# Patient Record
Sex: Female | Born: 1960 | Race: White | Hispanic: No | Smoking: Former smoker
Health system: Southern US, Community
[De-identification: ages and names within clinical notes are randomized; demographics above are authoritative.]

## PROBLEM LIST (undated history)

## (undated) DIAGNOSIS — E871 Hypo-osmolality and hyponatremia: Secondary | ICD-10-CM

## (undated) DIAGNOSIS — T8859XA Other complications of anesthesia, initial encounter: Secondary | ICD-10-CM

## (undated) DIAGNOSIS — M8580 Other specified disorders of bone density and structure, unspecified site: Secondary | ICD-10-CM

## (undated) DIAGNOSIS — K259 Gastric ulcer, unspecified as acute or chronic, without hemorrhage or perforation: Secondary | ICD-10-CM

## (undated) DIAGNOSIS — I8390 Asymptomatic varicose veins of unspecified lower extremity: Secondary | ICD-10-CM

## (undated) DIAGNOSIS — E559 Vitamin D deficiency, unspecified: Secondary | ICD-10-CM

## (undated) DIAGNOSIS — Z8589 Personal history of malignant neoplasm of other organs and systems: Secondary | ICD-10-CM

## (undated) DIAGNOSIS — F329 Major depressive disorder, single episode, unspecified: Secondary | ICD-10-CM

## (undated) DIAGNOSIS — Z8489 Family history of other specified conditions: Secondary | ICD-10-CM

## (undated) DIAGNOSIS — F32A Depression, unspecified: Secondary | ICD-10-CM

## (undated) DIAGNOSIS — E785 Hyperlipidemia, unspecified: Secondary | ICD-10-CM

## (undated) DIAGNOSIS — Z860100 Personal history of colon polyps, unspecified: Secondary | ICD-10-CM

## (undated) DIAGNOSIS — M797 Fibromyalgia: Secondary | ICD-10-CM

## (undated) DIAGNOSIS — N952 Postmenopausal atrophic vaginitis: Secondary | ICD-10-CM

## (undated) DIAGNOSIS — M722 Plantar fascial fibromatosis: Secondary | ICD-10-CM

## (undated) DIAGNOSIS — Z720 Tobacco use: Secondary | ICD-10-CM

## (undated) DIAGNOSIS — D126 Benign neoplasm of colon, unspecified: Secondary | ICD-10-CM

## (undated) DIAGNOSIS — I700 Atherosclerosis of aorta: Secondary | ICD-10-CM

## (undated) DIAGNOSIS — C8590 Non-Hodgkin lymphoma, unspecified, unspecified site: Secondary | ICD-10-CM

## (undated) DIAGNOSIS — J45909 Unspecified asthma, uncomplicated: Secondary | ICD-10-CM

## (undated) DIAGNOSIS — F419 Anxiety disorder, unspecified: Secondary | ICD-10-CM

## (undated) DIAGNOSIS — K6389 Other specified diseases of intestine: Secondary | ICD-10-CM

## (undated) DIAGNOSIS — M1990 Unspecified osteoarthritis, unspecified site: Secondary | ICD-10-CM

## (undated) DIAGNOSIS — K5790 Diverticulosis of intestine, part unspecified, without perforation or abscess without bleeding: Secondary | ICD-10-CM

## (undated) DIAGNOSIS — Z8719 Personal history of other diseases of the digestive system: Secondary | ICD-10-CM

## (undated) DIAGNOSIS — J449 Chronic obstructive pulmonary disease, unspecified: Secondary | ICD-10-CM

## (undated) DIAGNOSIS — I10 Essential (primary) hypertension: Secondary | ICD-10-CM

## (undated) DIAGNOSIS — J387 Other diseases of larynx: Secondary | ICD-10-CM

## (undated) DIAGNOSIS — G4700 Insomnia, unspecified: Secondary | ICD-10-CM

## (undated) DIAGNOSIS — U071 COVID-19: Secondary | ICD-10-CM

## (undated) DIAGNOSIS — Z87442 Personal history of urinary calculi: Secondary | ICD-10-CM

## (undated) DIAGNOSIS — J439 Emphysema, unspecified: Secondary | ICD-10-CM

## (undated) DIAGNOSIS — N182 Chronic kidney disease, stage 2 (mild): Secondary | ICD-10-CM

## (undated) DIAGNOSIS — M542 Cervicalgia: Secondary | ICD-10-CM

## (undated) DIAGNOSIS — K219 Gastro-esophageal reflux disease without esophagitis: Secondary | ICD-10-CM

## (undated) DIAGNOSIS — F909 Attention-deficit hyperactivity disorder, unspecified type: Secondary | ICD-10-CM

## (undated) DIAGNOSIS — T7840XA Allergy, unspecified, initial encounter: Secondary | ICD-10-CM

## (undated) DIAGNOSIS — R0600 Dyspnea, unspecified: Secondary | ICD-10-CM

## (undated) DIAGNOSIS — M810 Age-related osteoporosis without current pathological fracture: Secondary | ICD-10-CM

## (undated) HISTORY — DX: Diverticulosis of intestine, part unspecified, without perforation or abscess without bleeding: K57.90

## (undated) HISTORY — DX: Vitamin D deficiency, unspecified: E55.9

## (undated) HISTORY — PX: ABDOMINAL HYSTERECTOMY: SHX81

## (undated) HISTORY — DX: Personal history of colon polyps, unspecified: Z86.0100

## (undated) HISTORY — DX: Personal history of colonic polyps: Z86.010

## (undated) HISTORY — PX: TONSILLECTOMY: SHX5217

## (undated) HISTORY — DX: Plantar fascial fibromatosis: M72.2

## (undated) HISTORY — PX: CHOLECYSTECTOMY: SHX55

## (undated) HISTORY — DX: Other specified diseases of intestine: K63.89

## (undated) HISTORY — DX: COVID-19: U07.1

## (undated) HISTORY — DX: Other diseases of larynx: J38.7

## (undated) HISTORY — DX: Non-Hodgkin lymphoma, unspecified, unspecified site: C85.90

## (undated) HISTORY — DX: Unspecified osteoarthritis, unspecified site: M19.90

## (undated) HISTORY — DX: Essential (primary) hypertension: I10

## (undated) HISTORY — PX: APPENDECTOMY: SHX54

## (undated) HISTORY — DX: Anxiety disorder, unspecified: F41.9

## (undated) HISTORY — DX: Hyperlipidemia, unspecified: E78.5

## (undated) HISTORY — PX: KNEE ARTHROSCOPY: SHX127

## (undated) HISTORY — DX: Age-related osteoporosis without current pathological fracture: M81.0

## (undated) HISTORY — PX: LYMPH NODE BIOPSY: SHX201

## (undated) HISTORY — DX: Postmenopausal atrophic vaginitis: N95.2

## (undated) HISTORY — PX: UPPER GASTROINTESTINAL ENDOSCOPY: SHX188

## (undated) HISTORY — DX: Atherosclerosis of aorta: I70.0

## (undated) HISTORY — DX: Tobacco use: Z72.0

## (undated) HISTORY — DX: Chronic kidney disease, stage 2 (mild): N18.2

## (undated) HISTORY — DX: Cervicalgia: M54.2

## (undated) HISTORY — DX: Other specified disorders of bone density and structure, unspecified site: M85.80

## (undated) HISTORY — DX: Emphysema, unspecified: J43.9

## (undated) HISTORY — PX: WRIST SURGERY: SHX841

## (undated) HISTORY — DX: Personal history of malignant neoplasm of other organs and systems: Z85.89

## (undated) HISTORY — DX: Benign neoplasm of colon, unspecified: D12.6

## (undated) HISTORY — DX: Major depressive disorder, single episode, unspecified: F32.9

## (undated) HISTORY — PX: BILATERAL SALPINGOOPHORECTOMY: SHX1223

## (undated) HISTORY — DX: Asymptomatic varicose veins of unspecified lower extremity: I83.90

## (undated) HISTORY — DX: Hypo-osmolality and hyponatremia: E87.1

## (undated) HISTORY — DX: Insomnia, unspecified: G47.00

## (undated) HISTORY — DX: Chronic obstructive pulmonary disease, unspecified: J44.9

## (undated) HISTORY — DX: Gastric ulcer, unspecified as acute or chronic, without hemorrhage or perforation: K25.9

## (undated) HISTORY — DX: Personal history of other diseases of the digestive system: Z87.19

## (undated) HISTORY — DX: Depression, unspecified: F32.A

## (undated) HISTORY — DX: Gastro-esophageal reflux disease without esophagitis: K21.9

---

## 2005-02-16 ENCOUNTER — Emergency Department (HOSPITAL_COMMUNITY): Admission: EM | Admit: 2005-02-16 | Discharge: 2005-02-17 | Payer: Self-pay | Admitting: Emergency Medicine

## 2005-08-26 ENCOUNTER — Emergency Department (HOSPITAL_COMMUNITY): Admission: EM | Admit: 2005-08-26 | Discharge: 2005-08-26 | Payer: Self-pay | Admitting: Emergency Medicine

## 2006-04-10 ENCOUNTER — Emergency Department (HOSPITAL_COMMUNITY): Admission: EM | Admit: 2006-04-10 | Discharge: 2006-04-10 | Payer: Self-pay | Admitting: Emergency Medicine

## 2007-06-27 ENCOUNTER — Emergency Department (HOSPITAL_COMMUNITY): Admission: EM | Admit: 2007-06-27 | Discharge: 2007-06-27 | Payer: Self-pay | Admitting: Emergency Medicine

## 2007-10-25 ENCOUNTER — Emergency Department (HOSPITAL_COMMUNITY): Admission: EM | Admit: 2007-10-25 | Discharge: 2007-10-25 | Payer: Self-pay | Admitting: Emergency Medicine

## 2007-10-27 ENCOUNTER — Ambulatory Visit (HOSPITAL_COMMUNITY): Admission: RE | Admit: 2007-10-27 | Discharge: 2007-10-29 | Payer: Self-pay | Admitting: Orthopedic Surgery

## 2007-11-01 ENCOUNTER — Encounter: Admission: RE | Admit: 2007-11-01 | Discharge: 2008-01-01 | Payer: Self-pay | Admitting: Orthopedic Surgery

## 2009-01-29 ENCOUNTER — Emergency Department (HOSPITAL_COMMUNITY): Admission: EM | Admit: 2009-01-29 | Discharge: 2009-01-29 | Payer: Self-pay | Admitting: Emergency Medicine

## 2009-12-18 ENCOUNTER — Emergency Department (HOSPITAL_COMMUNITY): Admission: EM | Admit: 2009-12-18 | Discharge: 2009-12-18 | Payer: Self-pay | Admitting: Emergency Medicine

## 2010-01-06 ENCOUNTER — Encounter: Admission: RE | Admit: 2010-01-06 | Discharge: 2010-01-06 | Payer: Self-pay | Admitting: Family Medicine

## 2010-08-24 ENCOUNTER — Ambulatory Visit
Admission: RE | Admit: 2010-08-24 | Discharge: 2010-08-24 | Disposition: A | Discharge status: Home / Self Care | Payer: PRIVATE HEALTH INSURANCE | Source: Ambulatory Visit | Attending: Family Medicine | Admitting: Family Medicine

## 2010-08-24 ENCOUNTER — Other Ambulatory Visit: Payer: Self-pay | Admitting: Family Medicine

## 2010-08-24 DIAGNOSIS — R1031 Right lower quadrant pain: Secondary | ICD-10-CM

## 2010-08-24 MED ORDER — IOHEXOL 300 MG/ML  SOLN
100.0000 mL | Freq: Once | INTRAMUSCULAR | Status: AC | PRN
  Administered 2010-08-24: 100 mL via INTRAVENOUS

## 2010-09-19 LAB — COMPREHENSIVE METABOLIC PANEL
ALT: 11 U/L (ref 0–35)
AST: 18 U/L (ref 0–37)
Albumin: 3.9 g/dL (ref 3.5–5.2)
Alkaline Phosphatase: 79 U/L (ref 39–117)
BUN: 13 mg/dL (ref 6–23)
CO2: 23 mEq/L (ref 19–32)
Calcium: 9 mg/dL (ref 8.4–10.5)
Chloride: 105 mEq/L (ref 96–112)
Creatinine, Ser: 0.75 mg/dL (ref 0.4–1.2)
GFR calc Af Amer: >60 mL/min (ref >60)
GFR calc non Af Amer: >60 mL/min (ref >60)
Glucose, Bld: 93 mg/dL (ref 70–99)
Potassium: 3.9 mEq/L (ref 3.5–5.1)
Sodium: 134 mEq/L — ABNORMAL LOW (ref 135–145)
Total Bilirubin: 0.4 mg/dL (ref 0.3–1.2)
Total Protein: 6.9 g/dL (ref 6.0–8.3)

## 2010-09-19 LAB — DIFFERENTIAL
Basophils Absolute: 0 10*3/uL (ref 0.0–0.1)
Basophils Relative: 0 % (ref 0–1)
Eosinophils Absolute: 0.2 10*3/uL (ref 0.0–0.7)
Eosinophils Relative: 2 % (ref 0–5)
Lymphocytes Relative: 21 % (ref 12–46)
Lymphs Abs: 2.1 10*3/uL (ref 0.7–4.0)
Monocytes Absolute: 0.5 10*3/uL (ref 0.1–1.0)
Monocytes Relative: 5 % (ref 3–12)
Neutro Abs: 7.5 10*3/uL (ref 1.7–7.7)
Neutrophils Relative %: 73 % (ref 43–77)

## 2010-09-19 LAB — POCT CARDIAC MARKERS
CKMB, poc: <1 ng/mL — ABNORMAL LOW (ref 1.0–8.0)
Myoglobin, poc: 32.9 ng/mL (ref 12–200)
Troponin i, poc: <0.05 ng/mL (ref 0.00–0.09)

## 2010-09-19 LAB — URINALYSIS, ROUTINE W REFLEX MICROSCOPIC
Bilirubin Urine: NEGATIVE
Glucose, UA: NEGATIVE mg/dL
Ketones, ur: NEGATIVE mg/dL
Nitrite: NEGATIVE
Protein, ur: NEGATIVE mg/dL
Specific Gravity, Urine: 1.003 — ABNORMAL LOW (ref 1.005–1.030)
Urobilinogen, UA: 0.2 mg/dL (ref 0.0–1.0)
pH: 7 (ref 5.0–8.0)

## 2010-09-19 LAB — D-DIMER, QUANTITATIVE: D-Dimer, Quant: 0.41 ug/mL-FEU (ref 0.00–0.48)

## 2010-09-19 LAB — CBC
HCT: 42.2 % (ref 36.0–46.0)
Hemoglobin: 14.4 g/dL (ref 12.0–15.0)
MCHC: 34.2 g/dL (ref 30.0–36.0)
MCV: 94.4 fL (ref 78.0–100.0)
Platelets: 188 10*3/uL (ref 150–400)
RBC: 4.47 MIL/uL (ref 3.87–5.11)
RDW: 12.9 % (ref 11.5–15.5)
WBC: 10.4 10*3/uL (ref 4.0–10.5)

## 2010-09-19 LAB — URINE MICROSCOPIC-ADD ON

## 2010-09-19 LAB — URINE CULTURE: Colony Count: 50000

## 2010-10-27 NOTE — Op Note (Signed)
NAME:  Emily Santiago, Emily Santiago NO.:  [id number]   MEDICAL RECORD NO.:  [id number]          PATIENT TYPE:  OIB   LOCATION:  5028                         FACILITY:  [facility]   PHYSICIAN:  Artist Pais. Weingold, M.D.DATE OF BIRTH:  [birth year]   DATE OF PROCEDURE:  10/27/2007  DATE OF DISCHARGE:                               OPERATIVE REPORT   PREOPERATIVE DIAGNOSIS:  Displaced intra-articular distal radius  fracture of right wrist with carpal tunnel symptoms.   POSTOPERATIVE DIAGNOSIS:  Displaced intra-articular distal radius  fracture of right wrist with carpal tunnel symptoms.   PROCEDURE:  Open reduction and internal fixation of above using DVR  plate and screws and carpal tunnel release.   SURGEON:  Artist Pais. Mina Marble, M.D.   ASSISTANT:  None.   ANESTHESIA:  General.   TOURNIQUET TIME:  48 minutes.   COMPLICATIONS:  None.   DRAINS:  None.   OPERATIVE PROCEDURE:  The patient was taken to the operating suite.  After the induction of adequate general anesthesia, the right upper  extremity was prepped and draped in the usual sterile fashion.  An  Esmarch was used to exsanguinate the limb.  Tourniquet was inflated to  250 mmHg.  At this point in time, a longitudinal incision was made over  the palmar aspect of the distal forearm and wrist area.  Over the  palpable tendon of the flexor carpi radialis, the skin was incised.  The  sheath over the FCR was incised and retracted the midline of the radial  artery to the lateral side.  This interval was developed down to the  level of the pronator quadratus.  This was carefully subperiosteally  stripped off the distal radius, thus exposing the fracture site.  There  was an impacted articular fracture of the distal radius that was reduced  with a slight bit of longitudinal traction, flexion, and ulnar deviation  as well as placing the Freer elevator into the fracture site elevated,  depressed scaphoid facet fragment.  After  this was done, the DVR plate  was passed into the lower aspect of the distal radius and despite a hole  on the plate fixed with a cortical screw.  Intraoperative fluoroscopy  eventually revealed adequate reduction in both the AP lateral and  oblique view.  After this was done, the wound was irrigated.  The  remaining 3 cortical screws were placed in the 7 smooth pegs distally.  Intraoperative fluoroscopy then revealed near-anatomic reduction in both  the AP lateral and oblique views.  The wound was then thoroughly  irrigated.  The proximal aspect of the carpal tunnel was identified.  Dissection was carried out using a Therapist, nutritional, palmar and dorsal to  this ligament.  The Freer elevator was then left in the carpal canal,  and the transverse carpal ligament was divided from proximal to distal  using a curved blunt scissor.  The canal was inspected.  There was some  blood.  This was irrigated out.  The wound was then thoroughly  irrigated and loosely closed in layers of 3-0 undyed Vicryl to repair  the  pronator quadratus followed by 3-0 Prolene subcuticular stitch on  the skin.  Steri-Strips, 4x4s, fluffs, and a volar splint was applied.  The patient tolerated the procedures well and went to the recovery room  in stable fashion.      Artist Pais Mina Marble, M.D.  Electronically Signed     MAW/MEDQ  D:  10/27/2007  T:  10/28/2007  Job:  [id number]

## 2011-03-10 LAB — CBC
HCT: 42.7
Hemoglobin: 14.7
MCHC: 34.4
MCV: 93.4
Platelets: 183
RBC: 4.57
RDW: 12.9
WBC: 6.9

## 2011-03-10 LAB — BASIC METABOLIC PANEL
BUN: 10
CO2: 26
Calcium: 9.3
Chloride: 107
Creatinine, Ser: 0.63
GFR calc Af Amer: >60
GFR calc non Af Amer: >60
Glucose, Bld: 82
Potassium: 4.2
Sodium: 142

## 2012-06-28 ENCOUNTER — Other Ambulatory Visit: Payer: Self-pay | Admitting: Family Medicine

## 2012-06-28 DIAGNOSIS — R319 Hematuria, unspecified: Secondary | ICD-10-CM

## 2012-06-28 DIAGNOSIS — R109 Unspecified abdominal pain: Secondary | ICD-10-CM

## 2012-06-30 ENCOUNTER — Ambulatory Visit
Admission: RE | Admit: 2012-06-30 | Discharge: 2012-06-30 | Disposition: A | Discharge status: Home / Self Care | Payer: BC Managed Care – PPO | Source: Ambulatory Visit | Attending: Family Medicine | Admitting: Family Medicine

## 2012-06-30 DIAGNOSIS — R109 Unspecified abdominal pain: Secondary | ICD-10-CM

## 2012-06-30 DIAGNOSIS — R319 Hematuria, unspecified: Secondary | ICD-10-CM

## 2012-06-30 MED ORDER — IOHEXOL 300 MG/ML  SOLN
125.0000 mL | Freq: Once | INTRAMUSCULAR | Status: AC | PRN
  Administered 2012-06-30: 125 mL via INTRAVENOUS

## 2013-01-23 ENCOUNTER — Other Ambulatory Visit: Payer: Self-pay | Admitting: *Deleted

## 2013-01-23 DIAGNOSIS — I83893 Varicose veins of bilateral lower extremities with other complications: Secondary | ICD-10-CM

## 2013-01-25 ENCOUNTER — Encounter: Payer: Self-pay | Admitting: Vascular Surgery

## 2013-02-23 ENCOUNTER — Encounter: Payer: Self-pay | Admitting: Vascular Surgery

## 2013-02-26 ENCOUNTER — Ambulatory Visit (INDEPENDENT_AMBULATORY_CARE_PROVIDER_SITE_OTHER): Payer: BC Managed Care – PPO | Admitting: Vascular Surgery

## 2013-02-26 ENCOUNTER — Encounter: Payer: Self-pay | Admitting: Vascular Surgery

## 2013-02-26 ENCOUNTER — Encounter (INDEPENDENT_AMBULATORY_CARE_PROVIDER_SITE_OTHER): Payer: BC Managed Care – PPO

## 2013-02-26 VITALS — BP 145/93 | HR 76 | Resp 16 | Ht 62.0 in | Wt 143.0 lb

## 2013-02-26 DIAGNOSIS — I83893 Varicose veins of bilateral lower extremities with other complications: Secondary | ICD-10-CM

## 2013-02-26 DIAGNOSIS — M79609 Pain in unspecified limb: Secondary | ICD-10-CM

## 2013-02-26 NOTE — Progress Notes (Signed)
Subjective:     Patient ID: Emily Santiago, female   DOB: February 09, 1961, 52 y.o.   MRN: [id number]  HPI this 52 year old female returns with bilateral venous insufficiency and painful varicosities. She is noted to varicosities for the past several years but they have enlarge and become more painful causing aching throbbing and burning discomfort. She has no history of DVT or thrombophlebitis. She does not wear elastic compression stockings. She does get swelling in the ankles of both feet as the day progresses and the symptoms are similar in the right and left legs mostly in the thigh and calf areas. She's noticed some thickening of the skin in both ankles.  Past Medical History  Diagnosis Date  . Hypertension   . Varicose veins   . Anxiety     History  Substance Use Topics  . Smoking status: Current Every Day Smoker -- 0.50 packs/day    Types: Cigarettes  . Smokeless tobacco: Not on file  . Alcohol Use: No    No family history on file.  Allergies  Allergen Reactions  . Lisinopril Cough  . Penicillins     Stomach upset    Current outpatient prescriptions:estradiol (ESTRACE) 0.5 MG tablet, Take 0.5 mg by mouth daily., Disp: , Rfl: ;  hydrochlorothiazide (HYDRODIURIL) 12.5 MG tablet, Take 12.5 mg by mouth daily., Disp: , Rfl: ;  LOSARTAN POTASSIUM PO, Take 100 mg by mouth daily., Disp: , Rfl: ;  rOPINIRole (REQUIP) 1 MG tablet, Take 1 mg by mouth daily., Disp: , Rfl: ;  varenicline (CHANTIX) 0.5 MG tablet, Take 0.5 mg by mouth as directed., Disp: , Rfl:   BP 145/93  Pulse 76  Resp 16  Ht 5\' 2"  (1.575 m)  Wt 143 lb (64.864 kg)  BMI 26.15 kg/m2  Body mass index is 26.15 kg/(m^2).           Review of Systems denies chest pain, dyspnea on exertion, PND, orthopnea, hemoptysis, claudication. Does have some weakness in her arms and legs and occasional pain in her legs while lying flat. All other systems are negative and complete review of systems     Objective:   Physical Exam  BP 145/93  Pulse 76  Resp 16  Ht 5\' 2"  (1.575 m)  Wt 143 lb (64.864 kg)  BMI 26.15 kg/m2  Gen.-alert and oriented x3 in no apparent distress HEENT normal for age Lungs no rhonchi or wheezing Cardiovascular regular rhythm no murmurs carotid pulses 3+ palpable no bruits audible Abdomen soft nontender no palpable masses Musculoskeletal free of  major deformities Skin clear -no rashes Neurologic normal Lower extremities 3+ femoral and dorsalis pedis pulses palpable bilaterally with 1+ edema bilaterally Right leg with bulging varicosities in the medial calf and extending down into the right ankle area with significant hyperpigmentation and reticular veins in this area no active ulcers Left leg with bulging varicosities in the medial thigh and medial calf with no active ulcer in early hyperpigmentation.  Today I ordered bilateral venous duplex exam which I reviewed and interpreted. There is no DVT. Both great saphenous systems had severe reflux from the mid calf to the saphenofemoral junction. There is some mild reflux in the deep system but no reflux in the small saphenous veins on the right but there is reflux in left small saphenous vein       Assessment:     Severe bilateral venous insufficiency with gross reflux in the right great saphenous, left great saphenous, and left small saphenous veins with  pain which is affecting patient's daily living and ability to work    Plan:     #1 long-leg elastic compression stockings 20-30 mm gradient #2 elevate legs as much as possible during the day as much as work will allow #3 ibuprofen on a daily basis #4 return in 3 months. If no significant improvement patient needs #1 laser ablation right great saphenous vein with greater than 20 stab phlebectomy followed by #2 laser ablation left great saphenous vein and #3 laser ablation left small saphenous vein with greater than 20 stab phlebectomy with one of those 2 procedures

## 2013-03-16 ENCOUNTER — Encounter: Payer: BC Managed Care – PPO | Admitting: Vascular Surgery

## 2013-05-16 ENCOUNTER — Other Ambulatory Visit: Payer: Self-pay

## 2013-05-16 DIAGNOSIS — Z1231 Encounter for screening mammogram for malignant neoplasm of breast: Secondary | ICD-10-CM

## 2013-05-28 ENCOUNTER — Encounter: Payer: Self-pay | Admitting: Vascular Surgery

## 2013-05-29 ENCOUNTER — Ambulatory Visit (INDEPENDENT_AMBULATORY_CARE_PROVIDER_SITE_OTHER): Payer: BC Managed Care – PPO | Admitting: Vascular Surgery

## 2013-05-29 ENCOUNTER — Encounter: Payer: Self-pay | Admitting: Vascular Surgery

## 2013-05-29 VITALS — BP 138/81 | HR 68 | Resp 16 | Ht 61.0 in | Wt 142.0 lb

## 2013-05-29 DIAGNOSIS — I83893 Varicose veins of bilateral lower extremities with other complications: Secondary | ICD-10-CM

## 2013-05-29 NOTE — Progress Notes (Signed)
Subjective:     Patient ID: Emily Santiago, female   DOB: August 09, 1960, 52 y.o.   MRN: [id number]  HPI this 52 year old female returns for continued followup regarding her severe bilateral venous insufficiency with painful varicosities. She continues to have aching throbbing and burning discomfort in both legs right equal to left despite wearing long leg elastic compression stockings 20-30 mm gradient in trying ibuprofen and elevation. She works as a Engineer, civil (consulting) and  stands on her feet most of the day so is unable to elevate the legs. Symptoms can become worse as the day progresses.  Past Medical History  Diagnosis Date  . Hypertension   . Varicose veins   . Anxiety     History  Substance Use Topics  . Smoking status: Current Every Day Smoker -- 0.50 packs/day    Types: Cigarettes  . Smokeless tobacco: Not on file  . Alcohol Use: No    No family history on file.  Allergies  Allergen Reactions  . Lisinopril Cough  . Penicillins     Stomach upset    Current outpatient prescriptions:estradiol (ESTRACE) 0.5 MG tablet, Take 0.5 mg by mouth daily., Disp: , Rfl: ;  hydrochlorothiazide (HYDRODIURIL) 12.5 MG tablet, Take 12.5 mg by mouth daily., Disp: , Rfl: ;  LOSARTAN POTASSIUM PO, Take 100 mg by mouth daily., Disp: , Rfl: ;  rOPINIRole (REQUIP) 1 MG tablet, Take 1 mg by mouth daily., Disp: , Rfl: ;  varenicline (CHANTIX) 0.5 MG tablet, Take 0.5 mg by mouth as directed., Disp: , Rfl:   BP 138/81  Pulse 68  Resp 16  Ht 5\' 1"  (1.549 m)  Wt 142 lb (64.411 kg)  BMI 26.84 kg/m2  Body mass index is 26.84 kg/(m^2).         Review of Systems denies chest pain, dyspnea on exertion, PND, orthopnea, hemoptysis    Objective:   Physical Exam BP 138/81  Pulse 68  Resp 16  Ht 5\' 1"  (1.549 m)  Wt 142 lb (64.411 kg)  BMI 26.84 kg/m2  General well-developed well-nourished female in no apparent stress alert and oriented x3 Lungs no rhonchi or wheezing Both lower extremities have bulging  varicosities in the medial calf and distal thigh extending posteriorly worse on the left than the right. 1+ edema distally. No active ulceration noted. 3 posterior cells pedis pulse palpable.     Assessment:     Severe bilateral venous insufficiency with painful varicosities due to gross reflux bilateral great saphenous veins in left small saphenous vein. Symptoms are affecting patient's daily living and ability to work and resistant to conservative measures    Plan:     Patient needs #1 laser ablation right great saphenous vein with 10-20 stab phlebectomy followed by #2 laser ablation left great saphenous vein followed by #3 laser ablation left small saphenous vein with 10-20 stab phlebectomy Will proceed with precertification to perform this in the near future

## 2013-06-12 ENCOUNTER — Other Ambulatory Visit: Payer: Self-pay | Admitting: *Deleted

## 2013-06-12 DIAGNOSIS — I83893 Varicose veins of bilateral lower extremities with other complications: Secondary | ICD-10-CM

## 2013-06-15 ENCOUNTER — Ambulatory Visit
Admission: RE | Admit: 2013-06-15 | Discharge: 2013-06-15 | Disposition: A | Discharge status: Home / Self Care | Payer: BC Managed Care – PPO | Source: Ambulatory Visit

## 2013-06-15 ENCOUNTER — Other Ambulatory Visit: Payer: Self-pay | Admitting: Family Medicine

## 2013-06-15 DIAGNOSIS — Z1231 Encounter for screening mammogram for malignant neoplasm of breast: Secondary | ICD-10-CM

## 2013-06-15 DIAGNOSIS — N6320 Unspecified lump in the left breast, unspecified quadrant: Secondary | ICD-10-CM

## 2013-06-21 ENCOUNTER — Ambulatory Visit
Admission: RE | Admit: 2013-06-21 | Discharge: 2013-06-21 | Disposition: A | Discharge status: Home / Self Care | Payer: BC Managed Care – PPO | Source: Ambulatory Visit | Attending: Family Medicine | Admitting: Family Medicine

## 2013-06-21 DIAGNOSIS — N6320 Unspecified lump in the left breast, unspecified quadrant: Secondary | ICD-10-CM

## 2013-06-29 ENCOUNTER — Encounter: Payer: Self-pay | Admitting: Vascular Surgery

## 2013-07-02 ENCOUNTER — Encounter: Payer: Self-pay | Admitting: Vascular Surgery

## 2013-07-02 ENCOUNTER — Other Ambulatory Visit: Payer: BC Managed Care – PPO | Admitting: Vascular Surgery

## 2013-07-02 ENCOUNTER — Ambulatory Visit (INDEPENDENT_AMBULATORY_CARE_PROVIDER_SITE_OTHER): Payer: BC Managed Care – PPO | Admitting: Vascular Surgery

## 2013-07-02 ENCOUNTER — Encounter (INDEPENDENT_AMBULATORY_CARE_PROVIDER_SITE_OTHER): Payer: Self-pay

## 2013-07-02 VITALS — BP 169/92 | HR 87 | Resp 16 | Ht 61.0 in | Wt 144.0 lb

## 2013-07-02 DIAGNOSIS — I83893 Varicose veins of bilateral lower extremities with other complications: Secondary | ICD-10-CM

## 2013-07-02 NOTE — Progress Notes (Signed)
Subjective:     Patient ID: Emily Santiago, female   DOB: 1960-09-12, 53 y.o.   MRN: [id number]  HPI this 53 year old female had laser ablation right great saphenous vein and 10-20 stab phlebectomy of painful varicosities performed under local tumescent anesthesia. A total of 2600 J of energy was utilized. She tolerated the procedure well.   Review of Systems     Objective:   Physical Exam BP 169/92  Pulse 87  Resp 16  Ht 5\' 1"  (1.549 m)  Wt 144 lb (65.318 kg)  BMI 27.22 kg/m2        Assessment:     Well tolerated laser ablation right great saphenous vein with multiple stab phlebectomy for painful varicosities performed under local tumescent anesthesia    Plan:     Return in one week for venous duplex exam to confirm closure right great saphenous vein

## 2013-07-02 NOTE — Progress Notes (Signed)
   Laser Ablation Procedure      Date: 07/02/2013    Emily Santiago DOB:12-Apr-1961  Consent signed: Yes  Surgeon:J.D. Kellie Simmering  Procedure: Laser Ablation: right Greater Saphenous Vein  BP 169/92  Pulse 87  Resp 16  Ht 5\' 1"  (1.549 m)  Wt 144 lb (65.318 kg)  BMI 27.22 kg/m2  Start time: 2:55   End time: 3:50  Tumescent Anesthesia: 375 cc 0.9% NaCl with 50 cc Lidocaine HCL with 1% Epi and 15 cc 8.4% NaHCO3  Local Anesthesia: 6 cc Lidocaine HCL and NaHCO3 (ratio 2:1)  Pulsed mode: 15 watts, 500 ms delay, 1.0 duration Total energy: 2604, total pulses: 174, total time: 2:54     Stab Phlebectomy: 10-20 Sites: Calf  Patient tolerated procedure well: Yes  Notes:   Description of Procedure:  After marking the course of the saphenous vein and the secondary varicosities in the standing position, the patient was placed on the operating table in the supine position, and the right leg was prepped and draped in sterile fashion. Local anesthetic was administered, and under ultrasound guidance the saphenous vein was accessed with a micro needle and guide wire; then the micro puncture sheath was placed. A guide wire was inserted to the saphenofemoral junction, followed by a 5 french sheath.  The position of the sheath and then the laser fiber below the junction was confirmed using the ultrasound and visualization of the aiming beam.  Tumescent anesthesia was administered along the course of the saphenous vein using ultrasound guidance. Protective laser glasses were placed on the patient, and the laser was fired at 15 watt pulsed mode advancing 1-2 mm per sec.  For a total of 2604 joules.  A steri strip was applied to the puncture site.  The patient was then put into Trendelenburg position.  Local anesthetic was utilized overlying the marked varicosities.  Ten to 20 stab wounds were made using the tip of an 11 blade; and using the vein hook,  The phlebectomies were performed using a hemostat to  avulse these varicosities.  Adequate hemostasis was achieved, and steri strips were applied to the stab wound.      ABD pads and thigh high compression stockings were applied.  Ace wrap bandages were applied over the phlebectomy sites and at the top of the saphenofemoral junction.  Blood loss was less than 15 cc.  The patient ambulated out of the operating room having tolerated the procedure well.

## 2013-07-03 ENCOUNTER — Encounter: Payer: Self-pay | Admitting: Vascular Surgery

## 2013-07-06 ENCOUNTER — Encounter: Payer: Self-pay | Admitting: Vascular Surgery

## 2013-07-09 ENCOUNTER — Ambulatory Visit (INDEPENDENT_AMBULATORY_CARE_PROVIDER_SITE_OTHER): Payer: Self-pay | Admitting: Vascular Surgery

## 2013-07-09 ENCOUNTER — Ambulatory Visit (HOSPITAL_COMMUNITY)
Admission: RE | Admit: 2013-07-09 | Discharge: 2013-07-09 | Disposition: A | Discharge status: Home / Self Care | Payer: BC Managed Care – PPO | Source: Ambulatory Visit | Attending: Vascular Surgery | Admitting: Vascular Surgery

## 2013-07-09 ENCOUNTER — Encounter: Payer: Self-pay | Admitting: Vascular Surgery

## 2013-07-09 VITALS — BP 162/90 | HR 75 | Resp 16 | Ht 61.0 in | Wt 144.0 lb

## 2013-07-09 DIAGNOSIS — I83893 Varicose veins of bilateral lower extremities with other complications: Secondary | ICD-10-CM

## 2013-07-09 NOTE — Progress Notes (Signed)
Subjective:     Patient ID: Emily Santiago, female   DOB: Jun 05, 1961, 53 y.o.   MRN: [id number]  HPI this 53 year old female returns 1 week post laser ablation right great saphenous vein with multiple septal Bechterew painful varicosities. She has had a moderate amount of discomfort in the mid and proximal thigh over the great saphenous vein as one would expect. She's had no distal edema. Stab phlebectomy sites are not causing pain. She is wearing elastic compression stocking as instructed in taking ibuprofen.  Past Medical History  Diagnosis Date  . Hypertension   . Varicose veins   . Anxiety     History  Substance Use Topics  . Smoking status: Current Every Day Smoker -- 0.50 packs/day    Types: Cigarettes  . Smokeless tobacco: Not on file  . Alcohol Use: No    No family history on file.  Allergies  Allergen Reactions  . Lisinopril Cough  . Penicillins     Stomach upset    Current outpatient prescriptions:estradiol (ESTRACE) 0.5 MG tablet, Take 0.5 mg by mouth daily., Disp: , Rfl: ;  hydrochlorothiazide (HYDRODIURIL) 12.5 MG tablet, Take 12.5 mg by mouth daily., Disp: , Rfl: ;  LOSARTAN POTASSIUM PO, Take 100 mg by mouth daily., Disp: , Rfl: ;  rOPINIRole (REQUIP) 1 MG tablet, Take 1 mg by mouth daily., Disp: , Rfl: ;  varenicline (CHANTIX) 0.5 MG tablet, Take 0.5 mg by mouth as directed., Disp: , Rfl:   BP 162/90  Pulse 75  Resp 16  Ht 5\' 1"  (1.549 m)  Wt 144 lb (65.318 kg)  BMI 27.22 kg/m2  Body mass index is 27.22 kg/(m^2).           Review of Systems denies chest pain, dyspnea on exertion, PND, orthopnea, hemoptysis. Patient reports that she has quit smoking 3 weeks ago     Objective:   Physical Exam BP 162/90  Pulse 75  Resp 16  Ht 5\' 1"  (1.549 m)  Wt 144 lb (65.318 kg)  BMI 27.22 kg/m2  General well-developed well-nourished female no apparent stress alert and oriented x3 Right leg with moderate ecchymosis over great saphenous vein with mild to  moderate tenderness to palpation. Saphenectomy sites healing well. 3 posterior cells pedis pulse palpable right foot.  The ordered a venous duplex exam the right leg which are reviewed and interpreted. The great saphenous vein is totally closed from the proximal calf to near the saphenofemoral junction and there is no DVT     Assessment:     Successful laser ablation right great saphenous vein with multiple stab phlebectomy for painful symptomatic varicosities    Plan:     Return next week for laser ablation left great saphenous vein to be followed by laser ablation left small saphenous vein with 10-20 stab phlebectomy

## 2013-07-13 ENCOUNTER — Encounter: Payer: Self-pay | Admitting: Vascular Surgery

## 2013-07-16 ENCOUNTER — Other Ambulatory Visit: Payer: BC Managed Care – PPO | Admitting: Vascular Surgery

## 2013-07-16 ENCOUNTER — Ambulatory Visit (INDEPENDENT_AMBULATORY_CARE_PROVIDER_SITE_OTHER): Payer: BC Managed Care – PPO | Admitting: Vascular Surgery

## 2013-07-16 ENCOUNTER — Encounter: Payer: Self-pay | Admitting: Vascular Surgery

## 2013-07-16 VITALS — BP 160/84 | HR 75 | Resp 16 | Ht 61.0 in | Wt 141.0 lb

## 2013-07-16 DIAGNOSIS — I83893 Varicose veins of bilateral lower extremities with other complications: Secondary | ICD-10-CM

## 2013-07-16 NOTE — Progress Notes (Signed)
Subjective:     Patient ID: Emily Santiago, female   DOB: 1961/04/13, 53 y.o.   MRN: [id number]  HPI this 53 year old female had laser ablation right great saphenous vein from the midthigh to the saphenofemoral junction performed under local tumescent anesthesia +10-20 stab phlebectomy of secondary painful varicosities. Total of 863 J of energy was utilized. She tolerated the procedure well.   Review of Systems     Objective:   Physical Exam BP 160/84  Pulse 75  Resp 16  Ht 5\' 1"  (1.549 m)  Wt 141 lb (63.957 kg)  BMI 26.66 kg/m2       Assessment:     Well-tolerated laser ablation left great saphenous vein +1020 stab phlebectomy performed under local tumescent anesthesia    Plan:     Return 1 week for a venous duplex exam to confirm closure left great saphenous vein. Patient will then be scheduled for final procedure which will be laser ablation left small saphenous vein

## 2013-07-16 NOTE — Progress Notes (Signed)
   Laser Ablation Procedure      Date: 07/16/2013    Emily Santiago DOB:1960-11-09  Consent signed: Yes  Surgeon:J.D. Kellie Simmering  Procedure: Laser Ablation: left Greater Saphenous Vein  BP 160/84  Pulse 75  Resp 16  Ht 5\' 1"  (1.549 m)  Wt 141 lb (63.957 kg)  BMI 26.66 kg/m2  Start time: 12:50   End time: 1:10  Tumescent Anesthesia: 400 cc 0.9% NaCl with 50 cc Lidocaine HCL with 1% Epi and 15 cc 8.4% NaHCO3  Local Anesthesia: 11 cc Lidocaine HCL and NaHCO3 (ratio 2:1)  Pulsed mode: 15 watts, 568ms delay, 1.0 duration Total energy: 853.24, total pulses: 57, total time: :57     Stab Phlebectomy: 10-20 Sites: Thigh and Calf  Patient tolerated procedure well: Yes  Notes: Did complain of pain in spite of tumescent  Description of Procedure:  After marking the course of the saphenous vein and the secondary varicosities in the standing position, the patient was placed on the operating table in the supine position, and the left leg was prepped and draped in sterile fashion. Local anesthetic was administered, and under ultrasound guidance the saphenous vein was accessed with a micro needle and guide wire; then the micro puncture sheath was placed. A guide wire was inserted to the saphenofemoral junction, followed by a 5 french sheath.  The position of the sheath and then the laser fiber below the junction was confirmed using the ultrasound and visualization of the aiming beam.  Tumescent anesthesia was administered along the course of the saphenous vein using ultrasound guidance. Protective laser glasses were placed on the patient, and the laser was fired at 15 watts.  For a total of 853.24 joules.  A steri strip was applied to the puncture site.  The patient was then put into Trendelenburg position.  Local anesthetic was utilized overlying the marked varicosities.  Ten -20stab wounds were made using the tip of an 11 blade; and using the vein hook,  The phlebectomies were performed using a  hemostat to avulse these varicosities.  Adequate hemostasis was achieved, and steri strips were applied to the stab wound.      ABD pads and thigh high compression stockings were applied.  Ace wrap bandages were applied over the phlebectomy sites and at the top of the saphenofemoral junction.  Blood loss was less than 15 cc.  The patient ambulated out of the operating room having tolerated the procedure well.

## 2013-07-17 ENCOUNTER — Encounter: Payer: Self-pay | Admitting: Vascular Surgery

## 2013-07-18 ENCOUNTER — Telehealth: Payer: Self-pay | Admitting: *Deleted

## 2013-07-18 NOTE — Telephone Encounter (Signed)
Patient called saying she is nauseated and wondered if she could stop the Ibuprofen. I suggested she use Tylenol instead and to use ice tonight and heat starting tomorrow. We will see her for her fu appt Monday. Pt expressed understanding.

## 2013-07-20 ENCOUNTER — Encounter: Payer: Self-pay | Admitting: Vascular Surgery

## 2013-07-23 ENCOUNTER — Ambulatory Visit (HOSPITAL_COMMUNITY)
Admission: RE | Admit: 2013-07-23 | Discharge: 2013-07-23 | Disposition: A | Discharge status: Home / Self Care | Payer: BC Managed Care – PPO | Source: Ambulatory Visit | Attending: Vascular Surgery | Admitting: Vascular Surgery

## 2013-07-23 ENCOUNTER — Encounter: Payer: Self-pay | Admitting: Vascular Surgery

## 2013-07-23 ENCOUNTER — Encounter (INDEPENDENT_AMBULATORY_CARE_PROVIDER_SITE_OTHER): Payer: Self-pay

## 2013-07-23 ENCOUNTER — Ambulatory Visit (INDEPENDENT_AMBULATORY_CARE_PROVIDER_SITE_OTHER): Payer: BC Managed Care – PPO | Admitting: Vascular Surgery

## 2013-07-23 VITALS — BP 161/96 | HR 80 | Resp 16 | Ht 61.0 in | Wt 143.0 lb

## 2013-07-23 DIAGNOSIS — I83893 Varicose veins of bilateral lower extremities with other complications: Secondary | ICD-10-CM | POA: Insufficient documentation

## 2013-07-23 NOTE — Progress Notes (Signed)
Subjective:     Patient ID: Emily Santiago, female   DOB: 11-04-1960, 53 y.o.   MRN: [id number]  HPI this 53 year old female returns 1 week post laser ablation left great saphenous vein from midthigh up to the saphenofemoral junction plus multiple stab phlebectomy for painful varicosities. She has had some mild discomfort along the course of the great saphenous vein and some discomfort in the posterior 1 stab phlebectomy site but otherwise denies any significant discomfort. She has worn elastic compression stockings. She is unable to tolerate ibuprofen.   Review of Systems     Objective:   Physical Exam BP 161/96  Pulse 80  Resp 16  Ht 5\' 1"  (1.549 m)  Wt 143 lb (64.864 kg)  BMI 27.03 kg/m2  General well-developed well-nourished female no apparent stress were no oriented x3 Left leg with some mild ecchymosis in the mid proximal thigh. Step tubectomy sites healing nicely. No distal edema. 3 posterior cells pedis pulse palpable.  Today I ordered a venous duplex exam the left leg which are reviewed and interpreted. The left great saphenous vein is totally close from the mid thigh to near the saphenofemoral junction there is no DVT      Assessment:     Successful laser ablation left great saphenous vein with multiple step tubectomy painful varicosities    Plan:     Return soon for final procedure which will be laser ablation of left small saphenous vein

## 2013-07-27 ENCOUNTER — Encounter: Payer: Self-pay | Admitting: Vascular Surgery

## 2013-07-30 ENCOUNTER — Encounter: Payer: Self-pay | Admitting: Vascular Surgery

## 2013-07-30 ENCOUNTER — Ambulatory Visit (INDEPENDENT_AMBULATORY_CARE_PROVIDER_SITE_OTHER): Payer: BC Managed Care – PPO | Admitting: Vascular Surgery

## 2013-07-30 VITALS — BP 141/92 | HR 76 | Resp 16 | Ht 61.0 in | Wt 144.0 lb

## 2013-07-30 DIAGNOSIS — I83893 Varicose veins of bilateral lower extremities with other complications: Secondary | ICD-10-CM

## 2013-07-30 NOTE — Progress Notes (Deleted)
   Laser Ablation Procedure      Date: 07/30/2013    Emily Santiago DOB:10-Sep-1960  Consent signed: Yes  Surgeon:Lawson  Procedure: Laser Ablation: right Greater Saphenous Vein  There were no vitals taken for this visit.  Start time: 1pm   End time: 2pm  Tumescent Anesthesia: 400 cc 0.9% NaCl with 50 cc Lidocaine HCL with 1% Epi and 15 cc 8.4% NaHCO3  Local Anesthesia: 7 cc Lidocaine HCL and NaHCO3 (ratio 2:1)  Pulsed mode: 15 watts, 551ms delay 1.0 duration Total energy: 2320, total pulses: 155, total time: 2:35     Stab Phlebectomy: 10-20 Sites: Thigh and Calf  Patient tolerated procedure well: Yes  Notes:   Description of Procedure:  After marking the course of the saphenous vein and the secondary varicosities in the standing position, the patient was placed on the operating table in the supine position, and the right leg was prepped and draped in sterile fashion. Local anesthetic was administered, and under ultrasound guidance the saphenous vein was accessed with a micro needle and guide wire; then the micro puncture sheath was placed. A guide wire was inserted to the saphenofemoral junction, followed by a 5 french sheath.  The position of the sheath and then the laser fiber below the junction was confirmed using the ultrasound and visualization of the aiming beam.  Tumescent anesthesia was administered along the course of the saphenous vein using ultrasound guidance. Protective laser glasses were placed on the patient, and the laser was fired at 15 watt pulsed mode advancing 1-2 mm per sec.  For a total of 2320 joules.  A steri strip was applied to the puncture site.  The patient was then put into Trendelenburg position.  Local anesthetic was utilized overlying the marked varicosities.  Ten-20 stab wounds were made using the tip of an 11 blade; and using the vein hook,  The phlebectomies were performed using a hemostat to avulse these varicosities.  Adequate hemostasis was  achieved, and steri strips were applied to the stab wound.      ABD pads and thigh high compression stockings were applied.  Ace wrap bandages were applied over the phlebectomy sites and at the top of the saphenofemoral junction.  Blood loss was less than 15 cc.  The patient ambulated out of the operating room having tolerated the procedure well.

## 2013-07-30 NOTE — Progress Notes (Signed)
Subjective:     Patient ID: Emily Santiago, female   DOB: 1961/05/12, 53 y.o.   MRN: [id number]  HPI this 53 -year-old female had laser ablation of the left small saphenous vein performed under local tumescent anesthesia. She tolerated the procedure well. A total of 1200 J of energy was utilized   Review of Systems     Objective:   Physical Exam BP 141/92  Pulse 76  Resp 16  Ht 5\' 1"  (1.549 m)  Wt 144 lb (65.318 kg)  BMI 27.22 kg/m2        Assessment:     Well-tolerated laser ablation left small saphenous vein performed under local tumescent anesthesia    Plan:     Return in one week for venous duplex exam to confirm closure left small saphenous vein This will complete treatment regimen

## 2013-07-30 NOTE — Progress Notes (Signed)
   Laser Ablation Procedure      Date: 07/30/2013    Emily Santiago DOB:[birth year]  Consent signed: Yes  Surgeon:J.D. Kellie Simmering  Procedure: Laser Ablation: left Small Saphenous Vein  BP 141/92  Pulse 76  Resp 16  Ht 5\' 1"  (1.549 m)  Wt 144 lb (65.318 kg)  BMI 27.22 kg/m2  Start time: 3:00   End time: 3:40  Tumescent Anesthesia: 300 cc 0.9% NaCl with 50 cc Lidocaine HCL with 1% Epi and 15 cc 8.4% NaHCO3  Local Anesthesia: 5 cc Lidocaine HCL and NaHCO3 (ratio 2:1)  Pulsed mode: 15 watts, 520ms delay, 1.0 duration Total energy: 1200, total pulses: 81, total time: 1:20     Patient tolerated procedure well: Yes  Notes:   Description of Procedure:  After marking the course of the saphenous vein and the secondary varicosities in the standing position, the patient was placed on the operating table in the prone position, and the left leg was prepped and draped in sterile fashion. Local anesthetic was administered, and under ultrasound guidance the saphenous vein was accessed with a micro needle and guide wire; then the micro puncture sheath was placed. A guide wire was inserted to the saphenopopliteal junction, followed by a 5 french sheath.  The position of the sheath and then the laser fiber below the junction was confirmed using the ultrasound and visualization of the aiming beam.  Tumescent anesthesia was administered along the course of the saphenous vein using ultrasound guidance. Protective laser glasses were placed on the patient, and the laser was fired at 15 watt pulsed mode advancing 1-2 mm per sec.  For a total of 1200 joules.  A steri strip was applied to the puncture site.    ABD pads and thigh high compression stockings were applied.  Ace wrap bandages were applied over the phlebectomy sites and at the top of the saphenopopliteal junction.  Blood loss was less than 15 cc.  The patient ambulated out of the operating room having tolerated the procedure well.

## 2013-08-01 ENCOUNTER — Encounter: Payer: Self-pay | Admitting: Vascular Surgery

## 2013-08-03 ENCOUNTER — Encounter: Payer: Self-pay | Admitting: Vascular Surgery

## 2013-08-06 ENCOUNTER — Encounter (HOSPITAL_COMMUNITY): Payer: BC Managed Care – PPO

## 2013-08-06 ENCOUNTER — Ambulatory Visit (HOSPITAL_COMMUNITY): Payer: BC Managed Care – PPO

## 2013-08-06 ENCOUNTER — Ambulatory Visit (HOSPITAL_COMMUNITY)
Admission: RE | Admit: 2013-08-06 | Discharge: 2013-08-06 | Disposition: A | Discharge status: Home / Self Care | Payer: BC Managed Care – PPO | Source: Ambulatory Visit | Attending: Vascular Surgery | Admitting: Vascular Surgery

## 2013-08-06 ENCOUNTER — Ambulatory Visit: Payer: BC Managed Care – PPO | Admitting: Vascular Surgery

## 2013-08-06 ENCOUNTER — Ambulatory Visit (INDEPENDENT_AMBULATORY_CARE_PROVIDER_SITE_OTHER): Payer: Self-pay | Admitting: Vascular Surgery

## 2013-08-06 ENCOUNTER — Encounter: Payer: Self-pay | Admitting: Vascular Surgery

## 2013-08-06 VITALS — BP 155/91 | HR 69 | Resp 16 | Ht 61.0 in | Wt 144.0 lb

## 2013-08-06 DIAGNOSIS — Z9889 Other specified postprocedural states: Secondary | ICD-10-CM | POA: Insufficient documentation

## 2013-08-06 DIAGNOSIS — Z09 Encounter for follow-up examination after completed treatment for conditions other than malignant neoplasm: Secondary | ICD-10-CM | POA: Insufficient documentation

## 2013-08-06 DIAGNOSIS — I83893 Varicose veins of bilateral lower extremities with other complications: Secondary | ICD-10-CM

## 2013-08-06 NOTE — Progress Notes (Signed)
Subjective:     Patient ID: Emily Santiago, female   DOB: [birth year], 53 y.o.   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 She has had some mild/moderate discomfort in the left posterior calf but that is resolving in the left thigh discomfort from the previous ablation is also almost completely resolved. She's had no distal edema. Her leg feels much better than prior to her procedures.  Past Medical History  Diagnosis Date  . Hypertension   . Varicose veins   . Anxiety     History  Substance Use Topics  . Smoking status: Current Every Day Smoker -- 0.50 packs/day    Types: Cigarettes  . Smokeless tobacco: Not on file  . Alcohol Use: No    No family history on file.  Allergies  Allergen Reactions  . Lisinopril Cough  . Penicillins     Stomach upset    Current outpatient prescriptions:estradiol (ESTRACE) 0.5 MG tablet, Take 0.5 mg by mouth daily., Disp: , Rfl: ;  hydrochlorothiazide (HYDRODIURIL) 12.5 MG tablet, Take 12.5 mg by mouth daily., Disp: , Rfl: ;  LOSARTAN POTASSIUM PO, Take 100 mg by mouth daily., Disp: , Rfl: ;  rOPINIRole (REQUIP) 1 MG tablet, Take 1 mg by mouth daily., Disp: , Rfl: ;  varenicline (CHANTIX) 0.5 MG tablet, Take 0.5 mg by mouth as directed., Disp: , Rfl:   BP 155/91  Pulse 69  Resp 16  Ht 5\' 1"  (1.549 m)  Wt 144 lb (65.318 kg)  BMI 27.22 kg/m2  Body mass index is 27.22 kg/(m^2).          Review of Systems     Objective:   Physical Exam BP 155/91  Pulse 69  Resp 16  Ht 5\' 1"  (1.549 m)  Wt 144 lb (65.318 kg)  BMI 27.22 kg/m2  General well-developed well-nourished female no apparent stress alert and oriented x3 Left leg with well-healed stab phlebectomy sites in mild discomfort along course of small saphenous vein with no distal edema. 3+ dorsalis pedis pulse palpable.  I ordered a venous duplex exam the  left leg which are viewed and interpret. There is no DVT. The left small saphenous vein is totally closed from the ablation procedure     Assessment:     Successful laser ablation left small saphenous vein-doing well    Plan:      return to see [place] on a when necessary basis

## 2014-01-22 ENCOUNTER — Encounter (HOSPITAL_COMMUNITY): Payer: Self-pay | Admitting: Emergency Medicine

## 2014-01-22 ENCOUNTER — Emergency Department (HOSPITAL_COMMUNITY)
Admission: EM | Admit: 2014-01-22 | Discharge: 2014-01-22 | Disposition: A | Discharge status: Home / Self Care | Payer: BC Managed Care – PPO | Attending: Emergency Medicine | Admitting: Emergency Medicine

## 2014-01-22 DIAGNOSIS — Z880 Allergy status to penicillin: Secondary | ICD-10-CM | POA: Insufficient documentation

## 2014-01-22 DIAGNOSIS — I10 Essential (primary) hypertension: Secondary | ICD-10-CM | POA: Insufficient documentation

## 2014-01-22 DIAGNOSIS — Z79899 Other long term (current) drug therapy: Secondary | ICD-10-CM | POA: Diagnosis not present

## 2014-01-22 DIAGNOSIS — F411 Generalized anxiety disorder: Secondary | ICD-10-CM | POA: Insufficient documentation

## 2014-01-22 DIAGNOSIS — M5442 Lumbago with sciatica, left side: Secondary | ICD-10-CM

## 2014-01-22 DIAGNOSIS — R319 Hematuria, unspecified: Secondary | ICD-10-CM

## 2014-01-22 DIAGNOSIS — M5430 Sciatica, unspecified side: Secondary | ICD-10-CM | POA: Insufficient documentation

## 2014-01-22 DIAGNOSIS — F17200 Nicotine dependence, unspecified, uncomplicated: Secondary | ICD-10-CM | POA: Insufficient documentation

## 2014-01-22 LAB — CBC
HCT: 39.7 % (ref 36.0–46.0)
Hemoglobin: 13.2 g/dL (ref 12.0–15.0)
MCH: 31.1 pg (ref 26.0–34.0)
MCHC: 33.2 g/dL (ref 30.0–36.0)
MCV: 93.4 fL (ref 78.0–100.0)
Platelets: 207 10*3/uL (ref 150–400)
RBC: 4.25 MIL/uL (ref 3.87–5.11)
RDW: 13.3 % (ref 11.5–15.5)
WBC: 9.9 10*3/uL (ref 4.0–10.5)

## 2014-01-22 LAB — COMPREHENSIVE METABOLIC PANEL
ALT: 13 U/L (ref 0–35)
AST: 16 U/L (ref 0–37)
Albumin: 3.8 g/dL (ref 3.5–5.2)
Alkaline Phosphatase: 82 U/L (ref 39–117)
Anion gap: 13 (ref 5–15)
BUN: 18 mg/dL (ref 6–23)
CO2: 27 mEq/L (ref 19–32)
Calcium: 9.4 mg/dL (ref 8.4–10.5)
Chloride: 100 mEq/L (ref 96–112)
Creatinine, Ser: 0.61 mg/dL (ref 0.50–1.10)
GFR calc Af Amer: >90 mL/min (ref >90)
GFR calc non Af Amer: >90 mL/min (ref >90)
Glucose, Bld: 83 mg/dL (ref 70–99)
Potassium: 4 mEq/L (ref 3.7–5.3)
Sodium: 140 mEq/L (ref 137–147)
Total Bilirubin: 0.3 mg/dL (ref 0.3–1.2)
Total Protein: 6.8 g/dL (ref 6.0–8.3)

## 2014-01-22 LAB — URINALYSIS, ROUTINE W REFLEX MICROSCOPIC
Bilirubin Urine: NEGATIVE
Glucose, UA: NEGATIVE mg/dL
Ketones, ur: NEGATIVE mg/dL
Leukocytes, UA: NEGATIVE
Nitrite: NEGATIVE
Protein, ur: NEGATIVE mg/dL
Specific Gravity, Urine: 1.008 (ref 1.005–1.030)
Urobilinogen, UA: 0.2 mg/dL (ref 0.0–1.0)
pH: 7 (ref 5.0–8.0)

## 2014-01-22 LAB — URINE MICROSCOPIC-ADD ON

## 2014-01-22 MED ORDER — TRAMADOL HCL 50 MG PO TABS
50.0000 mg | ORAL_TABLET | Freq: Once | ORAL | Status: AC
  Administered 2014-01-22: 50 mg via ORAL
  Filled 2014-01-22: qty 1

## 2014-01-22 MED ORDER — TRAMADOL HCL 50 MG PO TABS: 50.0000 mg | ORAL_TABLET | Freq: Four times a day (QID) | ORAL | Status: DC | PRN

## 2014-01-22 NOTE — ED Provider Notes (Signed)
CSN: [id number]     Arrival date & time 01/22/14  1642 History   First MD Initiated Contact with Patient 01/22/14 2014     Chief Complaint  Patient presents with  . Flank Pain  . Hematuria     (Consider location/radiation/quality/duration/timing/severity/associated sxs/prior Treatment) HPI  Emily Santiago is a 53 y.o. female with past medical history of hypertension and anxiety complaining of hematuria and back pain. Patient states she has had intermittent hematuria for 5 years. She was evaluated by Dr. Bunnie Philips. several months ago and had full workup which was negative. Patient states she feels like she has a kidney infection. She denies fever, chills, nausea, vomiting, dysuria. She endorses urinary frequency. Pain is 6/10, radiates down the left leg. There are no exacerbating or alleviating symptoms. She has no pain medication prior to arrival. There are no bowel incontinence, no history of fever, cancer, night sweats, unintentional weight loss, easy bruising or bleeding.  Past Medical History  Diagnosis Date  . Hypertension   . Varicose veins   . Anxiety    Past Surgical History  Procedure Laterality Date  . Cholecystectomy    . Abdominal hysterectomy    . Wrist surgery Right    No family history on file. History  Substance Use Topics  . Smoking status: Current Some Day Smoker -- 0.50 packs/day    Types: Cigarettes  . Smokeless tobacco: Not on file  . Alcohol Use: No   OB History   Grav Para Term Preterm Abortions TAB SAB Ect Mult Living                 Review of Systems  10 systems reviewed and found to be negative, except as noted in the HPI.   Allergies  Lisinopril and Penicillins  Home Medications   Prior to Admission medications   Medication Sig Start Date End Date Taking? Authorizing Provider  estradiol (ESTRACE) 0.5 MG tablet Take 0.5 mg by mouth daily.    Historical Provider, MD  hydrochlorothiazide (HYDRODIURIL) 12.5 MG tablet Take 12.5 mg by mouth daily.     Historical Provider, MD  LOSARTAN POTASSIUM PO Take 100 mg by mouth daily.    Historical Provider, MD  rOPINIRole (REQUIP) 1 MG tablet Take 1 mg by mouth daily.    Historical Provider, MD  traMADol (ULTRAM) 50 MG tablet Take 1 tablet (50 mg total) by mouth every 6 (six) hours as needed. 01/22/14   Hudson Majkowski, PA-C  varenicline (CHANTIX) 0.5 MG tablet Take 0.5 mg by mouth as directed.    Historical Provider, MD   BP 167/85  Pulse 77  Temp(Src) 98.7 F (37.1 C) (Oral)  Resp 14  SpO2 98% Physical Exam  Nursing note and vitals reviewed. Constitutional: She is oriented to person, place, and time. She appears well-developed and well-nourished. No distress.  HENT:  Head: Normocephalic and atraumatic.  Mouth/Throat: Oropharynx is clear and moist.  Eyes: Conjunctivae and EOM are normal. Pupils are equal, round, and reactive to light.  Neck: Normal range of motion.  Cardiovascular: Normal rate, regular rhythm and intact distal pulses.   Pulmonary/Chest: Effort normal and breath sounds normal. No stridor. No respiratory distress. She has no wheezes. She has no rales. She exhibits no tenderness.  Abdominal: Soft. Bowel sounds are normal. She exhibits no distension and no mass. There is no tenderness. There is no rebound and no guarding.  Genitourinary:  No focal CVA tenderness to palpation bilaterally, there is diffuse tenderness to palpation along the  lower back especially on the left side. Straight leg raise is positive on the left side at 30. She is neurovascularly intact. Extensor hallux longus is 5 out of 5 bilaterally. Deep tendon reflexes are intact.   Musculoskeletal: Normal range of motion. She exhibits no edema and no tenderness.  Neurological: She is alert and oriented to person, place, and time.  Psychiatric: She has a normal mood and affect.    ED Course  Procedures (including critical care time) Labs Review Labs Reviewed  URINALYSIS, ROUTINE W REFLEX MICROSCOPIC -  Abnormal; Notable for the following:    Hgb urine dipstick SMALL (*)    All other components within normal limits  URINE MICROSCOPIC-ADD ON - Abnormal; Notable for the following:    Squamous Epithelial / LPF FEW (*)    All other components within normal limits  CBC  COMPREHENSIVE METABOLIC PANEL    Imaging Review No results found.   EKG Interpretation None      MDM   Final diagnoses:  Hematuria  Left-sided low back pain with left-sided sciatica    Filed Vitals:   01/22/14 1653 01/22/14 2017 01/22/14 2021 01/22/14 2030  BP: 187/95 176/95  167/85  Pulse: 83 88 81 77  Temp: 98.7 F (37.1 C)     TempSrc: Oral     Resp: 16 22 20 14   SpO2: 98% 98% 97% 98%    Medications  traMADol (ULTRAM) tablet 50 mg (50 mg Oral Given 01/22/14 2049)    Emily Santiago is a 53 y.o. female presenting with chronic hematuria and low back pain. Urinalysis shows no signs of infection. Low back pain is non-concerning for cauda equina, spinal epidural abscess. I've explained to the patient that I think that these 2 symptoms are not related. I have offered her CAT scan which in a shared decision-making capacity she has declined. I have explained to her that I do not think it will be high-yield considering the hematuria is chronic. Explained to her that she has had the appropriate workup in urology. I have reassured her and her mother. I have asked her to make an appointment with urology for further discussion and so she can understand that the hematuria is benign.   Evaluation does not show pathology that would require ongoing emergent intervention or inpatient treatment. Pt is hemodynamically stable and mentating appropriately. Discussed findings and plan with patient/guardian, who agrees with care plan. All questions answered. Return precautions discussed and outpatient follow up given.   New Prescriptions   TRAMADOL (ULTRAM) 50 MG TABLET    Take 1 tablet (50 mg total) by mouth every 6 (six) hours as  needed.         Monico Blitz, PA-C 01/25/14 1942

## 2014-01-22 NOTE — Discharge Instructions (Signed)
For pain control you may take:  800mg  of ibuprofen (that is usually 4 over the counter pills)  3 times a day (take with food) and acetaminophen 975mg  (this is 3 over the counter pills) four times a day. Do not drink alcohol or combine with other medications that have acetaminophen as an ingredient (Read the labels!).  For breakthrough pain you may take Tramadol. Do not drink alcohol drive or operate heavy machinery when taking Tramadol.  Please follow with your primary care doctor in the next 2 days for a check-up. They must obtain records for further management.   Do not hesitate to return to the Emergency Department for any new, worsening or concerning symptoms.

## 2014-01-22 NOTE — ED Notes (Signed)
Pt report hematuria for x4 days with lower back pain with radiation to hips. Reports urinary urgency and frequency. Was seen by PCP and told to come here for back xray and states did not have a uti.

## 2014-01-28 NOTE — ED Provider Notes (Signed)
Medical screening examination/treatment/procedure(s) were performed by non-physician practitioner and as supervising physician I was immediately available for consultation/collaboration.   EKG Interpretation None       Virgel Manifold, MD 01/28/14 1744

## 2014-02-04 ENCOUNTER — Encounter: Payer: Self-pay | Admitting: Family

## 2014-02-05 ENCOUNTER — Encounter: Payer: Self-pay | Admitting: Family

## 2014-02-05 ENCOUNTER — Ambulatory Visit (INDEPENDENT_AMBULATORY_CARE_PROVIDER_SITE_OTHER): Payer: BC Managed Care – PPO | Admitting: Family

## 2014-02-05 VITALS — BP 176/101 | HR 74 | Resp 16 | Ht 61.0 in | Wt 148.0 lb

## 2014-02-05 DIAGNOSIS — M79605 Pain in left leg: Secondary | ICD-10-CM | POA: Insufficient documentation

## 2014-02-05 DIAGNOSIS — M79609 Pain in unspecified limb: Secondary | ICD-10-CM

## 2014-02-05 DIAGNOSIS — M79604 Pain in right leg: Secondary | ICD-10-CM | POA: Insufficient documentation

## 2014-02-05 NOTE — Progress Notes (Addendum)
Established Venous Reflux  History of Present Illness  Emily Santiago is a 53 y.o. (06-14-1961) female patient of Dr. Kellie Simmering who is s/p laser ablation of left short saphenous vein in February, 2015. She returns today with c/o 2 months history of worsening left lateral thigh and calf pain, worse with lying down. Her back was hurting when she saw Dr. Alroy Dust recently, her PCP, was then seen in [facility] ED.   Past Medical History  Diagnosis Date  . Hypertension   . Varicose veins   . Anxiety     Social History History  Substance Use Topics  . Smoking status: Light Tobacco Smoker -- 0.50 packs/day    Types: Cigarettes  . Smokeless tobacco: Never Used  . Alcohol Use: No    Family History Family History  Problem Relation Age of Onset  . Heart disease Father   . Heart disease Maternal Grandmother     Surgical History Past Surgical History  Procedure Laterality Date  . Cholecystectomy    . Abdominal hysterectomy    . Wrist surgery Right     Allergies  Allergen Reactions  . Lisinopril Cough  . Penicillins Nausea Only    Stomach upset    Current Outpatient Prescriptions  Medication Sig Dispense Refill  . estradiol (ESTRACE) 0.5 MG tablet Take 0.5 mg by mouth daily.      Marland Kitchen gentamicin (GARAMYCIN) 0.3 % ophthalmic solution       . hydrochlorothiazide (HYDRODIURIL) 12.5 MG tablet Take 12.5 mg by mouth daily.      Marland Kitchen LOSARTAN POTASSIUM PO Take 100 mg by mouth daily.      . meloxicam (MOBIC) 15 MG tablet daily.      Marland Kitchen rOPINIRole (REQUIP) 1 MG tablet Take 1 mg by mouth daily.      . traMADol (ULTRAM) 50 MG tablet Take 1 tablet (50 mg total) by mouth every 6 (six) hours as needed.  15 tablet  0  . varenicline (CHANTIX) 0.5 MG tablet Take 0.5 mg by mouth as directed.       No current facility-administered medications for this visit.    On ROS today: See HPI for pertinent positives and negatives.  Physical Examination  Filed Vitals:   02/05/14 1623  BP: 176/101  Pulse:  74  Resp: 16  Height: 5\' 1"  (1.549 m)  Weight: 148 lb (67.132 kg)  SpO2: 98%   Body mass index is 27.98 kg/(m^2).  General: A&O x 3, WD female.  Pulmonary: Sym exp, good air movt, CTAB, no rales, rhonchi, & wheezing.  Cardiac: RRR, Nl S1, S2, no detected murmur.  Vascular: Vessel Right Left  Radial 2+Palpable 2+Palpable  Aorta Not palpable N/A  Popliteal Not palpable Not palpable  PT notPalpable notPalpable  DP 2+Palpable 2+Palpable   Gastrointestinal: soft, NTND, -G/R, - HSM, - masses, - CVAT B.  Musculoskeletal: M/S 5/5 throughout, Extremities without ischemic changes.  Neurologic: Pain and light touch intact in extremities, Motor exam as listed above.   Medical Decision Making  Emily Santiago is a 53 y.o. female who is s/p laser ablation of left short saphenous vein in February, 2015. She returns today with c/o 2 months history of worsening left lateral thigh and calf pain, worse with lying down. Her back was hurting when she saw Dr. Alroy Dust recently, her PCP, was then seen in [facility] ED.  Dr. Kellie Simmering used sonosite to investigate left leg veins. There is no evidence of venous reflux nor arterial insufficiency, palpable bilateral pedal  pulses. The left great and small saphenous veins appear to be successfully ablated.  Follow up with her PCP re left leg pain.  Based on the patient's vascular studies and examination, I have offered the patient: return as needed.   Clemon Chambers, RN, MSN, FNP-C [facility] Office: [phone]  Clinic MD: Kellie Simmering  02/05/2014, 4:44 PM

## 2014-09-12 ENCOUNTER — Ambulatory Visit: Payer: BC Managed Care – PPO | Admitting: Internal Medicine

## 2015-01-07 ENCOUNTER — Other Ambulatory Visit: Payer: Self-pay | Admitting: Internal Medicine

## 2015-01-07 ENCOUNTER — Other Ambulatory Visit: Payer: Self-pay

## 2015-01-07 DIAGNOSIS — Z1231 Encounter for screening mammogram for malignant neoplasm of breast: Secondary | ICD-10-CM

## 2015-01-15 ENCOUNTER — Ambulatory Visit
Admission: RE | Admit: 2015-01-15 | Discharge: 2015-01-15 | Disposition: A | Discharge status: Home / Self Care | Payer: BLUE CROSS/BLUE SHIELD | Source: Ambulatory Visit | Attending: Internal Medicine | Admitting: Internal Medicine

## 2015-01-15 DIAGNOSIS — Z1231 Encounter for screening mammogram for malignant neoplasm of breast: Secondary | ICD-10-CM

## 2015-09-11 ENCOUNTER — Other Ambulatory Visit: Payer: Self-pay | Admitting: Internal Medicine

## 2015-09-11 DIAGNOSIS — Z720 Tobacco use: Secondary | ICD-10-CM

## 2015-09-15 ENCOUNTER — Ambulatory Visit
Admission: RE | Admit: 2015-09-15 | Discharge: 2015-09-15 | Disposition: A | Discharge status: Home / Self Care | Payer: BLUE CROSS/BLUE SHIELD | Source: Ambulatory Visit | Attending: Internal Medicine | Admitting: Internal Medicine

## 2015-09-15 DIAGNOSIS — Z87891 Personal history of nicotine dependence: Secondary | ICD-10-CM | POA: Diagnosis not present

## 2015-09-15 DIAGNOSIS — Z720 Tobacco use: Secondary | ICD-10-CM

## 2015-10-22 DIAGNOSIS — H2513 Age-related nuclear cataract, bilateral: Secondary | ICD-10-CM | POA: Diagnosis not present

## 2016-02-25 DIAGNOSIS — F330 Major depressive disorder, recurrent, mild: Secondary | ICD-10-CM | POA: Diagnosis not present

## 2016-02-25 DIAGNOSIS — F17200 Nicotine dependence, unspecified, uncomplicated: Secondary | ICD-10-CM | POA: Diagnosis not present

## 2016-02-25 DIAGNOSIS — K1120 Sialoadenitis, unspecified: Secondary | ICD-10-CM | POA: Diagnosis not present

## 2016-02-25 DIAGNOSIS — I10 Essential (primary) hypertension: Secondary | ICD-10-CM | POA: Diagnosis not present

## 2016-03-01 DIAGNOSIS — R591 Generalized enlarged lymph nodes: Secondary | ICD-10-CM | POA: Diagnosis not present

## 2016-03-01 DIAGNOSIS — F17200 Nicotine dependence, unspecified, uncomplicated: Secondary | ICD-10-CM | POA: Diagnosis not present

## 2016-03-01 DIAGNOSIS — Z6826 Body mass index (BMI) 26.0-26.9, adult: Secondary | ICD-10-CM | POA: Diagnosis not present

## 2016-03-04 ENCOUNTER — Other Ambulatory Visit: Payer: Self-pay | Admitting: Internal Medicine

## 2016-03-04 DIAGNOSIS — R591 Generalized enlarged lymph nodes: Secondary | ICD-10-CM

## 2016-03-08 ENCOUNTER — Other Ambulatory Visit: Payer: Self-pay | Admitting: Internal Medicine

## 2016-03-08 ENCOUNTER — Ambulatory Visit
Admission: RE | Admit: 2016-03-08 | Discharge: 2016-03-08 | Disposition: A | Discharge status: Home / Self Care | Payer: BLUE CROSS/BLUE SHIELD | Source: Ambulatory Visit | Attending: Internal Medicine | Admitting: Internal Medicine

## 2016-03-08 DIAGNOSIS — R590 Localized enlarged lymph nodes: Secondary | ICD-10-CM | POA: Diagnosis not present

## 2016-03-08 DIAGNOSIS — Z1231 Encounter for screening mammogram for malignant neoplasm of breast: Secondary | ICD-10-CM

## 2016-03-08 DIAGNOSIS — R591 Generalized enlarged lymph nodes: Secondary | ICD-10-CM

## 2016-03-08 MED ORDER — IOPAMIDOL (ISOVUE-300) INJECTION 61%
75.0000 mL | Freq: Once | INTRAVENOUS | Status: AC | PRN
  Administered 2016-03-08: 75 mL via INTRAVENOUS

## 2016-03-10 DIAGNOSIS — R590 Localized enlarged lymph nodes: Secondary | ICD-10-CM | POA: Diagnosis not present

## 2016-03-10 DIAGNOSIS — R490 Dysphonia: Secondary | ICD-10-CM | POA: Diagnosis not present

## 2016-03-15 DIAGNOSIS — R220 Localized swelling, mass and lump, head: Secondary | ICD-10-CM | POA: Diagnosis not present

## 2016-03-15 DIAGNOSIS — H9201 Otalgia, right ear: Secondary | ICD-10-CM | POA: Diagnosis not present

## 2016-03-15 DIAGNOSIS — Z6826 Body mass index (BMI) 26.0-26.9, adult: Secondary | ICD-10-CM | POA: Diagnosis not present

## 2016-03-17 ENCOUNTER — Ambulatory Visit
Admission: RE | Admit: 2016-03-17 | Discharge: 2016-03-17 | Disposition: A | Discharge status: Home / Self Care | Payer: BLUE CROSS/BLUE SHIELD | Source: Ambulatory Visit | Attending: Internal Medicine | Admitting: Internal Medicine

## 2016-03-17 DIAGNOSIS — Z1231 Encounter for screening mammogram for malignant neoplasm of breast: Secondary | ICD-10-CM

## 2016-03-19 ENCOUNTER — Other Ambulatory Visit (HOSPITAL_COMMUNITY)
Admission: RE | Admit: 2016-03-19 | Discharge: 2016-03-19 | Disposition: A | Discharge status: Home / Self Care | Payer: BLUE CROSS/BLUE SHIELD | Source: Ambulatory Visit | Attending: Otolaryngology | Admitting: Otolaryngology

## 2016-03-19 ENCOUNTER — Other Ambulatory Visit (INDEPENDENT_AMBULATORY_CARE_PROVIDER_SITE_OTHER): Payer: Self-pay | Admitting: Otolaryngology

## 2016-03-19 DIAGNOSIS — C8231 Follicular lymphoma grade IIIa, lymph nodes of head, face, and neck: Secondary | ICD-10-CM | POA: Diagnosis not present

## 2016-03-19 DIAGNOSIS — D487 Neoplasm of uncertain behavior of other specified sites: Secondary | ICD-10-CM | POA: Diagnosis not present

## 2016-03-19 DIAGNOSIS — C8291 Follicular lymphoma, unspecified, lymph nodes of head, face, and neck: Secondary | ICD-10-CM | POA: Diagnosis not present

## 2016-03-22 DIAGNOSIS — Z6825 Body mass index (BMI) 25.0-25.9, adult: Secondary | ICD-10-CM | POA: Diagnosis not present

## 2016-03-22 DIAGNOSIS — C8231 Follicular lymphoma grade IIIa, lymph nodes of head, face, and neck: Secondary | ICD-10-CM | POA: Diagnosis not present

## 2016-03-22 DIAGNOSIS — J019 Acute sinusitis, unspecified: Secondary | ICD-10-CM | POA: Diagnosis not present

## 2016-03-22 DIAGNOSIS — J029 Acute pharyngitis, unspecified: Secondary | ICD-10-CM | POA: Diagnosis not present

## 2016-03-29 ENCOUNTER — Telehealth: Payer: Self-pay | Admitting: Hematology

## 2016-03-29 ENCOUNTER — Encounter: Payer: Self-pay | Admitting: Hematology

## 2016-03-29 NOTE — Telephone Encounter (Signed)
Faxed referring provider appt date/time

## 2016-04-02 ENCOUNTER — Ambulatory Visit (HOSPITAL_BASED_OUTPATIENT_CLINIC_OR_DEPARTMENT_OTHER): Payer: BLUE CROSS/BLUE SHIELD | Admitting: Hematology

## 2016-04-02 ENCOUNTER — Other Ambulatory Visit: Payer: Self-pay | Admitting: *Deleted

## 2016-04-02 ENCOUNTER — Encounter: Payer: Self-pay | Admitting: Hematology

## 2016-04-02 ENCOUNTER — Ambulatory Visit (HOSPITAL_BASED_OUTPATIENT_CLINIC_OR_DEPARTMENT_OTHER): Payer: BLUE CROSS/BLUE SHIELD

## 2016-04-02 VITALS — BP 188/86 | HR 71 | Temp 97.4°F | Resp 18 | Ht 61.0 in | Wt 138.8 lb

## 2016-04-02 DIAGNOSIS — K219 Gastro-esophageal reflux disease without esophagitis: Secondary | ICD-10-CM

## 2016-04-02 DIAGNOSIS — I10 Essential (primary) hypertension: Secondary | ICD-10-CM

## 2016-04-02 DIAGNOSIS — Z720 Tobacco use: Secondary | ICD-10-CM

## 2016-04-02 DIAGNOSIS — F418 Other specified anxiety disorders: Secondary | ICD-10-CM | POA: Diagnosis not present

## 2016-04-02 DIAGNOSIS — C8291 Follicular lymphoma, unspecified, lymph nodes of head, face, and neck: Secondary | ICD-10-CM

## 2016-04-02 DIAGNOSIS — C8280 Other types of follicular lymphoma, unspecified site: Secondary | ICD-10-CM

## 2016-04-02 DIAGNOSIS — G2581 Restless legs syndrome: Secondary | ICD-10-CM

## 2016-04-02 DIAGNOSIS — I8390 Asymptomatic varicose veins of unspecified lower extremity: Secondary | ICD-10-CM

## 2016-04-02 DIAGNOSIS — R1310 Dysphagia, unspecified: Secondary | ICD-10-CM

## 2016-04-02 DIAGNOSIS — K589 Irritable bowel syndrome without diarrhea: Secondary | ICD-10-CM | POA: Diagnosis not present

## 2016-04-02 DIAGNOSIS — N951 Menopausal and female climacteric states: Secondary | ICD-10-CM

## 2016-04-02 LAB — LACTATE DEHYDROGENASE: LDH: 193 U/L (ref 125–245)

## 2016-04-02 LAB — CBC & DIFF AND RETIC
BASO%: 0.1 % (ref 0.0–2.0)
Basophils Absolute: 0 10*3/uL (ref 0.0–0.1)
EOS%: 1.4 % (ref 0.0–7.0)
Eosinophils Absolute: 0.1 10*3/uL (ref 0.0–0.5)
HCT: 40.8 % (ref 34.8–46.6)
HGB: 14.1 g/dL (ref 11.6–15.9)
Immature Retic Fract: 0.8 % — ABNORMAL LOW (ref 1.60–10.00)
LYMPH%: 27.6 % (ref 14.0–49.7)
MCH: 31.5 pg (ref 25.1–34.0)
MCHC: 34.6 g/dL (ref 31.5–36.0)
MCV: 91.3 fL (ref 79.5–101.0)
MONO#: 0.5 10*3/uL (ref 0.1–0.9)
MONO%: 4.5 % (ref 0.0–14.0)
NEUT#: 6.7 10*3/uL — ABNORMAL HIGH (ref 1.5–6.5)
NEUT%: 66.4 % (ref 38.4–76.8)
Platelets: 213 10*3/uL (ref 145–400)
RBC: 4.47 10*6/uL (ref 3.70–5.45)
RDW: 12.8 % (ref 11.2–14.5)
Retic %: 0.97 % (ref 0.70–2.10)
Retic Ct Abs: 43.36 10*3/uL (ref 33.70–90.70)
WBC: 10.1 10*3/uL (ref 3.9–10.3)
lymph#: 2.8 10*3/uL (ref 0.9–3.3)

## 2016-04-02 LAB — COMPREHENSIVE METABOLIC PANEL
ALT: 34 U/L (ref 0–55)
AST: 32 U/L (ref 5–34)
Albumin: 4 g/dL (ref 3.5–5.0)
Alkaline Phosphatase: 99 U/L (ref 40–150)
Anion Gap: 10 mEq/L (ref 3–11)
BUN: 12.3 mg/dL (ref 7.0–26.0)
CO2: 25 mEq/L (ref 22–29)
Calcium: 9.4 mg/dL (ref 8.4–10.4)
Chloride: 103 mEq/L (ref 98–109)
Creatinine: 0.7 mg/dL (ref 0.6–1.1)
EGFR: >90 mL/min/{1.73_m2} (ref >90)
Glucose: 81 mg/dl (ref 70–140)
Potassium: 3.7 mEq/L (ref 3.5–5.1)
Sodium: 138 mEq/L (ref 136–145)
Total Bilirubin: 0.32 mg/dL (ref 0.20–1.20)
Total Protein: 7.3 g/dL (ref 6.4–8.3)

## 2016-04-02 NOTE — Progress Notes (Signed)
Marland Kitchen    HEMATOLOGY/ONCOLOGY CONSULTATION NOTE  Date of Service: 04/02/2016  Patient Care Team: L.Donnie Coffin, MD as PCP - General (Family Medicine)  CHIEF COMPLAINTS/PURPOSE OF CONSULTATION:  Newly diagnosed follicular large cell lymphoma  HISTORY OF PRESENTING ILLNESS:   Emily Santiago is a wonderful 55 y.o. female who has been referred to [place] by Dr .Emilio Math for evaluation and management of newly diagnosed follicular large cell lymphoma.  Patient has a history of multiple medical comorbidities including extensive smoking, COPD, depression, anxiety who presented to her primary care physician with a knot in the right upper neck that arose over a week. She was empirically treated with clindamycin for possible dental/periodontal infection. The knot continued to increase in size and therefore she was referred to Dr. Benjamine Mola (ENT) . Patient had a CT of her neck on 03/08/2016 which showed Right level IIa lymphadenopathy at site of palpable interest. Additionally, there are prominent right upper cervical and left supraclavicular lymph nodes.  She had excision of one of her right upper cervical lymph nodes on 03/19/2016 by Dr Benjamine Mola and pathology revealed a high-grade 3A follicular lymphoma, large cell type . Most lymphoid follicles averaging more than 15 large cells per high-power field with Ki-67 ranging from 10% to more than 62% in many follicles.  Patient clinically reports that the residual right upper neck lymph nodes are continuing to grow noticeably. She reports no overt weight loss. Has had hot flashes from her menopausal symptoms with intermittent chills. Does not report any other enlarged lymph nodes. No chest pain no overt new shortness of breath. No abdominal pain. No abdominal distention. Patient reports that she had a fatty tumor removed from her left axilla. She is accompanied to this visit by her husband and mother who help her fill-in the story.  She is very anxious  and the family had multiple different questions which were answered to the best of my ability.   MEDICAL HISTORY:  Past Medical History:  Diagnosis Date  . Anxiety   . Hypertension   . Varicose veins   Extensive history of smoking 1.5 packs per day for 35 years with COPD Irritable bowel syndrome constipation predominant on Amitiza Perimenopausal with hot flashes Depression/anxiety on Lexapro-noncompliant with regular use No known cardiac problems. Restless leg syndrome Varicose veins status post bilateral venous stripping Dysphagia status post esophageal dilatation GERD  SURGICAL HISTORY: Past Surgical History:  Procedure Laterality Date  . ABDOMINAL HYSTERECTOMY    . CHOLECYSTECTOMY    . WRIST SURGERY Right     SOCIAL HISTORY: Social History   Social History  . Marital status: Divorced    Spouse name: N/A  . Number of children: N/A  . Years of education: N/A   Occupational History  . Not on file.   Social History Main Topics  . Smoking status: Light Tobacco Smoker    Packs/day: 0.50    Types: Cigarettes  . Smokeless tobacco: Never Used  . Alcohol use No  . Drug use: Unknown  . Sexual activity: Not on file   Other Topics Concern  . Not on file   Social History Narrative  . No narrative on file    FAMILY HISTORY: Family History  Problem Relation Age of Onset  . Heart disease Father   . Heart disease Maternal Grandmother   Father lung cancer Paternal grandfather prostate cancer Brother lung cancer with metastases  ALLERGIES:  is allergic to lisinopril and penicillins.  MEDICATIONS:  Current Outpatient Prescriptions  Medication Sig  Dispense Refill  . estradiol (ESTRACE) 0.5 MG tablet Take 0.5 mg by mouth daily.    Marland Kitchen gentamicin (GARAMYCIN) 0.3 % ophthalmic solution     . hydrochlorothiazide (HYDRODIURIL) 12.5 MG tablet Take 12.5 mg by mouth daily.    Marland Kitchen LOSARTAN POTASSIUM PO Take 100 mg by mouth daily.    . meloxicam (MOBIC) 15 MG tablet daily.      Marland Kitchen rOPINIRole (REQUIP) 1 MG tablet Take 1 mg by mouth daily.    . traMADol (ULTRAM) 50 MG tablet Take 1 tablet (50 mg total) by mouth every 6 (six) hours as needed. 15 tablet 0  . varenicline (CHANTIX) 0.5 MG tablet Take 0.5 mg by mouth as directed.     No current facility-administered medications for this visit.     REVIEW OF SYSTEMS:    10 Point review of Systems was done is negative except as noted above.  PHYSICAL EXAMINATION: ECOG PERFORMANCE STATUS: 1 - Symptomatic but completely ambulatory  . Vitals:   04/02/16 1219  BP: (!) 188/86  Pulse: 71  Resp: 18  Temp: 97.4 F (36.3 C)   Filed Weights   04/02/16 1219  Weight: 138 lb 12.8 oz (63 kg)   .Body mass index is 26.23 kg/m.  GENERAL:alert, in no acute distress and comfortable SKIN: skin color, texture, turgor are normal, no rashes or significant lesions EYES: normal, conjunctiva are pink and non-injected, sclera clear OROPHARYNX:no exudate, no erythema and lips, buccal mucosa, and tongue normal  NECK: supple, no JVD, thyroid normal size, non-tender, without nodularity LYMPH:  Palpable right upper cervical and left supraclavicular lymphadenopathy no palpable axillary or inguinal lymphadenopathy noted. LUNGS: clear to auscultation with normal respiratory effort HEART: regular rate & rhythm,  no murmurs and no lower extremity edema ABDOMEN: abdomen soft, non-tender, normoactive bowel sounds , no palpable hepatosplenomegaly Musculoskeletal: no cyanosis of digits and no clubbing  PSYCH: alert & oriented x 3 with fluent speech NEURO: no focal motor/sensory deficits  LABORATORY DATA:  I have reviewed the data as listed  . CBC Latest Ref Rng & Units 01/22/2014 01/29/2009 10/27/2007  WBC 4.0 - 10.5 K/uL 9.9 10.4 6.9  Hemoglobin 12.0 - 15.0 g/dL 13.2 14.4 14.7  Hematocrit 36.0 - 46.0 % 39.7 42.2 42.7  Platelets 150 - 400 K/uL 207 188 183    . CMP Latest Ref Rng & Units 01/22/2014 01/29/2009 10/27/2007  Glucose 70 - 99  mg/dL 83 93 82  BUN 6 - 23 mg/dL _0 Creatinine 0.50 - 1.10 mg/dL 0.61 0.75 0.63  Sodium 137 - 147 mEq/L 140 134(L) 142  Potassium 3.7 - 5.3 mEq/L 4.0 3.9 4.2  Chloride 96 - 112 mEq/L 100 105 107  CO2 19 - 32 mEq/L _1 Calcium 8.4 - 10.5 mg/dL 9.4 9.0 9.3  Total Protein 6.0 - 8.3 g/dL 6.8 6.9 -  Total Bilirubin 0.3 - 1.2 mg/dL 0.3 0.4 -  Alkaline Phos 39 - 117 U/L 82 79 -  AST 0 - 37 U/L 16 18 -  ALT 0 - 35 U/L 13 11 -   Component     Latest Ref Rng & Units 04/02/2016  WBC     3.9 - 10.3 10e3/uL 10.1  NEUT#     1.5 - 6.5 10e3/uL 6.7 (H)  Hemoglobin     11.6 - 15.9 g/dL 14.1  HCT     34.8 - 46.6 % 40.8  Platelets     145 - 400 10e3/uL 213  MCV  79.5 - 101.0 fL 91.3  MCH     25.1 - 34.0 pg 31.5  MCHC     31.5 - 36.0 g/dL 34.6  RBC     3.70 - 5.45 10e6/uL 4.47  RDW     11.2 - 14.5 % 12.8  lymph#     0.9 - 3.3 10e3/uL 2.8  MONO#     0.1 - 0.9 10e3/uL 0.5  Eosinophils Absolute     0.0 - 0.5 10e3/uL 0.1  Basophils Absolute     0.0 - 0.1 10e3/uL 0.0  NEUT%     38.4 - 76.8 % 66.4  LYMPH%     14.0 - 49.7 % 27.6  MONO%     0.0 - 14.0 % 4.5  EOS%     0.0 - 7.0 % 1.4  BASO%     0.0 - 2.0 % 0.1  Retic %     0.70 - 2.10 % 0.97  Retic Ct Abs     33.70 - 90.70 10e3/uL 43.36  Immature Retic Fract     1.60 - 10.00 % 0.80 (L)  Sodium     136 - 145 mEq/L 138  Potassium     3.5 - 5.1 mEq/L 3.7  Chloride     98 - 109 mEq/L 103  CO2     22 - 29 mEq/L 25  Glucose     70 - 140 mg/dl 81  BUN     7.0 - 26.0 mg/dL 12.3  Creatinine     0.6 - 1.1 mg/dL 0.7  Total Bilirubin     0.20 - 1.20 mg/dL 0.32  Alkaline Phosphatase     40 - 150 U/L 99  AST     5 - 34 U/L 32  ALT     0 - 55 U/L 34  Total Protein     6.4 - 8.3 g/dL 7.3  Albumin     3.5 - 5.0 g/dL 4.0  Calcium     8.4 - 10.4 mg/dL 9.4  Anion gap     3 - 11 mEq/L 10  EGFR     >90 ml/min/1.73 m2 >90  LDH     125 - 245 U/L 193  Sed Rate     0 - 40 mm/hr 6  Hep C Virus Ab     0.0 - 0.9  s/co ratio <0.1  Hep B Core Ab, Tot     Negative Negative  Hepatitis B Surface Ag     Negative Negative       RADIOGRAPHIC STUDIES: I have personally reviewed the radiological images as listed and agreed with the findings in the report. Ct Soft Tissue Neck W Contrast  Result Date: 03/08/2016 CLINICAL DATA:  55 y/o F; right-sided lymphadenopathy marked with vitamin E capsule for 2 weeks. History of smoking. EXAM: CT NECK WITH CONTRAST TECHNIQUE: Multidetector CT imaging of the neck was performed using the standard protocol following the bolus administration of intravenous contrast. CONTRAST:  3m ISOVUE-300 IOPAMIDOL (ISOVUE-300) INJECTION 61% COMPARISON:  None. FINDINGS: Pharynx and larynx: There is asymmetric thickening of the right aryepiglottic fold and effacement of the right vallecula. No inflammatory changes. Salivary glands: No inflammation, mass, or stone. Thyroid: 6 mm right thyroid mid lobe nodule. Lymph nodes: Right level IIa lymphadenopathy within node measuring 18 x 14 mm and second node measuring 14 x 12 mm (series 8, image 48) subjacent to vitamin E capsule marking palpable interest. Enlarged right level 3 node measuring 9 x 8 mm,  series 8, image 57. Additionally, there are prominent left supraclavicular lymph nodes. Vascular: Negative. Limited intracranial: Negative. Visualized orbits: Negative. Mastoids and visualized paranasal sinuses: Clear. Skeleton: No acute or aggressive process. Cervical degenerative changes greatest from C4 through C6. Upper chest: Negative. Other: Choose IMPRESSION: 1. Right level IIa lymphadenopathy at site of palpable interest. Additionally, there are prominent right upper cervical and left supraclavicular lymph nodes. This may represent nodal metastasis or be related to a systemic inflammatory or lymphoproliferative process. No infectious/and process in the neck identified. 2. Effacement of the right vallecula an asymmetric thickening of the right  aryepiglottic fold, direct visualization is recommended to evaluate for mucosal lesion. These results will be called to the ordering clinician or representative by the Radiologist Assistant, and communication documented in the PACS or zVision Dashboard. Electronically Signed   By: Kristine Garbe M.D.   On: 03/08/2016 17:23   Mm Digital Screening Bilateral  Result Date: 03/18/2016 CLINICAL DATA:  Screening. EXAM: DIGITAL SCREENING BILATERAL MAMMOGRAM WITH CAD COMPARISON:  Previous exam(s). ACR Breast Density Category b: There are scattered areas of fibroglandular density. FINDINGS: There are no findings suspicious for malignancy. Images were processed with CAD. IMPRESSION: No mammographic evidence of malignancy. A result letter of this screening mammogram will be mailed directly to the patient. RECOMMENDATION: Screening mammogram in one year. (Code:SM-B-01Y) BI-RADS CATEGORY  1: Negative. Electronically Signed   By: Abelardo Diesel M.D.   On: 03/18/2016 17:10    ASSESSMENT & PLAN:   55 year old outpatient female with above-mentioned medical comorbidities with  #1 newly diagnosed high-grade follicular large cell lymphoma. Currently noted to have right cervical and left supraclavicular lymphadenopathy. Staging is incomplete at this time. Pathology suggests high-grade 3A. several areas with Ki-67 more than 50% with more than 15 large cells per high-power field in many areas This is anticipated to behave more like a large cell lymphoma than a follicular lymphoma based on pathology and clinical characteristics evident at this time. Patient reports noticeable growth in her cervical lymph nodes over a couple weeks. Plan -We'll get baseline labs today. -PET/CT scan to complete staging -ECHO for pre-chemotherapy baseline -based on further clarification of pathology findings will need to determine whether to treat this with Bendamustine/Rituxan like a less aggressive follicular lymphoma vs  considering R-CHOP for more aggressive Follicular Large cell lymphoma (likely the preferred approach) -if R-CHOP is decided might need port placement . -The diagnosis, workup, natural history and potential treatment options were discussed in details with the patient and her husband and mother and all their questions were answered in details.  #2 Depression/anxiety -Patient is supposed to be on Lexapro. She reports that she is taking it on and off. I counseled her against using it this way and recommended she maintain compliance with this.  #3 HTN Extensive history of smoking 1.5 packs per day for 35 years with COPD Irritable bowel syndrome constipation predominant on Amitiza Perimenopausal with hot flashes Depression/anxiety on Lexapro-noncompliant with regular use No known cardiac problems. Restless leg syndrome Varicose veins status post bilateral venous stripping Dysphagia status post esophageal dilatation GERD Plan -Continue follow-up with primary care physician -Patient notes chronic dysphagia symptoms would recommend a GI evaluation for EGD to rule out peptic stricture and consider need for esophageal dilatation.  #4 Cigarette smoking  -Counseled on absolute smoking cessation. -She has not tolerated Chantix in the past due to bad dreams and mood issues -Given information for [facility] quit line.   RTC in one week with Dr. Irene Limbo  with PET/CT and echo results to make final treatment recommendations.   All of the patients questions were answered with apparent satisfaction. The patient knows to call the clinic with any problems, questions or concerns.  I spent 55 minutes counseling the patient face to face. The total time spent in the appointment was 70 minutes and more than 50% was on counseling and direct patient cares.    Sullivan Lone MD [place] AAHIVMS [facility] Hematology/Oncology Physician [facility]  (Office):       [phone] (Work cell):   [phone] (Fax):           [phone]  04/02/2016 12:36 PM

## 2016-04-03 LAB — HEPATITIS B CORE ANTIBODY, TOTAL: Hep B Core Ab, Tot: NEGATIVE

## 2016-04-03 LAB — HEPATITIS B SURFACE ANTIGEN: HBsAg Screen: NEGATIVE

## 2016-04-03 LAB — SEDIMENTATION RATE: Sedimentation Rate-Westergren: 6 mm/hr (ref 0–40)

## 2016-04-03 LAB — HEPATITIS C ANTIBODY: Hep C Virus Ab: <0.1 s/co ratio (ref 0.0–0.9)

## 2016-04-06 ENCOUNTER — Telehealth: Payer: Self-pay | Admitting: *Deleted

## 2016-04-06 NOTE — Telephone Encounter (Signed)
Notified MD Irene Limbo 04/05/13. Notified New patient coordinator Aaron Edelman) about 2nd opinion. Emily Santiago states that she will handle the 2nd opinion information.

## 2016-04-06 NOTE — Telephone Encounter (Signed)
VM message received from pt requesting a call back regarding getting a 2nd opinion on her condition.  TCT patient. She states she would like a 2nd opinion from Dr. Alvy Bimler regarding her lymphoma.  She is schedule for cardiac echo and PET scan on 04/12/16 andan appt with Dr. Irene Limbo on 04/16/16.  She would like to see Dr. Alvy Bimler after 04/27/16 for the 2nd opinion.

## 2016-04-06 NOTE — Telephone Encounter (Signed)
Nikki, I would recommend seeing her soon after her appt with Dr. Irene Limbo due to urgency to start Rx soon. If patient wants to be seen after 11/14, I'm off to [facility] on 11/15 so the earliest would be 11/16 at 2 pm

## 2016-04-12 ENCOUNTER — Telehealth: Payer: Self-pay | Admitting: Hematology and Oncology

## 2016-04-12 ENCOUNTER — Ambulatory Visit (HOSPITAL_COMMUNITY)
Admission: RE | Admit: 2016-04-12 | Discharge: 2016-04-12 | Disposition: A | Discharge status: Home / Self Care | Payer: BLUE CROSS/BLUE SHIELD | Source: Ambulatory Visit | Attending: Hematology | Admitting: Hematology

## 2016-04-12 ENCOUNTER — Ambulatory Visit (HOSPITAL_BASED_OUTPATIENT_CLINIC_OR_DEPARTMENT_OTHER)
Admission: RE | Admit: 2016-04-12 | Discharge: 2016-04-12 | Disposition: A | Discharge status: Home / Self Care | Payer: BLUE CROSS/BLUE SHIELD | Source: Ambulatory Visit | Attending: Hematology | Admitting: Hematology

## 2016-04-12 DIAGNOSIS — C8280 Other types of follicular lymphoma, unspecified site: Secondary | ICD-10-CM

## 2016-04-12 DIAGNOSIS — C8330 Diffuse large B-cell lymphoma, unspecified site: Secondary | ICD-10-CM | POA: Diagnosis not present

## 2016-04-12 DIAGNOSIS — I10 Essential (primary) hypertension: Secondary | ICD-10-CM | POA: Diagnosis not present

## 2016-04-12 LAB — GLUCOSE, CAPILLARY: Glucose-Capillary: 93 mg/dL (ref 65–99)

## 2016-04-12 MED ORDER — FLUDEOXYGLUCOSE F - 18 (FDG) INJECTION
6.8700 | Freq: Once | INTRAVENOUS | Status: AC | PRN
  Administered 2016-04-12: 6.87 via INTRAVENOUS

## 2016-04-12 NOTE — Progress Notes (Signed)
  Echocardiogram 2D Echocardiogram has been performed.  Darlina Sicilian M 04/12/2016, 11:32 AM

## 2016-04-12 NOTE — Telephone Encounter (Signed)
Lt vm regarding scheduling 2nd opinion with Dr. Alvy Bimler.

## 2016-04-13 ENCOUNTER — Telehealth: Payer: Self-pay | Admitting: *Deleted

## 2016-04-13 NOTE — Telephone Encounter (Addendum)
"  i need to know if yesterday's scan results have been received.  The nurse told me to call today and she'd give me the results."  Call transferred to ext [phone].  Next scheduled F/u 04-16-2016.

## 2016-04-14 ENCOUNTER — Telehealth: Payer: Self-pay | Admitting: Hematology and Oncology

## 2016-04-14 ENCOUNTER — Telehealth: Payer: Self-pay | Admitting: *Deleted

## 2016-04-14 NOTE — Telephone Encounter (Signed)
Pt confirmed appt for 2nd opinion with Dr Alvy Bimler.

## 2016-04-14 NOTE — Telephone Encounter (Signed)
Patient called and left message that she wants to change her appt from this Friday to tomorrow so her mother can come and hear results.  Her mother is going out of town on Friday.  Will forward message to Dr. Erline Levine, however they are at [employer] today and therefore out of this office.   Called patient and let her know that Dr. Irene Limbo is working at another satellite today and it may be tomorrow before she hears back.  Her call back is [phone].

## 2016-04-16 ENCOUNTER — Encounter: Payer: Self-pay | Admitting: Hematology

## 2016-04-16 ENCOUNTER — Ambulatory Visit (HOSPITAL_BASED_OUTPATIENT_CLINIC_OR_DEPARTMENT_OTHER): Payer: BLUE CROSS/BLUE SHIELD | Admitting: Hematology

## 2016-04-16 VITALS — BP 168/83 | HR 77 | Temp 98.1°F | Resp 16

## 2016-04-16 DIAGNOSIS — C8231 Follicular lymphoma grade IIIa, lymph nodes of head, face, and neck: Secondary | ICD-10-CM

## 2016-04-16 DIAGNOSIS — C8280 Other types of follicular lymphoma, unspecified site: Secondary | ICD-10-CM

## 2016-04-16 DIAGNOSIS — N951 Menopausal and female climacteric states: Secondary | ICD-10-CM

## 2016-04-16 DIAGNOSIS — I10 Essential (primary) hypertension: Secondary | ICD-10-CM | POA: Diagnosis not present

## 2016-04-16 DIAGNOSIS — J449 Chronic obstructive pulmonary disease, unspecified: Secondary | ICD-10-CM

## 2016-04-16 DIAGNOSIS — K219 Gastro-esophageal reflux disease without esophagitis: Secondary | ICD-10-CM

## 2016-04-16 DIAGNOSIS — R1310 Dysphagia, unspecified: Secondary | ICD-10-CM

## 2016-04-16 DIAGNOSIS — K589 Irritable bowel syndrome without diarrhea: Secondary | ICD-10-CM

## 2016-04-16 DIAGNOSIS — Z720 Tobacco use: Secondary | ICD-10-CM

## 2016-04-16 DIAGNOSIS — F329 Major depressive disorder, single episode, unspecified: Secondary | ICD-10-CM

## 2016-04-16 DIAGNOSIS — R1319 Other dysphagia: Secondary | ICD-10-CM

## 2016-04-20 ENCOUNTER — Ambulatory Visit (HOSPITAL_BASED_OUTPATIENT_CLINIC_OR_DEPARTMENT_OTHER): Payer: BLUE CROSS/BLUE SHIELD | Admitting: Hematology and Oncology

## 2016-04-20 ENCOUNTER — Encounter: Payer: Self-pay | Admitting: Hematology and Oncology

## 2016-04-20 ENCOUNTER — Telehealth: Payer: Self-pay | Admitting: Hematology and Oncology

## 2016-04-20 DIAGNOSIS — Z720 Tobacco use: Secondary | ICD-10-CM | POA: Diagnosis not present

## 2016-04-20 DIAGNOSIS — I10 Essential (primary) hypertension: Secondary | ICD-10-CM

## 2016-04-20 DIAGNOSIS — C8231 Follicular lymphoma grade IIIa, lymph nodes of head, face, and neck: Secondary | ICD-10-CM | POA: Diagnosis not present

## 2016-04-20 DIAGNOSIS — R208 Other disturbances of skin sensation: Secondary | ICD-10-CM

## 2016-04-20 DIAGNOSIS — L7682 Other postprocedural complications of skin and subcutaneous tissue: Secondary | ICD-10-CM

## 2016-04-20 MED ORDER — NICOTINE 21 MG/24HR TD PT24: 21.0000 mg | MEDICATED_PATCH | Freq: Every day | TRANSDERMAL | 0 refills | Status: DC

## 2016-04-20 NOTE — Telephone Encounter (Signed)
Gave patient avs report and appointments for November.   Patient will be contacted by Velora Heckler and Radiation oncology re referrals entered by [place] 11/3. Patient now seeing NG. Per patient referrals are still needed.

## 2016-04-21 ENCOUNTER — Telehealth: Payer: Self-pay | Admitting: Hematology and Oncology

## 2016-04-21 ENCOUNTER — Encounter: Payer: Self-pay | Admitting: Hematology and Oncology

## 2016-04-21 DIAGNOSIS — L7682 Other postprocedural complications of skin and subcutaneous tissue: Secondary | ICD-10-CM | POA: Insufficient documentation

## 2016-04-21 DIAGNOSIS — I10 Essential (primary) hypertension: Secondary | ICD-10-CM | POA: Insufficient documentation

## 2016-04-21 DIAGNOSIS — C8231 Follicular lymphoma grade IIIa, lymph nodes of head, face, and neck: Secondary | ICD-10-CM | POA: Insufficient documentation

## 2016-04-21 NOTE — Assessment & Plan Note (Addendum)
I had a long discussion with the patient and her husband. I reviewed the pathology slides with the pathologist for over 20 minutes with extensive discussion. The patient has high-grade disease interspersed with some low-grade follicular lymphoma in the background. The description used in the pathology report was large cell follicular lymphoma, high-grade. In some areas, Ki 67 score was greater than 50%  We reviewed the current guidelines. Clinically, she appears only have stage I disease, localized in the right side of her neck I would recommend we proceed with standard treatment with R-CHOP treatment x 3 cycles followed by PET scan and consolidation treatment with radiation treatment I discussed the rationale for chemotherapy and acknowledge the differences in opinion from his previous oncologist I also offer her another opinion and elsewhere but the patient declined I will see her back in 2 weeks after chemotherapy education class with plan to start treatment after Thanksgiving to allow further healing on the biopsy site Even though clinically the incision wound has healed well but she is still experiencing significant amount of pain

## 2016-04-21 NOTE — Progress Notes (Signed)
START OFF PATHWAY REGIMEN - Lymphoma and CLL  Off Pathway: R-CHOP q21 days  [id number]:R-CHOP q21 days:   A cycle is every 21 days:     Rituximab (Rituxan(R)) 375 mg/m2 in _____ mL NS IV day 1 only. Dose Mod: None     Cyclophosphamide (Cytoxan(R)) 750 mg/m2 in 250 mL NS IV over 30 minutes day 1 only Dose Mod: None     Doxorubicin (Adriamycin(R)) 50 mg/m2 IV push day 1 only Dose Mod: None     Vincristine (Oncovin(R)) 1.4 mg/m2; MAX 2 mg in 50 mL normal saline IV over 20 minutes on day 1 only. (MAX DOSE 2 mg) Dose Mod: None     Prednisone 100 mg flat dose orally daily days 1,2,3,4,5. Dose Mod: None Additional Orders: Hepatitis B&C testing recommended prior to rituximab use on all patients. Final concentration of rituximab must be between 1 and 4 mg/mL.  **Always confirm dose/schedule in your pharmacy ordering system**    Patient Characteristics: Follicular, Grades 1, 2, and 3A, First Line, Stage I / II Disease Type: Follicular Disease Type: Not Applicable Grade: Grades 1, 2, and 3A [place] Stage: IA Line of therapy: First Line  Intent of Therapy: Curative Intent, Discussed with Patient

## 2016-04-21 NOTE — Progress Notes (Addendum)
[place] FOLLOW-UP progress notes  Patient Care Team: Velna Hatchet, MD as PCP - General (Internal Medicine) Leta Baptist, MD as Consulting Physician (Otolaryngology)  CHIEF COMPLAINTS/PURPOSE OF VISIT:  Second opinion for high-grade lymphoma  HISTORY OF PRESENTING ILLNESS:  Emily Santiago 55 y.o. female was transferred to my care per patient request, for second opinion.  I reviewed the patient's records extensive and collaborated the history with the patient. Summary of her history is as follows:   Follicular lymphoma grade iiia, lymph nodes of head, face, and neck ([place])   03/08/2016 Imaging    Ct neck showed right level IIa lymphadenopathy at site of palpable interest. Additionally, there are prominent right upper cervical and left supraclavicular lymph nodes. This may represent nodal metastasis or be related to a systemic inflammatory or lymphoproliferative process. No infectious/and process in the neck identified. Effacement of the right vallecula an asymmetric thickening of the right aryepiglottic fold, direct visualization is recommended to evaluate for mucosal lesion.      03/19/2016 Pathology Results    Accession: [id number] Histologic type: Follicular lymphoma, large cell type. Grade (if applicable): High grade, 3 A. Flow cytometry: B cell population with kappa light chain excess ([id number]) Immunohistochemical stains: CD20, CD3, CD10, BCL-2, BCL-6 and Ki-67 with appropriate controls. Touch preps/imprints: A mixture of small and large lymphoid cells. Comments: The sections show effacement of the lymph nodal architecture by numerous variably sized and ill defined atypical lymphoid follicles characterized by lack of polarity, attenuated or absent mantle zones and relatively homogeneous composition of small and large transformed and cleaved lymphoid cells. The number of large lymphoid cells varies in different follicles but generally average more than 15 cells / high power  field . This is associated with scattered mitosis. No diffuse component is identified. To further evaluate this process, flow cytometric analysis was performed and shows a B cell population displaying expression of pan B cell antigens including CD20 associated with CD10 and kappa light chain excess. Immunohistochemical stains were performed and show that the lymphoid follicles are positive for CD20, CD10, BCL-6 and BCL-2. Ki 67 shows variable increased expression ranging for 10% to >50% in many of the follicles. The atypical lymphoid follicles are surrounded by an abundant population of T cells as primarily seen with CD3. 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 (BNS:kh 03-23-16)      03/19/2016 Procedure    Direct laryngoscopy was negative for abnormalities. Biopsies are taken from right neck LN      04/12/2016 PET scan    PET scan showed abnormal right station 2A lymph node measuring 1.1 cm in length with maximum SUV of 6.1, Deauville scale 4. There is some symmetric and probably physiologic activity along the lingual tonsils. Spleen normal, and no other adenopathy identified      The patient acknowledged that she has been having palpable lymph nodes that was just noticeable recently Recently, she felt that the lymph node has gotten progressively larger. It was not improving on antibiotic therapy and she was subsequently referred to ENT for evaluation and biopsy. Since the surgery, she continues to have persistent tenderness in the incision area. The patient appears very nervous today, accompanied by her husband She denies anorexia, abnormal weight loss or other areas of abnormal lymphadenopathy.  MEDICAL HISTORY:  Past Medical History:  Diagnosis Date  . Anxiety   . Hypertension   . Varicose veins     SURGICAL HISTORY: Past Surgical History:  Procedure Laterality Date  .  ABDOMINAL HYSTERECTOMY    . CHOLECYSTECTOMY    . LYMPH NODE BIOPSY    . WRIST SURGERY  Right     SOCIAL HISTORY: Social History   Social History  . Marital status: Married    Spouse name: N/A  . Number of children: N/A  . Years of education: N/A   Occupational History  . Not on file.   Social History Main Topics  . Smoking status: Current Every Day Smoker    Packs/day: 0.50    Types: Cigarettes  . Smokeless tobacco: Never Used  . Alcohol use No  . Drug use: Unknown  . Sexual activity: Not on file   Other Topics Concern  . Not on file   Social History Narrative  . No narrative on file    FAMILY HISTORY: Family History  Problem Relation Age of Onset  . Heart disease Father   . Heart disease Maternal Grandmother     ALLERGIES:  is allergic to lisinopril and penicillins.  MEDICATIONS:  Current Outpatient Prescriptions  Medication Sig Dispense Refill  . escitalopram (LEXAPRO) 10 MG tablet   2  . hydrochlorothiazide (HYDRODIURIL) 12.5 MG tablet Take 12.5 mg by mouth daily.    Marland Kitchen omeprazole (PRILOSEC) 40 MG capsule   4  . valsartan-hydrochlorothiazide (DIOVAN-HCT) 160-25 MG tablet   4  . zolpidem (AMBIEN) 5 MG tablet   2  . nicotine (NICODERM CQ - DOSED IN MG/24 HOURS) 21 mg/24hr patch Place 1 patch (21 mg total) onto the skin daily. 28 patch 0   No current facility-administered medications for this visit.     REVIEW OF SYSTEMS:   Constitutional: Denies fevers, chills or abnormal night sweats Eyes: Denies blurriness of vision, double vision or watery eyes Ears, nose, mouth, throat, and face: Denies mucositis or sore throat Respiratory: Denies cough, dyspnea or wheezes Cardiovascular: Denies palpitation, chest discomfort or lower extremity swelling Gastrointestinal:  Denies nausea, heartburn or change in bowel habits Skin: Denies abnormal skin rashes Neurological:Denies numbness, tingling or new weaknesses Behavioral/Psych: Mood is stable, no new changes  All other systems were reviewed with the patient and are negative.  PHYSICAL  EXAMINATION: ECOG PERFORMANCE STATUS: 1 - Symptomatic but completely ambulatory  Vitals:   04/20/16 1404  BP: (!) 160/70  Pulse: 66  Resp: 20  Temp: 98 F (36.7 C)   Filed Weights   04/20/16 1404  Weight: 142 lb 3.2 oz (64.5 kg)    GENERAL:alert, no distress and comfortable SKIN: skin color, texture, turgor are normal, no rashes or significant lesions EYES: normal, conjunctiva are pink and non-injected, sclera clear OROPHARYNX:no exudate, normal lips, buccal mucosa, and tongue  NECK: supple, thyroid normal size, non-tender, without nodularity LYMPH:  Well-healed surgical scar on the right side of the neck, mild tenderness on palpation LUNGS: clear to auscultation and percussion with normal breathing effort HEART: regular rate & rhythm and no murmurs without lower extremity edema ABDOMEN:abdomen soft, non-tender and normal bowel sounds Musculoskeletal:no cyanosis of digits and no clubbing  PSYCH: alert & oriented x 3 with fluent speech NEURO: no focal motor/sensory deficits  LABORATORY DATA:  I have reviewed the data as listed Lab Results  Component Value Date   WBC 10.1 04/02/2016   HGB 14.1 04/02/2016   HCT 40.8 04/02/2016   MCV 91.3 04/02/2016   PLT 213 04/02/2016    Recent Labs  04/02/16 1411  NA 138  K 3.7  CO2 25  GLUCOSE 81  BUN 12.3  CREATININE 0.7  CALCIUM 9.4  PROT 7.3  ALBUMIN 4.0  AST 32  ALT 34  ALKPHOS 99  BILITOT 0.32    RADIOGRAPHIC STUDIES:I reviewed imaging studies with her and her husband I have personally reviewed the radiological images as listed and agreed with the findings in the report. Nm Pet Image Initial (pi) Skull Base To Thigh  Result Date: 04/12/2016 CLINICAL DATA:  Initial treatment strategy for follicular large-cell lymphoma. EXAM: NUCLEAR MEDICINE PET SKULL BASE TO THIGH TECHNIQUE: 6.9 mCi F-18 FDG was injected intravenously. Full-ring PET imaging was performed from the skull base to thigh after the radiotracer. CT data  was obtained and used for attenuation correction and anatomic localization. FASTING BLOOD GLUCOSE:  Value: 93 mg/dl COMPARISON:  CT scan from 03/08/2016 and 09/15/2015 FINDINGS: NECK Symmetric and likely physiologic activity along the lingual tonsils, maximum SUV 6.0 on the right and 6.3 on the left. A right station 2A lymph node measuring 1.1 cm in short axis has a maximum standard uptake value of 6.1. No significant abnormal glottic activity. CHEST No hypermetabolic mediastinal or hilar nodes. No suspicious pulmonary nodules on the CT data. Background mediastinal blood pool activity maximum SUV 3.3. ABDOMEN/PELVIS No abnormal hypermetabolic activity within the liver, pancreas, adrenal glands, or spleen. No hypermetabolic lymph nodes in the abdomen or pelvis. Background hepatic maximum SUV activity 5.4. Cholecystectomy. Aortoiliac atherosclerotic vascular disease. Faint activity in the stomach antrum is probably physiologic. Hysterectomy.  No adnexal abnormality observed. SKELETON No focal hypermetabolic activity to suggest skeletal metastasis. IMPRESSION: 1. Abnormal right station 2A lymph node measuring 1.1 cm in length with maximum SUV of 6.1, Deauville scale 4. There is some symmetric and probably physiologic activity along the lingual tonsils. Spleen normal, and no other adenopathy identified. 2.  Aortoiliac atherosclerotic vascular disease. Electronically Signed   By: Van Clines M.D.   On: 04/12/2016 14:44    ASSESSMENT & PLAN:  Follicular lymphoma grade iiia, lymph nodes of head, face, and neck ([place]) I had a long discussion with the patient and her husband. I reviewed the pathology slides with the pathologist for over 20 minutes with extensive discussion. The patient has high-grade disease interspersed with some low-grade follicular lymphoma in the background. The description used in the pathology report was large cell follicular lymphoma, high-grade. In some areas, Ki 67 score was greater  than 50%  We reviewed the current guidelines. Clinically, she appears only have stage I disease, localized in the right side of her neck I would recommend we proceed with standard treatment with R-CHOP treatment x 3 cycles followed by PET scan and consolidation treatment with radiation treatment I discussed the rationale for chemotherapy and acknowledge the differences in opinion from his previous oncologist I also offer her another opinion and elsewhere but the patient declined I will see her back in 2 weeks after chemotherapy education class with plan to start treatment after Thanksgiving to allow further healing on the biopsy site Even though clinically the incision wound has healed well but she is still experiencing significant amount of pain  Tobacco abuse I spent some time counseling the patient the importance of tobacco cessation. she is currently attempting to quit on her own  Incisional pain The wound appears to be healing well I recommend over-the-counter analgesics  Hypertension she will continue current medical management. I recommend close follow-up with primary care doctor for medication adjustment.    No orders of the defined types were placed in this encounter.   All questions were answered. The patient knows to call the  clinic with any problems, questions or concerns. I spent 40 minutes counseling the patient face to face. The total time spent in the appointment was 60 minutes and more than 50% was on counseling.     Heath Lark, MD 04/21/2016 9:52 AM

## 2016-04-21 NOTE — Telephone Encounter (Signed)
I had a long discussion with the patient's husband over the phone. I was not able to reach the patient today I discussed with him potential enrollment in clinical trial, with PREVENT study He has a lot of questions regarding the role of clinical trial and the plan for chemotherapy At the end of the day, we decided to give him some time to discuss this with his wife I will get the research coordinator to reach out to her at the end of the week I addressed all his questions and concerns

## 2016-04-21 NOTE — Assessment & Plan Note (Signed)
she will continue current medical management. I recommend close follow-up with primary care doctor for medication adjustment.  

## 2016-04-21 NOTE — Assessment & Plan Note (Signed)
The wound appears to be healing well I recommend over-the-counter analgesics

## 2016-04-21 NOTE — Assessment & Plan Note (Signed)
I spent some time counseling the patient the importance of tobacco cessation. she is currently attempting to quit on her own 

## 2016-04-22 ENCOUNTER — Ambulatory Visit (INDEPENDENT_AMBULATORY_CARE_PROVIDER_SITE_OTHER): Payer: BLUE CROSS/BLUE SHIELD | Admitting: Gastroenterology

## 2016-04-22 ENCOUNTER — Encounter: Payer: Self-pay | Admitting: Gastroenterology

## 2016-04-22 VITALS — BP 100/64 | HR 80 | Ht 61.0 in | Wt 141.6 lb

## 2016-04-22 DIAGNOSIS — K219 Gastro-esophageal reflux disease without esophagitis: Secondary | ICD-10-CM

## 2016-04-22 DIAGNOSIS — R1319 Other dysphagia: Secondary | ICD-10-CM

## 2016-04-22 DIAGNOSIS — R1310 Dysphagia, unspecified: Secondary | ICD-10-CM | POA: Insufficient documentation

## 2016-04-22 NOTE — Progress Notes (Signed)
Thank you for sending this case to me. I have reviewed the entire note, and the outlined plan seems appropriate.  

## 2016-04-22 NOTE — Progress Notes (Addendum)
04/22/2016 [name] [id number] Oct 16, 1960   HISTORY OF PRESENT ILLNESS:  This is a 55 year old female who is new to our practice and was referred here by her oncologist, Dr. Alvy Bimler, for evaluation of difficulty swallowing. The patient was recently diagnosed with follicular lymphoma of the head, face, and neck. Is undergoing chemotherapy. She is here today with complaints of difficulty swallowing solid food, reporting that it gets stuck under her sternum. No issue swallowing liquids, in fact, she has been drinking a lot of protein drinks/supplements. She says that this difficulty swallowing has been occurring over the past year but is worse recently. She admits to long-standing reflux issues as well. Had been taking omeprazole 40 mg daily, but recently switched to OTC Nexium. Still having some symptoms despite that medication.  Has history of colonoscopy with [facility] in 2013. She says that she believes the study was normal and repeat was recommended in 10 years.   Past Medical History:  Diagnosis Date  . Anxiety   . Hypertension   . Lymphoma ([place])   . Varicose veins    Past Surgical History:  Procedure Laterality Date  . ABDOMINAL HYSTERECTOMY    . CHOLECYSTECTOMY    . LYMPH NODE BIOPSY    . WRIST SURGERY Right     reports that she has been smoking Cigarettes.  She has been smoking about 0.50 packs per day. She has never used smokeless tobacco. She reports that she does not drink alcohol or use drugs. family history includes Brain cancer in her brother; Heart disease in her father and maternal grandmother; Liver cancer in her brother; Prostate cancer in her brother and maternal uncle. Allergies  Allergen Reactions  . Lisinopril Cough  . Penicillins Nausea Only    Stomach upset  . Prednisone     "makes me crazy"      Outpatient Encounter Prescriptions as of 04/22/2016  Medication Sig  . escitalopram (LEXAPRO) 10 MG tablet Take 10 mg by mouth daily.   .  hydrochlorothiazide (HYDRODIURIL) 12.5 MG tablet Take 12.5 mg by mouth daily.  . nicotine (NICODERM CQ - DOSED IN MG/24 HOURS) 21 mg/24hr patch Place 1 patch (21 mg total) onto the skin daily.  Marland Kitchen omeprazole (PRILOSEC) 40 MG capsule Take 40 mg by mouth daily.   . valsartan-hydrochlorothiazide (DIOVAN-HCT) 160-25 MG tablet Take 1 tablet by mouth daily.   Marland Kitchen zolpidem (AMBIEN) 5 MG tablet Take 5 mg by mouth at bedtime as needed.    No facility-administered encounter medications on file as of 04/22/2016.      REVIEW OF SYSTEMS  : All other systems reviewed and negative except where noted in the History of Present Illness.   PHYSICAL EXAM: BP 100/64   Pulse 80   Ht '5\' 1"'$  (1.549 m)   Wt 141 lb 9.6 oz (64.2 kg)   BMI 26.76 kg/m  General: Well developed white female in no acute distress Head: Normocephalic and atraumatic Eyes:  Sclerae anicteric, conjunctiva pink. Ears: Normal auditory acuity Lungs: Clear throughout to auscultation Heart: Regular rate and rhythm Abdomen: Soft, non-distended.  Normal bowel sounds.  Non-tender. Musculoskeletal: Symmetrical with no gross deformities  Skin: No lesions on visible extremities Extremities: No edema  Neurological: Alert oriented x 4, grossly non-focal Psychological:  Alert and cooperative. Normal mood and affect.  ASSESSMENT AND PLAN: -Dysphagia to solid foods and GERD:  We'll schedule for EGD with possible dilation to rule out stricture versus esophagitis, etc.  The risks, benefits, and alternatives  to EGD were discussed with the patient and she consents to proceed.  Continue OTC Nexium for now but may need change in medication.  *Will obtain previous colonoscopy records from [facility].  **Received records from [facility], Dr. Michail Sermon. Patient's colonoscopy in November 2013 showed several polyps that were removed, but the pathology shows that these were all hyperplastic polyps. She was also noted to have internal hemorrhoids.  [facility] records  indicate a family history of colon cancer in her father, but she did not indicate that to [place]. We will confirm with the patient.   CC:  Brunetta Genera, MD

## 2016-04-22 NOTE — Patient Instructions (Signed)

## 2016-04-23 ENCOUNTER — Telehealth: Payer: Self-pay | Admitting: Gastroenterology

## 2016-04-23 ENCOUNTER — Encounter: Payer: Self-pay | Admitting: Gastroenterology

## 2016-04-23 ENCOUNTER — Ambulatory Visit (AMBULATORY_SURGERY_CENTER): Payer: BLUE CROSS/BLUE SHIELD | Admitting: Gastroenterology

## 2016-04-23 ENCOUNTER — Other Ambulatory Visit: Payer: Self-pay

## 2016-04-23 VITALS — BP 124/82 | HR 81 | Temp 97.8°F | Resp 18 | Ht 61.0 in | Wt 141.0 lb

## 2016-04-23 DIAGNOSIS — K259 Gastric ulcer, unspecified as acute or chronic, without hemorrhage or perforation: Secondary | ICD-10-CM

## 2016-04-23 DIAGNOSIS — K2950 Unspecified chronic gastritis without bleeding: Secondary | ICD-10-CM | POA: Diagnosis not present

## 2016-04-23 DIAGNOSIS — R1310 Dysphagia, unspecified: Secondary | ICD-10-CM | POA: Diagnosis present

## 2016-04-23 DIAGNOSIS — R1319 Other dysphagia: Secondary | ICD-10-CM

## 2016-04-23 MED ORDER — SODIUM CHLORIDE 0.9 % IV SOLN
500.0000 mL | INTRAVENOUS | Status: DC
Start: 2016-04-23 — End: 2016-05-17

## 2016-04-23 NOTE — Progress Notes (Signed)
Called to room to assist during endoscopic procedure.  Patient ID and intended procedure confirmed with present staff. Received instructions for my participation in the procedure from the performing physician.  

## 2016-04-23 NOTE — Telephone Encounter (Signed)
Left a voice mail for pt to return call. Mailed instructions to pts home address on file.

## 2016-04-23 NOTE — Op Note (Signed)
[facility] Patient Name: Emily Santiago Procedure Date: 04/23/2016 9:28 AM MRN: [id number] Endoscopist: Mallie Mussel L. Loletha Carrow , MD Age: 55 Referring MD:  Date of Birth: 1960-08-29 Gender: Female Account #: [id number] Procedure:                Upper GI endoscopy Indications:              Dysphagia Medicines:                Monitored Anesthesia Care Procedure:                Pre-Anesthesia Assessment:                           - Prior to the procedure, a History and Physical                            was performed, and patient medications and                            allergies were reviewed. The patient's tolerance of                            previous anesthesia was also reviewed. The risks                            and benefits of the procedure and the sedation                            options and risks were discussed with the patient.                            All questions were answered, and informed consent                            was obtained. Prior Anticoagulants: The patient has                            taken no previous anticoagulant or antiplatelet                            agents. ASA Grade Assessment: II - A patient with                            mild systemic disease. After reviewing the risks                            and benefits, the patient was deemed in                            satisfactory condition to undergo the procedure.                           After obtaining informed consent, the endoscope was  passed under direct vision. Throughout the                            procedure, the patient's blood pressure, pulse, and                            oxygen saturations were monitored continuously. The                            Model GIF-[id number]) scope was introduced                            through the mouth, and advanced to the second part                            of duodenum. The upper GI endoscopy was                         accomplished without difficulty. The patient                            tolerated the procedure well. Scope In: Scope Out: Findings:                 The lumen of the upper third of the esophagus and                            middle third of the esophagus was mildly dilated.                            The distal esophagus was mildly tortuous. There was                            reduced motility. There was an irregular Z line and                            a tongue of salmon-colored mucosa less than 1cm in                            length.                           Eight non-bleeding superficial gastric ulcers with                            no stigmata of bleeding were found in the gastric                            antrum. The largest lesion was 5 mm in largest                            dimension. Biopsies were taken with a cold forceps  for histology.                           The cardia and gastric fundus were normal on                            retroflexion.                           Diffuse moderate inflammation characterized by                            adherent blood was found in the entire examined                            stomach.                           The examined duodenum was normal. Complications:            No immediate complications. Estimated Blood Loss:     Estimated blood loss: none. Impression:               - Dilation in the upper third of the esophagus and                            in the middle third of the esophagus.                           - Non-bleeding gastric ulcers with no stigmata of                            bleeding. Biopsied.                           - Gastritis.                           - Normal examined duodenum. Recommendation:           - Patient has a contact number available for                            emergencies. The signs and symptoms of potential                             delayed complications were discussed with the                            patient. Return to normal activities tomorrow.                            Written discharge instructions were provided to the                            patient.                           -  Resume previous diet.                           Smoking cessation                           - Continue present medications.                           - No aspirin, ibuprofen, naproxen, or other                            non-steroidal anti-inflammatory drugs.                           - Await pathology results.                           - Perform ambulatory esophageal manometry at the                            next available appointment. Henry L. Loletha Carrow, MD 04/23/2016 9:55:55 AM This report has been signed electronically. CC Letter to:             Heath Lark, MD

## 2016-04-23 NOTE — Patient Instructions (Signed)
YOU HAD AN ENDOSCOPIC PROCEDURE TODAY AT [facility]:   Refer to the procedure report that was given to you for any specific questions about what was found during the examination.  If the procedure report does not answer your questions, please call your gastroenterologist to clarify.  If you requested that your care partner not be given the details of your procedure findings, then the procedure report has been included in a sealed envelope for you to review at your convenience later.  YOU SHOULD EXPECT: Some feelings of bloating in the abdomen. Passage of more gas than usual.  Walking can help get rid of the air that was put into your GI tract during the procedure and reduce the bloating. If you had a lower endoscopy (such as a colonoscopy or flexible sigmoidoscopy) you may notice spotting of blood in your stool or on the toilet paper. If you underwent a bowel prep for your procedure, you may not have a normal bowel movement for a few days.  Please Note:  You might notice some irritation and congestion in your nose or some drainage.  This is from the oxygen used during your procedure.  There is no need for concern and it should clear up in a day or so.  SYMPTOMS TO REPORT IMMEDIATELY:   Following upper endoscopy (EGD)  Vomiting of blood or coffee ground material  New chest pain or pain under the shoulder blades  Painful or persistently difficult swallowing  New shortness of breath  Fever of 100F or higher  Black, tarry-looking stools  For urgent or emergent issues, a gastroenterologist can be reached at any hour by calling [phone].   DIET:  We do recommend a small meal at first, but then you may proceed to your regular diet.  Drink plenty of fluids but you should avoid alcoholic beverages for 24 hours.  ACTIVITY:  You should plan to take it easy for the rest of today and you should NOT DRIVE or use heavy machinery until tomorrow (because of the sedation medicines used  during the test).   No Ibuprofen, NSAIDS,naproxen .  FOLLOW UP: Our staff will call the number listed on your records the next business day following your procedure to check on you and address any questions or concerns that you may have regarding the information given to you following your procedure. If we do not reach you, we will leave a message.  However, if you are feeling well and you are not experiencing any problems, there is no need to return our call.  We will assume that you have returned to your regular daily activities without incident.  If any biopsies were taken you will be contacted by phone or by letter within the next 1-3 weeks.  Please call us at [phone] if you have not heard about the biopsies in 3 weeks.    SIGNATURES/CONFIDENTIALITY: You and/or your care partner have signed paperwork which will be entered into your electronic medical record.  These signatures attest to the fact that that the information above on your After Visit Summary has been reviewed and is understood.  Full responsibility of the confidentiality of this discharge information lies with you and/or your care-partner.  Thank you for letting us take care of your healthcare needs today.

## 2016-04-23 NOTE — Telephone Encounter (Signed)
Pt is scheduled for the manometry at [facility] on 04-28-2016 @ 1230.

## 2016-04-23 NOTE — Telephone Encounter (Signed)
She called with a sore throat after the EGD today.  No neck tenderness, no fevers or chills.  Eating ok.  I recommended gargle with salt water and tylenol tonight.

## 2016-04-23 NOTE — Telephone Encounter (Signed)
Please schedule this patient for an esophageal manometry test ASAP. - dysphagia

## 2016-04-23 NOTE — Progress Notes (Signed)
To recovery, report to Monday, RN, VSS 

## 2016-04-26 ENCOUNTER — Telehealth: Payer: Self-pay | Admitting: Gastroenterology

## 2016-04-26 ENCOUNTER — Telehealth: Payer: Self-pay | Admitting: *Deleted

## 2016-04-26 ENCOUNTER — Other Ambulatory Visit: Payer: Self-pay

## 2016-04-26 MED ORDER — ONDANSETRON HCL 4 MG PO TABS: 4.0000 mg | ORAL_TABLET | Freq: Four times a day (QID) | ORAL | 1 refills | Status: DC | PRN

## 2016-04-26 NOTE — Telephone Encounter (Signed)
Please send rx for ondansetron 4 mg , one tablet every 6 hours as needed for nausea.  Disp #20, RF 1

## 2016-04-26 NOTE — Telephone Encounter (Signed)
Medication sent to patient's pharmacy.

## 2016-04-26 NOTE — Telephone Encounter (Signed)
Spoke to patient, she is having increased nausea since 11/10, no other complaints. Taking her omeprazole 40 mg once daily. She has been advised she is schedule for her esophageal manometry on 11/15. She has not received written instruction yet in the mail, went over them verbally with her. Please advise.

## 2016-04-26 NOTE — Telephone Encounter (Signed)
  Follow up Call-  Call back number 04/23/2016  Post procedure Call Back phone  # [phone]  Permission to leave phone message Yes  Some recent data might be hidden     Patient questions:  Do you have a fever, pain , or abdominal swelling? No. Pain Score  0 *  Have you tolerated food without any problems? Yes.    Have you been able to return to your normal activities? Yes.    Do you have any questions about your discharge instructions: Diet   No. Medications  No. Follow up visit  Yes.    Do you have questions or concerns about your Care? No.  Actions: * If pain score is 4 or above: No action needed, pain <4. Pt did c/o nausea ,but said these symptoms were the reasons she had procedure . Encouraged and reminded pt to continue Nexium ,that she says she is on, daily 30 minutes before first meal of day until she hears otherwise after biopsy results back

## 2016-04-27 ENCOUNTER — Encounter: Payer: Self-pay | Admitting: *Deleted

## 2016-04-27 ENCOUNTER — Telehealth: Payer: Self-pay | Admitting: Gastroenterology

## 2016-04-27 DIAGNOSIS — C8231 Follicular lymphoma grade IIIa, lymph nodes of head, face, and neck: Secondary | ICD-10-CM

## 2016-04-27 NOTE — Telephone Encounter (Signed)
It is still most likely internal hemorrhoids, which cannot be seen or felt from the outside.  If it continues, use an OTC preparation H suppository (the kind without hydrocortisone) at bedtime for 3 days.  I do not believe she has had a colonoscopy.  However, if the bleeding does not recur, then I would like to wait until after her lymphoma treatment because of how that will affect her immune system.  Please have her see me or Janett Billow in 3-4 weeks so we can check up on the bleeding and review results of the esophageal manometry study after Dr Silverio Decamp has a chance to read it.

## 2016-04-27 NOTE — Telephone Encounter (Signed)
Patient did take the zofran last night, reports today that the nausea is much better. She now reports that she had one formed bm this morning, noticed that there was bright red blood in the stool. She denies hemorrhoids, is not constipated. She does report that she is dizzy if she bends over, no shortness of breath.

## 2016-04-27 NOTE — Telephone Encounter (Signed)
Patient advised to get OTC preparation H suppository, without hydrocortisone, use at bedtime for 3 nights. Appointment made for follow up on 12/4 with Jessica.

## 2016-04-27 NOTE — Progress Notes (Signed)
04/27/2016 1645 PREVENT study Spoke with the patient by phone.  She stated her husband had mentioned to her that Dr. Alvy Bimler called her about the study.  This RN explained the study over the phone.   Plans made to call patient back after 04/29/16.  The patient gave permission for a copy of the PREVENT consent form to be mailed to the patient's house.  This RN thanked the patient for her time. Marcellus Scott, RN, BSN, MHA, OCN

## 2016-04-28 ENCOUNTER — Encounter (HOSPITAL_COMMUNITY)
Admission: RE | Disposition: A | Discharge status: Home / Self Care | Payer: Self-pay | Source: Ambulatory Visit | Attending: Gastroenterology

## 2016-04-28 ENCOUNTER — Ambulatory Visit (HOSPITAL_COMMUNITY)
Admission: RE | Admit: 2016-04-28 | Discharge: 2016-04-28 | Disposition: A | Discharge status: Home / Self Care | Payer: BLUE CROSS/BLUE SHIELD | Source: Ambulatory Visit | Attending: Gastroenterology | Admitting: Gastroenterology

## 2016-04-28 DIAGNOSIS — Z79899 Other long term (current) drug therapy: Secondary | ICD-10-CM | POA: Diagnosis not present

## 2016-04-28 DIAGNOSIS — R1310 Dysphagia, unspecified: Secondary | ICD-10-CM

## 2016-04-28 DIAGNOSIS — Z8572 Personal history of non-Hodgkin lymphomas: Secondary | ICD-10-CM | POA: Insufficient documentation

## 2016-04-28 DIAGNOSIS — I10 Essential (primary) hypertension: Secondary | ICD-10-CM | POA: Diagnosis not present

## 2016-04-28 HISTORY — PX: ESOPHAGEAL MANOMETRY: SHX5429

## 2016-04-28 SURGERY — MANOMETRY, ESOPHAGUS

## 2016-04-28 MED ORDER — LIDOCAINE VISCOUS 2 % MT SOLN
OROMUCOSAL | Status: AC
  Filled 2016-04-28: qty 15

## 2016-04-28 SURGICAL SUPPLY — 2 items
FACESHIELD LNG OPTICON STERILE (SAFETY) IMPLANT
GLOVE BIO SURGEON STRL SZ8 (GLOVE) ×4 IMPLANT

## 2016-04-28 NOTE — H&P (Signed)
History:  This patient presents for endoscopic testing for dysphagia.  Emily Santiago Referring physician: Velna Hatchet, MD  Past Medical History: Past Medical History:  Diagnosis Date  . Anxiety   . Hypertension   . Lymphoma ([place])   . Varicose veins      Past Surgical History: Past Surgical History:  Procedure Laterality Date  . ABDOMINAL HYSTERECTOMY    . CHOLECYSTECTOMY    . LYMPH NODE BIOPSY    . WRIST SURGERY Right     Allergies: Allergies  Allergen Reactions  . Lisinopril Cough  . Penicillins Nausea Only    Stomach upset  . Prednisone     "makes me crazy"    Outpatient Meds: No current facility-administered medications for this encounter.       ___________________________________________________________________ Objective   Exam:  There were no vitals taken for this visit.   There is no physician contact with the patient for this procedure.  It is performed by the endoscopy nurse.     Assessment:  dysphagia  Plan:  Esophageal motility study   Emily Santiago

## 2016-04-29 ENCOUNTER — Encounter: Payer: Self-pay | Admitting: Gastroenterology

## 2016-04-29 ENCOUNTER — Telehealth: Payer: Self-pay | Admitting: Gastroenterology

## 2016-04-29 ENCOUNTER — Encounter (HOSPITAL_COMMUNITY): Payer: Self-pay | Admitting: Gastroenterology

## 2016-04-29 NOTE — Telephone Encounter (Signed)
The pt returned the call and was given the results, she will be expecting a letter and will call with any concerns or questions.

## 2016-04-29 NOTE — Telephone Encounter (Signed)
Left message on machine to call back letter being sent with EGD results.     Dear Ms. Ronnald Ramp,  I am writing to inform you that the biopsies taken during your recent upper endoscopy showed that you had no sign of infection in your stomach.  Please call us if you have persistent problems or have questions about your condition that have not been fully answered at this time.   Sincerely,  Doran Stabler, MD

## 2016-04-30 ENCOUNTER — Other Ambulatory Visit: Payer: Self-pay

## 2016-04-30 ENCOUNTER — Telehealth: Payer: Self-pay | Admitting: *Deleted

## 2016-04-30 DIAGNOSIS — R1310 Dysphagia, unspecified: Secondary | ICD-10-CM

## 2016-04-30 NOTE — Progress Notes (Signed)
Prep mailed to patient.

## 2016-04-30 NOTE — Progress Notes (Signed)
Amb. Ref.

## 2016-04-30 NOTE — Telephone Encounter (Signed)
Pt called stating the neck incisions opened up, and wanted to know what to do.   Spoke with pt and was informed that pt had surgery 2 months ago.  Has 2 incisions on the neck with slow healing.  Per pt, after hugging her mother, pt noticed the incisions split opened.  Denied drainage, denied bleeding, has mild pain.   Asked if pt had contacted her surgeon, pt responded NO.  Pt had just gotten off work.   Instructed pt to go to ER for further evaluation to prevent infection with open wounds.   Pt voiced understanding.

## 2016-05-04 ENCOUNTER — Other Ambulatory Visit: Payer: BLUE CROSS/BLUE SHIELD

## 2016-05-04 ENCOUNTER — Ambulatory Visit (HOSPITAL_BASED_OUTPATIENT_CLINIC_OR_DEPARTMENT_OTHER): Payer: BLUE CROSS/BLUE SHIELD | Admitting: Hematology and Oncology

## 2016-05-04 ENCOUNTER — Encounter: Payer: Self-pay | Admitting: Hematology and Oncology

## 2016-05-04 ENCOUNTER — Telehealth: Payer: Self-pay | Admitting: Hematology and Oncology

## 2016-05-04 ENCOUNTER — Other Ambulatory Visit (HOSPITAL_BASED_OUTPATIENT_CLINIC_OR_DEPARTMENT_OTHER): Payer: BLUE CROSS/BLUE SHIELD

## 2016-05-04 ENCOUNTER — Telehealth: Payer: Self-pay | Admitting: *Deleted

## 2016-05-04 ENCOUNTER — Encounter: Payer: Self-pay | Admitting: *Deleted

## 2016-05-04 VITALS — BP 146/78 | HR 69 | Temp 97.9°F | Resp 18 | Ht 61.0 in | Wt 142.9 lb

## 2016-05-04 DIAGNOSIS — J029 Acute pharyngitis, unspecified: Secondary | ICD-10-CM

## 2016-05-04 DIAGNOSIS — C8231 Follicular lymphoma grade IIIa, lymph nodes of head, face, and neck: Secondary | ICD-10-CM

## 2016-05-04 DIAGNOSIS — R070 Pain in throat: Secondary | ICD-10-CM | POA: Diagnosis not present

## 2016-05-04 DIAGNOSIS — K2100 Gastro-esophageal reflux disease with esophagitis, without bleeding: Secondary | ICD-10-CM

## 2016-05-04 DIAGNOSIS — Z720 Tobacco use: Secondary | ICD-10-CM | POA: Diagnosis not present

## 2016-05-04 LAB — CBC WITH DIFFERENTIAL/PLATELET
BASO%: 0.1 % (ref 0.0–2.0)
Basophils Absolute: 0 10*3/uL (ref 0.0–0.1)
EOS%: 2.3 % (ref 0.0–7.0)
Eosinophils Absolute: 0.2 10*3/uL (ref 0.0–0.5)
HCT: 41.4 % (ref 34.8–46.6)
HGB: 13.9 g/dL (ref 11.6–15.9)
LYMPH%: 27.9 % (ref 14.0–49.7)
MCH: 31 pg (ref 25.1–34.0)
MCHC: 33.6 g/dL (ref 31.5–36.0)
MCV: 92.2 fL (ref 79.5–101.0)
MONO#: 0.5 10*3/uL (ref 0.1–0.9)
MONO%: 6.7 % (ref 0.0–14.0)
NEUT#: 4.7 10*3/uL (ref 1.5–6.5)
NEUT%: 63 % (ref 38.4–76.8)
Platelets: 176 10*3/uL (ref 145–400)
RBC: 4.49 10*6/uL (ref 3.70–5.45)
RDW: 12.9 % (ref 11.2–14.5)
WBC: 7.5 10*3/uL (ref 3.9–10.3)
lymph#: 2.1 10*3/uL (ref 0.9–3.3)

## 2016-05-04 LAB — COMPREHENSIVE METABOLIC PANEL
ALT: 17 U/L (ref 0–55)
AST: 17 U/L (ref 5–34)
Albumin: 3.8 g/dL (ref 3.5–5.0)
Alkaline Phosphatase: 90 U/L (ref 40–150)
Anion Gap: 8 mEq/L (ref 3–11)
BUN: 14.7 mg/dL (ref 7.0–26.0)
CO2: 28 mEq/L (ref 22–29)
Calcium: 9.4 mg/dL (ref 8.4–10.4)
Chloride: 102 mEq/L (ref 98–109)
Creatinine: 0.7 mg/dL (ref 0.6–1.1)
EGFR: >90 mL/min/{1.73_m2} (ref >90)
Glucose: 92 mg/dl (ref 70–140)
Potassium: 4.1 mEq/L (ref 3.5–5.1)
Sodium: 138 mEq/L (ref 136–145)
Total Bilirubin: 0.47 mg/dL (ref 0.20–1.20)
Total Protein: 6.9 g/dL (ref 6.4–8.3)

## 2016-05-04 MED ORDER — ALLOPURINOL 300 MG PO TABS: 300.0000 mg | ORAL_TABLET | Freq: Every day | ORAL | 3 refills | Status: DC

## 2016-05-04 MED ORDER — ONDANSETRON HCL 8 MG PO TABS: 8.0000 mg | ORAL_TABLET | Freq: Three times a day (TID) | ORAL | 1 refills | Status: DC | PRN

## 2016-05-04 MED ORDER — PROCHLORPERAZINE MALEATE 10 MG PO TABS: 10.0000 mg | ORAL_TABLET | Freq: Four times a day (QID) | ORAL | 6 refills | Status: DC | PRN

## 2016-05-04 NOTE — Progress Notes (Signed)
Met with patient and spouse to introduce myself as her Estate manager/land agent. Asked if they have met their ded/OOP for insurance. They have met the deductible but unsure about the OOP. Advised them they can contact their insurance company to find out where they stand and if they are covered at 100% as of now or may be responsible for co-insurance. Gave them information on LLS and how to apply as well as what is needed. Enrolled patient in [place] for [place]. Patient approved. Advised them if they do not need this assistance for the remainder of this year, it will help in 2018 when insurance renews. Patient's spouse verbalized understanding. Discussed [place] and what is needed to apply. Patient has my card for any additional financial questions or concerns.

## 2016-05-04 NOTE — Assessment & Plan Note (Signed)
I congratulated her effort of smoking cessation. She will continue to use nicotine patch as prescribed

## 2016-05-04 NOTE — Telephone Encounter (Signed)
Per LOS I have scheduled appts and notified the scheduler 

## 2016-05-04 NOTE — Telephone Encounter (Signed)
Appointments scheduled per 11/21 LOS. Patient given AVS report and calendars with future scheduled appointments.

## 2016-05-04 NOTE — Progress Notes (Signed)
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 Additionally, there are prominent right upper cervical and left supraclavicular lymph nodes. This may represent nodal metastasis or be related to a systemic inflammatory or lymphoproliferative process. No infectious/and process in the neck identified. Effacement of the right vallecula an asymmetric thickening of the right aryepiglottic fold, direct visualization is recommended to evaluate for mucosal lesion.      03/19/2016 Pathology Results    Accession: [id number] Histologic type: Follicular lymphoma, large cell type. Grade (if applicable): High grade, 3 A. Flow cytometry: B cell population with kappa light chain excess ([id number]) Immunohistochemical stains: CD20, CD3, CD10, BCL-2, BCL-6 and Ki-67 with appropriate controls. Touch preps/imprints: A mixture of small and large lymphoid cells. Comments: The sections show effacement of the lymph nodal architecture by numerous variably sized and ill defined atypical lymphoid follicles characterized by lack of polarity, attenuated or absent mantle zones and relatively homogeneous composition of small and large transformed and cleaved lymphoid cells. The number of large lymphoid cells varies in different follicles but generally average more than 15 cells / high power field . This is associated with scattered mitosis. No diffuse component is identified. To further evaluate this process, flow cytometric analysis was performed and shows a B cell population displaying expression of pan B cell antigens including CD20 associated with CD10 and kappa light chain excess.  Immunohistochemical stains were performed and show that the lymphoid follicles are positive for CD20, CD10, BCL-6 and BCL-2. Ki 67 shows variable increased expression ranging for 10% to >50% in many of the follicles. The atypical lymphoid follicles are surrounded by an abundant population of T cells as primarily seen with CD3. 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 (BNS:kh 03-23-16)      03/19/2016 Procedure    Direct laryngoscopy was negative for abnormalities. Biopsies are taken from right neck LN      04/12/2016 PET scan    PET scan showed abnormal right station 2A lymph node measuring 1.1 cm in length with maximum SUV of 6.1, Deauville scale 4. There is some symmetric and probably physiologic activity along the lingual tonsils. Spleen normal, and no other adenopathy identified       INTERVAL HISTORY: Please see below for problem oriented charting. She returns today for chemotherapy consent. She continues to have tenderness on the right side of the neck and mild sore throat sensation. She is prescribed antibiotic. She is proud of herself for able to reduce the amount of cigarettes to 3 per day, down from 2 packs of cigarette per day. She felt that the nicotine patch is helpful  REVIEW OF SYSTEMS:   Constitutional: Denies fevers, chills or abnormal weight loss Eyes: Denies blurriness of vision Ears, nose, mouth, throat, and face: Denies mucositis or sore throat Respiratory: Denies cough, dyspnea or wheezes Cardiovascular: Denies palpitation, chest discomfort or lower extremity swelling Gastrointestinal:  Denies nausea, heartburn or change in bowel habits Skin: Denies abnormal skin rashes Lymphatics: Denies new lymphadenopathy or easy bruising Neurological:Denies numbness, tingling or new weaknesses Behavioral/Psych: Mood is stable, no new changes  All other systems were reviewed with the patient and are negative.  I have reviewed  the past medical history,  past surgical history, social history and family history with the patient and they are unchanged from previous note.  ALLERGIES:  is allergic to lisinopril; penicillins; and prednisone.  MEDICATIONS:  Current Outpatient Prescriptions  Medication Sig Dispense Refill  . allopurinol (ZYLOPRIM) 300 MG tablet Take 1 tablet (300 mg total) by mouth daily. 30 tablet 3  . escitalopram (LEXAPRO) 10 MG tablet Take 10 mg by mouth daily.   2  . Esomeprazole Magnesium (NEXIUM PO) Take by mouth.    . hydrochlorothiazide (HYDRODIURIL) 12.5 MG tablet Take 12.5 mg by mouth daily.    . nicotine (NICODERM CQ - DOSED IN MG/24 HOURS) 21 mg/24hr patch Place 1 patch (21 mg total) onto the skin daily. (Patient not taking: Reported on 04/23/2016) 28 patch 0  . omeprazole (PRILOSEC) 40 MG capsule Take 40 mg by mouth daily.   4  . ondansetron (ZOFRAN) 4 MG tablet Take 1 tablet (4 mg total) by mouth every 6 (six) hours as needed for nausea or vomiting. 20 tablet 1  . ondansetron (ZOFRAN) 8 MG tablet Take 1 tablet (8 mg total) by mouth every 8 (eight) hours as needed for refractory nausea / vomiting. 30 tablet 1  . prochlorperazine (COMPAZINE) 10 MG tablet Take 1 tablet (10 mg total) by mouth every 6 (six) hours as needed (Nausea or vomiting). 30 tablet 6  . valsartan-hydrochlorothiazide (DIOVAN-HCT) 160-25 MG tablet Take 1 tablet by mouth daily.   4  . zolpidem (AMBIEN) 5 MG tablet Take 5 mg by mouth at bedtime as needed.   2   Current Facility-Administered Medications  Medication Dose Route Frequency Provider Last Rate Last Dose  . 0.9 %  sodium chloride infusion  500 mL Intravenous Continuous [name], MD        PHYSICAL EXAMINATION: ECOG PERFORMANCE STATUS: 1 - Symptomatic but completely ambulatory  Vitals:   05/04/16 1358  BP: (!) 146/78  Pulse: 69  Resp: 18  Temp: 97.9 F (36.6 C)   Filed Weights   05/04/16 1358  Weight: 142 lb 14.4 oz (64.8 kg)    GENERAL:alert, no distress and  comfortable SKIN: skin color, texture, turgor are normal, no rashes or significant lesions EYES: normal, Conjunctiva are pink and non-injected, sclera clear OROPHARYNX:no exudate, no erythema and lips, buccal mucosa, and tongue normal  NECK: She has tenderness to palpation at the incision site  LYMPH:  no palpable lymphadenopathy in the cervical, axillary or inguinal LUNGS: clear to auscultation and percussion with normal breathing effort HEART: regular rate & rhythm and no murmurs and no lower extremity edema ABDOMEN:abdomen soft, non-tender and normal bowel sounds Musculoskeletal:no cyanosis of digits and no clubbing  NEURO: alert & oriented x 3 with fluent speech, no focal motor/sensory deficits  LABORATORY DATA:  I have reviewed the data as listed    Component Value Date/Time   NA 138 05/04/2016 1404   K 4.1 05/04/2016 1404   CL 100 01/22/2014 1655   CO2 28 05/04/2016 1404   GLUCOSE 92 05/04/2016 1404   BUN 14.7 05/04/2016 1404   CREATININE 0.7 05/04/2016 1404   CALCIUM 9.4 05/04/2016 1404   PROT 6.9 05/04/2016 1404   ALBUMIN 3.8 05/04/2016 1404   AST 17 05/04/2016 1404   ALT 17 05/04/2016 1404   ALKPHOS 90 05/04/2016 1404   BILITOT 0.47 05/04/2016 1404   GFRNONAA >90 01/22/2014 1655   GFRAA >90 01/22/2014 1655    No results found for: SPEP, UPEP  Lab  Results  Component Value Date   WBC 7.5 05/04/2016   NEUTROABS 4.7 05/04/2016   HGB 13.9 05/04/2016   HCT 41.4 05/04/2016   MCV 92.2 05/04/2016   PLT 176 05/04/2016      Chemistry      Component Value Date/Time   NA 138 05/04/2016 1404   K 4.1 05/04/2016 1404   CL 100 01/22/2014 1655   CO2 28 05/04/2016 1404   BUN 14.7 05/04/2016 1404   CREATININE 0.7 05/04/2016 1404      Component Value Date/Time   CALCIUM 9.4 05/04/2016 1404   ALKPHOS 90 05/04/2016 1404   AST 17 05/04/2016 1404   ALT 17 05/04/2016 1404   BILITOT 0.47 05/04/2016 1404       ASSESSMENT & PLAN:  Follicular lymphoma grade iiia, lymph  nodes of head, face, and neck ([place]) I have discussed the case at the hematology tumor board. Overall consensus is to proceed with 3  cycles of chemotherapy followed by repeat imaging study and possibly involved field radiation therapy in the future. I explained to the patient and her husband extensively the rationale for pursuing chemotherapy, given the high-grade disease. Given localized disease, I do not feel strongly she needs bone marrow biopsy. Since she will be only receiving 3 rounds of chemotherapy, I would not recommend placement of port. We discussed clinical and the patient is currently not interested  We discussed the role of chemotherapy. The intent is for cure. The decision was made based on publication at the [facility]. It is a category 1 recommendation from NCCN.  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 Alison Stalling J Med 2002; 346:235-242January 24, 2002DOI: 10.1056/[id number]  The rate of complete response was significantly higher in the group that received CHOP plus rituximab than in the group that received CHOP alone (76 percent vs. 63 percent, P=0.005). With a median follow-up of two years, event-free and overall survival times were significantly higher in the CHOP-plus-rituximab group (P<0.001 and P=0.007, respectively). The addition of rituximab to standard CHOP chemotherapy significantly reduced the risk of treatment failure and death (risk ratios, 0.58 [95 percent confidence interval, 0.44 to 0.77] and 0.64 [0.45 to 0.89], respectively). Clinically relevant toxicity was not significantly  greater with CHOP plus rituximab.  We discussed some of the risks, benefits and side-effects of Rituximab,Cytoxan, Adriamycin,Vincristine and Solumedrol/Prednisone.   Some of the short term side-effects included, though not limited to, risk of fatigue, weight loss, tumor lysis syndrome, risk of allergic reactions, pancytopenia, life-threatening infections, need for transfusions of blood products, nausea, vomiting, change in bowel habits, hair loss, risk of congestive heart failure, admission to hospital for various reasons, and risks of death.   Long term side-effects are also discussed including permanent damage to nerve function, chronic fatigue, and rare secondary malignancy including bone marrow disorders.   The patient is aware that the response rates discussed earlier is not guaranteed.    After a long discussion, patient made an informed decision to proceed with the prescribed plan of care and went ahead to sign the consent form today.   Patient education material was dispensed I will draw blood work today in preparation for chemotherapy next week. 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 She will continue to use nicotine patch as prescribed  Gastroesophageal reflux disease The patient was found to have reflux disease and ulcers. She will continue proton pump inhibitor as prescribed  Sore throat She has sensation of sore throat and was prescribed  antibiotic therapy by her primary care doctor. There is no contraindication for her to proceed with treatment as scheduled next week. Currently, she is afebrile with no signs of leukocytosis   No orders of the defined types were placed in this encounter.  All questions were answered. The patient knows to call the clinic with any problems, questions or concerns. No barriers to learning was detected. I spent 25 minutes counseling the patient face to face. The total time spent in the appointment was 55 minutes and more than 50% was on counseling and review of test results     Heath Lark, MD 05/04/2016 5:11 PM

## 2016-05-04 NOTE — Assessment & Plan Note (Signed)
The patient was found to have reflux disease and ulcers. She will continue proton pump inhibitor as prescribed

## 2016-05-04 NOTE — Assessment & Plan Note (Addendum)
I have discussed the case at the hematology tumor board. Overall consensus is to proceed with 3  cycles of chemotherapy followed by repeat imaging study and possibly involved field radiation therapy in the future. I explained to the patient and her husband extensively the rationale for pursuing chemotherapy, given the high-grade disease. Given localized disease, I do not feel strongly she needs bone marrow biopsy. Since she will be only receiving 3 rounds of chemotherapy, I would not recommend placement of port. We discussed clinical and the patient is currently not interested  We discussed the role of chemotherapy. The intent is for cure. The decision was made based on publication at the [facility]. It is a category 1 recommendation from [employer]CCN.  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 Alison Stalling J Med 2002; 346:235-242January 24, 2002DOI: 10.1056/[id number]  The rate of complete response was significantly higher in the group that received CHOP plus rituximab than in the group that received CHOP alone (76 percent vs. 63 percent, P=0.005). With a median follow-up of two years, event-free and overall survival times were significantly higher in the CHOP-plus-rituximab group (P<0.001 and P=0.007, respectively). The addition of rituximab to standard CHOP chemotherapy significantly reduced the risk of treatment failure and death (risk ratios, 0.58 [95 percent confidence interval, 0.44 to 0.77] and 0.64 [0.45 to 0.89], respectively). Clinically relevant toxicity was not significantly greater with CHOP plus rituximab.  We  discussed some of the risks, benefits and side-effects of Rituximab,Cytoxan, Adriamycin,Vincristine and Solumedrol/Prednisone.   Some of the short term side-effects included, though not limited to, risk of fatigue, weight loss, tumor lysis syndrome, risk of allergic reactions, pancytopenia, life-threatening infections, need for transfusions of blood products, nausea, vomiting, change in bowel habits, hair loss, risk of congestive heart failure, admission to hospital for various reasons, and risks of death.   Long term side-effects are also discussed including permanent damage to nerve function, chronic fatigue, and rare secondary malignancy including bone marrow disorders.   The patient is aware that the response rates discussed earlier is not guaranteed.    After a long discussion, patient made an informed decision to proceed with the prescribed plan of care and went ahead to sign the consent form today.   Patient education material was dispensed I will draw blood work today in preparation for chemotherapy next week. 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

## 2016-05-04 NOTE — Assessment & Plan Note (Signed)
She has sensation of sore throat and was prescribed antibiotic therapy by her primary care doctor. There is no contraindication for her to proceed with treatment as scheduled next week. Currently, she is afebrile with no signs of leukocytosis

## 2016-05-05 ENCOUNTER — Telehealth: Payer: Self-pay | Admitting: *Deleted

## 2016-05-05 NOTE — Telephone Encounter (Signed)
Pt left VM states cold symptoms worse with congestion and aching "all over."  She asks if this can be side effect from Allopurinol since she just started that 2 days ago? Called pt back and informed her symptoms are not from Allopurinol.  It sounds like she has a Cold or even the Flu and needs to call her PCP right away or go to Urgent Care clinic.   Pt sounded congested, coughing and sniffling on the phone. She reports chills and body aches.  She has not checked her temperature.  She understands to call her PCP now or go to Urgent Care if needed.

## 2016-05-10 ENCOUNTER — Telehealth: Payer: Self-pay

## 2016-05-10 NOTE — Telephone Encounter (Signed)
Left message on machine to call back  

## 2016-05-10 NOTE — Telephone Encounter (Signed)
-----   Message from Loralie Champagne, PA-C sent at 05/05/2016  2:59 PM EST ----- I saw this patient on November 9. We obtained records from [facility]. Their records show that she has a family history of colon cancer in her father, but she did not indicate that to [place]. Can you please call the patient and confirm whether or not she has a family history of colon cancer because that will affect her colonoscopy recall date.  Thank you,  Jess

## 2016-05-11 ENCOUNTER — Other Ambulatory Visit: Payer: Self-pay | Admitting: Hematology and Oncology

## 2016-05-11 ENCOUNTER — Ambulatory Visit (HOSPITAL_BASED_OUTPATIENT_CLINIC_OR_DEPARTMENT_OTHER): Payer: BLUE CROSS/BLUE SHIELD

## 2016-05-11 ENCOUNTER — Encounter: Payer: Self-pay | Admitting: *Deleted

## 2016-05-11 VITALS — BP 146/81 | HR 84 | Temp 98.4°F | Resp 18

## 2016-05-11 DIAGNOSIS — Z5111 Encounter for antineoplastic chemotherapy: Secondary | ICD-10-CM | POA: Diagnosis not present

## 2016-05-11 DIAGNOSIS — C8231 Follicular lymphoma grade IIIa, lymph nodes of head, face, and neck: Secondary | ICD-10-CM

## 2016-05-11 DIAGNOSIS — Z5112 Encounter for antineoplastic immunotherapy: Secondary | ICD-10-CM

## 2016-05-11 MED ORDER — SODIUM CHLORIDE 0.9 % IV SOLN: Freq: Once | INTRAVENOUS | Status: DC

## 2016-05-11 MED ORDER — VINCRISTINE SULFATE CHEMO INJECTION 1 MG/ML
2.0000 mg | Freq: Once | INTRAVENOUS | Status: AC
  Administered 2016-05-11: 2 mg via INTRAVENOUS
  Filled 2016-05-11: qty 2

## 2016-05-11 MED ORDER — DIPHENHYDRAMINE HCL 25 MG PO CAPS
50.0000 mg | ORAL_CAPSULE | Freq: Once | ORAL | Status: AC
  Administered 2016-05-11: 50 mg via ORAL

## 2016-05-11 MED ORDER — SODIUM CHLORIDE 0.9 % IV SOLN
Freq: Once | INTRAVENOUS | Status: AC
  Administered 2016-05-11: 09:00:00 via INTRAVENOUS

## 2016-05-11 MED ORDER — DIPHENHYDRAMINE HCL 25 MG PO CAPS
ORAL_CAPSULE | ORAL | Status: AC
  Filled 2016-05-11: qty 2

## 2016-05-11 MED ORDER — PALONOSETRON HCL INJECTION 0.25 MG/5ML
0.2500 mg | Freq: Once | INTRAVENOUS | Status: AC
  Administered 2016-05-11: 0.25 mg via INTRAVENOUS

## 2016-05-11 MED ORDER — DOXORUBICIN HCL CHEMO IV INJECTION 2 MG/ML
50.0000 mg/m2 | Freq: Once | INTRAVENOUS | Status: AC
  Administered 2016-05-11: 84 mg via INTRAVENOUS
  Filled 2016-05-11: qty 42

## 2016-05-11 MED ORDER — PALONOSETRON HCL INJECTION 0.25 MG/5ML
INTRAVENOUS | Status: AC
  Filled 2016-05-11: qty 5

## 2016-05-11 MED ORDER — DEXAMETHASONE SODIUM PHOSPHATE 10 MG/ML IJ SOLN
INTRAMUSCULAR | Status: AC
  Filled 2016-05-11: qty 1

## 2016-05-11 MED ORDER — SODIUM CHLORIDE 0.9 % IV SOLN
375.0000 mg/m2 | Freq: Once | INTRAVENOUS | Status: AC
  Administered 2016-05-11: 600 mg via INTRAVENOUS
  Filled 2016-05-11: qty 50

## 2016-05-11 MED ORDER — SODIUM CHLORIDE 0.9 % IV SOLN
750.0000 mg/m2 | Freq: Once | INTRAVENOUS | Status: AC
  Administered 2016-05-11: 1260 mg via INTRAVENOUS
  Filled 2016-05-11: qty 63

## 2016-05-11 MED ORDER — DEXAMETHASONE SODIUM PHOSPHATE 10 MG/ML IJ SOLN
10.0000 mg | Freq: Once | INTRAMUSCULAR | Status: AC
  Administered 2016-05-11: 10 mg via INTRAVENOUS

## 2016-05-11 MED ORDER — PEGFILGRASTIM 6 MG/0.6ML ~~LOC~~ PSKT
6.0000 mg | PREFILLED_SYRINGE | Freq: Once | SUBCUTANEOUS | Status: AC
  Administered 2016-05-11: 6 mg via SUBCUTANEOUS
  Filled 2016-05-11: qty 0.6

## 2016-05-11 MED ORDER — ACETAMINOPHEN 325 MG PO TABS
650.0000 mg | ORAL_TABLET | Freq: Once | ORAL | Status: AC
  Administered 2016-05-11: 650 mg via ORAL

## 2016-05-11 MED ORDER — ACETAMINOPHEN 325 MG PO TABS
ORAL_TABLET | ORAL | Status: AC
  Filled 2016-05-11: qty 2

## 2016-05-11 NOTE — Patient Instructions (Signed)
[facility] Discharge Instructions for Patients Receiving Chemotherapy  Today you received the following chemotherapy agents: Rituximab, Cyclophosphamide, Doxorubicin, and Vincristine  To help prevent nausea and vomiting after your treatment, we encourage you to take your nausea medication as directed.    If you develop nausea and vomiting that is not controlled by your nausea medication, call the clinic.   BELOW ARE SYMPTOMS THAT SHOULD BE REPORTED IMMEDIATELY:  *FEVER GREATER THAN 100.5 F  *CHILLS WITH OR WITHOUT FEVER  NAUSEA AND VOMITING THAT IS NOT CONTROLLED WITH YOUR NAUSEA MEDICATION  *UNUSUAL SHORTNESS OF BREATH  *UNUSUAL BRUISING OR BLEEDING  TENDERNESS IN MOUTH AND THROAT WITH OR WITHOUT PRESENCE OF ULCERS  *URINARY PROBLEMS  *BOWEL PROBLEMS  UNUSUAL RASH Items with * indicate a potential emergency and should be followed up as soon as possible.  Feel free to call the clinic you have any questions or concerns. The clinic phone number is [phone].  Please show the [facility] at check-in to the Emergency Department and triage nurse.  Rituximab injection What is this medicine? RITUXIMAB (ri TUX i mab) is a monoclonal antibody. It is used to treat non-Hodgkin lymphoma and chronic lymphocytic leukemia. It is also used to treat rheumatoid arthritis (RA). In RA, this medicine slows the inflammatory process and help reduce joint pain and swelling. This medicine is often used with other cancer or arthritis medications. This medicine may be used for other purposes; ask your health care provider or pharmacist if you have questions. COMMON BRAND NAME(S): Rituxan What should I tell my health care provider before I take this medicine? They need to know if you have any of these conditions: -heart disease -infection (especially a virus infection such as hepatitis B, chickenpox, cold sores, or herpes) -immune system problems -irregular heartbeat -kidney  disease -lung or breathing disease, like asthma -recently received or scheduled to receive a vaccine -an unusual or allergic reaction to rituximab, mouse proteins, other medicines, foods, dyes, or preservatives -pregnant or trying to get pregnant -breast-feeding How should I use this medicine? This medicine is for infusion into a vein. It is administered in a hospital or clinic by a specially trained health care professional. A special MedGuide will be given to you by the pharmacist with each prescription and refill. Be sure to read this information carefully each time. Talk to your pediatrician regarding the use of this medicine in children. This medicine is not approved for use in children. Overdosage: If you think you have taken too much of this medicine contact a poison control center or emergency room at once. NOTE: This medicine is only for you. Do not share this medicine with others. What if I miss a dose? It is important not to miss a dose. Call your doctor or health care professional if you are unable to keep an appointment. What may interact with this medicine? -cisplatin -medicines for blood pressure -some other medicines for arthritis -vaccines This list may not describe all possible interactions. Give your health care provider a list of all the medicines, herbs, non-prescription drugs, or dietary supplements you use. Also tell them if you smoke, drink alcohol, or use illegal drugs. Some items may interact with your medicine. What should I watch for while using this medicine? Report any side effects that you notice during your treatment right away, such as changes in your breathing, fever, chills, dizziness or lightheadedness. These effects are more common with the first dose. Visit your prescriber or health care professional  for checks on your progress. You will need to have regular blood work. Report any other side effects. The side effects of this medicine can continue after you  finish your treatment. Continue your course of treatment even though you feel ill unless your doctor tells you to stop. Call your doctor or health care professional for advice if you get a fever, chills or sore throat, or other symptoms of a cold or flu. Do not treat yourself. This drug decreases your body's ability to fight infections. Try to avoid being around people who are sick. This medicine may increase your risk to bruise or bleed. Call your doctor or health care professional if you notice any unusual bleeding. Be careful brushing and flossing your teeth or using a toothpick because you may get an infection or bleed more easily. If you have any dental work done, tell your dentist you are receiving this medicine. Avoid taking products that contain aspirin, acetaminophen, ibuprofen, naproxen, or ketoprofen unless instructed by your doctor. These medicines may hide a fever. Do not become pregnant while taking this medicine. Women should inform their doctor if they wish to become pregnant or think they might be pregnant. There is a potential for serious side effects to an unborn child. Talk to your health care professional or pharmacist for more information. Do not breast-feed an infant while taking this medicine. What side effects may I notice from receiving this medicine? Side effects that you should report to your doctor or health care professional as soon as possible: -allergic reactions like skin rash, itching or hives, swelling of the face, lips, or tongue -low blood counts - this medicine may decrease the number of white blood cells, red blood cells and platelets. You may be at increased risk for infections and bleeding. -signs of infection - fever or chills, cough, sore throat, pain or difficulty passing urine -signs of decreased platelets or bleeding - bruising, pinpoint red spots on the skin, black, tarry stools, blood in the urine -signs of decreased red blood cells - unusually weak or  tired, fainting spells, lightheadedness -breathing problems -confused, not responsive -chest pain -fast, irregular heartbeat -feeling faint or lightheaded, falls -mouth sores -redness, blistering, peeling or loosening of the skin, including inside the mouth -stomach pain -swelling of the ankles, feet, or hands -trouble passing urine or change in the amount of urine Side effects that usually do not require medical attention (report to your doctor or health care professional if they continue or are bothersome): -anxiety -headache -loss of appetite -muscle aches -nausea -night sweats This list may not describe all possible side effects. Call your doctor for medical advice about side effects. You may report side effects to FDA at 1-800-FDA-1088. Where should I keep my medicine? This drug is given in a hospital or clinic and will not be stored at home. NOTE: This sheet is a summary. It may not cover all possible information. If you have questions about this medicine, talk to your doctor, pharmacist, or health care provider.  2017 Elsevier/Gold Standard (2015-12-11 17:23:26)  Cyclophosphamide injection What is this medicine? CYCLOPHOSPHAMIDE (sye kloe FOSS fa mide) is a chemotherapy drug. It slows the growth of cancer cells. This medicine is used to treat many types of cancer like lymphoma, myeloma, leukemia, breast cancer, and ovarian cancer, to name a few. This medicine may be used for other purposes; ask your health care provider or pharmacist if you have questions. COMMON BRAND NAME(S): Cytoxan, Neosar What should I tell my health care  provider before I take this medicine? They need to know if you have any of these conditions: -blood disorders -history of other chemotherapy -infection -kidney disease -liver disease -recent or ongoing radiation therapy -tumors in the bone marrow -an unusual or allergic reaction to cyclophosphamide, other chemotherapy, other medicines, foods, dyes,  or preservatives -pregnant or trying to get pregnant -breast-feeding How should I use this medicine? This drug is usually given as an injection into a vein or muscle or by infusion into a vein. It is administered in a hospital or clinic by a specially trained health care professional. Talk to your pediatrician regarding the use of this medicine in children. Special care may be needed. Overdosage: If you think you have taken too much of this medicine contact a poison control center or emergency room at once. NOTE: This medicine is only for you. Do not share this medicine with others. What if I miss a dose? It is important not to miss your dose. Call your doctor or health care professional if you are unable to keep an appointment. What may interact with this medicine? This medicine may interact with the following medications: -amiodarone -amphotericin B -azathioprine -certain antiviral medicines for HIV or AIDS such as protease inhibitors (e.g., indinavir, ritonavir) and zidovudine -certain blood pressure medications such as benazepril, captopril, enalapril, fosinopril, lisinopril, moexipril, monopril, perindopril, quinapril, ramipril, trandolapril -certain cancer medications such as anthracyclines (e.g., daunorubicin, doxorubicin), busulfan, cytarabine, paclitaxel, pentostatin, tamoxifen, trastuzumab -certain diuretics such as chlorothiazide, chlorthalidone, hydrochlorothiazide, indapamide, metolazone -certain medicines that treat or prevent blood clots like warfarin -certain muscle relaxants such as succinylcholine -cyclosporine -etanercept -indomethacin -medicines to increase blood counts like filgrastim, pegfilgrastim, sargramostim -medicines used as general anesthesia -metronidazole -natalizumab This list may not describe all possible interactions. Give your health care provider a list of all the medicines, herbs, non-prescription drugs, or dietary supplements you use. Also tell them  if you smoke, drink alcohol, or use illegal drugs. Some items may interact with your medicine. What should I watch for while using this medicine? Visit your doctor for checks on your progress. This drug may make you feel generally unwell. This is not uncommon, as chemotherapy can affect healthy cells as well as cancer cells. Report any side effects. Continue your course of treatment even though you feel ill unless your doctor tells you to stop. Drink water or other fluids as directed. Urinate often, even at night. In some cases, you may be given additional medicines to help with side effects. Follow all directions for their use. Call your doctor or health care professional for advice if you get a fever, chills or sore throat, or other symptoms of a cold or flu. Do not treat yourself. This drug decreases your body's ability to fight infections. Try to avoid being around people who are sick. This medicine may increase your risk to bruise or bleed. Call your doctor or health care professional if you notice any unusual bleeding. Be careful brushing and flossing your teeth or using a toothpick because you may get an infection or bleed more easily. If you have any dental work done, tell your dentist you are receiving this medicine. You may get drowsy or dizzy. Do not drive, use machinery, or do anything that needs mental alertness until you know how this medicine affects you. Do not become pregnant while taking this medicine or for 1 year after stopping it. Women should inform their doctor if they wish to become pregnant or think they might be pregnant. Men should  not father a child while taking this medicine and for 4 months after stopping it. There is a potential for serious side effects to an unborn child. Talk to your health care professional or pharmacist for more information. Do not breast-feed an infant while taking this medicine. This medicine may interfere with the ability to have a child. This medicine  has caused ovarian failure in some women. This medicine has caused reduced sperm counts in some men. You should talk with your doctor or health care professional if you are concerned about your fertility. If you are going to have surgery, tell your doctor or health care professional that you have taken this medicine. What side effects may I notice from receiving this medicine? Side effects that you should report to your doctor or health care professional as soon as possible: -allergic reactions like skin rash, itching or hives, swelling of the face, lips, or tongue -low blood counts - this medicine may decrease the number of white blood cells, red blood cells and platelets. You may be at increased risk for infections and bleeding. -signs of infection - fever or chills, cough, sore throat, pain or difficulty passing urine -signs of decreased platelets or bleeding - bruising, pinpoint red spots on the skin, black, tarry stools, blood in the urine -signs of decreased red blood cells - unusually weak or tired, fainting spells, lightheadedness -breathing problems -dark urine -dizziness -palpitations -swelling of the ankles, feet, hands -trouble passing urine or change in the amount of urine -weight gain -yellowing of the eyes or skin Side effects that usually do not require medical attention (report to your doctor or health care professional if they continue or are bothersome): -changes in nail or skin color -hair loss -missed menstrual periods -mouth sores -nausea, vomiting This list may not describe all possible side effects. Call your doctor for medical advice about side effects. You may report side effects to FDA at 1-800-FDA-1088. Where should I keep my medicine? This drug is given in a hospital or clinic and will not be stored at home. NOTE: This sheet is a summary. It may not cover all possible information. If you have questions about this medicine, talk to your doctor, pharmacist, or  health care provider.  2017 Elsevier/Gold Standard (2012-04-14 16:22:58)  Doxorubicin injection What is this medicine? DOXORUBICIN (dox oh ROO bi sin) is a chemotherapy drug. It is used to treat many kinds of cancer like leukemia, lymphoma, neuroblastoma, sarcoma, and Wilms' tumor. It is also used to treat bladder cancer, breast cancer, lung cancer, ovarian cancer, stomach cancer, and thyroid cancer. This medicine may be used for other purposes; ask your health care provider or pharmacist if you have questions. COMMON BRAND NAME(S): Adriamycin, Adriamycin PFS, Adriamycin RDF, Rubex What should I tell my health care provider before I take this medicine? They need to know if you have any of these conditions: -heart disease -history of low blood counts caused by a medicine -liver disease -recent or ongoing radiation therapy -an unusual or allergic reaction to doxorubicin, other chemotherapy agents, other medicines, foods, dyes, or preservatives -pregnant or trying to get pregnant -breast-feeding How should I use this medicine? This drug is given as an infusion into a vein. It is administered in a hospital or clinic by a specially trained health care professional. If you have pain, swelling, burning or any unusual feeling around the site of your injection, tell your health care professional right away. Talk to your pediatrician regarding the use of this medicine in  children. Special care may be needed. Overdosage: If you think you have taken too much of this medicine contact a poison control center or emergency room at once. NOTE: This medicine is only for you. Do not share this medicine with others. What if I miss a dose? It is important not to miss your dose. Call your doctor or health care professional if you are unable to keep an appointment. What may interact with this medicine? This medicine may interact with the following medications: -6-mercaptopurine -paclitaxel -phenytoin -St.  John's Wort -trastuzumab -verapamil This list may not describe all possible interactions. Give your health care provider a list of all the medicines, herbs, non-prescription drugs, or dietary supplements you use. Also tell them if you smoke, drink alcohol, or use illegal drugs. Some items may interact with your medicine. What should I watch for while using this medicine? This drug may make you feel generally unwell. This is not uncommon, as chemotherapy can affect healthy cells as well as cancer cells. Report any side effects. Continue your course of treatment even though you feel ill unless your doctor tells you to stop. There is a maximum amount of this medicine you should receive throughout your life. The amount depends on the medical condition being treated and your overall health. Your doctor will watch how much of this medicine you receive in your lifetime. Tell your doctor if you have taken this medicine before. You may need blood work done while you are taking this medicine. Your urine may turn red for a few days after your dose. This is not blood. If your urine is dark or brown, call your doctor. In some cases, you may be given additional medicines to help with side effects. Follow all directions for their use. Call your doctor or health care professional for advice if you get a fever, chills or sore throat, or other symptoms of a cold or flu. Do not treat yourself. This drug decreases your body's ability to fight infections. Try to avoid being around people who are sick. This medicine may increase your risk to bruise or bleed. Call your doctor or health care professional if you notice any unusual bleeding. Talk to your doctor about your risk of cancer. You may be more at risk for certain types of cancers if you take this medicine. Do not become pregnant while taking this medicine or for 6 months after stopping it. Women should inform their doctor if they wish to become pregnant or think they  might be pregnant. Men should not father a child while taking this medicine and for 6 months after stopping it. There is a potential for serious side effects to an unborn child. Talk to your health care professional or pharmacist for more information. Do not breast-feed an infant while taking this medicine. This medicine has caused ovarian failure in some women and reduced sperm counts in some men This medicine may interfere with the ability to have a child. Talk with your doctor or health care professional if you are concerned about your fertility. What side effects may I notice from receiving this medicine? Side effects that you should report to your doctor or health care professional as soon as possible: -allergic reactions like skin rash, itching or hives, swelling of the face, lips, or tongue -breathing problems -chest pain -fast or irregular heartbeat -low blood counts - this medicine may decrease the number of white blood cells, red blood cells and platelets. You may be at increased risk for infections and  bleeding. -pain, redness, or irritation at site where injected -signs of infection - fever or chills, cough, sore throat, pain or difficulty passing urine -signs of decreased platelets or bleeding - bruising, pinpoint red spots on the skin, black, tarry stools, blood in the urine -swelling of the ankles, feet, hands -tiredness -weakness Side effects that usually do not require medical attention (report to your doctor or health care professional if they continue or are bothersome): -diarrhea -hair loss -mouth sores -nail discoloration or damage -nausea -red colored urine -vomiting This list may not describe all possible side effects. Call your doctor for medical advice about side effects. You may report side effects to FDA at 1-800-FDA-1088. Where should I keep my medicine? This drug is given in a hospital or clinic and will not be stored at home. NOTE: This sheet is a summary. It  may not cover all possible information. If you have questions about this medicine, talk to your doctor, pharmacist, or health care provider.  2017 Elsevier/Gold Standard (2015-07-28 11:28:51)  Vincristine injection What is this medicine? VINCRISTINE (vin KRIS teen) is a chemotherapy drug. It slows the growth of cancer cells. This medicine is used to treat many types of cancer like Hodgkin's disease, leukemia, non-Hodgkin's lymphoma, neuroblastoma (brain cancer), rhabdomyosarcoma, and Wilms' tumor. This medicine may be used for other purposes; ask your health care provider or pharmacist if you have questions. COMMON BRAND NAME(S): Oncovin, Vincasar PFS What should I tell my health care provider before I take this medicine? They need to know if you have any of these conditions: -blood disorders -gout -infection (especially chickenpox, cold sores, or herpes) -kidney disease -liver disease -lung disease -nervous system disease like Charcot-Marie-Tooth (CMT) -recent or ongoing radiation therapy -an unusual or allergic reaction to vincristine, other chemotherapy agents, other medicines, foods, dyes, or preservatives -pregnant or trying to get pregnant -breast-feeding How should I use this medicine? This drug is given as an infusion into a vein. It is administered in a hospital or clinic by a specially trained health care professional. If you have pain, swelling, burning, or any unusual feeling around the site of your injection, tell your health care professional right away. Talk to your pediatrician regarding the use of this medicine in children. While this drug may be prescribed for selected conditions, precautions do apply. Overdosage: If you think you have taken too much of this medicine contact a poison control center or emergency room at once. NOTE: This medicine is only for you. Do not share this medicine with others. What if I miss a dose? It is important not to miss your dose. Call your  doctor or health care professional if you are unable to keep an appointment. What may interact with this medicine? Do not take this medicine with any of the following medications: -itraconazole -mibefradil -voriconazole This medicine may also interact with the following medications: -cyclosporine -erythromycin -fluconazole -ketoconazole -medicines for HIV like delavirdine, efavirenz, nevirapine -medicines for seizures like ethotoin, fosphenotoin, phenytoin -medicines to increase blood counts like filgrastim, pegfilgrastim, sargramostim -other chemotherapy drugs like cisplatin, L-asparaginase, methotrexate, mitomycin, paclitaxel -pegaspargase -vaccines -zalcitabine, ddC Talk to your doctor or health care professional before taking any of these medicines: -acetaminophen -aspirin -ibuprofen -ketoprofen -naproxen This list may not describe all possible interactions. Give your health care provider a list of all the medicines, herbs, non-prescription drugs, or dietary supplements you use. Also tell them if you smoke, drink alcohol, or use illegal drugs. Some items may interact with your medicine. What should I  watch for while using this medicine? Your condition will be monitored carefully while you are receiving this medicine. You will need important blood work done while you are taking this medicine. This drug may make you feel generally unwell. This is not uncommon, as chemotherapy can affect healthy cells as well as cancer cells. Report any side effects. Continue your course of treatment even though you feel ill unless your doctor tells you to stop. In some cases, you may be given additional medicines to help with side effects. Follow all directions for their use. Call your doctor or health care professional for advice if you get a fever, chills or sore throat, or other symptoms of a cold or flu. Do not treat yourself. Avoid taking products that contain aspirin, acetaminophen, ibuprofen,  naproxen, or ketoprofen unless instructed by your doctor. These medicines may hide a fever. Do not become pregnant while taking this medicine. Women should inform their doctor if they wish to become pregnant or think they might be pregnant. There is a potential for serious side effects to an unborn child. Talk to your health care professional or pharmacist for more information. Do not breast-feed an infant while taking this medicine. Men may have a lower sperm count while taking this medicine. Talk to your doctor if you plan to father a child. What side effects may I notice from receiving this medicine? Side effects that you should report to your doctor or health care professional as soon as possible: -allergic reactions like skin rash, itching or hives, swelling of the face, lips, or tongue -breathing problems -confusion or changes in emotions or moods -constipation -cough -mouth sores -muscle weakness -nausea and vomiting -pain, swelling, redness or irritation at the injection site -pain, tingling, numbness in the hands or feet -problems with balance, talking, walking -seizures -stomach pain -trouble passing urine or change in the amount of urine Side effects that usually do not require medical attention (report to your doctor or health care professional if they continue or are bothersome): -diarrhea -hair loss -jaw pain -loss of appetite This list may not describe all possible side effects. Call your doctor for medical advice about side effects. You may report side effects to FDA at 1-800-FDA-1088. Where should I keep my medicine? This drug is given in a hospital or clinic and will not be stored at home. NOTE: This sheet is a summary. It may not cover all possible information. If you have questions about this medicine, talk to your doctor, pharmacist, or health care provider.  2017 Elsevier/Gold Standard (2008-02-26 17:17:13)

## 2016-05-12 NOTE — Telephone Encounter (Signed)
Pt does have a family history of colon cancer in her father.  Diagnosed at age 56. She states he had cancer "all over his body"

## 2016-05-12 NOTE — Progress Notes (Signed)
Late entry. 05/11/16 1030: Blood return noted before, every 3 cc during and after Adriamycin push and before and after Vincristine push.

## 2016-05-12 NOTE — Telephone Encounter (Signed)
Left message on machine and at the work number to return call

## 2016-05-12 NOTE — Telephone Encounter (Signed)
Pt aware and recall in EPIC.  Colon cancer history added to fathers history

## 2016-05-12 NOTE — Telephone Encounter (Signed)
Please enter her for colonoscopy recall with Dr. Loletha Carrow 04/2017.  Also, can you please add the family history of colon cancer in her father to her "Family History"?   Thank you,  Jess

## 2016-05-13 ENCOUNTER — Ambulatory Visit: Payer: BLUE CROSS/BLUE SHIELD

## 2016-05-14 ENCOUNTER — Telehealth: Payer: Self-pay | Admitting: *Deleted

## 2016-05-14 ENCOUNTER — Other Ambulatory Visit: Payer: Self-pay | Admitting: Hematology and Oncology

## 2016-05-14 ENCOUNTER — Encounter: Payer: Self-pay | Admitting: Hematology and Oncology

## 2016-05-14 MED ORDER — OXYCODONE HCL 5 MG PO TABS: 5.0000 mg | ORAL_TABLET | ORAL | 0 refills | Status: DC | PRN

## 2016-05-14 NOTE — Telephone Encounter (Signed)
Spoke with pt and informed her that script for Oxycodone is ready for pt to pick up today.  Informed pt to monitor for constipation while taking pain meds.  Reinforced need for increased po fluids  as tolerated.  Pt voiced understanding.

## 2016-05-14 NOTE — Progress Notes (Signed)
Received signed physician form for [employer] from RN, which she had faxed. Placed copy in my co-pay assistance book.

## 2016-05-14 NOTE — Telephone Encounter (Signed)
Pt called to report pain in lower back since receiving chemo on 11/28.   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  Stated she has been taking Claritin as instructed by Dr. Alvy Bimler;  Pt also takes Tylenol for pain - mostly lower back - without relief.  Pt wanted to know what Dr. Alvy Bimler would suggest. Pt's    Phone     [phone].

## 2016-05-14 NOTE — Telephone Encounter (Signed)
I recommend oxycodone for pain the next few days She needs to stop by and pick up the prescription

## 2016-05-17 ENCOUNTER — Other Ambulatory Visit: Payer: Self-pay | Admitting: *Deleted

## 2016-05-17 ENCOUNTER — Telehealth: Payer: Self-pay | Admitting: *Deleted

## 2016-05-17 ENCOUNTER — Encounter: Payer: Self-pay | Admitting: Gastroenterology

## 2016-05-17 ENCOUNTER — Ambulatory Visit (INDEPENDENT_AMBULATORY_CARE_PROVIDER_SITE_OTHER): Payer: BLUE CROSS/BLUE SHIELD | Admitting: Gastroenterology

## 2016-05-17 VITALS — BP 120/60 | HR 80 | Ht 61.0 in | Wt 143.0 lb

## 2016-05-17 DIAGNOSIS — R1310 Dysphagia, unspecified: Secondary | ICD-10-CM | POA: Diagnosis not present

## 2016-05-17 DIAGNOSIS — K219 Gastro-esophageal reflux disease without esophagitis: Secondary | ICD-10-CM | POA: Diagnosis not present

## 2016-05-17 MED ORDER — ESOMEPRAZOLE MAGNESIUM 40 MG PO CPDR: 40.0000 mg | DELAYED_RELEASE_CAPSULE | Freq: Every day | ORAL | 3 refills | Status: DC

## 2016-05-17 MED ORDER — BUTALBITAL-APAP-CAFFEINE 50-325-40 MG PO TABS: 1.0000 | ORAL_TABLET | Freq: Four times a day (QID) | ORAL | 1 refills | Status: DC | PRN

## 2016-05-17 MED ORDER — MAGIC MOUTHWASH W/LIDOCAINE: 5.0000 mL | Freq: Four times a day (QID) | ORAL | 0 refills | Status: DC | PRN

## 2016-05-17 NOTE — Telephone Encounter (Signed)
Patient called requesting medication to help with mouth sores. Patient can be reached at [phone]

## 2016-05-17 NOTE — Telephone Encounter (Signed)
Pt states "my dizziness is worse, head pounding, and back hurts constantly". Tried to take oxycodone for pain, but it made her throw up-"was sick as a dog".  Requesting something else for pain and wants to know what Dr Alvy Bimler thinks could be causing the dizziness.

## 2016-05-17 NOTE — Progress Notes (Addendum)
     05/17/2016 Emily Santiago [id number] [birth year]   History of Present Illness:  Patient is here for follow-up of dysphagia.  Had EGD on 11/10 by Dr. Loletha Carrow.  Findings:    - The lumen of the upper third of the esophagus and middle third of the esophagus was mildly dilated. The distal esophagus was mildly tortuous. There was reduced motility. There was an irregular Z line and a tongue of salmon-colored mucosa less than 1cm in length. - Eight non-bleeding superficial gastric ulcers with no stigmata of bleeding were found in the gastric antrum. The largest lesion was 5 mm in largest dimension. Biopsies were taken with a cold forceps for histology. - The cardia and gastric fundus were normal on retroflexion. - Diffuse moderate inflammation characterized by adherent blood was found in the entire examined stomach. - The examined duodenum was normal.  Gastric biopsies showed mild chronic gastritis without Hpylori.    She had esophageal manometry which was essentially normal.  She reports that her swallowing seems to be better, but now her mouth hurts to chew so she is using magic mouthwash from oncology.  Has been drinking a lot of protein shakes and stuff.  Overall her back hurts and she just aches, which she contributes to the chemo.     Current Medications, Allergies, Past Medical History, Past Surgical History, Family History and Social History were reviewed in [employer] record.   Physical Exam: BP 120/60   Pulse 80   Ht '5\' 1"'$  (1.549 m)   Wt 143 lb (64.9 kg)   BMI 27.02 kg/m  General: Well developed white female in no acute distress Head: Normocephalic and atraumatic Eyes:  Sclerae anicteric, conjunctiva pink  Ears: Normal auditory acuity Mouth:  No thrush visible today. Lungs: Clear throughout to auscultation Heart: Regular rate and rhythm Abdomen: Soft, non-distended.  Normal bowel sounds.  Non-tender. Musculoskeletal: Symmetrical with no gross  deformities  Extremities: No edema  Neurological: Alert oriented x 4, grossly non-focal Psychological:  Alert and cooperative. Normal mood and affect  Assessment and Recommendations: -Dysphagia/GERD:  Improved.  Remain on Nexium 40 mg daily; will refill.  Avoid NSAID's.  Esophageal manometry unremarkable.  Follow-up prn. -Family history of colon cancer in father:  Last colonoscopy 04/2012.  Due 04/2017.  Recall entered.  ___________________________________________________________  Thank you for sending this case to me. I have reviewed the entire note, and the outlined plan seems appropriate.

## 2016-05-17 NOTE — Telephone Encounter (Signed)
Notified of Dr Alvy Bimler message below. Verbalized understanding

## 2016-05-17 NOTE — Patient Instructions (Signed)
We have sent the following medications to your pharmacy for you to pick up at your convenience:  Nexium  

## 2016-05-17 NOTE — Telephone Encounter (Signed)
Dizziness is prob from dehydration in this case We can call in fioricet for headache or she can try caffeine with Tylenol

## 2016-05-17 NOTE — Telephone Encounter (Signed)
Pls call in [employer], [place]

## 2016-05-21 ENCOUNTER — Encounter (HOSPITAL_COMMUNITY): Payer: Self-pay

## 2016-05-21 ENCOUNTER — Telehealth: Payer: Self-pay | Admitting: *Deleted

## 2016-05-21 ENCOUNTER — Other Ambulatory Visit: Payer: Self-pay | Admitting: *Deleted

## 2016-05-21 ENCOUNTER — Encounter: Payer: Self-pay | Admitting: *Deleted

## 2016-05-21 ENCOUNTER — Ambulatory Visit (HOSPITAL_COMMUNITY): Admit: 2016-05-21 | Payer: BLUE CROSS/BLUE SHIELD | Admitting: Gastroenterology

## 2016-05-21 ENCOUNTER — Encounter: Payer: Self-pay | Admitting: Hematology and Oncology

## 2016-05-21 SURGERY — MANOMETRY, ESOPHAGUS

## 2016-05-21 MED ORDER — MIRTAZAPINE 15 MG PO TABS: 15.0000 mg | ORAL_TABLET | Freq: Every day | ORAL | 0 refills | Status: DC

## 2016-05-21 NOTE — Progress Notes (Signed)
Patient and spouse came in to bring forms from [employer] to be faxed. Had patient sign her portion. Physician had previously signed his form and RN had faxed back. Faxed patient portion to [employer]. Fax received ok per confirmation sheet. Advised patient's spouse, [facility] will notify them of the decision via mail and will contact them if anything else is needed. Patient also had concerns about not sleeping and not able to attend the gym. Contacted Tammi RN whom came and assisted the patient with these concerns. Patient has my card for any additional financial questions or concerns.

## 2016-05-21 NOTE — Telephone Encounter (Signed)
Pt states she needs something for sleep, is currently taking 2 ambien at night without any effect.  States she is depressed ( taking lexapro) I asked her if she had ever taken anything for depression and she said "no, I've been depressed ever since my treatment"  Does not feel she is getting relief from pain medicine, but is only using "1/2 tablet occasionally"- discussed taking on regular schedule to see if it helps- can take full tablet.  Is also requesting a letter to [employer] stating she is undergoing treatment at the [facility] and is not able to attend [employer] at this time. (RN will do letter)

## 2016-05-21 NOTE — Telephone Encounter (Signed)
Remeron 15 sent to [place].  Pt has lexapro refills available at [facility].

## 2016-05-24 NOTE — Progress Notes (Signed)
Marland Kitchen    HEMATOLOGY/ONCOLOGY CLINIC NOTE  Date of Service: .04/16/2016  Patient Care Team: Velna Hatchet, MD as PCP - General (Internal Medicine) Leta Baptist, MD as Consulting Physician (Otolaryngology)  CHIEF COMPLAINTS/PURPOSE OF CONSULTATION:  Newly diagnosed follicular large cell lymphoma  HISTORY OF PRESENTING ILLNESS:   Emily Santiago is a wonderful 55 y.o. female who has been referred to [place] by Dr .Emilio Math for evaluation and management of newly diagnosed follicular large cell lymphoma.  Patient has a history of multiple medical comorbidities including extensive smoking, COPD, depression, anxiety who presented to her primary care physician with a knot in the right upper neck that arose over a week. She was empirically treated with clindamycin for possible dental/periodontal infection. The knot continued to increase in size and therefore she was referred to Dr. Benjamine Mola (ENT) . Patient had a CT of her neck on 03/08/2016 which showed Right level IIa lymphadenopathy at site of palpable interest. Additionally, there are prominent right upper cervical and left supraclavicular lymph nodes.  She had excision of one of her right upper cervical lymph nodes on 03/19/2016 by Dr Benjamine Mola and pathology revealed a high-grade 3A follicular lymphoma, large cell type . Most lymphoid follicles averaging more than 15 large cells per high-power field with Ki-67 ranging from 10% to more than 44% in many follicles.  Patient clinically reports that the residual right upper neck lymph nodes are continuing to grow noticeably. She reports no overt weight loss. Has had hot flashes from her menopausal symptoms with intermittent chills. Does not report any other enlarged lymph nodes. No chest pain no overt new shortness of breath. No abdominal pain. No abdominal distention. Patient reports that she had a fatty tumor removed from her left axilla. She is accompanied to this visit by her husband and mother who  help her fill-in the story.  She is very anxious and the family had multiple different questions which were answered to the best of my ability.  INTERVAL HISTORY  Ms Ballow is here for followup of her PET/CT results and to discuss treatment options. She has multiple family members with her including her friends husband mother and other family members. Her PET/CT scan shows stage I disease. We discussed that her pathology showed follicular lymphoma grade 3A and that it was a borderline decision regarding treating this with ISRT alone like a Stage I (Grade 1-2 FL) vs doing R-CHOP chemotherapy x 3 cycles and ISRT (and treating it like a DLBCL) . She is scheduled for second opinion with Dr. Alvy Bimler.   MEDICAL HISTORY:  Past Medical History:  Diagnosis Date  . Anxiety   . Hypertension   . Lymphoma ([place])   . Varicose veins   Extensive history of smoking 1.5 packs per day for 35 years with COPD Irritable bowel syndrome constipation predominant on Amitiza Perimenopausal with hot flashes Depression/anxiety on Lexapro-noncompliant with regular use No known cardiac problems. Restless leg syndrome Varicose veins status post bilateral venous stripping Dysphagia status post esophageal dilatation GERD  SURGICAL HISTORY: Past Surgical History:  Procedure Laterality Date  . ABDOMINAL HYSTERECTOMY    . CHOLECYSTECTOMY    . ESOPHAGEAL MANOMETRY N/A 04/28/2016   Procedure: ESOPHAGEAL MANOMETRY (EM);  Surgeon: Mauri Pole, MD;  Location: [facility];  Service: Endoscopy;  Laterality: N/A;  dr. Loletha Carrow  . LYMPH NODE BIOPSY    . WRIST SURGERY Right     SOCIAL HISTORY: Social History   Social History  . Marital status: Married    Spouse  name: N/A  . Number of children: 0  . Years of education: N/A   Occupational History  . [employer]    Social History Main Topics  . Smoking status: Current Every Day Smoker    Packs/day: 0.50    Years: 35.00    Types: Cigarettes  . Smokeless  tobacco: Never Used     Comment: form given 04-22-16  . Alcohol use No  . Drug use: No  . Sexual activity: Not on file   Other Topics Concern  . Not on file   Social History Narrative  . No narrative on file    FAMILY HISTORY: Family History  Problem Relation Age of Onset  . Heart disease Father   . Colon cancer Father     dx in his 40's  . Heart disease Maternal Grandmother   . Prostate cancer Maternal Uncle   . Brain cancer Brother   . Liver cancer Brother   . Bone cancer Brother   . Prostate cancer Brother   Father lung cancer Paternal grandfather prostate cancer Brother lung cancer with metastases  ALLERGIES:  is allergic to lisinopril; penicillins; oxycodone; and prednisone.  MEDICATIONS:  Current Outpatient Prescriptions  Medication Sig Dispense Refill  . escitalopram (LEXAPRO) 10 MG tablet Take 10 mg by mouth daily.   2  . hydrochlorothiazide (HYDRODIURIL) 12.5 MG tablet Take 12.5 mg by mouth daily.    Marland Kitchen omeprazole (PRILOSEC) 40 MG capsule Take 40 mg by mouth daily.   4  . valsartan-hydrochlorothiazide (DIOVAN-HCT) 160-25 MG tablet Take 1 tablet by mouth daily.   4  . zolpidem (AMBIEN) 5 MG tablet Take 5 mg by mouth at bedtime as needed.   2  . allopurinol (ZYLOPRIM) 300 MG tablet Take 1 tablet (300 mg total) by mouth daily. 30 tablet 3  . butalbital-acetaminophen-caffeine (FIORICET, ESGIC) 50-325-40 MG tablet Take 1-2 tablets by mouth every 6 (six) hours as needed for headache. 30 tablet 1  . esomeprazole (NEXIUM) 40 MG capsule Take 1 capsule (40 mg total) by mouth daily. 90 capsule 3  . magic mouthwash SOLN Take 5 mLs by mouth.    . magic mouthwash w/lidocaine SOLN Take 5 mLs by mouth 4 (four) times daily.    . mirtazapine (REMERON) 15 MG tablet Take 1 tablet (15 mg total) by mouth at bedtime. Stop taking Ambien 30 tablet 0  . nicotine (NICODERM CQ - DOSED IN MG/24 HOURS) 21 mg/24hr patch Place 1 patch (21 mg total) onto the skin daily. 28 patch 0  . ondansetron  (ZOFRAN) 4 MG tablet Take 1 tablet (4 mg total) by mouth every 6 (six) hours as needed for nausea or vomiting. 20 tablet 1  . ondansetron (ZOFRAN) 8 MG tablet Take 1 tablet (8 mg total) by mouth every 8 (eight) hours as needed for refractory nausea / vomiting. 30 tablet 1  . prochlorperazine (COMPAZINE) 10 MG tablet Take 1 tablet (10 mg total) by mouth every 6 (six) hours as needed (Nausea or vomiting). 30 tablet 6   No current facility-administered medications for this visit.     REVIEW OF SYSTEMS:    10 Point review of Systems was done is negative except as noted above.  PHYSICAL EXAMINATION: ECOG PERFORMANCE STATUS: 1 - Symptomatic but completely ambulatory  . Vitals:   04/16/16 1231  BP: (!) 168/83  Pulse: 77  Resp: 16  Temp: 98.1 F (36.7 C)   There were no vitals filed for this visit. .There is no height or weight  on file to calculate BMI.  GENERAL:alert, in no acute distress and comfortable SKIN: skin color, texture, turgor are normal, no rashes or significant lesions EYES: normal, conjunctiva are pink and non-injected, sclera clear OROPHARYNX:no exudate, no erythema and lips, buccal mucosa, and tongue normal  NECK: supple, no JVD, thyroid normal size, non-tender, without nodularity LYMPH:  Palpable right upper cervical and left supraclavicular lymphadenopathy no palpable axillary or inguinal lymphadenopathy noted. LUNGS: clear to auscultation with normal respiratory effort HEART: regular rate & rhythm,  no murmurs and no lower extremity edema ABDOMEN: abdomen soft, non-tender, normoactive bowel sounds , no palpable hepatosplenomegaly Musculoskeletal: no cyanosis of digits and no clubbing  PSYCH: alert & oriented x 3 with fluent speech NEURO: no focal motor/sensory deficits  LABORATORY DATA:  I have reviewed the data as listed  . CBC Latest Ref Rng & Units 05/04/2016 04/02/2016 01/22/2014  WBC 3.9 - 10.3 10e3/uL 7.5 10.1 9.9  Hemoglobin 11.6 - 15.9 g/dL 13.9 14.1  13.2  Hematocrit 34.8 - 46.6 % 41.4 40.8 39.7  Platelets 145 - 400 10e3/uL 176 213 207    . CMP Latest Ref Rng & Units 05/04/2016 04/02/2016 01/22/2014  Glucose 70 - 140 mg/dl 92 81 83  BUN 7.0 - 26.0 mg/dL 14.7 12.3 18  Creatinine 0.6 - 1.1 mg/dL 0.7 0.7 0.61  Sodium 136 - 145 mEq/L 138 138 140  Potassium 3.5 - 5.1 mEq/L 4.1 3.7 4.0  Chloride 96 - 112 mEq/L - - 100  CO2 22 - 29 mEq/L '28 25 27  ' Calcium 8.4 - 10.4 mg/dL 9.4 9.4 9.4  Total Protein 6.4 - 8.3 g/dL 6.9 7.3 6.8  Total Bilirubin 0.20 - 1.20 mg/dL 0.47 0.32 0.3  Alkaline Phos 40 - 150 U/L 90 99 82  AST 5 - 34 U/L 17 32 16  ALT 0 - 55 U/L 17 34 13   Component     Latest Ref Rng & Units 04/02/2016  WBC     3.9 - 10.3 10e3/uL 10.1  NEUT#     1.5 - 6.5 10e3/uL 6.7 (H)  Hemoglobin     11.6 - 15.9 g/dL 14.1  HCT     34.8 - 46.6 % 40.8  Platelets     145 - 400 10e3/uL 213  MCV     79.5 - 101.0 fL 91.3  MCH     25.1 - 34.0 pg 31.5  MCHC     31.5 - 36.0 g/dL 34.6  RBC     3.70 - 5.45 10e6/uL 4.47  RDW     11.2 - 14.5 % 12.8  lymph#     0.9 - 3.3 10e3/uL 2.8  MONO#     0.1 - 0.9 10e3/uL 0.5  Eosinophils Absolute     0.0 - 0.5 10e3/uL 0.1  Basophils Absolute     0.0 - 0.1 10e3/uL 0.0  NEUT%     38.4 - 76.8 % 66.4  LYMPH%     14.0 - 49.7 % 27.6  MONO%     0.0 - 14.0 % 4.5  EOS%     0.0 - 7.0 % 1.4  BASO%     0.0 - 2.0 % 0.1  Retic %     0.70 - 2.10 % 0.97  Retic Ct Abs     33.70 - 90.70 10e3/uL 43.36  Immature Retic Fract     1.60 - 10.00 % 0.80 (L)  Sodium     136 - 145 mEq/L 138  Potassium  3.5 - 5.1 mEq/L 3.7  Chloride     98 - 109 mEq/L 103  CO2     22 - 29 mEq/L 25  Glucose     70 - 140 mg/dl 81  BUN     7.0 - 26.0 mg/dL 12.3  Creatinine     0.6 - 1.1 mg/dL 0.7  Total Bilirubin     0.20 - 1.20 mg/dL 0.32  Alkaline Phosphatase     40 - 150 U/L 99  AST     5 - 34 U/L 32  ALT     0 - 55 U/L 34  Total Protein     6.4 - 8.3 g/dL 7.3  Albumin     3.5 - 5.0 g/dL 4.0  Calcium      8.4 - 10.4 mg/dL 9.4  Anion gap     3 - 11 mEq/L 10  EGFR     >90 ml/min/1.73 m2 >90  LDH     125 - 245 U/L 193  Sed Rate     0 - 40 mm/hr 6  Hep C Virus Ab     0.0 - 0.9 s/co ratio <0.1  Hep B Core Ab, Tot     Negative Negative  Hepatitis B Surface Ag     Negative Negative       RADIOGRAPHIC STUDIES: I have personally reviewed the radiological images as listed and agreed with the findings in the report. NM PET Image Initial (PI) Skull Base To Thigh (Accession [id number]) (Order [id number])  Imaging  Date: 04/12/2016 Department: [facility] Released By: Park Pope Priddy Authorizing: Brunetta Genera, MD  Exam Information   Status Exam Begun  Exam Ended   Final [99] 04/12/2016 12:15 PM 04/12/2016 1:06 PM  PACS Images   Show images for NM PET Image Initial (PI) Skull Base To Thigh  Study Result   CLINICAL DATA:  Initial treatment strategy for follicular large-cell lymphoma.  EXAM: NUCLEAR MEDICINE PET SKULL BASE TO THIGH  TECHNIQUE: 6.9 mCi F-18 FDG was injected intravenously. Full-ring PET imaging was performed from the skull base to thigh after the radiotracer. CT data was obtained and used for attenuation correction and anatomic localization.  FASTING BLOOD GLUCOSE:  Value: 93 mg/dl  COMPARISON:  CT scan from 03/08/2016 and 09/15/2015  FINDINGS: NECK  Symmetric and likely physiologic activity along the lingual tonsils, maximum SUV 6.0 on the right and 6.3 on the left.  A right station 2A lymph node measuring 1.1 cm in short axis has a maximum standard uptake value of 6.1.  No significant abnormal glottic activity.  CHEST  No hypermetabolic mediastinal or hilar nodes. No suspicious pulmonary nodules on the CT data. Background mediastinal blood pool activity maximum SUV 3.3.  ABDOMEN/PELVIS  No abnormal hypermetabolic activity within the liver, pancreas, adrenal glands, or spleen. No  hypermetabolic lymph nodes in the abdomen or pelvis. Background hepatic maximum SUV activity 5.4.  Cholecystectomy. Aortoiliac atherosclerotic vascular disease. Faint activity in the stomach antrum is probably physiologic.  Hysterectomy.  No adnexal abnormality observed.  SKELETON  No focal hypermetabolic activity to suggest skeletal metastasis.  IMPRESSION: 1. Abnormal right station 2A lymph node measuring 1.1 cm in length with maximum SUV of 6.1, Deauville scale 4. There is some symmetric and probably physiologic activity along the lingual tonsils. Spleen normal, and no other adenopathy identified. 2.  Aortoiliac atherosclerotic vascular disease.   Electronically Signed   By: Van Clines M.D.   On: 04/12/2016 14:44    *  [facility] [employer].                        [place], [place] [address]                            [phone]  ------------------------------------------------------------------- Transthoracic Echocardiography  Patient:    Tylisha, Danis MR #:       [id number] Study Date: 04/12/2016 Gender:     F Age:        78 Height:     154.9 cm Weight:     63 kg BSA:        1.66 m^2 Pt. Status: Room:   PERFORMING   [facility], Outpatient  SONOGRAPHER  Darlina Sicilian, RDCS  ATTENDING    Sullivan Lone Kishore  ORDERING     [place], [place] Kishore  REFERRING    [place], [place] Kishore  cc:  ------------------------------------------------------------------- LV EF: 55% -   60%  ------------------------------------------------------------------- Indications:      V58.11 Chemotherapy Evaluation.  ------------------------------------------------------------------- History:   PMH:  Follicular Large Cell Lymphoma.  Risk factors: Hypertension.  ------------------------------------------------------------------- Study Conclusions  - Left ventricle: The cavity size was  normal. There was mild   concentric hypertrophy. Systolic function was normal. The   estimated ejection fraction was in the range of 55% to 60%. Wall   motion was normal; there were no regional wall motion   abnormalities. Left ventricular diastolic function parameters   were normal. - Aortic valve: Trileaflet; normal thickness leaflets. There was no   regurgitation. - Aortic root: The aortic root was normal in size. - Mitral valve: There was trivial regurgitation. - Left atrium: The atrium was normal in size. - Right ventricle: Systolic function was normal. - Right atrium: The atrium was normal in size. - Tricuspid valve: There was no regurgitation. - Pulmonic valve: There was no regurgitation. - Pulmonary arteries: Systolic pressure was within the normal   range. - Inferior vena cava: The vessel was normal in size. - Pericardium, extracardiac: There was no pericardial effusion.  Impressions:  - Normal systolic and diatolic LV function. Strain not performed.  ASSESSMENT & PLAN:   55 year old outpatient female with above-mentioned medical comorbidities with  #1 Newly diagnosed Stage I high-grade Grade 3A follicular large cell lymphoma. Currently noted to have right cervical and left supraclavicular lymphadenopathy. Staging is incomplete at this time. Pathology suggests high-grade 3A. several areas with Ki-67 more than 50% with more than 15 large cells per high-power field in many areas This is anticipated to behave more like a large cell lymphoma than a follicular lymphoma based on pathology and clinical characteristics evident at this time. Patient reports noticeable growth in her cervical lymph nodes over a couple weeks. PET/CT shows Stage I disease ECHO - NL EF Plan -pathology results were discussed with Dr Monica Martinez in details. He was certain that this was FL grade 3A and not Grade 3B. -Most grade 3A follicular lymphomas often function like Grade 1-2 FL and most Grade 3B often  function as large cell lymphomas. -We discussed that she has significant high-risk features with her grade 3A follicular lymphoma and there is no definitive single  treatment option though the choice would be between R-CHOP x 3 cycles followed by ISRT (likely large cell lymphoma) vs ISRT alone (like with FL) -patient and family would like an additional opinion which is quite reasonable. She will see Dr Alvy Bimler in our practice. #2 Depression/anxiety -Patient is supposed to be on Lexapro. She reports that she is taking it on and off. I counseled her against using it this way and recommended she maintain compliance with this.  #3 HTN Extensive history of smoking 1.5 packs per day for 35 years with COPD Irritable bowel syndrome constipation predominant on Amitiza Perimenopausal with hot flashes Depression/anxiety on Lexapro-noncompliant with regular use No known cardiac problems. Restless leg syndrome Varicose veins status post bilateral venous stripping Dysphagia status post esophageal dilatation GERD Plan -Continue follow-up with primary care physician -given referral to GI for EGD and further management.  #4 Cigarette smoking  -Counseled on absolute smoking cessation. -She has not tolerated Chantix in the past due to bad dreams and mood issues -Given information for [facility] quit line.  Patient will continue to f/u with Dr Alvy Bimler.  All of the patients questions were answered with apparent satisfaction. The patient knows to call the clinic with any problems, questions or concerns.  I spent 30 minutes counseling the patient face to face. The total time spent in the appointment was 40 minutes and more than 50% was on counseling and direct patient cares.    Sullivan Lone MD [place] AAHIVMS [facility] Hematology/Oncology Physician [facility]  (Office):       [phone] (Work cell):  [phone] (Fax):           [phone]

## 2016-06-01 ENCOUNTER — Ambulatory Visit (HOSPITAL_BASED_OUTPATIENT_CLINIC_OR_DEPARTMENT_OTHER): Payer: BLUE CROSS/BLUE SHIELD | Admitting: Nurse Practitioner

## 2016-06-01 ENCOUNTER — Encounter: Payer: Self-pay | Admitting: Hematology and Oncology

## 2016-06-01 ENCOUNTER — Ambulatory Visit: Payer: BLUE CROSS/BLUE SHIELD

## 2016-06-01 ENCOUNTER — Telehealth: Payer: Self-pay | Admitting: Hematology and Oncology

## 2016-06-01 ENCOUNTER — Ambulatory Visit (HOSPITAL_BASED_OUTPATIENT_CLINIC_OR_DEPARTMENT_OTHER): Payer: BLUE CROSS/BLUE SHIELD

## 2016-06-01 ENCOUNTER — Ambulatory Visit (HOSPITAL_BASED_OUTPATIENT_CLINIC_OR_DEPARTMENT_OTHER): Payer: BLUE CROSS/BLUE SHIELD | Admitting: Hematology and Oncology

## 2016-06-01 ENCOUNTER — Other Ambulatory Visit (HOSPITAL_BASED_OUTPATIENT_CLINIC_OR_DEPARTMENT_OTHER): Payer: BLUE CROSS/BLUE SHIELD

## 2016-06-01 VITALS — BP 103/61 | HR 75 | Temp 98.2°F | Resp 16

## 2016-06-01 VITALS — BP 123/52 | HR 91 | Temp 98.1°F | Resp 18 | Wt 145.6 lb

## 2016-06-01 DIAGNOSIS — C8231 Follicular lymphoma grade IIIa, lymph nodes of head, face, and neck: Secondary | ICD-10-CM

## 2016-06-01 DIAGNOSIS — Z23 Encounter for immunization: Secondary | ICD-10-CM

## 2016-06-01 DIAGNOSIS — Z720 Tobacco use: Secondary | ICD-10-CM

## 2016-06-01 DIAGNOSIS — I959 Hypotension, unspecified: Secondary | ICD-10-CM

## 2016-06-01 DIAGNOSIS — Z5112 Encounter for antineoplastic immunotherapy: Secondary | ICD-10-CM | POA: Diagnosis not present

## 2016-06-01 DIAGNOSIS — Z5111 Encounter for antineoplastic chemotherapy: Secondary | ICD-10-CM

## 2016-06-01 DIAGNOSIS — M898X9 Other specified disorders of bone, unspecified site: Secondary | ICD-10-CM | POA: Diagnosis not present

## 2016-06-01 DIAGNOSIS — K219 Gastro-esophageal reflux disease without esophagitis: Secondary | ICD-10-CM | POA: Diagnosis not present

## 2016-06-01 LAB — COMPREHENSIVE METABOLIC PANEL
ALT: 10 U/L (ref 0–55)
AST: 13 U/L (ref 5–34)
Albumin: 3.4 g/dL — ABNORMAL LOW (ref 3.5–5.0)
Alkaline Phosphatase: 100 U/L (ref 40–150)
Anion Gap: 9 mEq/L (ref 3–11)
BUN: 11.5 mg/dL (ref 7.0–26.0)
CO2: 26 mEq/L (ref 22–29)
Calcium: 9.4 mg/dL (ref 8.4–10.4)
Chloride: 103 mEq/L (ref 98–109)
Creatinine: 0.7 mg/dL (ref 0.6–1.1)
EGFR: >90 mL/min/{1.73_m2} (ref >90)
Glucose: 68 mg/dl — ABNORMAL LOW (ref 70–140)
Potassium: 4 mEq/L (ref 3.5–5.1)
Sodium: 138 mEq/L (ref 136–145)
Total Bilirubin: 0.34 mg/dL (ref 0.20–1.20)
Total Protein: 6.9 g/dL (ref 6.4–8.3)

## 2016-06-01 LAB — CBC WITH DIFFERENTIAL/PLATELET
BASO%: 0.8 % (ref 0.0–2.0)
Basophils Absolute: 0.1 10*3/uL (ref 0.0–0.1)
EOS%: 0.3 % (ref 0.0–7.0)
Eosinophils Absolute: 0 10*3/uL (ref 0.0–0.5)
HCT: 36.7 % (ref 34.8–46.6)
HGB: 12.4 g/dL (ref 11.6–15.9)
LYMPH%: 12.9 % — ABNORMAL LOW (ref 14.0–49.7)
MCH: 31.1 pg (ref 25.1–34.0)
MCHC: 33.9 g/dL (ref 31.5–36.0)
MCV: 91.7 fL (ref 79.5–101.0)
MONO#: 0.8 10*3/uL (ref 0.1–0.9)
MONO%: 9.6 % (ref 0.0–14.0)
NEUT#: 6.6 10*3/uL — ABNORMAL HIGH (ref 1.5–6.5)
NEUT%: 76.4 % (ref 38.4–76.8)
Platelets: 331 10*3/uL (ref 145–400)
RBC: 4.01 10*6/uL (ref 3.70–5.45)
RDW: 13.3 % (ref 11.2–14.5)
WBC: 8.6 10*3/uL (ref 3.9–10.3)
lymph#: 1.1 10*3/uL (ref 0.9–3.3)

## 2016-06-01 MED ORDER — SODIUM CHLORIDE 0.9 % IV SOLN
Freq: Once | INTRAVENOUS | Status: AC
  Administered 2016-06-01: 10:00:00 via INTRAVENOUS

## 2016-06-01 MED ORDER — INFLUENZA VAC SPLIT QUAD 0.5 ML IM SUSY
0.5000 mL | PREFILLED_SYRINGE | Freq: Once | INTRAMUSCULAR | Status: DC
  Filled 2016-06-01: qty 0.5

## 2016-06-01 MED ORDER — SODIUM CHLORIDE 0.9 % IV SOLN
750.0000 mg/m2 | Freq: Once | INTRAVENOUS | Status: AC
  Administered 2016-06-01: 1260 mg via INTRAVENOUS
  Filled 2016-06-01: qty 63

## 2016-06-01 MED ORDER — OXYCODONE HCL 5 MG PO TABS: 5.0000 mg | ORAL_TABLET | ORAL | 0 refills | Status: DC | PRN

## 2016-06-01 MED ORDER — ACETAMINOPHEN 325 MG PO TABS
ORAL_TABLET | ORAL | Status: AC
Start: 2016-06-01 — End: 2016-06-01
  Filled 2016-06-01: qty 2

## 2016-06-01 MED ORDER — PALONOSETRON HCL INJECTION 0.25 MG/5ML
0.2500 mg | Freq: Once | INTRAVENOUS | Status: AC
  Administered 2016-06-01: 0.25 mg via INTRAVENOUS

## 2016-06-01 MED ORDER — PEGFILGRASTIM 6 MG/0.6ML ~~LOC~~ PSKT
6.0000 mg | PREFILLED_SYRINGE | Freq: Once | SUBCUTANEOUS | Status: AC
  Administered 2016-06-01: 6 mg via SUBCUTANEOUS
  Filled 2016-06-01: qty 0.6

## 2016-06-01 MED ORDER — ACETAMINOPHEN 325 MG PO TABS
650.0000 mg | ORAL_TABLET | Freq: Once | ORAL | Status: AC
  Administered 2016-06-01: 650 mg via ORAL

## 2016-06-01 MED ORDER — SODIUM CHLORIDE 0.9 % IV SOLN
2.0000 mg | Freq: Once | INTRAVENOUS | Status: AC
  Administered 2016-06-01: 2 mg via INTRAVENOUS
  Filled 2016-06-01: qty 2

## 2016-06-01 MED ORDER — DIPHENHYDRAMINE HCL 25 MG PO CAPS
ORAL_CAPSULE | ORAL | Status: AC
  Filled 2016-06-01: qty 2

## 2016-06-01 MED ORDER — DOXORUBICIN HCL CHEMO IV INJECTION 2 MG/ML
50.0000 mg/m2 | Freq: Once | INTRAVENOUS | Status: AC
  Administered 2016-06-01: 84 mg via INTRAVENOUS
  Filled 2016-06-01: qty 42

## 2016-06-01 MED ORDER — INFLUENZA VAC SPLIT QUAD 0.5 ML IM SUSY
0.5000 mL | PREFILLED_SYRINGE | Freq: Once | INTRAMUSCULAR | Status: AC
  Administered 2016-06-01: 0.5 mL via INTRAMUSCULAR
  Filled 2016-06-01: qty 0.5

## 2016-06-01 MED ORDER — DIPHENHYDRAMINE HCL 25 MG PO CAPS
50.0000 mg | ORAL_CAPSULE | Freq: Once | ORAL | Status: AC
  Administered 2016-06-01: 50 mg via ORAL

## 2016-06-01 MED ORDER — PALONOSETRON HCL INJECTION 0.25 MG/5ML
INTRAVENOUS | Status: AC
  Filled 2016-06-01: qty 5

## 2016-06-01 MED ORDER — SODIUM CHLORIDE 0.9 % IV SOLN
375.0000 mg/m2 | Freq: Once | INTRAVENOUS | Status: AC
  Administered 2016-06-01: 600 mg via INTRAVENOUS
  Filled 2016-06-01: qty 50

## 2016-06-01 MED ORDER — SODIUM CHLORIDE 0.9 % IV SOLN
Freq: Once | INTRAVENOUS | Status: AC
  Administered 2016-06-01: 11:00:00 via INTRAVENOUS
  Filled 2016-06-01: qty 5

## 2016-06-01 MED ORDER — NICOTINE 14 MG/24HR TD PT24: 14.0000 mg | MEDICATED_PATCH | Freq: Every day | TRANSDERMAL | 0 refills | Status: DC

## 2016-06-01 NOTE — Progress Notes (Signed)
[place], NP notified. BP 90/56. Rituxan paused.  [place] Selena Lesser, NP at chairside. Consulted Dr. Alvy Bimler regarding pt hypotension. Okay to continue rituxan infusion. Pt and husband in agreement.   [place], NP notified that pt finished with Rituxan. Pt feeling better and VS as follows: BP 103/61, HR 75, SpO2 95%. Berniece Salines okay to allow pt to be discharged and encouraged to push fluids and take BP this evening and in the AM.

## 2016-06-01 NOTE — Patient Instructions (Signed)
[facility] Discharge Instructions for Patients Receiving Chemotherapy  Today you received the following chemotherapy agents: Rituximab, Cyclophosphamide, Doxorubicin, and Vincristine  To help prevent nausea and vomiting after your treatment, we encourage you to take your nausea medication as directed.    If you develop nausea and vomiting that is not controlled by your nausea medication, call the clinic.   BELOW ARE SYMPTOMS THAT SHOULD BE REPORTED IMMEDIATELY:  *FEVER GREATER THAN 100.5 F  *CHILLS WITH OR WITHOUT FEVER  NAUSEA AND VOMITING THAT IS NOT CONTROLLED WITH YOUR NAUSEA MEDICATION  *UNUSUAL SHORTNESS OF BREATH  *UNUSUAL BRUISING OR BLEEDING  TENDERNESS IN MOUTH AND THROAT WITH OR WITHOUT PRESENCE OF ULCERS  *URINARY PROBLEMS  *BOWEL PROBLEMS  UNUSUAL RASH Items with * indicate a potential emergency and should be followed up as soon as possible.  Feel free to call the clinic you have any questions or concerns. The clinic phone number is [phone].  Please show the [facility] at check-in to the Emergency Department and triage nurse.  Rituximab injection What is this medicine? RITUXIMAB (ri TUX i mab) is a monoclonal antibody. It is used to treat non-Hodgkin lymphoma and chronic lymphocytic leukemia. It is also used to treat rheumatoid arthritis (RA). In RA, this medicine slows the inflammatory process and help reduce joint pain and swelling. This medicine is often used with other cancer or arthritis medications. This medicine may be used for other purposes; ask your health care provider or pharmacist if you have questions. COMMON BRAND NAME(S): Rituxan What should I tell my health care provider before I take this medicine? They need to know if you have any of these conditions: -heart disease -infection (especially a virus infection such as hepatitis B, chickenpox, cold sores, or herpes) -immune system problems -irregular heartbeat -kidney  disease -lung or breathing disease, like asthma -recently received or scheduled to receive a vaccine -an unusual or allergic reaction to rituximab, mouse proteins, other medicines, foods, dyes, or preservatives -pregnant or trying to get pregnant -breast-feeding How should I use this medicine? This medicine is for infusion into a vein. It is administered in a hospital or clinic by a specially trained health care professional. A special MedGuide will be given to you by the pharmacist with each prescription and refill. Be sure to read this information carefully each time. Talk to your pediatrician regarding the use of this medicine in children. This medicine is not approved for use in children. Overdosage: If you think you have taken too much of this medicine contact a poison control center or emergency room at once. NOTE: This medicine is only for you. Do not share this medicine with others. What if I miss a dose? It is important not to miss a dose. Call your doctor or health care professional if you are unable to keep an appointment. What may interact with this medicine? -cisplatin -medicines for blood pressure -some other medicines for arthritis -vaccines This list may not describe all possible interactions. Give your health care provider a list of all the medicines, herbs, non-prescription drugs, or dietary supplements you use. Also tell them if you smoke, drink alcohol, or use illegal drugs. Some items may interact with your medicine. What should I watch for while using this medicine? Report any side effects that you notice during your treatment right away, such as changes in your breathing, fever, chills, dizziness or lightheadedness. These effects are more common with the first dose. Visit your prescriber or health care professional  for checks on your progress. You will need to have regular blood work. Report any other side effects. The side effects of this medicine can continue after you  finish your treatment. Continue your course of treatment even though you feel ill unless your doctor tells you to stop. Call your doctor or health care professional for advice if you get a fever, chills or sore throat, or other symptoms of a cold or flu. Do not treat yourself. This drug decreases your body's ability to fight infections. Try to avoid being around people who are sick. This medicine may increase your risk to bruise or bleed. Call your doctor or health care professional if you notice any unusual bleeding. Be careful brushing and flossing your teeth or using a toothpick because you may get an infection or bleed more easily. If you have any dental work done, tell your dentist you are receiving this medicine. Avoid taking products that contain aspirin, acetaminophen, ibuprofen, naproxen, or ketoprofen unless instructed by your doctor. These medicines may hide a fever. Do not become pregnant while taking this medicine. Women should inform their doctor if they wish to become pregnant or think they might be pregnant. There is a potential for serious side effects to an unborn child. Talk to your health care professional or pharmacist for more information. Do not breast-feed an infant while taking this medicine. What side effects may I notice from receiving this medicine? Side effects that you should report to your doctor or health care professional as soon as possible: -allergic reactions like skin rash, itching or hives, swelling of the face, lips, or tongue -low blood counts - this medicine may decrease the number of white blood cells, red blood cells and platelets. You may be at increased risk for infections and bleeding. -signs of infection - fever or chills, cough, sore throat, pain or difficulty passing urine -signs of decreased platelets or bleeding - bruising, pinpoint red spots on the skin, black, tarry stools, blood in the urine -signs of decreased red blood cells - unusually weak or  tired, fainting spells, lightheadedness -breathing problems -confused, not responsive -chest pain -fast, irregular heartbeat -feeling faint or lightheaded, falls -mouth sores -redness, blistering, peeling or loosening of the skin, including inside the mouth -stomach pain -swelling of the ankles, feet, or hands -trouble passing urine or change in the amount of urine Side effects that usually do not require medical attention (report to your doctor or health care professional if they continue or are bothersome): -anxiety -headache -loss of appetite -muscle aches -nausea -night sweats This list may not describe all possible side effects. Call your doctor for medical advice about side effects. You may report side effects to FDA at 1-800-FDA-1088. Where should I keep my medicine? This drug is given in a hospital or clinic and will not be stored at home. NOTE: This sheet is a summary. It may not cover all possible information. If you have questions about this medicine, talk to your doctor, pharmacist, or health care provider.  2017 Elsevier/Gold Standard (2015-12-11 17:23:26)  Cyclophosphamide injection What is this medicine? CYCLOPHOSPHAMIDE (sye kloe FOSS fa mide) is a chemotherapy drug. It slows the growth of cancer cells. This medicine is used to treat many types of cancer like lymphoma, myeloma, leukemia, breast cancer, and ovarian cancer, to name a few. This medicine may be used for other purposes; ask your health care provider or pharmacist if you have questions. COMMON BRAND NAME(S): Cytoxan, Neosar What should I tell my health care  provider before I take this medicine? They need to know if you have any of these conditions: -blood disorders -history of other chemotherapy -infection -kidney disease -liver disease -recent or ongoing radiation therapy -tumors in the bone marrow -an unusual or allergic reaction to cyclophosphamide, other chemotherapy, other medicines, foods, dyes,  or preservatives -pregnant or trying to get pregnant -breast-feeding How should I use this medicine? This drug is usually given as an injection into a vein or muscle or by infusion into a vein. It is administered in a hospital or clinic by a specially trained health care professional. Talk to your pediatrician regarding the use of this medicine in children. Special care may be needed. Overdosage: If you think you have taken too much of this medicine contact a poison control center or emergency room at once. NOTE: This medicine is only for you. Do not share this medicine with others. What if I miss a dose? It is important not to miss your dose. Call your doctor or health care professional if you are unable to keep an appointment. What may interact with this medicine? This medicine may interact with the following medications: -amiodarone -amphotericin B -azathioprine -certain antiviral medicines for HIV or AIDS such as protease inhibitors (e.g., indinavir, ritonavir) and zidovudine -certain blood pressure medications such as benazepril, captopril, enalapril, fosinopril, lisinopril, moexipril, monopril, perindopril, quinapril, ramipril, trandolapril -certain cancer medications such as anthracyclines (e.g., daunorubicin, doxorubicin), busulfan, cytarabine, paclitaxel, pentostatin, tamoxifen, trastuzumab -certain diuretics such as chlorothiazide, chlorthalidone, hydrochlorothiazide, indapamide, metolazone -certain medicines that treat or prevent blood clots like warfarin -certain muscle relaxants such as succinylcholine -cyclosporine -etanercept -indomethacin -medicines to increase blood counts like filgrastim, pegfilgrastim, sargramostim -medicines used as general anesthesia -metronidazole -natalizumab This list may not describe all possible interactions. Give your health care provider a list of all the medicines, herbs, non-prescription drugs, or dietary supplements you use. Also tell them  if you smoke, drink alcohol, or use illegal drugs. Some items may interact with your medicine. What should I watch for while using this medicine? Visit your doctor for checks on your progress. This drug may make you feel generally unwell. This is not uncommon, as chemotherapy can affect healthy cells as well as cancer cells. Report any side effects. Continue your course of treatment even though you feel ill unless your doctor tells you to stop. Drink water or other fluids as directed. Urinate often, even at night. In some cases, you may be given additional medicines to help with side effects. Follow all directions for their use. Call your doctor or health care professional for advice if you get a fever, chills or sore throat, or other symptoms of a cold or flu. Do not treat yourself. This drug decreases your body's ability to fight infections. Try to avoid being around people who are sick. This medicine may increase your risk to bruise or bleed. Call your doctor or health care professional if you notice any unusual bleeding. Be careful brushing and flossing your teeth or using a toothpick because you may get an infection or bleed more easily. If you have any dental work done, tell your dentist you are receiving this medicine. You may get drowsy or dizzy. Do not drive, use machinery, or do anything that needs mental alertness until you know how this medicine affects you. Do not become pregnant while taking this medicine or for 1 year after stopping it. Women should inform their doctor if they wish to become pregnant or think they might be pregnant. Men should  not father a child while taking this medicine and for 4 months after stopping it. There is a potential for serious side effects to an unborn child. Talk to your health care professional or pharmacist for more information. Do not breast-feed an infant while taking this medicine. This medicine may interfere with the ability to have a child. This medicine  has caused ovarian failure in some women. This medicine has caused reduced sperm counts in some men. You should talk with your doctor or health care professional if you are concerned about your fertility. If you are going to have surgery, tell your doctor or health care professional that you have taken this medicine. What side effects may I notice from receiving this medicine? Side effects that you should report to your doctor or health care professional as soon as possible: -allergic reactions like skin rash, itching or hives, swelling of the face, lips, or tongue -low blood counts - this medicine may decrease the number of white blood cells, red blood cells and platelets. You may be at increased risk for infections and bleeding. -signs of infection - fever or chills, cough, sore throat, pain or difficulty passing urine -signs of decreased platelets or bleeding - bruising, pinpoint red spots on the skin, black, tarry stools, blood in the urine -signs of decreased red blood cells - unusually weak or tired, fainting spells, lightheadedness -breathing problems -dark urine -dizziness -palpitations -swelling of the ankles, feet, hands -trouble passing urine or change in the amount of urine -weight gain -yellowing of the eyes or skin Side effects that usually do not require medical attention (report to your doctor or health care professional if they continue or are bothersome): -changes in nail or skin color -hair loss -missed menstrual periods -mouth sores -nausea, vomiting This list may not describe all possible side effects. Call your doctor for medical advice about side effects. You may report side effects to FDA at 1-800-FDA-1088. Where should I keep my medicine? This drug is given in a hospital or clinic and will not be stored at home. NOTE: This sheet is a summary. It may not cover all possible information. If you have questions about this medicine, talk to your doctor, pharmacist, or  health care provider.  2017 Elsevier/Gold Standard (2012-04-14 16:22:58)  Doxorubicin injection What is this medicine? DOXORUBICIN (dox oh ROO bi sin) is a chemotherapy drug. It is used to treat many kinds of cancer like leukemia, lymphoma, neuroblastoma, sarcoma, and Wilms' tumor. It is also used to treat bladder cancer, breast cancer, lung cancer, ovarian cancer, stomach cancer, and thyroid cancer. This medicine may be used for other purposes; ask your health care provider or pharmacist if you have questions. COMMON BRAND NAME(S): Adriamycin, Adriamycin PFS, Adriamycin RDF, Rubex What should I tell my health care provider before I take this medicine? They need to know if you have any of these conditions: -heart disease -history of low blood counts caused by a medicine -liver disease -recent or ongoing radiation therapy -an unusual or allergic reaction to doxorubicin, other chemotherapy agents, other medicines, foods, dyes, or preservatives -pregnant or trying to get pregnant -breast-feeding How should I use this medicine? This drug is given as an infusion into a vein. It is administered in a hospital or clinic by a specially trained health care professional. If you have pain, swelling, burning or any unusual feeling around the site of your injection, tell your health care professional right away. Talk to your pediatrician regarding the use of this medicine in  children. Special care may be needed. Overdosage: If you think you have taken too much of this medicine contact a poison control center or emergency room at once. NOTE: This medicine is only for you. Do not share this medicine with others. What if I miss a dose? It is important not to miss your dose. Call your doctor or health care professional if you are unable to keep an appointment. What may interact with this medicine? This medicine may interact with the following medications: -6-mercaptopurine -paclitaxel -phenytoin -St.  John's Wort -trastuzumab -verapamil This list may not describe all possible interactions. Give your health care provider a list of all the medicines, herbs, non-prescription drugs, or dietary supplements you use. Also tell them if you smoke, drink alcohol, or use illegal drugs. Some items may interact with your medicine. What should I watch for while using this medicine? This drug may make you feel generally unwell. This is not uncommon, as chemotherapy can affect healthy cells as well as cancer cells. Report any side effects. Continue your course of treatment even though you feel ill unless your doctor tells you to stop. There is a maximum amount of this medicine you should receive throughout your life. The amount depends on the medical condition being treated and your overall health. Your doctor will watch how much of this medicine you receive in your lifetime. Tell your doctor if you have taken this medicine before. You may need blood work done while you are taking this medicine. Your urine may turn red for a few days after your dose. This is not blood. If your urine is dark or brown, call your doctor. In some cases, you may be given additional medicines to help with side effects. Follow all directions for their use. Call your doctor or health care professional for advice if you get a fever, chills or sore throat, or other symptoms of a cold or flu. Do not treat yourself. This drug decreases your body's ability to fight infections. Try to avoid being around people who are sick. This medicine may increase your risk to bruise or bleed. Call your doctor or health care professional if you notice any unusual bleeding. Talk to your doctor about your risk of cancer. You may be more at risk for certain types of cancers if you take this medicine. Do not become pregnant while taking this medicine or for 6 months after stopping it. Women should inform their doctor if they wish to become pregnant or think they  might be pregnant. Men should not father a child while taking this medicine and for 6 months after stopping it. There is a potential for serious side effects to an unborn child. Talk to your health care professional or pharmacist for more information. Do not breast-feed an infant while taking this medicine. This medicine has caused ovarian failure in some women and reduced sperm counts in some men This medicine may interfere with the ability to have a child. Talk with your doctor or health care professional if you are concerned about your fertility. What side effects may I notice from receiving this medicine? Side effects that you should report to your doctor or health care professional as soon as possible: -allergic reactions like skin rash, itching or hives, swelling of the face, lips, or tongue -breathing problems -chest pain -fast or irregular heartbeat -low blood counts - this medicine may decrease the number of white blood cells, red blood cells and platelets. You may be at increased risk for infections and  bleeding. -pain, redness, or irritation at site where injected -signs of infection - fever or chills, cough, sore throat, pain or difficulty passing urine -signs of decreased platelets or bleeding - bruising, pinpoint red spots on the skin, black, tarry stools, blood in the urine -swelling of the ankles, feet, hands -tiredness -weakness Side effects that usually do not require medical attention (report to your doctor or health care professional if they continue or are bothersome): -diarrhea -hair loss -mouth sores -nail discoloration or damage -nausea -red colored urine -vomiting This list may not describe all possible side effects. Call your doctor for medical advice about side effects. You may report side effects to FDA at 1-800-FDA-1088. Where should I keep my medicine? This drug is given in a hospital or clinic and will not be stored at home. NOTE: This sheet is a summary. It  may not cover all possible information. If you have questions about this medicine, talk to your doctor, pharmacist, or health care provider.  2017 Elsevier/Gold Standard (2015-07-28 11:28:51)  Vincristine injection What is this medicine? VINCRISTINE (vin KRIS teen) is a chemotherapy drug. It slows the growth of cancer cells. This medicine is used to treat many types of cancer like Hodgkin's disease, leukemia, non-Hodgkin's lymphoma, neuroblastoma (brain cancer), rhabdomyosarcoma, and Wilms' tumor. This medicine may be used for other purposes; ask your health care provider or pharmacist if you have questions. COMMON BRAND NAME(S): Oncovin, Vincasar PFS What should I tell my health care provider before I take this medicine? They need to know if you have any of these conditions: -blood disorders -gout -infection (especially chickenpox, cold sores, or herpes) -kidney disease -liver disease -lung disease -nervous system disease like Charcot-Marie-Tooth (CMT) -recent or ongoing radiation therapy -an unusual or allergic reaction to vincristine, other chemotherapy agents, other medicines, foods, dyes, or preservatives -pregnant or trying to get pregnant -breast-feeding How should I use this medicine? This drug is given as an infusion into a vein. It is administered in a hospital or clinic by a specially trained health care professional. If you have pain, swelling, burning, or any unusual feeling around the site of your injection, tell your health care professional right away. Talk to your pediatrician regarding the use of this medicine in children. While this drug may be prescribed for selected conditions, precautions do apply. Overdosage: If you think you have taken too much of this medicine contact a poison control center or emergency room at once. NOTE: This medicine is only for you. Do not share this medicine with others. What if I miss a dose? It is important not to miss your dose. Call your  doctor or health care professional if you are unable to keep an appointment. What may interact with this medicine? Do not take this medicine with any of the following medications: -itraconazole -mibefradil -voriconazole This medicine may also interact with the following medications: -cyclosporine -erythromycin -fluconazole -ketoconazole -medicines for HIV like delavirdine, efavirenz, nevirapine -medicines for seizures like ethotoin, fosphenotoin, phenytoin -medicines to increase blood counts like filgrastim, pegfilgrastim, sargramostim -other chemotherapy drugs like cisplatin, L-asparaginase, methotrexate, mitomycin, paclitaxel -pegaspargase -vaccines -zalcitabine, ddC Talk to your doctor or health care professional before taking any of these medicines: -acetaminophen -aspirin -ibuprofen -ketoprofen -naproxen This list may not describe all possible interactions. Give your health care provider a list of all the medicines, herbs, non-prescription drugs, or dietary supplements you use. Also tell them if you smoke, drink alcohol, or use illegal drugs. Some items may interact with your medicine. What should I  watch for while using this medicine? Your condition will be monitored carefully while you are receiving this medicine. You will need important blood work done while you are taking this medicine. This drug may make you feel generally unwell. This is not uncommon, as chemotherapy can affect healthy cells as well as cancer cells. Report any side effects. Continue your course of treatment even though you feel ill unless your doctor tells you to stop. In some cases, you may be given additional medicines to help with side effects. Follow all directions for their use. Call your doctor or health care professional for advice if you get a fever, chills or sore throat, or other symptoms of a cold or flu. Do not treat yourself. Avoid taking products that contain aspirin, acetaminophen, ibuprofen,  naproxen, or ketoprofen unless instructed by your doctor. These medicines may hide a fever. Do not become pregnant while taking this medicine. Women should inform their doctor if they wish to become pregnant or think they might be pregnant. There is a potential for serious side effects to an unborn child. Talk to your health care professional or pharmacist for more information. Do not breast-feed an infant while taking this medicine. Men may have a lower sperm count while taking this medicine. Talk to your doctor if you plan to father a child. What side effects may I notice from receiving this medicine? Side effects that you should report to your doctor or health care professional as soon as possible: -allergic reactions like skin rash, itching or hives, swelling of the face, lips, or tongue -breathing problems -confusion or changes in emotions or moods -constipation -cough -mouth sores -muscle weakness -nausea and vomiting -pain, swelling, redness or irritation at the injection site -pain, tingling, numbness in the hands or feet -problems with balance, talking, walking -seizures -stomach pain -trouble passing urine or change in the amount of urine Side effects that usually do not require medical attention (report to your doctor or health care professional if they continue or are bothersome): -diarrhea -hair loss -jaw pain -loss of appetite This list may not describe all possible side effects. Call your doctor for medical advice about side effects. You may report side effects to FDA at 1-800-FDA-1088. Where should I keep my medicine? This drug is given in a hospital or clinic and will not be stored at home. NOTE: This sheet is a summary. It may not cover all possible information. If you have questions about this medicine, talk to your doctor, pharmacist, or health care provider.  2017 Elsevier/Gold Standard (2008-02-26 17:17:13)

## 2016-06-01 NOTE — Progress Notes (Signed)
Met with patient and spouse to go over financial assistance. Patient approved for one-time [facility] $400 grant. Patient has a copy of approval as well as expense sheet along with the Outpatient pharmacy information. Patient submitted a bill to be paid from grant. Patient also signed application for [facility] for possible co-pay assistance with Rituxan. Faxed to [place] along with physician form. Fax received ok per confirmation sheet. Advised patient they will notify myself, her or both once decision has been made. Patient has my card for any additional financial questions or concerns.

## 2016-06-01 NOTE — Progress Notes (Signed)
During Rituxan infusion, patient began c/o dizziness and "feeling hot."  Patient hypotensive. Cyndee Bacon notified, Rituxan paused, and NS bolus initiated. Patient states she took antihypertensive after arriving to clinic today. Cyndee Berniece Salines advised patient to hold antihypertensive on treatment days until after treatment. Patient verbalized understanding.

## 2016-06-01 NOTE — Telephone Encounter (Signed)
Gave dtr avs report and appointments for January. Due to staff meeting date/time requested for 1/9 lab/fu per 12/19 los not available.

## 2016-06-01 NOTE — Progress Notes (Signed)
Nutrition Assessment   Reason for Assessment:   Patient identified on Malnutrition Screening Tool  ASSESSMENT:  55 year old female with lymphoma of head, face and neck. Past medical history of HTN, dysphagia, GERD  Patient reports appetite typically down for about 3 days following chemotherapy secondary to nausea then it gets back to normal.  No report of dysphagia.  Patient reports some problems with constipation.     Medications: ambien, lexpro, nexium, remeron, magic mouthwash, zofran, compazine  Labs: reviewed  Anthropometrics:   Height: 61 inches Weight: 143 lb  BMI: 27  Reports weight has been stable.   NUTRITION DIAGNOSIS: Inadequate oral intake related to cancer related treatments as evidenced by decreased intake for 3 days post chemo treatment    INTERVENTION:   Discussed nutrition strategies to help with nausea.  Handout provided.   Also discussed nutrition related strategies to relieve constipation.  Handout provided and patient verbalized understanding.      MONITORING, EVALUATION, GOAL: Patient will continue to consume adequate calories and protein to maintain weight during treatment   NEXT VISIT: as needed  Veto Macqueen B. Zenia Resides, [place], [place] (pager)

## 2016-06-01 NOTE — Assessment & Plan Note (Signed)
She tolerated treatment well apart from expected side effects such as nausea, constipation and bone pain from G-CSF I will proceed with cycle 2 without dose adjustment. She will complete her course of allopurinol. Due to allergic reaction to steroids, she does not get additional prednisone.

## 2016-06-02 DIAGNOSIS — M898X9 Other specified disorders of bone, unspecified site: Secondary | ICD-10-CM | POA: Insufficient documentation

## 2016-06-02 NOTE — Progress Notes (Signed)
[facility] OFFICE PROGRESS NOTE  Patient Care Team: Velna Hatchet, MD as PCP - General (Internal Medicine) Leta Baptist, MD as Consulting Physician (Otolaryngology)  SUMMARY OF ONCOLOGIC HISTORY: Oncology History   FLIPI score 0 (low risk)     Follicular lymphoma grade iiia, lymph nodes of head, face, and neck ([place])   03/08/2016 Imaging    Ct neck showed right level IIa lymphadenopathy at site of palpable interest. Additionally, there are prominent right upper cervical and left supraclavicular lymph nodes. This may represent nodal metastasis or be related to a systemic inflammatory or lymphoproliferative process. No infectious/and process in the neck identified. Effacement of the right vallecula an asymmetric thickening of the right aryepiglottic fold, direct visualization is recommended to evaluate for mucosal lesion.      03/19/2016 Pathology Results    Accession: [id number] Histologic type: Follicular lymphoma, large cell type. Grade (if applicable): High grade, 3 A. Flow cytometry: B cell population with kappa light chain excess ([id number]) Immunohistochemical stains: CD20, CD3, CD10, BCL-2, BCL-6 and Ki-67 with appropriate controls. Touch preps/imprints: A mixture of small and large lymphoid cells. Comments: The sections show effacement of the lymph nodal architecture by numerous variably sized and ill defined atypical lymphoid follicles characterized by lack of polarity, attenuated or absent mantle zones and relatively homogeneous composition of small and large transformed and cleaved lymphoid cells. The number of large lymphoid cells varies in different follicles but generally average more than 15 cells / high power field . This is associated with scattered mitosis. No diffuse component is identified. To further evaluate this process, flow cytometric analysis was performed and shows a B cell population displaying expression of pan B cell antigens including CD20 associated  with CD10 and kappa light chain excess. Immunohistochemical stains were performed and show that the lymphoid follicles are positive for CD20, CD10, BCL-6 and BCL-2. Ki 67 shows variable increased expression ranging for 10% to >50% in many of the follicles. The atypical lymphoid follicles are surrounded by an abundant population of T cells as primarily seen with CD3. 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 (BNS:kh 03-23-16)      03/19/2016 Procedure    Direct laryngoscopy was negative for abnormalities. Biopsies are taken from right neck LN      04/12/2016 PET scan    PET scan showed abnormal right station 2A lymph node measuring 1.1 cm in length with maximum SUV of 6.1, Deauville scale 4. There is some symmetric and probably physiologic activity along the lingual tonsils. Spleen normal, and no other adenopathy identified      05/11/2016 -  Chemotherapy    She received R-CHOP chemo       INTERVAL HISTORY: Please see below for problem oriented charting. She returns with her husband for cycle 2 of treatment. With cycle 1, she had various side effects including bone pain, some mild nausea and fatigue. She had changes of bowel habits, but stable at present time. No new lymphadenopathy. Denies recent infection. She is attempting to quit smoking.   REVIEW OF SYSTEMS:   Constitutional: Denies fevers, chills or abnormal weight loss Eyes: Denies blurriness of vision Ears, nose, mouth, throat, and face: Denies mucositis or sore throat Respiratory: Denies cough, dyspnea or wheezes Cardiovascular: Denies palpitation, chest discomfort or lower extremity swelling Gastrointestinal:  Denies nausea, heartburn or change in bowel habits Skin: Denies abnormal skin rashes Lymphatics: Denies new lymphadenopathy or easy bruising Neurological:Denies numbness, tingling or new weaknesses Behavioral/Psych: Mood is stable, no new  changes  All other systems were reviewed with the  patient and are negative.  I have reviewed the past medical history, past surgical history, social history and family history with the patient and they are unchanged from previous note.  ALLERGIES:  is allergic to lisinopril; penicillins; and prednisone.  MEDICATIONS:  Current Outpatient Prescriptions  Medication Sig Dispense Refill  . allopurinol (ZYLOPRIM) 300 MG tablet Take 1 tablet (300 mg total) by mouth daily. 30 tablet 3  . escitalopram (LEXAPRO) 10 MG tablet Take 10 mg by mouth daily.   2  . esomeprazole (NEXIUM) 40 MG capsule Take 1 capsule (40 mg total) by mouth daily. 90 capsule 3  . magic mouthwash w/lidocaine SOLN Take 5 mLs by mouth 4 (four) times daily.    . mirtazapine (REMERON) 15 MG tablet Take 1 tablet (15 mg total) by mouth at bedtime. Stop taking Ambien 30 tablet 0  . nicotine (NICODERM CQ - DOSED IN MG/24 HOURS) 14 mg/24hr patch Place 1 patch (14 mg total) onto the skin daily. 21 patch 0  . ondansetron (ZOFRAN) 8 MG tablet Take 1 tablet (8 mg total) by mouth every 8 (eight) hours as needed for refractory nausea / vomiting. 30 tablet 1  . prochlorperazine (COMPAZINE) 10 MG tablet Take 1 tablet (10 mg total) by mouth every 6 (six) hours as needed (Nausea or vomiting). 30 tablet 6  . valsartan-hydrochlorothiazide (DIOVAN-HCT) 160-25 MG tablet Take 1 tablet by mouth daily.   4  . butalbital-acetaminophen-caffeine (FIORICET, ESGIC) 50-325-40 MG tablet Take 1-2 tablets by mouth every 6 (six) hours as needed for headache. (Patient not taking: Reported on 06/01/2016) 30 tablet 1  . hydrochlorothiazide (HYDRODIURIL) 12.5 MG tablet Take 12.5 mg by mouth daily.    . ondansetron (ZOFRAN) 4 MG tablet Take 1 tablet (4 mg total) by mouth every 6 (six) hours as needed for nausea or vomiting. (Patient not taking: Reported on 06/01/2016) 20 tablet 1  . oxyCODONE (OXY IR/ROXICODONE) 5 MG immediate release tablet Take 1 tablet (5 mg total) by mouth every 4 (four) hours as needed for severe  pain. 30 tablet 0  . zolpidem (AMBIEN) 5 MG tablet Take 5 mg by mouth at bedtime as needed.   2   No current facility-administered medications for this visit.     PHYSICAL EXAMINATION: ECOG PERFORMANCE STATUS: 1 - Symptomatic but completely ambulatory  Vitals:   06/01/16 0954  BP: (!) 123/52  Pulse: 91  Resp: 18  Temp: 98.1 F (36.7 C)   Filed Weights   06/01/16 0954  Weight: 145 lb 9.6 oz (66 kg)    GENERAL:alert, no distress and comfortable SKIN: skin color, texture, turgor are normal, no rashes or significant lesions EYES: normal, Conjunctiva are pink and non-injected, sclera clear OROPHARYNX:no exudate, no erythema and lips, buccal mucosa, and tongue normal  NECK: supple, thyroid normal size, non-tender, without nodularity LYMPH:  no palpable lymphadenopathy in the cervical, axillary or inguinal LUNGS: clear to auscultation and percussion with normal breathing effort HEART: regular rate & rhythm and no murmurs and no lower extremity edema ABDOMEN:abdomen soft, non-tender and normal bowel sounds Musculoskeletal:no cyanosis of digits and no clubbing  NEURO: alert & oriented x 3 with fluent speech, no focal motor/sensory deficits  LABORATORY DATA:  I have reviewed the data as listed    Component Value Date/Time   NA 138 06/01/2016 0936   K 4.0 06/01/2016 0936   CL 100 01/22/2014 1655   CO2 26 06/01/2016 0936   GLUCOSE 68 (  L) 06/01/2016 0936   BUN 11.5 06/01/2016 0936   CREATININE 0.7 06/01/2016 0936   CALCIUM 9.4 06/01/2016 0936   PROT 6.9 06/01/2016 0936   ALBUMIN 3.4 (L) 06/01/2016 0936   AST 13 06/01/2016 0936   ALT 10 06/01/2016 0936   ALKPHOS 100 06/01/2016 0936   BILITOT 0.34 06/01/2016 0936   GFRNONAA >90 01/22/2014 1655   GFRAA >90 01/22/2014 1655    No results found for: SPEP, UPEP  Lab Results  Component Value Date   WBC 8.6 06/01/2016   NEUTROABS 6.6 (H) 06/01/2016   HGB 12.4 06/01/2016   HCT 36.7 06/01/2016   MCV 91.7 06/01/2016   PLT 331  06/01/2016      Chemistry      Component Value Date/Time   NA 138 06/01/2016 0936   K 4.0 06/01/2016 0936   CL 100 01/22/2014 1655   CO2 26 06/01/2016 0936   BUN 11.5 06/01/2016 0936   CREATININE 0.7 06/01/2016 0936      Component Value Date/Time   CALCIUM 9.4 06/01/2016 0936   ALKPHOS 100 06/01/2016 0936   AST 13 06/01/2016 0936   ALT 10 06/01/2016 0936   BILITOT 0.34 06/01/2016 0936       ASSESSMENT & PLAN:  Follicular lymphoma grade iiia, lymph nodes of head, face, and neck ([facility]) She tolerated treatment well apart from expected side effects such as nausea, constipation and bone pain from G-CSF I will proceed with cycle 2 without dose adjustment. She will complete her course of allopurinol. Due to allergic reaction to steroids, she does not get additional prednisone.  Gastroesophageal reflux disease The patient was found to have reflux disease and ulcers. She will continue proton pump inhibitor as prescribed  Tobacco abuse The patient managed to cut down on cigarette smoking to 3 cigarettes per day. I congratulated her effort. I recommend a stepdown on her prescription nicotine patch and sent a prescription to her pharmacy  Bone pain due to G-CSF She had profound bone pain from G-CSF injection. Oxycodone appears to relieve her pain. I refill her prescription for oxycodone and discussed potential side effects with narcotic use such as nausea and constipation.   No orders of the defined types were placed in this encounter.  All questions were answered. The patient knows to call the clinic with any problems, questions or concerns. No barriers to learning was detected. I spent 25 minutes counseling the patient face to face. The total time spent in the appointment was 30 minutes and more than 50% was on counseling and review of test results     Heath Lark, MD 06/02/2016 8:43 AM

## 2016-06-02 NOTE — Assessment & Plan Note (Signed)
She had profound bone pain from G-CSF injection. Oxycodone appears to relieve her pain. I refill her prescription for oxycodone and discussed potential side effects with narcotic use such as nausea and constipation.

## 2016-06-02 NOTE — Assessment & Plan Note (Signed)
The patient was found to have reflux disease and ulcers. She will continue proton pump inhibitor as prescribed

## 2016-06-02 NOTE — Assessment & Plan Note (Signed)
The patient managed to cut down on cigarette smoking to 3 cigarettes per day. I congratulated her effort. I recommend a stepdown on her prescription nicotine patch and sent a prescription to her pharmacy

## 2016-06-03 ENCOUNTER — Encounter: Payer: Self-pay | Admitting: Pharmacist

## 2016-06-03 ENCOUNTER — Encounter: Payer: Self-pay | Admitting: Nurse Practitioner

## 2016-06-03 DIAGNOSIS — I959 Hypotension, unspecified: Secondary | ICD-10-CM | POA: Insufficient documentation

## 2016-06-03 NOTE — Assessment & Plan Note (Signed)
Patient states that she has a history of hypertension; and takes both Diovan/HCT and regular.  HCTZ tablets for treatment of her blood pressure.  She states that she took her blood pressure medication when she arrived at the [facility] today.  Patient was in the midst of receiving her Rituxan infusion today; it was noted that her blood pressure had decreased down to the lowest of 90/56.  Patient was asymptomatic with this low blood pressure.  Rituxan was held for a short amount of time; and patient was given a 250 mL mL normal saline IV bolus.  Reviewed all with Dr. Alvy Bimler; and she recommended that patient continue with her Rituxan infusion as planned.  Patient was advised to hold her blood pressure medications on the days of her treatment session fusion in the future.  Blood pressure on discharge was 103/61.  Patient was advised to monitor blood pressure at home; and to call/return or go directly to the emergency department for any worsening symptoms whatsoever.

## 2016-06-03 NOTE — Assessment & Plan Note (Signed)
Patient was in the midst of her Rituxan infusion today; when she experienced an episode of hypotension.  See further notes for details.  She is scheduled to return on 06/22/2016 for labs, visit, and her next cycle of Rituxan.

## 2016-06-03 NOTE — Progress Notes (Signed)
SYMPTOM MANAGEMENT CLINIC    Chief Complaint: Hypotension  HPI:  Emily Santiago 55 y.o. female diagnosed with lymphoma.  Currently undergoing Rituxan infusions.   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 Additionally, there are prominent right upper cervical and left supraclavicular lymph nodes. This may represent nodal metastasis or be related to a systemic inflammatory or lymphoproliferative process. No infectious/and process in the neck identified. Effacement of the right vallecula an asymmetric thickening of the right aryepiglottic fold, direct visualization is recommended to evaluate for mucosal lesion.      03/19/2016 Pathology Results    Accession: [id number] Histologic type: Follicular lymphoma, large cell type. Grade (if applicable): High grade, 3 A. Flow cytometry: B cell population with kappa light chain excess ([id number]) Immunohistochemical stains: CD20, CD3, CD10, BCL-2, BCL-6 and Ki-67 with appropriate controls. Touch preps/imprints: A mixture of small and large lymphoid cells. Comments: The sections show effacement of the lymph nodal architecture by numerous variably sized and ill defined atypical lymphoid follicles characterized by lack of polarity, attenuated or absent mantle zones and relatively homogeneous composition of small and large transformed and cleaved lymphoid cells. The number of large lymphoid cells varies in different follicles but generally average more than 15 cells / high power field . This is associated with scattered mitosis. No diffuse component is identified. To further evaluate this process, flow cytometric analysis was performed and shows a B cell population displaying expression of pan B cell antigens including CD20 associated with CD10 and kappa light chain excess.  Immunohistochemical stains were performed and show that the lymphoid follicles are positive for CD20, CD10, BCL-6 and BCL-2. Ki 67 shows variable increased expression ranging for 10% to >50% in many of the follicles. The atypical lymphoid follicles are surrounded by an abundant population of T cells as primarily seen with CD3. 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 (BNS:kh 03-23-16)      03/19/2016 Procedure    Direct laryngoscopy was negative for abnormalities. Biopsies are taken from right neck LN      04/12/2016 PET scan    PET scan showed abnormal right station 2A lymph node measuring 1.1 cm in length with maximum SUV of 6.1, Deauville scale 4. There is some symmetric and probably physiologic activity along the lingual tonsils. Spleen normal, and no other adenopathy identified      05/11/2016 -  Chemotherapy    She received R-CHOP chemo       Review of Systems  All other systems reviewed and are negative.   Past Medical History:  Diagnosis Date  . Anxiety   . Hypertension   . Lymphoma ([place])   . Varicose veins     Past Surgical History:  Procedure Laterality Date  . ABDOMINAL HYSTERECTOMY    . CHOLECYSTECTOMY    . ESOPHAGEAL MANOMETRY N/A 04/28/2016   Procedure: ESOPHAGEAL MANOMETRY (EM);  Surgeon: Mauri Pole, MD;  Location: [facility];  Service: Endoscopy;  Laterality: N/A;  dr. Loletha Carrow  . LYMPH NODE BIOPSY    . 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    is allergic to lisinopril; penicillins; and prednisone.  Allergies as of 06/01/2016      Reactions   Lisinopril Cough   Penicillins Nausea Only   Stomach  upset   Prednisone    "makes me crazy"      Medication List       Accurate as of 06/01/16 11:59 PM. Always use your most recent med list.          allopurinol 300 MG tablet Commonly known as:  ZYLOPRIM Take 1 tablet (300 mg total) by mouth daily.   butalbital-acetaminophen-caffeine 50-325-40 MG tablet Commonly known as:  FIORICET, ESGIC Take 1-2 tablets by mouth every 6 (six) hours as needed for headache.   escitalopram 10 MG tablet Commonly known as:  LEXAPRO Take 10 mg by mouth daily.   esomeprazole 40 MG capsule Commonly known as:  NEXIUM Take 1 capsule (40 mg total) by mouth daily.   hydrochlorothiazide 12.5 MG tablet Commonly known as:  HYDRODIURIL Take 12.5 mg by mouth daily.   magic mouthwash w/lidocaine Soln Take 5 mLs by mouth 4 (four) times daily.   mirtazapine 15 MG tablet Commonly known as:  REMERON Take 1 tablet (15 mg total) by mouth at bedtime. Stop taking Ambien   nicotine 14 mg/24hr patch Commonly known as:  NICODERM CQ - dosed in mg/24 hours Place 1 patch (14 mg total) onto the skin daily.   ondansetron 4 MG tablet Commonly known as:  ZOFRAN Take 1 tablet (4 mg total) by mouth every 6 (six) hours as needed for nausea or vomiting.   ondansetron 8 MG tablet Commonly known as:  ZOFRAN Take 1 tablet (8 mg total) by mouth every 8 (eight) hours as needed for refractory nausea / vomiting.   oxyCODONE 5 MG immediate release tablet Commonly known as:  Oxy IR/ROXICODONE Take 1 tablet (5 mg total) by mouth every 4 (four) hours as needed for severe pain.   prochlorperazine 10 MG tablet Commonly known as:  COMPAZINE Take 1 tablet (10 mg total) by mouth every 6 (six) hours as needed (Nausea or vomiting).   valsartan-hydrochlorothiazide 160-25 MG tablet Commonly known as:  DIOVAN-HCT Take 1 tablet by mouth daily.   zolpidem 5 MG tablet Commonly known as:  AMBIEN Take 5 mg by mouth at bedtime as needed.        PHYSICAL EXAMINATION  Oncology  Vitals 06/01/2016 06/01/2016  Height - -  Weight - -  Weight (lbs) - -  BMI (kg/m2) - -  Temp - -  Pulse 75 79  Resp - -  SpO2 95 93  BSA (m2) - -   BP Readings from Last 2 Encounters:  06/01/16 103/61  06/01/16 (!) 123/52    Physical Exam  Constitutional: She is oriented to person, place, and time and well-developed, well-nourished, and in no distress.  HENT:  Head: Normocephalic and atraumatic.  Eyes: Conjunctivae and EOM are normal. Pupils are equal, round, and reactive to light.  Neck: Normal range of motion.  Pulmonary/Chest: Effort normal. No stridor. No respiratory distress.  Musculoskeletal: Normal range of motion.  Neurological: She is alert and oriented to person, place, and time.  Skin: Skin is warm and dry.  Psychiatric: Affect normal.  Nursing note and vitals reviewed.   LABORATORY DATA:. Appointment on 06/01/2016  Component Date Value Ref Range Status  . WBC 06/01/2016 8.6  3.9 - 10.3 10e3/uL Final  .  NEUT# 06/01/2016 6.6* 1.5 - 6.5 10e3/uL Final  . HGB 06/01/2016 12.4  11.6 - 15.9 g/dL Final  . HCT 06/01/2016 36.7  34.8 - 46.6 % Final  . Platelets 06/01/2016 331  145 - 400 10e3/uL Final  . MCV 06/01/2016 91.7  79.5 - 101.0 fL Final  . MCH 06/01/2016 31.1  25.1 - 34.0 pg Final  . MCHC 06/01/2016 33.9  31.5 - 36.0 g/dL Final  . RBC 06/01/2016 4.01  3.70 - 5.45 10e6/uL Final  . RDW 06/01/2016 13.3  11.2 - 14.5 % Final  . lymph# 06/01/2016 1.1  0.9 - 3.3 10e3/uL Final  . MONO# 06/01/2016 0.8  0.1 - 0.9 10e3/uL Final  . Eosinophils Absolute 06/01/2016 0.0  0.0 - 0.5 10e3/uL Final  . Basophils Absolute 06/01/2016 0.1  0.0 - 0.1 10e3/uL Final  . NEUT% 06/01/2016 76.4  38.4 - 76.8 % Final  . LYMPH% 06/01/2016 12.9* 14.0 - 49.7 % Final  . MONO% 06/01/2016 9.6  0.0 - 14.0 % Final  . EOS% 06/01/2016 0.3  0.0 - 7.0 % Final  . BASO% 06/01/2016 0.8  0.0 - 2.0 % Final  . Sodium 06/01/2016 138  136 - 145 mEq/L Final  . Potassium 06/01/2016 4.0  3.5 - 5.1 mEq/L  Final  . Chloride 06/01/2016 103  98 - 109 mEq/L Final  . CO2 06/01/2016 26  22 - 29 mEq/L Final  . Glucose 06/01/2016 68* 70 - 140 mg/dl Final  . BUN 06/01/2016 11.5  7.0 - 26.0 mg/dL Final  . Creatinine 06/01/2016 0.7  0.6 - 1.1 mg/dL Final  . Total Bilirubin 06/01/2016 0.34  0.20 - 1.20 mg/dL Final  . Alkaline Phosphatase 06/01/2016 100  40 - 150 U/L Final  . AST 06/01/2016 13  5 - 34 U/L Final  . ALT 06/01/2016 10  0 - 55 U/L Final  . Total Protein 06/01/2016 6.9  6.4 - 8.3 g/dL Final  . Albumin 06/01/2016 3.4* 3.5 - 5.0 g/dL Final  . Calcium 06/01/2016 9.4  8.4 - 10.4 mg/dL Final  . Anion Gap 06/01/2016 9  3 - 11 mEq/L Final  . EGFR 06/01/2016 >90  >90 ml/min/1.73 m2 Final    RADIOGRAPHIC STUDIES: No results found.  ASSESSMENT/PLAN:    Hypotension Patient states that she has a history of hypertension; and takes both Diovan/HCT and regular.  HCTZ tablets for treatment of her blood pressure.  She states that she took her blood pressure medication when she arrived at the [facility] today.  Patient was in the midst of receiving her Rituxan infusion today; it was noted that her blood pressure had decreased down to the lowest of 90/56.  Patient was asymptomatic with this low blood pressure.  Rituxan was held for a short amount of time; and patient was given a 250 mL mL normal saline IV bolus.  Reviewed all with Dr. Alvy Bimler; and she recommended that patient continue with her Rituxan infusion as planned.  Patient was advised to hold her blood pressure medications on the days of her treatment session fusion in the future.  Blood pressure on discharge was 103/61.  Patient was advised to monitor blood pressure at home; and to call/return or go directly to the emergency department for any worsening symptoms whatsoever.  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  See further notes for details.  She is scheduled  to return on 06/22/2016 for labs, visit, and her next cycle of Rituxan.   Patient stated understanding of all instructions; and was in agreement with this plan of care. The patient knows to call the clinic with any problems, questions or concerns.   Total time spent with patient was 25 minutes;  with greater than 75 percent of that time spent in face to face counseling regarding patient's symptoms,  and coordination of care and follow up.  Disclaimer:This dictation was prepared with Dragon/digital dictation along with [employer]. Any transcriptional errors that result from this process are unintentional.  Drue Second, NP 06/03/2016

## 2016-06-04 ENCOUNTER — Other Ambulatory Visit: Payer: Self-pay | Admitting: *Deleted

## 2016-06-04 ENCOUNTER — Ambulatory Visit (HOSPITAL_BASED_OUTPATIENT_CLINIC_OR_DEPARTMENT_OTHER): Payer: BLUE CROSS/BLUE SHIELD

## 2016-06-04 ENCOUNTER — Ambulatory Visit (HOSPITAL_BASED_OUTPATIENT_CLINIC_OR_DEPARTMENT_OTHER): Payer: BLUE CROSS/BLUE SHIELD | Admitting: Nurse Practitioner

## 2016-06-04 ENCOUNTER — Telehealth: Payer: Self-pay | Admitting: *Deleted

## 2016-06-04 VITALS — BP 102/51 | HR 77 | Temp 99.0°F | Resp 18 | Ht 61.0 in | Wt 145.0 lb

## 2016-06-04 DIAGNOSIS — K1231 Oral mucositis (ulcerative) due to antineoplastic therapy: Secondary | ICD-10-CM

## 2016-06-04 DIAGNOSIS — I959 Hypotension, unspecified: Secondary | ICD-10-CM

## 2016-06-04 DIAGNOSIS — C8231 Follicular lymphoma grade IIIa, lymph nodes of head, face, and neck: Secondary | ICD-10-CM

## 2016-06-04 DIAGNOSIS — E860 Dehydration: Secondary | ICD-10-CM | POA: Diagnosis not present

## 2016-06-04 LAB — COMPREHENSIVE METABOLIC PANEL
ALT: 10 U/L (ref 0–55)
AST: 13 U/L (ref 5–34)
Albumin: 3.5 g/dL (ref 3.5–5.0)
Alkaline Phosphatase: 122 U/L (ref 40–150)
Anion Gap: 8 mEq/L (ref 3–11)
BUN: 12.4 mg/dL (ref 7.0–26.0)
CO2: 28 mEq/L (ref 22–29)
Calcium: 9.1 mg/dL (ref 8.4–10.4)
Chloride: 99 mEq/L (ref 98–109)
Creatinine: 0.7 mg/dL (ref 0.6–1.1)
EGFR: >90 mL/min/{1.73_m2} (ref >90)
Glucose: 72 mg/dl (ref 70–140)
Potassium: 3.9 mEq/L (ref 3.5–5.1)
Sodium: 136 mEq/L (ref 136–145)
Total Bilirubin: 0.39 mg/dL (ref 0.20–1.20)
Total Protein: 6.6 g/dL (ref 6.4–8.3)

## 2016-06-04 LAB — CBC WITH DIFFERENTIAL/PLATELET
BASO%: 0.2 % (ref 0.0–2.0)
Basophils Absolute: 0.1 10*3/uL (ref 0.0–0.1)
EOS%: 0 % (ref 0.0–7.0)
Eosinophils Absolute: 0 10*3/uL (ref 0.0–0.5)
HCT: 34.8 % (ref 34.8–46.6)
HGB: 12 g/dL (ref 11.6–15.9)
LYMPH%: 3.1 % — ABNORMAL LOW (ref 14.0–49.7)
MCH: 31.7 pg (ref 25.1–34.0)
MCHC: 34.5 g/dL (ref 31.5–36.0)
MCV: 91.8 fL (ref 79.5–101.0)
MONO#: 0.4 10*3/uL (ref 0.1–0.9)
MONO%: 0.9 % (ref 0.0–14.0)
NEUT#: 42.9 10*3/uL — ABNORMAL HIGH (ref 1.5–6.5)
NEUT%: 95.8 % — ABNORMAL HIGH (ref 38.4–76.8)
Platelets: 273 10*3/uL (ref 145–400)
RBC: 3.79 10*6/uL (ref 3.70–5.45)
RDW: 14.3 % (ref 11.2–14.5)
WBC: 44.8 10*3/uL — ABNORMAL HIGH (ref 3.9–10.3)
lymph#: 1.4 10*3/uL (ref 0.9–3.3)

## 2016-06-04 MED ORDER — MAGIC MOUTHWASH W/LIDOCAINE: 5.0000 mL | Freq: Four times a day (QID) | ORAL | 2 refills | Status: DC

## 2016-06-04 MED ORDER — MAGIC MOUTHWASH W/LIDOCAINE: 5.0000 mL | Freq: Four times a day (QID) | ORAL | 1 refills | Status: DC

## 2016-06-04 MED ORDER — SODIUM CHLORIDE 0.9 % IV SOLN
Freq: Once | INTRAVENOUS | Status: AC
  Administered 2016-06-04: 16:00:00 via INTRAVENOUS

## 2016-06-04 NOTE — Patient Instructions (Signed)
Dehydration, Adult Dehydration is a condition in which there is not enough fluid or water in the body. This happens when you lose more fluids than you take in. Important organs, such as the kidneys, brain, and heart, cannot function without a proper amount of fluids. Any loss of fluids from the body can lead to dehydration. Dehydration can range from mild to severe. This condition should be treated right away to prevent it from becoming severe. What are the causes? This condition may be caused by:  Vomiting.  Diarrhea.  Excessive sweating, such as from heat exposure or exercise.  Not drinking enough fluid, especially:  When ill.  While doing activity that requires a lot of energy.  Excessive urination.  Fever.  Infection.  Certain medicines, such as medicines that cause the body to lose excess fluid (diuretics).  Inability to access safe drinking water.  Reduced physical ability to get adequate water and food. What increases the risk? This condition is more likely to develop in people:  Who have a poorly controlled long-term (chronic) illness, such as diabetes, heart disease, or kidney disease.  Who are age 65 or older.  Who are disabled.  Who live in a place with high altitude.  Who play endurance sports. What are the signs or symptoms? Symptoms of mild dehydration may include:   Thirst.  Dry lips.  Slightly dry mouth.  Dry, warm skin.  Dizziness. Symptoms of moderate dehydration may include:   Very dry mouth.  Muscle cramps.  Dark urine. Urine may be the color of tea.  Decreased urine production.  Decreased tear production.  Heartbeat that is irregular or faster than normal (palpitations).  Headache.  Light-headedness, especially when you stand up from a sitting position.  Fainting (syncope). Symptoms of severe dehydration may include:   Changes in skin, such as:  Cold and clammy skin.  Blotchy (mottled) or pale skin.  Skin that does  not quickly return to normal after being lightly pinched and released (poor skin turgor).  Changes in body fluids, such as:  Extreme thirst.  No tear production.  Inability to sweat when body temperature is high, such as in hot weather.  Very little urine production.  Changes in vital signs, such as:  Weak pulse.  Pulse that is more than 100 beats a minute when sitting still.  Rapid breathing.  Low blood pressure.  Other changes, such as:  Sunken eyes.  Cold hands and feet.  Confusion.  Lack of energy (lethargy).  Difficulty waking up from sleep.  Short-term weight loss.  Unconsciousness. How is this diagnosed? This condition is diagnosed based on your symptoms and a physical exam. Blood and urine tests may be done to help confirm the diagnosis. How is this treated? Treatment for this condition depends on the severity. Mild or moderate dehydration can often be treated at home. Treatment should be started right away. Do not wait until dehydration becomes severe. Severe dehydration is an emergency and it needs to be treated in a hospital. Treatment for mild dehydration may include:   Drinking more fluids.  Replacing salts and minerals in your blood (electrolytes) that you may have lost. Treatment for moderate dehydration may include:   Drinking an oral rehydration solution (ORS). This is a drink that helps you replace fluids and electrolytes (rehydrate). It can be found at pharmacies and retail stores. Treatment for severe dehydration may include:   Receiving fluids through an IV tube.  Receiving an electrolyte solution through a feeding tube that is   passed through your nose and into your stomach (nasogastric tube, or NG tube).  Correcting any abnormalities in electrolytes.  Treating the underlying cause of dehydration. Follow these instructions at home:  If directed by your health care provider, drink an ORS:  Make an ORS by following instructions on the  package.  Start by drinking small amounts, about  cup (120 mL) every 5-10 minutes.  Slowly increase how much you drink until you have taken the amount recommended by your health care provider.  Drink enough clear fluid to keep your urine clear or pale yellow. If you were told to drink an ORS, finish the ORS first, then start slowly drinking other clear fluids. Drink fluids such as:  Water. Do not drink only water. Doing that can lead to having too little salt (sodium) in the body (hyponatremia).  Ice chips.  Fruit juice that you have added water to (diluted fruit juice).  Low-calorie sports drinks.  Avoid:  Alcohol.  Drinks that contain a lot of sugar. These include high-calorie sports drinks, fruit juice that is not diluted, and soda.  Caffeine.  Foods that are greasy or contain a lot of fat or sugar.  Take over-the-counter and prescription medicines only as told by your health care provider.  Do not take sodium tablets. This can lead to having too much sodium in the body (hypernatremia).  Eat foods that contain a healthy balance of electrolytes, such as bananas, oranges, potatoes, tomatoes, and spinach.  Keep all follow-up visits as told by your health care provider. This is important. Contact a health care provider if:  You have abdominal pain that:  Gets worse.  Stays in one area (localizes).  You have a rash.  You have a stiff neck.  You are more irritable than usual.  You are sleepier or more difficult to wake up than usual.  You feel weak or dizzy.  You feel very thirsty.  You have urinated only a small amount of very dark urine over 6-8 hours. Get help right away if:  You have symptoms of severe dehydration.  You cannot drink fluids without vomiting.  Your symptoms get worse with treatment.  You have a fever.  You have a severe headache.  You have vomiting or diarrhea that:  Gets worse.  Does not go away.  You have blood or green matter  (bile) in your vomit.  You have blood in your stool. This may cause stool to look black and tarry.  You have not urinated in 6-8 hours.  You faint.  Your heart rate while sitting still is over 100 beats a minute.  You have trouble breathing. This information is not intended to replace advice given to you by your health care provider. Make sure you discuss any questions you have with your health care provider. Document Released: 05/31/2005 Document Revised: 12/26/2015 Document Reviewed: 07/25/2015 Elsevier Interactive Patient Education  2017 [employer]  

## 2016-06-04 NOTE — Telephone Encounter (Signed)
Received call from pt stating feeling weak, and wanted to talk to nurse.  Spoke with pt and was informed that since last chemo 06/01/16.  Stated she is feeling weak, no appetite, has nausea - took antiemetics with relief, eating jello and drinking Ensure.  Reinforced that pt needed to increase water intake as tolerated.  Stated she also was feeling drained, has dizzy spell since coming home post chemo.  Pt also complained of white patches on tongue and all over mouth.  Eating was slightly difficult due to mouth sores.  Instructed pt to come in [facility] for [place] visit with lab.  Pt voiced understanding. Pt's   Phone     [phone].

## 2016-06-04 NOTE — Progress Notes (Signed)
Patient brought from [facility] to infusion room via wheelchair, per Beth RN okay to increase IVFs to 750 mL/hr

## 2016-06-06 ENCOUNTER — Other Ambulatory Visit: Payer: Self-pay

## 2016-06-06 ENCOUNTER — Emergency Department (HOSPITAL_COMMUNITY): Payer: BLUE CROSS/BLUE SHIELD

## 2016-06-06 ENCOUNTER — Encounter (HOSPITAL_COMMUNITY): Payer: Self-pay

## 2016-06-06 ENCOUNTER — Emergency Department (HOSPITAL_COMMUNITY)
Admission: EM | Admit: 2016-06-06 | Discharge: 2016-06-07 | Disposition: A | Discharge status: Home / Self Care | Payer: BLUE CROSS/BLUE SHIELD | Attending: Emergency Medicine | Admitting: Emergency Medicine

## 2016-06-06 DIAGNOSIS — I10 Essential (primary) hypertension: Secondary | ICD-10-CM | POA: Insufficient documentation

## 2016-06-06 DIAGNOSIS — C8231 Follicular lymphoma grade IIIa, lymph nodes of head, face, and neck: Secondary | ICD-10-CM

## 2016-06-06 DIAGNOSIS — F17210 Nicotine dependence, cigarettes, uncomplicated: Secondary | ICD-10-CM | POA: Diagnosis not present

## 2016-06-06 DIAGNOSIS — R0602 Shortness of breath: Secondary | ICD-10-CM | POA: Diagnosis not present

## 2016-06-06 DIAGNOSIS — R0789 Other chest pain: Secondary | ICD-10-CM | POA: Diagnosis not present

## 2016-06-06 DIAGNOSIS — R079 Chest pain, unspecified: Secondary | ICD-10-CM | POA: Diagnosis not present

## 2016-06-06 MED ORDER — ASPIRIN 81 MG PO CHEW
324.0000 mg | CHEWABLE_TABLET | Freq: Once | ORAL | Status: AC
  Administered 2016-06-06: 324 mg via ORAL
  Filled 2016-06-06: qty 4

## 2016-06-06 MED ORDER — KETOROLAC TROMETHAMINE 30 MG/ML IJ SOLN
15.0000 mg | Freq: Once | INTRAMUSCULAR | Status: AC
  Administered 2016-06-07: 15 mg via INTRAVENOUS
  Filled 2016-06-06: qty 1

## 2016-06-06 NOTE — ED Triage Notes (Signed)
Pt being actively treated for follicular lymphoma. Last got chemo 12/19. Pt arrives c/o L sided chest pain that radiates in to her L arm and back. Endorses SOB with the sharp pains. Also endorsing lower back pain and general malaise since last chemo treatment. A&Ox4. Ambulatory.

## 2016-06-06 NOTE — ED Provider Notes (Signed)
[facility] DEPT Provider Note   CSN: [id number] Arrival date & time: 06/06/16  2313   By signing my name below, I, Emily Santiago, attest that this documentation has been prepared under the direction and in the presence of [name], MD. Electronically signed, Emily Santiago, ED Scribe. 06/06/16. 11:35 PM.   History   Chief Complaint Chief Complaint  Patient presents with  . Chest Pain  . Back Pain   The history is provided by the patient, medical records and the spouse. No language interpreter was used.    HPI Comments: Emily Santiago is a 55 y.o. female with Hx of follicular lymphoma who presents to the Emergency Department complaining of persistent, moderate to severe left sided 9/10 chest pain. She states that her pain is sharp, radiates into her left arm and back, and the pain is worsened with movement laying down and deep breathing.  She states she took blood pressure medications a few days ago, and in the time since she noted severely low blood pressure and fatigue, so she has stopped taking her blood pressure medications. Pt notes she had chemotherapy 06/01/2016, and she notes low blood pressure and gradual onset of current symptoms since her last chemotherapy treatment. Pt reports associated hand swelling, low back pain,  appetite change, fatigue, SOB, chills, diaphoresis and cough. Spouse notes she has taken prescribed 2.5 mg of hydrocodone hourly at home for pain without relief. Spouse reports she is out of xilopram.   Past Medical History:  Diagnosis Date  . Anxiety   . Hypertension   . Lymphoma ([place])   . Varicose veins     Patient Active Problem List   Diagnosis Date Noted  . Hypotension 06/03/2016  . Bone pain due to G-CSF 06/02/2016  . Sore throat 05/04/2016  . Dysphagia 04/22/2016  . Gastroesophageal reflux disease 04/22/2016  . 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 Incisional pain 04/21/2016  .  Hypertension 04/21/2016  . Tobacco abuse 04/20/2016  . Pain of left lower extremity-Thigh / Leg 02/05/2014  . Varicose veins of lower extremities with other complications 54/65/0354    Past Surgical History:  Procedure Laterality Date  . ABDOMINAL HYSTERECTOMY    . CHOLECYSTECTOMY    . ESOPHAGEAL MANOMETRY N/A 04/28/2016   Procedure: ESOPHAGEAL MANOMETRY (EM);  Surgeon: Mauri Pole, MD;  Location: [facility] ENDOSCOPY;  Service: Endoscopy;  Laterality: N/A;  dr. Loletha Carrow  . LYMPH NODE BIOPSY    . WRIST SURGERY Right     OB History    No data available       Home Medications    Prior to Admission medications   Medication Sig Start Date End Date Taking? Authorizing Provider  allopurinol (ZYLOPRIM) 300 MG tablet Take 1 tablet (300 mg total) by mouth daily. 05/04/16   Heath Lark, MD  butalbital-acetaminophen-caffeine (FIORICET, ESGIC) 50-325-40 MG tablet Take 1-2 tablets by mouth every 6 (six) hours as needed for headache. Patient not taking: Reported on 06/04/2016 05/17/16 05/17/17  Heath Lark, MD  escitalopram (LEXAPRO) 10 MG tablet Take 10 mg by mouth daily.  02/25/16   Historical Provider, MD  esomeprazole (NEXIUM) 40 MG capsule Take 1 capsule (40 mg total) by mouth daily. 05/17/16   Jessica D Zehr, PA-C  hydrochlorothiazide (HYDRODIURIL) 12.5 MG tablet Take 12.5 mg by mouth daily.    Historical Provider, MD  magic mouthwash w/lidocaine SOLN Take 5 mLs by mouth 4 (four) times daily. 06/04/16   Derek Jack  Berniece Salines, NP  mirtazapine (REMERON) 15 MG tablet Take 1 tablet (15 mg total) by mouth at bedtime. Stop taking Ambien 05/21/16   Heath Lark, MD  nicotine (NICODERM CQ - DOSED IN MG/24 HOURS) 14 mg/24hr patch Place 1 patch (14 mg total) onto the skin daily. 06/01/16   Heath Lark, MD  ondansetron (ZOFRAN) 4 MG tablet Take 1 tablet (4 mg total) by mouth every 6 (six) hours as needed for nausea or vomiting. Patient not taking: Reported on 06/04/2016 04/26/16   [name], MD  ondansetron  (ZOFRAN) 8 MG tablet Take 1 tablet (8 mg total) by mouth every 8 (eight) hours as needed for refractory nausea / vomiting. 05/04/16   Heath Lark, MD  oxyCODONE (OXY IR/ROXICODONE) 5 MG immediate release tablet Take 1 tablet (5 mg total) by mouth every 4 (four) hours as needed for severe pain. 06/01/16   Heath Lark, MD  prochlorperazine (COMPAZINE) 10 MG tablet Take 1 tablet (10 mg total) by mouth every 6 (six) hours as needed (Nausea or vomiting). 05/04/16   Heath Lark, MD  valsartan-hydrochlorothiazide (DIOVAN-HCT) 160-25 MG tablet Take 1 tablet by mouth daily.  02/25/16   Historical Provider, MD  zolpidem (AMBIEN) 5 MG tablet Take 5 mg by mouth at bedtime as needed.  03/02/16   Historical Provider, MD    Family History Family History  Problem Relation Age of Onset  . Heart disease Father   . Colon cancer Father     dx in his 70's  . Heart disease Maternal Grandmother   . Prostate cancer Maternal Uncle   . Brain cancer Brother   . Liver cancer Brother   . Bone cancer Brother   . Prostate cancer Brother     Social History Social History  Substance Use Topics  . Smoking status: Current Every Day Smoker    Packs/day: 0.50    Years: 35.00    Types: Cigarettes  . Smokeless tobacco: Never Used     Comment: form given 04-22-16  . Alcohol use No     Allergies   Lisinopril; Penicillins; and Prednisone   Review of Systems Review of Systems  Constitutional: Positive for appetite change, chills, diaphoresis and fatigue.  Respiratory: Positive for cough and shortness of breath.   Cardiovascular: Positive for chest pain.  Musculoskeletal: Positive for back pain.  All other systems reviewed and are negative.    Physical Exam Updated Vital Signs BP 124/73 (BP Location: Right Arm)   Pulse 80   Temp 98.4 F (36.9 C) (Oral)   Resp 18   Ht '5\' 1"'$  (1.549 m)   Wt 145 lb (65.8 kg)   SpO2 94%   BMI 27.40 kg/m   Physical Exam  Constitutional: She is oriented to person, place, and  time. She appears well-developed and well-nourished.  HENT:  Head: Normocephalic and atraumatic.  Eyes: EOM are normal. Pupils are equal, round, and reactive to light.  Neck: Normal range of motion. Neck supple. No JVD present.  Cardiovascular: Normal rate, regular rhythm and normal heart sounds.   No murmur heard. Pulmonary/Chest: Breath sounds normal. She has no wheezes. She has no rales. She exhibits tenderness.  Tenderness across anterior chest wall.  Abdominal: Soft. Bowel sounds are normal. She exhibits no distension and no mass. There is no tenderness.  Musculoskeletal: Normal range of motion. She exhibits no edema.  Lymphadenopathy:    She has no cervical adenopathy.  Neurological: She is alert and oriented to person, place, and time. No  cranial nerve deficit. She exhibits normal muscle tone. Coordination normal.  Skin: Skin is warm and dry. No rash noted.  Psychiatric: She has a normal mood and affect. Her behavior is normal. Judgment and thought content normal.  Nursing note and vitals reviewed.    ED Treatments / Results  DIAGNOSTIC STUDIES: Oxygen Saturation is 94% on RA, adequate by my interpretation.    COORDINATION OF CARE: 11:46 PM Discussed treatment plan with pt at bedside and pt agreed to plan.  Labs (all labs ordered are listed, but only abnormal results are displayed) Labs Reviewed  CBC WITH DIFFERENTIAL/PLATELET - Abnormal; Notable for the following:       Result Value   WBC 3.6 (*)    RBC 3.56 (*)    Hemoglobin 11.3 (*)    HCT 32.5 (*)    Monocytes Absolute 0.0 (*)    All other components within normal limits  BASIC METABOLIC PANEL  TROPONIN I  D-DIMER, QUANTITATIVE (NOT AT [facility])    EKG  EKG Interpretation  Date/Time:  Sunday June 06 2016 23:17:29 EST Ventricular Rate:  82 PR Interval:    QRS Duration: 86 QT Interval:  459 QTC Calculation: 537 R Axis:   75 Text Interpretation:  Sinus rhythm Left ventricular hypertrophy Prolonged QT  interval Nonspecific ST and T wave abnormality When compared with ECG of 12/18/2009, QT has lengthened Nonspecific T wave abnormality is now Present Confirmed by Roxanne Mins  MD, Raekwon Winkowski ([id number]) on 06/06/2016 11:22:51 PM       Radiology Dg Chest 2 View  Result Date: 06/06/2016 CLINICAL DATA:  All over chest pain. Patient is being treated for follicular lymphoma with chemotherapy. Shortness of breath. EXAM: CHEST  2 VIEW COMPARISON:  None. FINDINGS: The heart size and mediastinal contours are within normal limits. Both lungs are clear. Chronic anterior compression deformity of T12 vertebral body. IMPRESSION: No active cardiopulmonary disease. Electronically Signed   By: Fidela Salisbury M.D.   On: 06/06/2016 23:57    Procedures Procedures (including critical care time)  Medications Ordered in ED Medications  ketorolac (TORADOL) 30 MG/ML injection 15 mg (15 mg Intravenous Given 06/07/16 0007)  aspirin chewable tablet 324 mg (324 mg Oral Given 06/06/16 2356)     Initial Impression / Assessment and Plan / ED Course  I have reviewed the triage vital signs and the nursing notes.  Pertinent labs & imaging results that were available during my care of the patient were reviewed by me and considered in my medical decision making (see chart for details).  Clinical Course    Chest pain with marked tenderness of the chest wall consistent with costochondritis. However, patient is being treated for lymphoma and is at risk for pulmonary embolism. Chest x-rays obtained and shows no evidence of pneumonia. ECG is unremarkable and troponin is normal. D-dimer is normal..She is given a dose of ketorolac with excellent relief of pain. Unfortunately, she cannot take NSAIDs because of a history of ulcers. Will try her on celecoxib. Refill is given for her prescription for allopurinol. Old records are reviewed confirming she is currently getting chemotherapy for lymphoma.  Final Clinical Impressions(s) / ED Diagnoses     Final diagnoses:  Chest wall pain    New Prescriptions New Prescriptions   CELECOXIB (CELEBREX) 100 MG CAPSULE    Take 1 capsule (100 mg total) by mouth 2 (two) times daily.   I personally performed the services described in this documentation, which was scribed in my presence. The recorded information has been  reviewed and is accurate.       [name], MD [id number]

## 2016-06-07 LAB — TROPONIN I: Troponin I: <0.03 ng/mL (ref <0.03)

## 2016-06-07 LAB — CBC WITH DIFFERENTIAL/PLATELET
Basophils Absolute: 0 10*3/uL (ref 0.0–0.1)
Basophils Relative: 0 %
Eosinophils Absolute: 0 10*3/uL (ref 0.0–0.7)
Eosinophils Relative: 1 %
HCT: 32.5 % — ABNORMAL LOW (ref 36.0–46.0)
Hemoglobin: 11.3 g/dL — ABNORMAL LOW (ref 12.0–15.0)
Lymphocytes Relative: 24 %
Lymphs Abs: 0.9 10*3/uL (ref 0.7–4.0)
MCH: 31.7 pg (ref 26.0–34.0)
MCHC: 34.8 g/dL (ref 30.0–36.0)
MCV: 91.3 fL (ref 78.0–100.0)
Monocytes Absolute: 0 10*3/uL — ABNORMAL LOW (ref 0.1–1.0)
Monocytes Relative: 1 %
Neutro Abs: 2.7 10*3/uL (ref 1.7–7.7)
Neutrophils Relative %: 74 %
Platelets: 199 10*3/uL (ref 150–400)
RBC: 3.56 MIL/uL — ABNORMAL LOW (ref 3.87–5.11)
RDW: 14.2 % (ref 11.5–15.5)
WBC: 3.6 10*3/uL — ABNORMAL LOW (ref 4.0–10.5)

## 2016-06-07 LAB — BASIC METABOLIC PANEL
Anion gap: 7 (ref 5–15)
BUN: 13 mg/dL (ref 6–20)
CO2: 27 mmol/L (ref 22–32)
Calcium: 8.9 mg/dL (ref 8.9–10.3)
Chloride: 102 mmol/L (ref 101–111)
Creatinine, Ser: 0.47 mg/dL (ref 0.44–1.00)
GFR calc Af Amer: >60 mL/min (ref >60)
GFR calc non Af Amer: >60 mL/min (ref >60)
Glucose, Bld: 90 mg/dL (ref 65–99)
Potassium: 3.9 mmol/L (ref 3.5–5.1)
Sodium: 136 mmol/L (ref 135–145)

## 2016-06-07 LAB — D-DIMER, QUANTITATIVE: D-Dimer, Quant: 0.38 ug/mL-FEU (ref 0.00–0.50)

## 2016-06-07 MED ORDER — ALLOPURINOL 300 MG PO TABS: 300.0000 mg | ORAL_TABLET | Freq: Every day | ORAL | 0 refills | Status: DC

## 2016-06-07 MED ORDER — CELECOXIB 100 MG PO CAPS: 100.0000 mg | ORAL_CAPSULE | Freq: Two times a day (BID) | ORAL | 0 refills | Status: DC

## 2016-06-08 ENCOUNTER — Encounter: Payer: Self-pay | Admitting: Nurse Practitioner

## 2016-06-08 DIAGNOSIS — K1231 Oral mucositis (ulcerative) due to antineoplastic therapy: Secondary | ICD-10-CM | POA: Insufficient documentation

## 2016-06-08 DIAGNOSIS — E860 Dehydration: Secondary | ICD-10-CM | POA: Insufficient documentation

## 2016-06-08 MED ORDER — VALSARTAN-HYDROCHLOROTHIAZIDE 160-25 MG PO TABS: ORAL_TABLET | ORAL | 4 refills | Status: DC

## 2016-06-08 NOTE — Progress Notes (Signed)
SYMPTOM MANAGEMENT CLINIC    Chief Complaint: Mucositis, dehydration  HPI:  Emily Santiago 55 y.o. female diagnosed with lymphoma.  Currently undergoing R-CHOP chemotherapy.   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 Additionally, there are prominent right upper cervical and left supraclavicular lymph nodes. This may represent nodal metastasis or be related to a systemic inflammatory or lymphoproliferative process. No infectious/and process in the neck identified. Effacement of the right vallecula an asymmetric thickening of the right aryepiglottic fold, direct visualization is recommended to evaluate for mucosal lesion.      03/19/2016 Pathology Results    Accession: [id number] Histologic type: Follicular lymphoma, large cell type. Grade (if applicable): High grade, 3 A. Flow cytometry: B cell population with kappa light chain excess ([id number]) Immunohistochemical stains: CD20, CD3, CD10, BCL-2, BCL-6 and Ki-67 with appropriate controls. Touch preps/imprints: A mixture of small and large lymphoid cells. Comments: The sections show effacement of the lymph nodal architecture by numerous variably sized and ill defined atypical lymphoid follicles characterized by lack of polarity, attenuated or absent mantle zones and relatively homogeneous composition of small and large transformed and cleaved lymphoid cells. The number of large lymphoid cells varies in different follicles but generally average more than 15 cells / high power field . This is associated with scattered mitosis. No diffuse component is identified. To further evaluate this process, flow cytometric analysis was performed and shows a B cell population displaying expression of pan B cell antigens including CD20 associated with CD10 and kappa light  chain excess. Immunohistochemical stains were performed and show that the lymphoid follicles are positive for CD20, CD10, BCL-6 and BCL-2. Ki 67 shows variable increased expression ranging for 10% to >50% in many of the follicles. The atypical lymphoid follicles are surrounded by an abundant population of T cells as primarily seen with CD3. 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 (BNS:kh 03-23-16)      03/19/2016 Procedure    Direct laryngoscopy was negative for abnormalities. Biopsies are taken from right neck LN      04/12/2016 PET scan    PET scan showed abnormal right station 2A lymph node measuring 1.1 cm in length with maximum SUV of 6.1, Deauville scale 4. There is some symmetric and probably physiologic activity along the lingual tonsils. Spleen normal, and no other adenopathy identified      05/11/2016 -  Chemotherapy    She received R-CHOP chemo       Review of Systems  Constitutional: Positive for malaise/fatigue and weight loss.  HENT: Positive for sore throat.   All other systems reviewed and are negative.   Past Medical History:  Diagnosis Date  . Anxiety   . Hypertension   . Lymphoma ([place])   . Varicose veins     Past Surgical History:  Procedure Laterality Date  . ABDOMINAL HYSTERECTOMY    . CHOLECYSTECTOMY    . ESOPHAGEAL MANOMETRY N/A 04/28/2016   Procedure: ESOPHAGEAL MANOMETRY (EM);  Surgeon: Mauri Pole, MD;  Location: [facility];  Service: Endoscopy;  Laterality: N/A;  dr. Loletha Carrow  . LYMPH NODE BIOPSY    . WRIST SURGERY Right     has Varicose veins of lower extremities with other complications; Pain of left lower extremity-Thigh / Leg; Tobacco abuse; Follicular lymphoma  grade iiia, lymph nodes of head, face, and neck ([place]); Incisional pain; Hypertension; Dysphagia; Gastroesophageal reflux disease; Sore throat; Bone pain due to G-CSF; Hypotension; Dehydration; and Mucositis due to antineoplastic therapy on her problem list.      is allergic to lisinopril; penicillins; and prednisone.  Allergies as of 06/04/2016      Reactions   Lisinopril Cough   Penicillins Nausea Only   Stomach upset   Prednisone    "makes me crazy"      Medication List       Accurate as of 06/04/16 11:59 PM. Always use your most recent med list.          allopurinol 300 MG tablet Commonly known as:  ZYLOPRIM Take 1 tablet (300 mg total) by mouth daily.   butalbital-acetaminophen-caffeine 50-325-40 MG tablet Commonly known as:  FIORICET, ESGIC Take 1-2 tablets by mouth every 6 (six) hours as needed for headache.   escitalopram 10 MG tablet Commonly known as:  LEXAPRO Take 10 mg by mouth daily.   esomeprazole 40 MG capsule Commonly known as:  NEXIUM Take 1 capsule (40 mg total) by mouth daily.   hydrochlorothiazide 12.5 MG tablet Commonly known as:  HYDRODIURIL Take 12.5 mg by mouth daily.   magic mouthwash w/lidocaine Soln Take 5 mLs by mouth 4 (four) times daily.   mirtazapine 15 MG tablet Commonly known as:  REMERON Take 1 tablet (15 mg total) by mouth at bedtime. Stop taking Ambien   nicotine 14 mg/24hr patch Commonly known as:  NICODERM CQ - dosed in mg/24 hours Place 1 patch (14 mg total) onto the skin daily.   ondansetron 4 MG tablet Commonly known as:  ZOFRAN Take 1 tablet (4 mg total) by mouth every 6 (six) hours as needed for nausea or vomiting.   ondansetron 8 MG tablet Commonly known as:  ZOFRAN Take 1 tablet (8 mg total) by mouth every 8 (eight) hours as needed for refractory nausea / vomiting.   oxyCODONE 5 MG immediate release tablet Commonly known as:  Oxy IR/ROXICODONE Take 1 tablet (5 mg total) by mouth every 4 (four) hours as needed for severe pain.   prochlorperazine 10 MG tablet Commonly known as:  COMPAZINE Take 1 tablet (10 mg total) by mouth every 6 (six) hours as needed (Nausea or vomiting).   valsartan-hydrochlorothiazide 160-25 MG tablet Commonly known as:  DIOVAN-HCT Take  1/2 tab PO Q AM.   zolpidem 5 MG tablet Commonly known as:  AMBIEN Take 5 mg by mouth at bedtime as needed.        PHYSICAL EXAMINATION  Oncology Vitals 06/07/2016 06/07/2016  Height - -  Weight - -  Weight (lbs) - -  BMI (kg/m2) - -  Temp - -  Pulse 66 68  Resp 18 15  SpO2 99 97  BSA (m2) - -   BP Readings from Last 2 Encounters:  06/07/16 116/68  06/04/16 (!) 109/59    Physical Exam  Constitutional: She is oriented to person, place, and time. She appears malnourished and dehydrated. She appears unhealthy. She appears cachectic.  HENT:  Head: Normocephalic and atraumatic.  Trace thrush to back of tongue.  Eyes: Conjunctivae and EOM are normal. Pupils are equal, round, and reactive to light. Right eye exhibits no discharge. Left eye exhibits no discharge. No scleral icterus.  Neck: Normal range of motion. Neck supple. No JVD present. No tracheal deviation present. No thyromegaly present.  Cardiovascular: Normal rate, regular rhythm, normal heart sounds and intact  distal pulses.   Pulmonary/Chest: Effort normal and breath sounds normal. No respiratory distress. She has no wheezes. She has no rales. She exhibits no tenderness.  Abdominal: Soft. Bowel sounds are normal. She exhibits no distension and no mass. There is no tenderness. There is no rebound and no guarding.  Musculoskeletal: Normal range of motion. She exhibits no edema or tenderness.  Lymphadenopathy:    She has no cervical adenopathy.  Neurological: She is alert and oriented to person, place, and time. Gait normal.  Skin: Skin is warm and dry. No rash noted. No erythema. There is pallor.  Psychiatric: Affect normal.  Nursing note and vitals reviewed.   LABORATORY DATA:. Appointment on 06/04/2016  Component Date Value Ref Range Status  . WBC 06/04/2016 44.8* 3.9 - 10.3 10e3/uL Final  . NEUT# 06/04/2016 42.9* 1.5 - 6.5 10e3/uL Final  . HGB 06/04/2016 12.0  11.6 - 15.9 g/dL Final  . HCT 06/04/2016 34.8   34.8 - 46.6 % Final  . Platelets 06/04/2016 273  145 - 400 10e3/uL Final  . MCV 06/04/2016 91.8  79.5 - 101.0 fL Final  . MCH 06/04/2016 31.7  25.1 - 34.0 pg Final  . MCHC 06/04/2016 34.5  31.5 - 36.0 g/dL Final  . RBC 06/04/2016 3.79  3.70 - 5.45 10e6/uL Final  . RDW 06/04/2016 14.3  11.2 - 14.5 % Final  . lymph# 06/04/2016 1.4  0.9 - 3.3 10e3/uL Final  . MONO# 06/04/2016 0.4  0.1 - 0.9 10e3/uL Final  . Eosinophils Absolute 06/04/2016 0.0  0.0 - 0.5 10e3/uL Final  . Basophils Absolute 06/04/2016 0.1  0.0 - 0.1 10e3/uL Final  . NEUT% 06/04/2016 95.8* 38.4 - 76.8 % Final  . LYMPH% 06/04/2016 3.1* 14.0 - 49.7 % Final  . MONO% 06/04/2016 0.9  0.0 - 14.0 % Final  . EOS% 06/04/2016 0.0  0.0 - 7.0 % Final  . BASO% 06/04/2016 0.2  0.0 - 2.0 % Final  . Sodium 06/04/2016 136  136 - 145 mEq/L Final  . Potassium 06/04/2016 3.9  3.5 - 5.1 mEq/L Final  . Chloride 06/04/2016 99  98 - 109 mEq/L Final  . CO2 06/04/2016 28  22 - 29 mEq/L Final  . Glucose 06/04/2016 72  70 - 140 mg/dl Final  . BUN 06/04/2016 12.4  7.0 - 26.0 mg/dL Final  . Creatinine 06/04/2016 0.7  0.6 - 1.1 mg/dL Final  . Total Bilirubin 06/04/2016 0.39  0.20 - 1.20 mg/dL Final  . Alkaline Phosphatase 06/04/2016 122  40 - 150 U/L Final  . AST 06/04/2016 13  5 - 34 U/L Final  . ALT 06/04/2016 10  0 - 55 U/L Final  . Total Protein 06/04/2016 6.6  6.4 - 8.3 g/dL Final  . Albumin 06/04/2016 3.5  3.5 - 5.0 g/dL Final  . Calcium 06/04/2016 9.1  8.4 - 10.4 mg/dL Final  . Anion Gap 06/04/2016 8  3 - 11 mEq/L Final  . EGFR 06/04/2016 >90  >90 ml/min/1.73 m2 Final    RADIOGRAPHIC STUDIES: Dg Chest 2 View  Result Date: 06/06/2016 CLINICAL DATA:  All over chest pain. Patient is being treated for follicular lymphoma with chemotherapy. Shortness of breath. EXAM: CHEST  2 VIEW COMPARISON:  None. FINDINGS: The heart size and mediastinal contours are within normal limits. Both lungs are clear. Chronic anterior compression deformity of T12  vertebral body. IMPRESSION: No active cardiopulmonary disease. Electronically Signed   By: Fidela Salisbury M.D.   On: 06/06/2016 23:57    ASSESSMENT/PLAN:  Mucositis due to antineoplastic therapy Patient is complaining of mucositis secondary to her chemotherapy.  She states she already has Magic mouthwash at home.  Exam today reveals some trace thrush to the back of patient's tongue and some oral sensitivity in general.  She has no obvious oral lesions.  Patient appears mildly dehydrated today and will receive IV fluid rehydration.  Confirmed the patient does have symmetric mouthwash refills as well.  She was encouraged to try baking soda and salt swish and spit as well.  Hypotension Patient has history of hypertension.  She has been suffering with some mucositis; and is subsequently dehydrated.  Her blood pressure on initial check today was 102/51.  She states she has not taken the HCTZ any longer; but still takes the Diovan/HCT.  Patient was given IV fluid rehydration today.  Also, patient was instructed to take only one half of a tablet of the Diovan/HCT daily in the future.  She was advised to check her blood pressure at home and keep a blood pressure log as well.  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  She is scheduled to return on 06/22/2016 for labs, visit, and her next cycle of chemotherapy.  Dehydration Patient has had some chemotherapy-induced mucositis and dehydration.  She received IV fluid rehydration while at the cancer Center today.   Patient stated understanding of all instructions; and was in agreement with this plan of care. The patient knows to call the clinic with any problems, questions or concerns.   Total time spent with patient was 25 minutes;  with greater than 75 percent of that time spent in face to face counseling regarding patient's symptoms,  and  coordination of care and follow up.  Disclaimer:This dictation was prepared with Dragon/digital dictation along with [employer]. Any transcriptional errors that result from this process are unintentional.  Drue Second, NP 06/08/2016

## 2016-06-08 NOTE — Assessment & Plan Note (Signed)
Patient received cycle 2 of her R CHOP chemotherapy on 06/01/2016.  She is scheduled to return on 06/22/2016 for labs, visit, and her next cycle of chemotherapy.

## 2016-06-08 NOTE — Assessment & Plan Note (Signed)
Patient has had some chemotherapy-induced mucositis and dehydration.  She received IV fluid rehydration while at the cancer Center today.

## 2016-06-08 NOTE — Assessment & Plan Note (Signed)
Patient has history of hypertension.  She has been suffering with some mucositis; and is subsequently dehydrated.  Her blood pressure on initial check today was 102/51.  She states she has not taken the HCTZ any longer; but still takes the Diovan/HCT.  Patient was given IV fluid rehydration today.  Also, patient was instructed to take only one half of a tablet of the Diovan/HCT daily in the future.  She was advised to check her blood pressure at home and keep a blood pressure log as well.

## 2016-06-08 NOTE — Assessment & Plan Note (Signed)
Patient is complaining of mucositis secondary to her chemotherapy.  She states she already has Magic mouthwash at home.  Exam today reveals some trace thrush to the back of patient's tongue and some oral sensitivity in general.  She has no obvious oral lesions.  Patient appears mildly dehydrated today and will receive IV fluid rehydration.  Confirmed the patient does have symmetric mouthwash refills as well.  She was encouraged to try baking soda and salt swish and spit as well.

## 2016-06-09 ENCOUNTER — Telehealth: Payer: Self-pay | Admitting: *Deleted

## 2016-06-09 NOTE — Telephone Encounter (Signed)
Notes received from [facility] [place] pt went to ED over the holiday weekend for c/o chest pains.  I called pt to check on her condition today.  Pt states chest pains are better.  She is taking Celebrex twice a day as prescribed by ED MD and she thinks this is helping.  She continues to have "all over" body and bone aches for which she is taking Oxycodone occasionally.  Pt states she is drinking plenty of fluids but she is holding her BP meds since her BP was low last week.  She denies any needs or concerns at this time.  Instructed pt to call our office for any worsening or unrelieved symptoms or concerns.  Confirmed her next appt on 1/9.  Pt verbalized understanding.

## 2016-06-12 ENCOUNTER — Other Ambulatory Visit: Payer: Self-pay | Admitting: Hematology and Oncology

## 2016-06-15 ENCOUNTER — Other Ambulatory Visit: Payer: Self-pay | Admitting: *Deleted

## 2016-06-15 ENCOUNTER — Telehealth: Payer: Self-pay | Admitting: *Deleted

## 2016-06-15 MED ORDER — OXYCODONE HCL 5 MG PO TABS: 5.0000 mg | ORAL_TABLET | ORAL | 0 refills | Status: DC | PRN

## 2016-06-15 MED ORDER — ESOMEPRAZOLE MAGNESIUM 40 MG PO CPDR: 40.0000 mg | DELAYED_RELEASE_CAPSULE | Freq: Every day | ORAL | 3 refills | Status: DC

## 2016-06-15 NOTE — Telephone Encounter (Signed)
Pt requests refill on Nexium sent to [facility].  She also needs refill on Oxycodone 5 mg.  She is taking 1/2 tablet 3 times during the day and then 1 whole tablet at bedtime for generalized aches and pains.  She also taking Celebrex twice daily for chest pains.  Informed pt nurse will call her back when Rx for Oxycodone ready to pick up.

## 2016-06-15 NOTE — Telephone Encounter (Signed)
Please refill Appreciate if you can help me print the scripts Oxycodone for 90 tabs Nexium electronic I do not recommend celebrex while on oxycodone

## 2016-06-17 ENCOUNTER — Telehealth: Payer: Self-pay

## 2016-06-17 ENCOUNTER — Telehealth: Payer: Self-pay | Admitting: *Deleted

## 2016-06-17 MED ORDER — MIRTAZAPINE 15 MG PO TABS: 15.0000 mg | ORAL_TABLET | Freq: Every day | ORAL | 1 refills | Status: DC

## 2016-06-17 NOTE — Telephone Encounter (Signed)
Called pt to clarify Ambien dose.  Asked pt to read her bottle and she read "Mirtazapine 15 mg."  Informed pt this is not Ambien but Dr. Alvy Bimler will refill her Mirtazapine to take at [facility].   Instructed pt to not take any Lorrin Mais if she finds she has some at home.  Only take Mirtazapine as prescribed and new Rx sent to [facility].  She verbalized understanding.

## 2016-06-17 NOTE — Telephone Encounter (Signed)
Pt is asking if Dr Alvy Bimler would refill her Lorrin Mais. Ambien is not currently on her MAR. She states her bottle is 15 mg. Historical med list has an ambien 5 mg. Please send RX to [facility].

## 2016-06-17 NOTE — Telephone Encounter (Signed)
I do not recommend dose greater than 10 mg I can refill 10 mg but to my knowledge it cannot be e-scribed

## 2016-06-22 ENCOUNTER — Telehealth: Payer: Self-pay | Admitting: Hematology and Oncology

## 2016-06-22 ENCOUNTER — Ambulatory Visit (HOSPITAL_BASED_OUTPATIENT_CLINIC_OR_DEPARTMENT_OTHER): Payer: BLUE CROSS/BLUE SHIELD

## 2016-06-22 ENCOUNTER — Ambulatory Visit (HOSPITAL_BASED_OUTPATIENT_CLINIC_OR_DEPARTMENT_OTHER): Payer: BLUE CROSS/BLUE SHIELD | Admitting: Hematology and Oncology

## 2016-06-22 ENCOUNTER — Other Ambulatory Visit (HOSPITAL_BASED_OUTPATIENT_CLINIC_OR_DEPARTMENT_OTHER): Payer: BLUE CROSS/BLUE SHIELD

## 2016-06-22 ENCOUNTER — Encounter: Payer: Self-pay | Admitting: Hematology and Oncology

## 2016-06-22 VITALS — BP 145/79 | HR 77 | Temp 98.3°F | Resp 18 | Ht 61.0 in | Wt 150.5 lb

## 2016-06-22 VITALS — BP 146/79 | HR 76 | Temp 97.9°F | Resp 18

## 2016-06-22 DIAGNOSIS — K219 Gastro-esophageal reflux disease without esophagitis: Secondary | ICD-10-CM

## 2016-06-22 DIAGNOSIS — C8231 Follicular lymphoma grade IIIa, lymph nodes of head, face, and neck: Secondary | ICD-10-CM | POA: Diagnosis not present

## 2016-06-22 DIAGNOSIS — M898X9 Other specified disorders of bone, unspecified site: Secondary | ICD-10-CM | POA: Diagnosis not present

## 2016-06-22 DIAGNOSIS — K5909 Other constipation: Secondary | ICD-10-CM | POA: Diagnosis not present

## 2016-06-22 DIAGNOSIS — Z720 Tobacco use: Secondary | ICD-10-CM

## 2016-06-22 DIAGNOSIS — Z5112 Encounter for antineoplastic immunotherapy: Secondary | ICD-10-CM | POA: Diagnosis not present

## 2016-06-22 DIAGNOSIS — Z5111 Encounter for antineoplastic chemotherapy: Secondary | ICD-10-CM | POA: Diagnosis not present

## 2016-06-22 LAB — COMPREHENSIVE METABOLIC PANEL
ALT: 15 U/L (ref 0–55)
AST: 16 U/L (ref 5–34)
Albumin: 3.5 g/dL (ref 3.5–5.0)
Alkaline Phosphatase: 92 U/L (ref 40–150)
Anion Gap: 10 mEq/L (ref 3–11)
BUN: 15 mg/dL (ref 7.0–26.0)
CO2: 24 mEq/L (ref 22–29)
Calcium: 9.3 mg/dL (ref 8.4–10.4)
Chloride: 107 mEq/L (ref 98–109)
Creatinine: 0.7 mg/dL (ref 0.6–1.1)
EGFR: >90 mL/min/{1.73_m2} (ref >90)
Glucose: 92 mg/dl (ref 70–140)
Potassium: 3.8 mEq/L (ref 3.5–5.1)
Sodium: 141 mEq/L (ref 136–145)
Total Bilirubin: 0.32 mg/dL (ref 0.20–1.20)
Total Protein: 6.4 g/dL (ref 6.4–8.3)

## 2016-06-22 LAB — CBC WITH DIFFERENTIAL/PLATELET
BASO%: 0.2 % (ref 0.0–2.0)
Basophils Absolute: 0 10*3/uL (ref 0.0–0.1)
EOS%: 1.2 % (ref 0.0–7.0)
Eosinophils Absolute: 0.1 10*3/uL (ref 0.0–0.5)
HCT: 35 % (ref 34.8–46.6)
HGB: 11.9 g/dL (ref 11.6–15.9)
LYMPH%: 18.6 % (ref 14.0–49.7)
MCH: 31.8 pg (ref 25.1–34.0)
MCHC: 34 g/dL (ref 31.5–36.0)
MCV: 93.6 fL (ref 79.5–101.0)
MONO#: 0.8 10*3/uL (ref 0.1–0.9)
MONO%: 14.2 % — ABNORMAL HIGH (ref 0.0–14.0)
NEUT#: 3.7 10*3/uL (ref 1.5–6.5)
NEUT%: 65.8 % (ref 38.4–76.8)
Platelets: 235 10*3/uL (ref 145–400)
RBC: 3.74 10*6/uL (ref 3.70–5.45)
RDW: 16.6 % — ABNORMAL HIGH (ref 11.2–14.5)
WBC: 5.7 10*3/uL (ref 3.9–10.3)
lymph#: 1.1 10*3/uL (ref 0.9–3.3)

## 2016-06-22 MED ORDER — ACETAMINOPHEN 325 MG PO TABS
ORAL_TABLET | ORAL | Status: AC
  Filled 2016-06-22: qty 1

## 2016-06-22 MED ORDER — SODIUM CHLORIDE 0.9 % IV SOLN
750.0000 mg/m2 | Freq: Once | INTRAVENOUS | Status: AC
  Administered 2016-06-22: 1260 mg via INTRAVENOUS
  Filled 2016-06-22: qty 63

## 2016-06-22 MED ORDER — DOXORUBICIN HCL CHEMO IV INJECTION 2 MG/ML
50.0000 mg/m2 | Freq: Once | INTRAVENOUS | Status: AC
  Administered 2016-06-22: 84 mg via INTRAVENOUS
  Filled 2016-06-22: qty 42

## 2016-06-22 MED ORDER — ACETAMINOPHEN 325 MG PO TABS
650.0000 mg | ORAL_TABLET | Freq: Once | ORAL | Status: AC
  Administered 2016-06-22: 650 mg via ORAL

## 2016-06-22 MED ORDER — DIPHENHYDRAMINE HCL 25 MG PO CAPS
50.0000 mg | ORAL_CAPSULE | Freq: Once | ORAL | Status: AC
  Administered 2016-06-22: 50 mg via ORAL

## 2016-06-22 MED ORDER — ACETAMINOPHEN 325 MG PO TABS
ORAL_TABLET | ORAL | Status: AC
  Filled 2016-06-22: qty 2

## 2016-06-22 MED ORDER — ALUM & MAG HYDROXIDE-SIMETH 200-200-20 MG/5ML PO SUSP
30.0000 mL | Freq: Once | ORAL | Status: AC
  Administered 2016-06-22: 30 mL via ORAL
  Filled 2016-06-22: qty 30

## 2016-06-22 MED ORDER — SODIUM CHLORIDE 0.9 % IV SOLN
Freq: Once | INTRAVENOUS | Status: AC
  Administered 2016-06-22: 12:00:00 via INTRAVENOUS
  Filled 2016-06-22: qty 5

## 2016-06-22 MED ORDER — DIPHENHYDRAMINE HCL 25 MG PO CAPS
ORAL_CAPSULE | ORAL | Status: AC
  Filled 2016-06-22: qty 2

## 2016-06-22 MED ORDER — RITUXIMAB CHEMO INJECTION 500 MG/50ML
375.0000 mg/m2 | Freq: Once | INTRAVENOUS | Status: AC
  Administered 2016-06-22: 600 mg via INTRAVENOUS
  Filled 2016-06-22: qty 50

## 2016-06-22 MED ORDER — PALONOSETRON HCL INJECTION 0.25 MG/5ML
INTRAVENOUS | Status: AC
  Filled 2016-06-22: qty 5

## 2016-06-22 MED ORDER — SODIUM CHLORIDE 0.9 % IV SOLN
Freq: Once | INTRAVENOUS | Status: AC
  Administered 2016-06-22: 12:00:00 via INTRAVENOUS

## 2016-06-22 MED ORDER — VINCRISTINE SULFATE CHEMO INJECTION 1 MG/ML
2.0000 mg | Freq: Once | INTRAVENOUS | Status: AC
  Administered 2016-06-22: 2 mg via INTRAVENOUS
  Filled 2016-06-22: qty 2

## 2016-06-22 MED ORDER — PALONOSETRON HCL INJECTION 0.25 MG/5ML
0.2500 mg | Freq: Once | INTRAVENOUS | Status: AC
  Administered 2016-06-22: 0.25 mg via INTRAVENOUS

## 2016-06-22 NOTE — Assessment & Plan Note (Signed)
She has significant reflux disease. I recommend Mylanta

## 2016-06-22 NOTE — Assessment & Plan Note (Signed)
She tolerated treatment very poorly with multiple side effects. With Neulasta injection, she had significant pain and nausea to the point she has to be placed on narcotic prescription. The patient wants to proceed with cycle 3. She will proceed with treatment without dose adjustment and I will omit Neulasta We discussed neutropenic precaution. I recommend seeing her back next week for supportive care and she agreed. I plan to repeat imaging study next month to assess response to treatment

## 2016-06-22 NOTE — Patient Instructions (Signed)
[facility] Discharge Instructions for Patients Receiving Chemotherapy  Today you received the following chemotherapy agents: Adriamycin, Vincristine, Cytoxan and Rituxan   To help prevent nausea and vomiting after your treatment, we encourage you to take your nausea medication as directed.    If you develop nausea and vomiting that is not controlled by your nausea medication, call the clinic.   BELOW ARE SYMPTOMS THAT SHOULD BE REPORTED IMMEDIATELY:  *FEVER GREATER THAN 100.5 F  *CHILLS WITH OR WITHOUT FEVER  NAUSEA AND VOMITING THAT IS NOT CONTROLLED WITH YOUR NAUSEA MEDICATION  *UNUSUAL SHORTNESS OF BREATH  *UNUSUAL BRUISING OR BLEEDING  TENDERNESS IN MOUTH AND THROAT WITH OR WITHOUT PRESENCE OF ULCERS  *URINARY PROBLEMS  *BOWEL PROBLEMS  UNUSUAL RASH Items with * indicate a potential emergency and should be followed up as soon as possible.  Feel free to call the clinic you have any questions or concerns. The clinic phone number is [phone].  Please show the CHEMO ALERT CARD at check-in to the Emergency Department and triage nurse.   

## 2016-06-22 NOTE — Progress Notes (Signed)
Pt tolerating Rituxan infusion this time. Denies any complaints or symptoms. Pt vss remains wnl.

## 2016-06-22 NOTE — Progress Notes (Signed)
[facility] OFF[facility] PROGRESS NOTE  Patient Care Team: Velna Hatchet, MD as PCP - General (Internal Medicine) Leta Baptist, MD as Consulting Physician (Otolaryngology)  SUMMARY OF ONCOLOGIC HISTORY: Oncology History   FLIPI score 0 (low risk)     Follicular lymphoma grade iiia, lymph nodes of head, face, and neck ([place])   03/08/2016 Imaging    Ct neck showed right level IIa lymphadenopathy at site of palpable interest. Additionally, there are prominent right upper cervical and left supraclavicular lymph nodes. This may represent nodal metastasis or be related to a systemic inflammatory or lymphoproliferative process. No infectious/and process in the neck identified. Effacement of the right vallecula an asymmetric thickening of the right aryepiglottic fold, direct visualization is recommended to evaluate for mucosal lesion.      03/19/2016 Pathology Results    Accession: [id number] Histologic type: Follicular lymphoma, large cell type. Grade (if applicable): High grade, 3 A. Flow cytometry: B cell population with kappa light chain excess ([id number]) Immunohistochemical stains: CD20, CD3, CD10, BCL-2, BCL-6 and Ki-67 with appropriate controls. Touch preps/imprints: A mixture of small and large lymphoid cells. Comments: The sections show effacement of the lymph nodal architecture by numerous variably sized and ill defined atypical lymphoid follicles characterized by lack of polarity, attenuated or absent mantle zones and relatively homogeneous composition of small and large transformed and cleaved lymphoid cells. The number of large lymphoid cells varies in different follicles but generally average more than 15 cells / high power field . This is associated with scattered mitosis. No diffuse component is identified. To further evaluate this process, flow cytometric analysis was performed and shows a B cell population displaying expression of pan B cell antigens including CD20 associated  with CD10 and kappa light chain excess. Immunohistochemical stains were performed and show that the lymphoid follicles are positive for CD20, CD10, BCL-6 and BCL-2. Ki 67 shows variable increased expression ranging for 10% to >50% in many of the follicles. The atypical lymphoid follicles are surrounded by an abundant population of T cells as primarily seen with CD3. 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 (BNS:kh 03-23-16)      03/19/2016 Procedure    Direct laryngoscopy was negative for abnormalities. Biopsies are taken from right neck LN      04/12/2016 PET scan    PET scan showed abnormal right station 2A lymph node measuring 1.1 cm in length with maximum SUV of 6.1, Deauville scale 4. There is some symmetric and probably physiologic activity along the lingual tonsils. Spleen normal, and no other adenopathy identified      05/11/2016 -  Chemotherapy    She received R-CHOP chemo       INTERVAL HISTORY: Please see below for problem oriented charting. She is seen prior to cycle 3 of treatment. She tolerated each cycle poorly. She has significant symptoms of heartburn and reflux. She is taking proton pump inhibitor. She has significant bone pain but ran out of prescription. She was instructed to come here for medication refill but did not do so. She sleep poorly at night. She had some problem with constipation but is not taking laxatives correctly. She denies peripheral neuropathy. Denies recent new lymphadenopathy She continues to smoke a few cigarettes per day  REVIEW OF SYSTEMS:   Constitutional: Denies fevers, chills or abnormal weight loss Eyes: Denies blurriness of vision Ears, nose, mouth, throat, and face: Denies mucositis or sore throat Respiratory: Denies cough, dyspnea or wheezes Cardiovascular: Denies palpitation, chest discomfort or lower  extremity swelling Skin: Denies abnormal skin rashes Lymphatics: Denies new lymphadenopathy or easy  bruising Neurological:Denies numbness, tingling or new weaknesses Behavioral/Psych: Mood is stable, no new changes  All other systems were reviewed with the patient and are negative.  I have reviewed the past medical history, past surgical history, social history and family history with the patient and they are unchanged from previous note.  ALLERGIES:  is allergic to lisinopril; penicillins; and prednisone.  MEDICATIONS:  Current Outpatient Prescriptions  Medication Sig Dispense Refill  . allopurinol (ZYLOPRIM) 300 MG tablet Take 1 tablet (300 mg total) by mouth daily. 30 tablet 0  . escitalopram (LEXAPRO) 10 MG tablet Take 10 mg by mouth daily.   2  . esomeprazole (NEXIUM) 40 MG capsule Take 1 capsule (40 mg total) by mouth daily. 90 capsule 3  . loratadine-pseudoephedrine (CLARITIN-D 24-HOUR) 10-240 MG 24 hr tablet Take 1 tablet by mouth daily.    . magic mouthwash w/lidocaine SOLN Take 5 mLs by mouth 4 (four) times daily. 240 mL 1  . mirtazapine (REMERON) 15 MG tablet Take 1 tablet (15 mg total) by mouth at bedtime. Stop taking Ambien 30 tablet 1  . nicotine (NICODERM CQ - DOSED IN MG/24 HOURS) 14 mg/24hr patch Place 1 patch (14 mg total) onto the skin daily. 21 patch 0  . ondansetron (ZOFRAN) 8 MG tablet Take 1 tablet (8 mg total) by mouth every 8 (eight) hours as needed for refractory nausea / vomiting. 30 tablet 1  . oxyCODONE (OXY IR/ROXICODONE) 5 MG immediate release tablet Take 1 tablet (5 mg total) by mouth every 4 (four) hours as needed for severe pain. 90 tablet 0  . polyethylene glycol (MIRALAX / GLYCOLAX) packet Take 17 g by mouth daily.    . prochlorperazine (COMPAZINE) 10 MG tablet Take 1 tablet (10 mg total) by mouth every 6 (six) hours as needed (Nausea or vomiting). 30 tablet 6  . senna (SENOKOT) 8.6 MG TABS tablet Take 1 tablet by mouth daily.    . valsartan-hydrochlorothiazide (DIOVAN-HCT) 160-25 MG tablet Take 1/2 tab PO Q AM. 15 tablet 4   No current  facility-administered medications for this visit.    Facility-Administered Medications Ordered in Other Visits  Medication Dose Route Frequency Provider Last Rate Last Dose  . cyclophosphamide (CYTOXAN) 1,260 mg in sodium chloride 0.9 % 250 mL chemo infusion  750 mg/m2 (Treatment Plan Recorded) Intravenous Once Heath Lark, MD 626 mL/hr at 06/22/16 1316 1,260 mg at 06/22/16 1316  . riTUXimab (RITUXAN) 600 mg in sodium chloride 0.9 % 250 mL (1.9355 mg/mL) chemo infusion  375 mg/m2 (Treatment Plan Recorded) Intravenous Once Heath Lark, MD        PHYSICAL EXAMINATION: ECOG PERFORMANCE STATUS: 1 - Symptomatic but completely ambulatory  Vitals:   06/22/16 1002  BP: (!) 145/79  Pulse: 77  Resp: 18  Temp: 98.3 F (36.8 C)   Filed Weights   06/22/16 1002  Weight: 150 lb 8 oz (68.3 kg)    GENERAL:alert, no distress and comfortable SKIN: skin color, texture, turgor are normal, no rashes or significant lesions EYES: normal, Conjunctiva are pink and non-injected, sclera clear OROPHARYNX:no exudate, no erythema and lips, buccal mucosa, and tongue normal  NECK: supple, thyroid normal size, non-tender, without nodularity LYMPH:  no palpable lymphadenopathy in the cervical, axillary or inguinal LUNGS: clear to auscultation and percussion with normal breathing effort HEART: regular rate & rhythm and no murmurs and no lower extremity edema ABDOMEN:abdomen soft, non-tender and normal bowel sounds Musculoskeletal:no  cyanosis of digits and no clubbing  NEURO: alert & oriented x 3 with fluent speech, no focal motor/sensory deficits  LABORATORY DATA:  I have reviewed the data as listed    Component Value Date/Time   NA 141 06/22/2016 0950   K 3.8 06/22/2016 0950   CL 102 06/07/2016 0009   CO2 24 06/22/2016 0950   GLUCOSE 92 06/22/2016 0950   BUN 15.0 06/22/2016 0950   CREATININE 0.7 06/22/2016 0950   CALCIUM 9.3 06/22/2016 0950   PROT 6.4 06/22/2016 0950   ALBUMIN 3.5 06/22/2016 0950   AST  16 06/22/2016 0950   ALT 15 06/22/2016 0950   ALKPHOS 92 06/22/2016 0950   BILITOT 0.32 06/22/2016 0950   GFRNONAA >60 06/07/2016 0009   GFRAA >60 06/07/2016 0009    No results found for: SPEP, UPEP  Lab Results  Component Value Date   WBC 5.7 06/22/2016   NEUTROABS 3.7 06/22/2016   HGB 11.9 06/22/2016   HCT 35.0 06/22/2016   MCV 93.6 06/22/2016   PLT 235 06/22/2016      Chemistry      Component Value Date/Time   NA 141 06/22/2016 0950   K 3.8 06/22/2016 0950   CL 102 06/07/2016 0009   CO2 24 06/22/2016 0950   BUN 15.0 06/22/2016 0950   CREATININE 0.7 06/22/2016 0950      Component Value Date/Time   CALCIUM 9.3 06/22/2016 0950   ALKPHOS 92 06/22/2016 0950   AST 16 06/22/2016 0950   ALT 15 06/22/2016 0950   BILITOT 0.32 06/22/2016 0950       RADIOGRAPHIC STUDIES: I have personally reviewed the radiological images as listed and agreed with the findings in the report. Dg Chest 2 View  Result Date: 06/06/2016 CLINICAL DATA:  All over chest pain. Patient is being treated for follicular lymphoma with chemotherapy. Shortness of breath. EXAM: CHEST  2 VIEW COMPARISON:  None. FINDINGS: The heart size and mediastinal contours are within normal limits. Both lungs are clear. Chronic anterior compression deformity of T12 vertebral body. IMPRESSION: No active cardiopulmonary disease. Electronically Signed   By: Fidela Salisbury M.D.   On: 06/06/2016 23:57    ASSESSMENT & PLAN:  Follicular lymphoma grade iiia, lymph nodes of head, face, and neck ([facility]) She tolerated treatment very poorly with multiple side effects. With Neulasta injection, she had significant pain and nausea to the point she has to be placed on narcotic prescription. The patient wants to proceed with cycle 3. She will proceed with treatment without dose adjustment and I will omit Neulasta We discussed neutropenic precaution. I recommend seeing her back next week for supportive care and she agreed. I plan to  repeat imaging study next month to assess response to treatment  Bone pain due to G-CSF She had profound bone pain from G-CSF injection. Oxycodone appears to relieve her pain. I refill her prescription for oxycodone and discussed potential side effects with narcotic use such as nausea and constipation. I recommend omitting G-CSF injection with the cycle of treatment and she agreed  Tobacco abuse The patient managed to cut down on cigarette smoking to 3 cigarettes per day. I congratulated her effort. I recommend a stepdown on her prescription nicotine patch   Gastroesophageal reflux disease She has significant reflux disease. I recommend Mylanta  Other constipation She has significant problem with constipation. I recommend laxative therapy   Orders Placed This Encounter  Procedures  . NM PET Image Restag (PS) Skull Base To Thigh  Standing Status:   Future    Standing Expiration Date:   07/27/2017    Order Specific Question:   Reason for exam:    Answer:   staging lymphoma, assess response to Rx    Order Specific Question:   Preferred imaging location?    Answer:   [facility]   All questions were answered. The patient knows to call the clinic with any problems, questions or concerns. No barriers to learning was detected. I spent 25 minutes counseling the patient face to face. The total time spent in the appointment was 40 minutes and more than 50% was on counseling and review of test results     Heath Lark, MD 06/22/2016 1:41 PM

## 2016-06-22 NOTE — Telephone Encounter (Signed)
Appointments scheduled per 1/9 LOS. Patient given AVS report and calendars with future scheduled appointments. °

## 2016-06-22 NOTE — Assessment & Plan Note (Signed)
The patient managed to cut down on cigarette smoking to 3 cigarettes per day. I congratulated her effort. I recommend a stepdown on her prescription nicotine patch

## 2016-06-22 NOTE — Assessment & Plan Note (Signed)
She had profound bone pain from G-CSF injection. Oxycodone appears to relieve her pain. I refill her prescription for oxycodone and discussed potential side effects with narcotic use such as nausea and constipation. I recommend omitting G-CSF injection with the cycle of treatment and she agreed

## 2016-06-22 NOTE — Assessment & Plan Note (Signed)
She has significant problem with constipation. I recommend laxative therapy

## 2016-06-29 ENCOUNTER — Encounter: Payer: Self-pay | Admitting: Hematology and Oncology

## 2016-06-29 ENCOUNTER — Ambulatory Visit (HOSPITAL_BASED_OUTPATIENT_CLINIC_OR_DEPARTMENT_OTHER): Payer: BLUE CROSS/BLUE SHIELD | Admitting: Hematology and Oncology

## 2016-06-29 ENCOUNTER — Encounter: Payer: Self-pay | Admitting: Nurse Practitioner

## 2016-06-29 ENCOUNTER — Ambulatory Visit (HOSPITAL_BASED_OUTPATIENT_CLINIC_OR_DEPARTMENT_OTHER): Payer: BLUE CROSS/BLUE SHIELD | Admitting: Nurse Practitioner

## 2016-06-29 VITALS — BP 150/77 | HR 68 | Temp 98.3°F | Resp 18

## 2016-06-29 DIAGNOSIS — E860 Dehydration: Secondary | ICD-10-CM

## 2016-06-29 DIAGNOSIS — R110 Nausea: Secondary | ICD-10-CM

## 2016-06-29 DIAGNOSIS — C8231 Follicular lymphoma grade IIIa, lymph nodes of head, face, and neck: Secondary | ICD-10-CM

## 2016-06-29 DIAGNOSIS — I959 Hypotension, unspecified: Secondary | ICD-10-CM

## 2016-06-29 MED ORDER — ONDANSETRON HCL 4 MG/2ML IJ SOLN
INTRAMUSCULAR | Status: AC
  Filled 2016-06-29: qty 4

## 2016-06-29 MED ORDER — SODIUM CHLORIDE 0.9 % IV SOLN: Freq: Once | INTRAVENOUS | Status: DC

## 2016-06-29 MED ORDER — SODIUM CHLORIDE 0.9 % IV SOLN
Freq: Once | INTRAVENOUS | Status: AC
  Administered 2016-06-29: 14:00:00 via INTRAVENOUS

## 2016-06-29 MED ORDER — ONDANSETRON HCL 4 MG/2ML IJ SOLN
8.0000 mg | Freq: Once | INTRAMUSCULAR | Status: AC
  Administered 2016-06-29: 8 mg via INTRAVENOUS

## 2016-06-29 NOTE — Assessment & Plan Note (Signed)
She has recent nausea after treatment. She is not able to tolerate oral anti-emetics I recommend IV fluid support along with IV antiemetics daily through the rest of the week

## 2016-06-29 NOTE — Assessment & Plan Note (Signed)
She complained of significant signs and symptoms of dehydration with dizziness, nausea with poor oral intake I recommend IV fluid support daily

## 2016-06-29 NOTE — Progress Notes (Signed)
Pt in [facility] for IV fluids. Tolerating well. Also eating 6" subway sandwich and a large cup of sweet tea as well as ginger ale. Resting quietly after consumed lunch.

## 2016-06-29 NOTE — Progress Notes (Signed)
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 Additionally, there are prominent right upper cervical and left supraclavicular lymph nodes. This may represent nodal metastasis or be related to a systemic inflammatory or lymphoproliferative process. No infectious/and process in the neck identified. Effacement of the right vallecula an asymmetric thickening of the right aryepiglottic fold, direct visualization is recommended to evaluate for mucosal lesion.      03/19/2016 Pathology Results    Accession: [id number] Histologic type: Follicular lymphoma, large cell type. Grade (if applicable): High grade, 3 A. Flow cytometry: B cell population with kappa light chain excess ([id number]) Immunohistochemical stains: CD20, CD3, CD10, BCL-2, BCL-6 and Ki-67 with appropriate controls. Touch preps/imprints: A mixture of small and large lymphoid cells. Comments: The sections show effacement of the lymph nodal architecture by numerous variably sized and ill defined atypical lymphoid follicles characterized by lack of polarity, attenuated or absent mantle zones and relatively homogeneous composition of small and large transformed and cleaved lymphoid cells. The number of large lymphoid cells varies in different follicles but generally average more than 15 cells / high power field . This is associated with scattered mitosis. No diffuse component is identified. To further evaluate this process, flow cytometric analysis was performed and shows a B cell population displaying expression of pan B cell antigens including CD20 associated  with CD10 and kappa light chain excess. Immunohistochemical stains were performed and show that the lymphoid follicles are positive for CD20, CD10, BCL-6 and BCL-2. Ki 67 shows variable increased expression ranging for 10% to >50% in many of the follicles. The atypical lymphoid follicles are surrounded by an abundant population of T cells as primarily seen with CD3. 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 (BNS:kh 03-23-16)      03/19/2016 Procedure    Direct laryngoscopy was negative for abnormalities. Biopsies are taken from right neck LN      04/12/2016 PET scan    PET scan showed abnormal right station 2A lymph node measuring 1.1 cm in length with maximum SUV of 6.1, Deauville scale 4. There is some symmetric and probably physiologic activity along the lingual tonsils. Spleen normal, and no other adenopathy identified      05/11/2016 - 06/22/2016 Chemotherapy    She received R-CHOP chemo x 3       INTERVAL HISTORY: Please see below for problem oriented charting. She is in today for supportive care She complained of dizziness, nausea but no vomiting She tried to take anti-emetics but had persistent nausea She complained of fatigue, dizziness and weakness after treatment Denies recent fever, chills or cough. She denies constipation.  REVIEW OF SYSTEMS:   Constitutional: Denies fevers, chills or abnormal weight loss Eyes: Denies blurriness of vision Ears, nose, mouth, throat, and face: Denies mucositis or sore throat Respiratory: Denies cough, dyspnea or wheezes Cardiovascular: Denies palpitation, chest discomfort or lower extremity swelling Skin: Denies abnormal skin rashes Lymphatics: Denies new lymphadenopathy or easy bruising Neurological:Denies numbness, tingling or new weaknesses Behavioral/Psych: Mood is stable, no new changes  All other systems were reviewed with the patient and are negative.  I have  reviewed the past medical history, past surgical  history, social history and family history with the patient and they are unchanged from previous note.  ALLERGIES:  is allergic to lisinopril; penicillins; and prednisone.  MEDICATIONS:  Current Outpatient Prescriptions  Medication Sig Dispense Refill  . escitalopram (LEXAPRO) 10 MG tablet Take 10 mg by mouth daily.   2  . esomeprazole (NEXIUM) 40 MG capsule Take 1 capsule (40 mg total) by mouth daily. 90 capsule 3  . loratadine-pseudoephedrine (CLARITIN-D 24-HOUR) 10-240 MG 24 hr tablet Take 1 tablet by mouth daily.    . magic mouthwash w/lidocaine SOLN Take 5 mLs by mouth 4 (four) times daily. 240 mL 1  . mirtazapine (REMERON) 15 MG tablet Take 1 tablet (15 mg total) by mouth at bedtime. Stop taking Ambien 30 tablet 1  . nicotine (NICODERM CQ - DOSED IN MG/24 HOURS) 14 mg/24hr patch Place 1 patch (14 mg total) onto the skin daily. 21 patch 0  . ondansetron (ZOFRAN) 8 MG tablet Take 1 tablet (8 mg total) by mouth every 8 (eight) hours as needed for refractory nausea / vomiting. 30 tablet 1  . oxyCODONE (OXY IR/ROXICODONE) 5 MG immediate release tablet Take 1 tablet (5 mg total) by mouth every 4 (four) hours as needed for severe pain. 90 tablet 0  . polyethylene glycol (MIRALAX / GLYCOLAX) packet Take 17 g by mouth daily.    . prochlorperazine (COMPAZINE) 10 MG tablet Take 1 tablet (10 mg total) by mouth every 6 (six) hours as needed (Nausea or vomiting). 30 tablet 6  . senna (SENOKOT) 8.6 MG TABS tablet Take 1 tablet by mouth daily.    . valsartan-hydrochlorothiazide (DIOVAN-HCT) 160-25 MG tablet Take 1/2 tab PO Q AM. 15 tablet 4   No current facility-administered medications for this visit.     PHYSICAL EXAMINATION: ECOG PERFORMANCE STATUS: 1 - Symptomatic but completely ambulatory  Vitals:   06/29/16 1231  BP: 134/70  Pulse: 77  Resp: 18  Temp: 98.1 F (36.7 C)   Filed Weights   06/29/16 1231  Weight: 150 lb 8 oz (68.3 kg)    GENERAL:alert, no distress and comfortable SKIN:  skin color, texture, turgor are normal, no rashes or significant lesions EYES: normal, Conjunctiva are pink and non-injected, sclera clear OROPHARYNX:no exudate, no erythema and lips, buccal mucosa, and tongue normal  NECK: supple, thyroid normal size, non-tender, without nodularity LYMPH:  no palpable lymphadenopathy in the cervical, axillary or inguinal LUNGS: clear to auscultation and percussion with normal breathing effort HEART: regular rate & rhythm and no murmurs and no lower extremity edema ABDOMEN:abdomen soft, non-tender and normal bowel sounds Musculoskeletal:no cyanosis of digits and no clubbing  NEURO: alert & oriented x 3 with fluent speech, no focal motor/sensory deficits  LABORATORY DATA:  I have reviewed the data as listed    Component Value Date/Time   NA 141 06/22/2016 0950   K 3.8 06/22/2016 0950   CL 102 06/07/2016 0009   CO2 24 06/22/2016 0950   GLUCOSE 92 06/22/2016 0950   BUN 15.0 06/22/2016 0950   CREATININE 0.7 06/22/2016 0950   CALCIUM 9.3 06/22/2016 0950   PROT 6.4 06/22/2016 0950   ALBUMIN 3.5 06/22/2016 0950   AST 16 06/22/2016 0950   ALT 15 06/22/2016 0950   ALKPHOS 92 06/22/2016 0950   BILITOT 0.32 06/22/2016 0950   GFRNONAA >60 06/07/2016 0009   GFRAA >60 06/07/2016 0009    No results found for: SPEP, UPEP  Lab Results  Component Value  Date   WBC 5.7 06/22/2016   NEUTROABS 3.7 06/22/2016   HGB 11.9 06/22/2016   HCT 35.0 06/22/2016   MCV 93.6 06/22/2016   PLT 235 06/22/2016      Chemistry      Component Value Date/Time   NA 141 06/22/2016 0950   K 3.8 06/22/2016 0950   CL 102 06/07/2016 0009   CO2 24 06/22/2016 0950   BUN 15.0 06/22/2016 0950   CREATININE 0.7 06/22/2016 0950      Component Value Date/Time   CALCIUM 9.3 06/22/2016 0950   ALKPHOS 92 06/22/2016 0950   AST 16 06/22/2016 0950   ALT 15 06/22/2016 0950   BILITOT 0.32 06/22/2016 0950       RADIOGRAPHIC STUDIES: I have personally reviewed the radiological images  as listed and agreed with the findings in the report. Dg Chest 2 View  Result Date: 06/06/2016 CLINICAL DATA:  All over chest pain. Patient is being treated for follicular lymphoma with chemotherapy. Shortness of breath. EXAM: CHEST  2 VIEW COMPARISON:  None. FINDINGS: The heart size and mediastinal contours are within normal limits. Both lungs are clear. Chronic anterior compression deformity of T12 vertebral body. IMPRESSION: No active cardiopulmonary disease. Electronically Signed   By: Fidela Salisbury M.D.   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 She is not able to tolerate oral anti-emetics I recommend IV fluid support along with IV antiemetics daily through the rest of the week   No orders of the defined types were placed in this encounter.  All questions were answered. The patient knows to call the clinic with any problems, questions or concerns. No barriers to learning was detected. I spent 15 minutes counseling the patient face to face. The total time spent in the appointment was 20 minutes and more than 50% was on counseling and review of test results     Heath Lark, MD 06/29/2016 4:27 PM

## 2016-06-29 NOTE — Progress Notes (Signed)
RN visit only. 

## 2016-06-29 NOTE — Patient Instructions (Signed)
Dehydration, Adult Dehydration is a condition in which there is not enough fluid or water in the body. This happens when you lose more fluids than you take in. Important organs, such as the kidneys, brain, and heart, cannot function without a proper amount of fluids. Any loss of fluids from the body can lead to dehydration. Dehydration can range from mild to severe. This condition should be treated right away to prevent it from becoming severe. What are the causes? This condition may be caused by:  Vomiting.  Diarrhea.  Excessive sweating, such as from heat exposure or exercise.  Not drinking enough fluid, especially:  When ill.  While doing activity that requires a lot of energy.  Excessive urination.  Fever.  Infection.  Certain medicines, such as medicines that cause the body to lose excess fluid (diuretics).  Inability to access safe drinking water.  Reduced physical ability to get adequate water and food. What increases the risk? This condition is more likely to develop in people:  Who have a poorly controlled long-term (chronic) illness, such as diabetes, heart disease, or kidney disease.  Who are age 65 or older.  Who are disabled.  Who live in a place with high altitude.  Who play endurance sports. What are the signs or symptoms? Symptoms of mild dehydration may include:   Thirst.  Dry lips.  Slightly dry mouth.  Dry, warm skin.  Dizziness. Symptoms of moderate dehydration may include:   Very dry mouth.  Muscle cramps.  Dark urine. Urine may be the color of tea.  Decreased urine production.  Decreased tear production.  Heartbeat that is irregular or faster than normal (palpitations).  Headache.  Light-headedness, especially when you stand up from a sitting position.  Fainting (syncope). Symptoms of severe dehydration may include:   Changes in skin, such as:  Cold and clammy skin.  Blotchy (mottled) or pale skin.  Skin that does  not quickly return to normal after being lightly pinched and released (poor skin turgor).  Changes in body fluids, such as:  Extreme thirst.  No tear production.  Inability to sweat when body temperature is high, such as in hot weather.  Very little urine production.  Changes in vital signs, such as:  Weak pulse.  Pulse that is more than 100 beats a minute when sitting still.  Rapid breathing.  Low blood pressure.  Other changes, such as:  Sunken eyes.  Cold hands and feet.  Confusion.  Lack of energy (lethargy).  Difficulty waking up from sleep.  Short-term weight loss.  Unconsciousness. How is this diagnosed? This condition is diagnosed based on your symptoms and a physical exam. Blood and urine tests may be done to help confirm the diagnosis. How is this treated? Treatment for this condition depends on the severity. Mild or moderate dehydration can often be treated at home. Treatment should be started right away. Do not wait until dehydration becomes severe. Severe dehydration is an emergency and it needs to be treated in a hospital. Treatment for mild dehydration may include:   Drinking more fluids.  Replacing salts and minerals in your blood (electrolytes) that you may have lost. Treatment for moderate dehydration may include:   Drinking an oral rehydration solution (ORS). This is a drink that helps you replace fluids and electrolytes (rehydrate). It can be found at pharmacies and retail stores. Treatment for severe dehydration may include:   Receiving fluids through an IV tube.  Receiving an electrolyte solution through a feeding tube that is   passed through your nose and into your stomach (nasogastric tube, or NG tube).  Correcting any abnormalities in electrolytes.  Treating the underlying cause of dehydration. Follow these instructions at home:  If directed by your health care provider, drink an ORS:  Make an ORS by following instructions on the  package.  Start by drinking small amounts, about  cup (120 mL) every 5-10 minutes.  Slowly increase how much you drink until you have taken the amount recommended by your health care provider.  Drink enough clear fluid to keep your urine clear or pale yellow. If you were told to drink an ORS, finish the ORS first, then start slowly drinking other clear fluids. Drink fluids such as:  Water. Do not drink only water. Doing that can lead to having too little salt (sodium) in the body (hyponatremia).  Ice chips.  Fruit juice that you have added water to (diluted fruit juice).  Low-calorie sports drinks.  Avoid:  Alcohol.  Drinks that contain a lot of sugar. These include high-calorie sports drinks, fruit juice that is not diluted, and soda.  Caffeine.  Foods that are greasy or contain a lot of fat or sugar.  Take over-the-counter and prescription medicines only as told by your health care provider.  Do not take sodium tablets. This can lead to having too much sodium in the body (hypernatremia).  Eat foods that contain a healthy balance of electrolytes, such as bananas, oranges, potatoes, tomatoes, and spinach.  Keep all follow-up visits as told by your health care provider. This is important. Contact a health care provider if:  You have abdominal pain that:  Gets worse.  Stays in one area (localizes).  You have a rash.  You have a stiff neck.  You are more irritable than usual.  You are sleepier or more difficult to wake up than usual.  You feel weak or dizzy.  You feel very thirsty.  You have urinated only a small amount of very dark urine over 6-8 hours. Get help right away if:  You have symptoms of severe dehydration.  You cannot drink fluids without vomiting.  Your symptoms get worse with treatment.  You have a fever.  You have a severe headache.  You have vomiting or diarrhea that:  Gets worse.  Does not go away.  You have blood or green matter  (bile) in your vomit.  You have blood in your stool. This may cause stool to look black and tarry.  You have not urinated in 6-8 hours.  You faint.  Your heart rate while sitting still is over 100 beats a minute.  You have trouble breathing. This information is not intended to replace advice given to you by your health care provider. Make sure you discuss any questions you have with your health care provider. Document Released: 05/31/2005 Document Revised: 12/26/2015 Document Reviewed: 07/25/2015 Elsevier Interactive Patient Education  2017 [employer]  

## 2016-06-29 NOTE — Assessment & Plan Note (Signed)
She has completed recent chemotherapy Continue aggressive supportive care

## 2016-06-30 ENCOUNTER — Ambulatory Visit: Payer: Self-pay | Admitting: Nurse Practitioner

## 2016-07-01 ENCOUNTER — Telehealth: Payer: Self-pay | Admitting: *Deleted

## 2016-07-01 ENCOUNTER — Ambulatory Visit: Payer: Self-pay | Admitting: Nurse Practitioner

## 2016-07-01 NOTE — Telephone Encounter (Signed)
TCT patient.  Spoke with pt's husband.  He states they will not bwe here for IV fluids today d/t the weather.  [place] He states that Twinkle is eating and drinking fluids fairly well. He states she is still feeling fatigues but denies any dizzyness, light headedness.  Taking nausea meds as needed.  Pt's husband states they will see how the day goes and then call us in the morning to let us know if she is coming in for IV fluids or not.

## 2016-07-02 ENCOUNTER — Telehealth: Payer: Self-pay | Admitting: *Deleted

## 2016-07-02 ENCOUNTER — Ambulatory Visit: Payer: Self-pay | Admitting: Nurse Practitioner

## 2016-07-02 NOTE — Telephone Encounter (Signed)
TCT patient/pt's husband. Spoke with pt's husband, Emily Santiago.  He states she is doing better , 'on the upswing'. Drinking fluids well, eating and able to do some small chores and activities around the house.  He states she does not need to come in for fluids today or tomorrow. These appointments canceled.  He is aware of upcoming scan and lab appt and appt with Dr. Alvy Bimler.  No other concerns verbalized.

## 2016-07-03 ENCOUNTER — Ambulatory Visit: Payer: Self-pay

## 2016-07-05 ENCOUNTER — Telehealth: Payer: Self-pay

## 2016-07-05 ENCOUNTER — Other Ambulatory Visit: Payer: Self-pay | Admitting: Hematology and Oncology

## 2016-07-05 ENCOUNTER — Encounter: Payer: Self-pay | Admitting: Hematology and Oncology

## 2016-07-05 ENCOUNTER — Ambulatory Visit (HOSPITAL_BASED_OUTPATIENT_CLINIC_OR_DEPARTMENT_OTHER): Payer: BLUE CROSS/BLUE SHIELD

## 2016-07-05 ENCOUNTER — Ambulatory Visit (HOSPITAL_BASED_OUTPATIENT_CLINIC_OR_DEPARTMENT_OTHER): Payer: BLUE CROSS/BLUE SHIELD | Admitting: Hematology and Oncology

## 2016-07-05 DIAGNOSIS — D702 Other drug-induced agranulocytosis: Secondary | ICD-10-CM

## 2016-07-05 DIAGNOSIS — R110 Nausea: Secondary | ICD-10-CM

## 2016-07-05 DIAGNOSIS — R300 Dysuria: Secondary | ICD-10-CM | POA: Diagnosis not present

## 2016-07-05 DIAGNOSIS — D701 Agranulocytosis secondary to cancer chemotherapy: Secondary | ICD-10-CM

## 2016-07-05 DIAGNOSIS — C8231 Follicular lymphoma grade IIIa, lymph nodes of head, face, and neck: Secondary | ICD-10-CM | POA: Diagnosis not present

## 2016-07-05 DIAGNOSIS — N3090 Cystitis, unspecified without hematuria: Secondary | ICD-10-CM

## 2016-07-05 LAB — CBC WITH DIFFERENTIAL/PLATELET
BASO%: 0.4 % (ref 0.0–2.0)
Basophils Absolute: 0 10*3/uL (ref 0.0–0.1)
EOS%: 8 % — ABNORMAL HIGH (ref 0.0–7.0)
Eosinophils Absolute: 0.1 10*3/uL (ref 0.0–0.5)
HCT: 35.4 % (ref 34.8–46.6)
HGB: 12 g/dL (ref 11.6–15.9)
LYMPH%: 33.8 % (ref 14.0–49.7)
MCH: 32.1 pg (ref 25.1–34.0)
MCHC: 34 g/dL (ref 31.5–36.0)
MCV: 94.4 fL (ref 79.5–101.0)
MONO#: 0.3 10*3/uL (ref 0.1–0.9)
MONO%: 18.1 % — ABNORMAL HIGH (ref 0.0–14.0)
NEUT#: 0.6 10*3/uL — ABNORMAL LOW (ref 1.5–6.5)
NEUT%: 39.7 % (ref 38.4–76.8)
Platelets: 140 10*3/uL — ABNORMAL LOW (ref 145–400)
RBC: 3.75 10*6/uL (ref 3.70–5.45)
RDW: 16.4 % — ABNORMAL HIGH (ref 11.2–14.5)
WBC: 1.6 10*3/uL — ABNORMAL LOW (ref 3.9–10.3)
lymph#: 0.5 10*3/uL — ABNORMAL LOW (ref 0.9–3.3)

## 2016-07-05 LAB — URINALYSIS, MICROSCOPIC - CHCC
Bilirubin (Urine): NEGATIVE
Glucose: NEGATIVE mg/dL
Ketones: NEGATIVE mg/dL
Nitrite: NEGATIVE
Protein: NEGATIVE mg/dL
Specific Gravity, Urine: 1.005 (ref 1.003–1.035)
Urobilinogen, UR: 0.2 mg/dL (ref 0.2–1)
pH: 8 (ref 4.6–8.0)

## 2016-07-05 LAB — COMPREHENSIVE METABOLIC PANEL
ALT: 6 U/L (ref 0–55)
AST: 12 U/L (ref 5–34)
Albumin: 3.8 g/dL (ref 3.5–5.0)
Alkaline Phosphatase: 87 U/L (ref 40–150)
Anion Gap: 9 mEq/L (ref 3–11)
BUN: 10.6 mg/dL (ref 7.0–26.0)
CO2: 26 mEq/L (ref 22–29)
Calcium: 9.4 mg/dL (ref 8.4–10.4)
Chloride: 106 mEq/L (ref 98–109)
Creatinine: 0.7 mg/dL (ref 0.6–1.1)
EGFR: >90 mL/min/{1.73_m2} (ref >90)
Glucose: 63 mg/dl — ABNORMAL LOW (ref 70–140)
Potassium: 3.8 mEq/L (ref 3.5–5.1)
Sodium: 141 mEq/L (ref 136–145)
Total Bilirubin: 0.43 mg/dL (ref 0.20–1.20)
Total Protein: 6.9 g/dL (ref 6.4–8.3)

## 2016-07-05 MED ORDER — CIPROFLOXACIN HCL 500 MG PO TABS: 500.0000 mg | ORAL_TABLET | Freq: Two times a day (BID) | ORAL | 0 refills | Status: DC

## 2016-07-05 NOTE — Assessment & Plan Note (Signed)
She is neutropenic due to recent chemotherapy She is at high risk for infection I recommend ciprofloxacin for presumed urinary tract infection.

## 2016-07-05 NOTE — Assessment & Plan Note (Signed)
She has completed recent chemotherapy Continue aggressive supportive care

## 2016-07-05 NOTE — Telephone Encounter (Signed)
Pt called stating she is having a kidney infection Burning, back is painful, urination is painful, frequency but small stream. Is drinking lots of water. Last chemo on 1/9. Uses [facility].  S/w Dr Alvy Bimler and pt to come in for labs and to see Dr Alvy Bimler. Instructed pt to come in right away.   Kim made lab appt for 115, sent inbasket for add on MD after the lab.

## 2016-07-05 NOTE — Assessment & Plan Note (Signed)
She has recent nausea after treatment. She is able to tolerate oral anti-emetics I recommend increase oral fluid intake, treat for presumably urinary tract infection and continue oral anti-emetics

## 2016-07-05 NOTE — Progress Notes (Addendum)
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 Additionally, there are prominent right upper cervical and left supraclavicular lymph nodes. This may represent nodal metastasis or be related to a systemic inflammatory or lymphoproliferative process. No infectious/and process in the neck identified. Effacement of the right vallecula an asymmetric thickening of the right aryepiglottic fold, direct visualization is recommended to evaluate for mucosal lesion.      03/19/2016 Pathology Results    Accession: [id number] Histologic type: Follicular lymphoma, large cell type. Grade (if applicable): High grade, 3 A. Flow cytometry: B cell population with kappa light chain excess ([id number]) Immunohistochemical stains: CD20, CD3, CD10, BCL-2, BCL-6 and Ki-67 with appropriate controls. Touch preps/imprints: A mixture of small and large lymphoid cells. Comments: The sections show effacement of the lymph nodal architecture by numerous variably sized and ill defined atypical lymphoid follicles characterized by lack of polarity, attenuated or absent mantle zones and relatively homogeneous composition of small and large transformed and cleaved lymphoid cells. The number of large lymphoid cells varies in different follicles but generally average more than 15 cells / high power field . This is associated with scattered mitosis. No diffuse component is identified. To further evaluate this process, flow cytometric analysis was performed and shows a B cell population displaying expression of pan B cell antigens including CD20 associated  with CD10 and kappa light chain excess. Immunohistochemical stains were performed and show that the lymphoid follicles are positive for CD20, CD10, BCL-6 and BCL-2. Ki 67 shows variable increased expression ranging for 10% to >50% in many of the follicles. The atypical lymphoid follicles are surrounded by an abundant population of T cells as primarily seen with CD3. 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 (BNS:kh 03-23-16)      03/19/2016 Procedure    Direct laryngoscopy was negative for abnormalities. Biopsies are taken from right neck LN      04/12/2016 PET scan    PET scan showed abnormal right station 2A lymph node measuring 1.1 cm in length with maximum SUV of 6.1, Deauville scale 4. There is some symmetric and probably physiologic activity along the lingual tonsils. Spleen normal, and no other adenopathy identified      05/11/2016 - 06/22/2016 Chemotherapy    She received R-CHOP chemo x 3       INTERVAL HISTORY: Please see below for problem oriented charting. She is seen urgently today because of symptoms of nausea, suprapubic discomfort and lower back discomfort She took half of oxycodone last week without success of treating her pain She complained of dysuria, frequency and urgency Denies hematuria Denies recent constipation  REVIEW OF SYSTEMS:   Constitutional: Denies fevers, chills or abnormal weight loss Eyes: Denies blurriness of vision Ears, nose, mouth, throat, and face: Denies mucositis or sore throat Respiratory: Denies cough, dyspnea or wheezes Cardiovascular: Denies palpitation, chest discomfort or lower extremity swelling Gastrointestinal:  Denies nausea, heartburn or change in bowel habits Skin: Denies abnormal skin rashes Lymphatics: Denies new lymphadenopathy or easy bruising Neurological:Denies numbness, tingling or new weaknesses Behavioral/Psych: Mood is stable, no new changes  All other systems were reviewed  with the patient and are  negative.  I have reviewed the past medical history, past surgical history, social history and family history with the patient and they are unchanged from previous note.  ALLERGIES:  is allergic to lisinopril; penicillins; and prednisone.  MEDICATIONS:  Current Outpatient Prescriptions  Medication Sig Dispense Refill  . escitalopram (LEXAPRO) 10 MG tablet Take 10 mg by mouth daily.   2  . esomeprazole (NEXIUM) 40 MG capsule Take 1 capsule (40 mg total) by mouth daily. 90 capsule 3  . loratadine-pseudoephedrine (CLARITIN-D 24-HOUR) 10-240 MG 24 hr tablet Take 1 tablet by mouth daily.    . magic mouthwash w/lidocaine SOLN Take 5 mLs by mouth 4 (four) times daily. 240 mL 1  . mirtazapine (REMERON) 15 MG tablet Take 1 tablet (15 mg total) by mouth at bedtime. Stop taking Ambien 30 tablet 1  . nicotine (NICODERM CQ - DOSED IN MG/24 HOURS) 14 mg/24hr patch Place 1 patch (14 mg total) onto the skin daily. 21 patch 0  . ondansetron (ZOFRAN) 8 MG tablet Take 1 tablet (8 mg total) by mouth every 8 (eight) hours as needed for refractory nausea / vomiting. 30 tablet 1  . oxyCODONE (OXY IR/ROXICODONE) 5 MG immediate release tablet Take 1 tablet (5 mg total) by mouth every 4 (four) hours as needed for severe pain. 90 tablet 0  . polyethylene glycol (MIRALAX / GLYCOLAX) packet Take 17 g by mouth daily.    . prochlorperazine (COMPAZINE) 10 MG tablet Take 1 tablet (10 mg total) by mouth every 6 (six) hours as needed (Nausea or vomiting). 30 tablet 6  . senna (SENOKOT) 8.6 MG TABS tablet Take 1 tablet by mouth daily.    . valsartan-hydrochlorothiazide (DIOVAN-HCT) 160-25 MG tablet Take 1/2 tab PO Q AM. 15 tablet 4  . ciprofloxacin (CIPRO) 500 MG tablet Take 1 tablet (500 mg total) by mouth 2 (two) times daily. 14 tablet 0   No current facility-administered medications for this visit.     PHYSICAL EXAMINATION: ECOG PERFORMANCE STATUS: 1 - Symptomatic but completely ambulatory  Vitals:   07/05/16 1426   BP: (!) 163/86  Pulse: 79  Resp: 16  Temp: 98.2 F (36.8 C)   Filed Weights   07/05/16 1426  Weight: 149 lb 4.8 oz (67.7 kg)    GENERAL:alert, no distress and comfortable SKIN: skin color, texture, turgor are normal, no rashes or significant lesions EYES: normal, Conjunctiva are pink and non-injected, sclera clear OROPHARYNX:no exudate, no erythema and lips, buccal mucosa, and tongue normal  NECK: supple, thyroid normal size, non-tender, without nodularity LYMPH:  no palpable lymphadenopathy in the cervical, axillary or inguinal LUNGS: clear to auscultation and percussion with normal breathing effort HEART: regular rate & rhythm and no murmurs and no lower extremity edema ABDOMEN:abdomen soft, with mild suprapubic discomfort Musculoskeletal:no cyanosis of digits and no clubbing  NEURO: alert & oriented x 3 with fluent speech, no focal motor/sensory deficits  LABORATORY DATA:  I have reviewed the data as listed    Component Value Date/Time   NA 141 07/05/2016 1405   K 3.8 07/05/2016 1405   CL 102 06/07/2016 0009   CO2 26 07/05/2016 1405   GLUCOSE 63 (L) 07/05/2016 1405   BUN 10.6 07/05/2016 1405   CREATININE 0.7 07/05/2016 1405   CALCIUM 9.4 07/05/2016 1405   PROT 6.9 07/05/2016 1405   ALBUMIN 3.8 07/05/2016 1405   AST 12 07/05/2016 1405   ALT 6 07/05/2016 1405   ALKPHOS 87 07/05/2016 1405  BILITOT 0.43 07/05/2016 1405   GFRNONAA >60 06/07/2016 0009   GFRAA >60 06/07/2016 0009    No results found for: SPEP, UPEP  Lab Results  Component Value Date   WBC 1.6 (L) 07/05/2016   NEUTROABS 0.6 (L) 07/05/2016   HGB 12.0 07/05/2016   HCT 35.4 07/05/2016   MCV 94.4 07/05/2016   PLT 140 (L) 07/05/2016      Chemistry      Component Value Date/Time   NA 141 07/05/2016 1405   K 3.8 07/05/2016 1405   CL 102 06/07/2016 0009   CO2 26 07/05/2016 1405   BUN 10.6 07/05/2016 1405   CREATININE 0.7 07/05/2016 1405      Component Value Date/Time   CALCIUM 9.4 07/05/2016  1405   ALKPHOS 87 07/05/2016 1405   AST 12 07/05/2016 1405   ALT 6 07/05/2016 1405   BILITOT 0.43 07/05/2016 1405       RADIOGRAPHIC STUDIES: I have personally reviewed the radiological images as listed and agreed with the findings in the report. Dg Chest 2 View  Result Date: 06/06/2016 CLINICAL DATA:  All over chest pain. Patient is being treated for follicular lymphoma with chemotherapy. Shortness of breath. EXAM: CHEST  2 VIEW COMPARISON:  None. FINDINGS: The heart size and mediastinal contours are within normal limits. Both lungs are clear. Chronic anterior compression deformity of T12 vertebral body. IMPRESSION: No active cardiopulmonary disease. Electronically Signed   By: Fidela Salisbury M.D.   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 She is able to tolerate oral anti-emetics I recommend increase oral fluid intake, treat for presumably urinary tract infection and continue oral anti-emetics  Drug-induced neutropenia ([facility]) She is neutropenic due to recent chemotherapy She is at high risk for infection I recommend ciprofloxacin for presumed urinary tract infection.   No orders of the defined types were placed in this encounter.  All questions were answered. The patient knows to call the clinic with any problems, questions or concerns. No barriers to learning was detected. I  spent 15 minutes counseling the patient face to face. The total time spent in the appointment was 20 minutes and more than 50% was on counseling and review of test results     Heath Lark, MD 07/05/2016 2:50 PM

## 2016-07-05 NOTE — Assessment & Plan Note (Signed)
Her symptoms of nausea, dysuria, suprapubic discomfort are highly suggestive of urinary tract infection Cystitis from Cytoxan is also a possibility I recommend urinalysis and urine culture I will proceed to prescribed oral antibiotic therapy due to neutropenia I will call her and her husband for test results I also recommend she takes oxycodone as needed for pain

## 2016-07-07 LAB — URINE CULTURE: Organism ID, Bacteria: NO GROWTH

## 2016-07-13 ENCOUNTER — Encounter (HOSPITAL_COMMUNITY)
Admission: RE | Admit: 2016-07-13 | Discharge: 2016-07-13 | Disposition: A | Discharge status: Home / Self Care | Payer: BLUE CROSS/BLUE SHIELD | Source: Ambulatory Visit | Attending: Hematology and Oncology | Admitting: Hematology and Oncology

## 2016-07-13 DIAGNOSIS — C8231 Follicular lymphoma grade IIIa, lymph nodes of head, face, and neck: Secondary | ICD-10-CM | POA: Insufficient documentation

## 2016-07-13 DIAGNOSIS — C8290 Follicular lymphoma, unspecified, unspecified site: Secondary | ICD-10-CM | POA: Diagnosis not present

## 2016-07-13 LAB — GLUCOSE, CAPILLARY: Glucose-Capillary: 94 mg/dL (ref 65–99)

## 2016-07-13 MED ORDER — FLUDEOXYGLUCOSE F - 18 (FDG) INJECTION
7.2000 | Freq: Once | INTRAVENOUS | Status: AC | PRN
  Administered 2016-07-13: 7.2 via INTRAVENOUS

## 2016-07-15 ENCOUNTER — Telehealth: Payer: Self-pay | Admitting: *Deleted

## 2016-07-15 NOTE — Telephone Encounter (Signed)
PET scan showed no lymphoma

## 2016-07-15 NOTE — Telephone Encounter (Signed)
Called patient with Provider results.  "At least I have peace of mind."

## 2016-07-15 NOTE — Telephone Encounter (Signed)
"  I would like a call from [place] [phone]).  I'd like the results of Tuesday's scans.  The suspense is killing me."   Next scheduled F/U 07-20-2016.  "I still would like a call either way with results or a call that I have to wait."

## 2016-07-20 ENCOUNTER — Encounter: Payer: Self-pay | Admitting: *Deleted

## 2016-07-20 ENCOUNTER — Other Ambulatory Visit (HOSPITAL_BASED_OUTPATIENT_CLINIC_OR_DEPARTMENT_OTHER): Payer: BLUE CROSS/BLUE SHIELD

## 2016-07-20 ENCOUNTER — Ambulatory Visit (HOSPITAL_BASED_OUTPATIENT_CLINIC_OR_DEPARTMENT_OTHER): Payer: BLUE CROSS/BLUE SHIELD | Admitting: Hematology and Oncology

## 2016-07-20 ENCOUNTER — Telehealth: Payer: Self-pay | Admitting: Hematology and Oncology

## 2016-07-20 VITALS — BP 168/93 | HR 66 | Temp 98.6°F | Resp 16 | Ht 61.0 in | Wt 156.2 lb

## 2016-07-20 DIAGNOSIS — K219 Gastro-esophageal reflux disease without esophagitis: Secondary | ICD-10-CM

## 2016-07-20 DIAGNOSIS — C8231 Follicular lymphoma grade IIIa, lymph nodes of head, face, and neck: Secondary | ICD-10-CM

## 2016-07-20 DIAGNOSIS — I10 Essential (primary) hypertension: Secondary | ICD-10-CM | POA: Diagnosis not present

## 2016-07-20 DIAGNOSIS — Z720 Tobacco use: Secondary | ICD-10-CM

## 2016-07-20 LAB — CBC WITH DIFFERENTIAL/PLATELET
BASO%: 0.1 % (ref 0.0–2.0)
Basophils Absolute: 0 10*3/uL (ref 0.0–0.1)
EOS%: 1.3 % (ref 0.0–7.0)
Eosinophils Absolute: 0.1 10*3/uL (ref 0.0–0.5)
HCT: 38.2 % (ref 34.8–46.6)
HGB: 13.1 g/dL (ref 11.6–15.9)
LYMPH%: 18.7 % (ref 14.0–49.7)
MCH: 32.1 pg (ref 25.1–34.0)
MCHC: 34.2 g/dL (ref 31.5–36.0)
MCV: 93.8 fL (ref 79.5–101.0)
MONO#: 0.6 10*3/uL (ref 0.1–0.9)
MONO%: 8.3 % (ref 0.0–14.0)
NEUT#: 5.1 10*3/uL (ref 1.5–6.5)
NEUT%: 71.6 % (ref 38.4–76.8)
Platelets: 214 10*3/uL (ref 145–400)
RBC: 4.07 10*6/uL (ref 3.70–5.45)
RDW: 15.9 % — ABNORMAL HIGH (ref 11.2–14.5)
WBC: 7.1 10*3/uL (ref 3.9–10.3)
lymph#: 1.3 10*3/uL (ref 0.9–3.3)

## 2016-07-20 LAB — COMPREHENSIVE METABOLIC PANEL
ALT: 8 U/L (ref 0–55)
AST: 12 U/L (ref 5–34)
Albumin: 3.6 g/dL (ref 3.5–5.0)
Alkaline Phosphatase: 94 U/L (ref 40–150)
Anion Gap: 9 mEq/L (ref 3–11)
BUN: 12.9 mg/dL (ref 7.0–26.0)
CO2: 26 mEq/L (ref 22–29)
Calcium: 9.5 mg/dL (ref 8.4–10.4)
Chloride: 105 mEq/L (ref 98–109)
Creatinine: 0.7 mg/dL (ref 0.6–1.1)
EGFR: >90 mL/min/{1.73_m2} (ref >90)
Glucose: 84 mg/dl (ref 70–140)
Potassium: 4 mEq/L (ref 3.5–5.1)
Sodium: 141 mEq/L (ref 136–145)
Total Bilirubin: 0.33 mg/dL (ref 0.20–1.20)
Total Protein: 6.7 g/dL (ref 6.4–8.3)

## 2016-07-20 MED ORDER — OXYCODONE HCL 5 MG PO TABS: 5.0000 mg | ORAL_TABLET | ORAL | 0 refills | Status: DC | PRN

## 2016-07-20 NOTE — Telephone Encounter (Signed)
Appointments scheduled per 2/6 LOS. Patient given AVS report and calendars with future scheduled appointments. °

## 2016-07-22 ENCOUNTER — Encounter: Payer: Self-pay | Admitting: Hematology and Oncology

## 2016-07-22 NOTE — Assessment & Plan Note (Signed)
The patient managed to cut down on cigarette smoking to 3 cigarettes per day. I congratulated her effort. I recommend a stepdown on her prescription nicotine patch

## 2016-07-22 NOTE — Assessment & Plan Note (Signed)
She has significant reflux disease. I recommend Mylanta along with her proton pump inhibitor

## 2016-07-22 NOTE — Assessment & Plan Note (Signed)
I reviewed imaging study with the patient and her husband. I also printed to current guidelines. 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 PET CT scan showed complete response. We discussed the role of adjuvant radiation treatment. I recommend consultation with radiation oncologist but ultimately she declined. I will continue close observation with history, physical examination and blood work every 3 months and imaging study in 6 months. We also discussed the role of maintenance rituximab but she declined for now

## 2016-07-22 NOTE — Assessment & Plan Note (Signed)
She has intermittent high blood pressure. I suspect it is an element of white coat hypertension. She will continue close follow-up with her primary care doctor for medication adjustment

## 2016-07-22 NOTE — Progress Notes (Signed)
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 Additionally, there are prominent right upper cervical and left supraclavicular lymph nodes. This may represent nodal metastasis or be related to a systemic inflammatory or lymphoproliferative process. No infectious/and process in the neck identified. Effacement of the right vallecula an asymmetric thickening of the right aryepiglottic fold, direct visualization is recommended to evaluate for mucosal lesion.      03/19/2016 Pathology Results    Accession: [id number] Histologic type: Follicular lymphoma, large cell type. Grade (if applicable): High grade, 3 A. Flow cytometry: B cell population with kappa light chain excess ([id number]) Immunohistochemical stains: CD20, CD3, CD10, BCL-2, BCL-6 and Ki-67 with appropriate controls. Touch preps/imprints: A mixture of small and large lymphoid cells. Comments: The sections show effacement of the lymph nodal architecture by numerous variably sized and ill defined atypical lymphoid follicles characterized by lack of polarity, attenuated or absent mantle zones and relatively homogeneous composition of small and large transformed and cleaved lymphoid cells. The number of large lymphoid cells varies in different follicles but generally average more than 15 cells / high power field . This is associated with scattered mitosis. No diffuse component is identified. To further evaluate this process, flow cytometric analysis was performed and shows a B cell population displaying expression of pan B cell antigens including CD20 associated  with CD10 and kappa light chain excess. Immunohistochemical stains were performed and show that the lymphoid follicles are positive for CD20, CD10, BCL-6 and BCL-2. Ki 67 shows variable increased expression ranging for 10% to >50% in many of the follicles. The atypical lymphoid follicles are surrounded by an abundant population of T cells as primarily seen with CD3. 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 (BNS:kh 03-23-16)      03/19/2016 Procedure    Direct laryngoscopy was negative for abnormalities. Biopsies are taken from right neck LN      04/12/2016 PET scan    PET scan showed abnormal right station 2A lymph node measuring 1.1 cm in length with maximum SUV of 6.1, Deauville scale 4. There is some symmetric and probably physiologic activity along the lingual tonsils. Spleen normal, and no other adenopathy identified      05/11/2016 - 06/22/2016 Chemotherapy    She received R-CHOP chemo x 3      07/13/2016 PET scan    Interval resolution of hypermetabolic right cervical level 2A lymph node. No evidence of metabolically active lymphoma.       INTERVAL HISTORY: Please see below for problem oriented charting. She returns for follow-up. I reviewed imaging study with the patient and her husband. She is recovering slowly from side effects of treatment. She continues to complain of fatigue. She has some nausea/esophagitis. No new lymphadenopathy  REVIEW OF SYSTEMS:   Constitutional: Denies fevers, chills or abnormal weight loss Eyes: Denies blurriness of vision Ears, nose, mouth, throat, and face: Denies mucositis or sore throat Respiratory: Denies cough, dyspnea or wheezes Cardiovascular: Denies palpitation, chest discomfort or lower extremity swelling Skin: Denies abnormal skin rashes Lymphatics: Denies new lymphadenopathy or easy bruising Neurological:Denies numbness, tingling or new weaknesses Behavioral/Psych:  Mood is stable, no new changes  All other  systems were reviewed with the patient and are negative.  I have reviewed the past medical history, past surgical history, social history and family history with the patient and they are unchanged from previous note.  ALLERGIES:  is allergic to lisinopril; penicillins; and prednisone.  MEDICATIONS:  Current Outpatient Prescriptions  Medication Sig Dispense Refill  . escitalopram (LEXAPRO) 10 MG tablet Take 10 mg by mouth daily.   2  . esomeprazole (NEXIUM) 40 MG capsule Take 1 capsule (40 mg total) by mouth daily. 90 capsule 3  . magic mouthwash w/lidocaine SOLN Take 5 mLs by mouth 4 (four) times daily. 240 mL 1  . mirtazapine (REMERON) 15 MG tablet Take 1 tablet (15 mg total) by mouth at bedtime. Stop taking Ambien 30 tablet 1  . nicotine (NICODERM CQ - DOSED IN MG/24 HOURS) 14 mg/24hr patch Place 1 patch (14 mg total) onto the skin daily. 21 patch 0  . ondansetron (ZOFRAN) 8 MG tablet Take 1 tablet (8 mg total) by mouth every 8 (eight) hours as needed for refractory nausea / vomiting. 30 tablet 1  . oxyCODONE (OXY IR/ROXICODONE) 5 MG immediate release tablet Take 1 tablet (5 mg total) by mouth every 4 (four) hours as needed for severe pain. 90 tablet 0  . polyethylene glycol (MIRALAX / GLYCOLAX) packet Take 17 g by mouth daily.    . prochlorperazine (COMPAZINE) 10 MG tablet Take 1 tablet (10 mg total) by mouth every 6 (six) hours as needed (Nausea or vomiting). 30 tablet 6  . senna (SENOKOT) 8.6 MG TABS tablet Take 1 tablet by mouth daily.    . valsartan-hydrochlorothiazide (DIOVAN-HCT) 160-25 MG tablet Take 1/2 tab PO Q AM. 15 tablet 4  . loratadine-pseudoephedrine (CLARITIN-D 24-HOUR) 10-240 MG 24 hr tablet Take 1 tablet by mouth daily.     No current facility-administered medications for this visit.     PHYSICAL EXAMINATION: ECOG PERFORMANCE STATUS: 1 - Symptomatic but completely ambulatory  Vitals:   07/20/16 1025  BP: (!) 168/93  Pulse: 66  Resp: 16  Temp: 98.6 F (37 C)    Filed Weights   07/20/16 1025  Weight: 156 lb 3.2 oz (70.9 kg)    GENERAL:alert, no distress and comfortable SKIN: skin color, texture, turgor are normal, no rashes or significant lesions EYES: normal, Conjunctiva are pink and non-injected, sclera clear OROPHARYNX:no exudate, no erythema and lips, buccal mucosa, and tongue normal  NECK: supple, thyroid normal size, non-tender, without nodularity LYMPH:  no palpable lymphadenopathy in the cervical, axillary or inguinal LUNGS: clear to auscultation and percussion with normal breathing effort HEART: regular rate & rhythm and no murmurs and no lower extremity edema ABDOMEN:abdomen soft, non-tender and normal bowel sounds Musculoskeletal:no cyanosis of digits and no clubbing  NEURO: alert & oriented x 3 with fluent speech, no focal motor/sensory deficits  LABORATORY DATA:  I have reviewed the data as listed    Component Value Date/Time   NA 141 07/20/2016 0951   K 4.0 07/20/2016 0951   CL 102 06/07/2016 0009   CO2 26 07/20/2016 0951   GLUCOSE 84 07/20/2016 0951   BUN 12.9 07/20/2016 0951   CREATININE 0.7 07/20/2016 0951   CALCIUM 9.5 07/20/2016 0951   PROT 6.7 07/20/2016 0951   ALBUMIN 3.6 07/20/2016 0951   AST 12 07/20/2016 0951   ALT 8 07/20/2016 0951   ALKPHOS 94 07/20/2016 0951   BILITOT 0.33 07/20/2016 0951   GFRNONAA >60 06/07/2016  0009   GFRAA >60 06/07/2016 0009    No results found for: SPEP, UPEP  Lab Results  Component Value Date   WBC 7.1 07/20/2016   NEUTROABS 5.1 07/20/2016   HGB 13.1 07/20/2016   HCT 38.2 07/20/2016   MCV 93.8 07/20/2016   PLT 214 07/20/2016      Chemistry      Component Value Date/Time   NA 141 07/20/2016 0951   K 4.0 07/20/2016 0951   CL 102 06/07/2016 0009   CO2 26 07/20/2016 0951   BUN 12.9 07/20/2016 0951   CREATININE 0.7 07/20/2016 0951      Component Value Date/Time   CALCIUM 9.5 07/20/2016 0951   ALKPHOS 94 07/20/2016 0951   AST 12 07/20/2016 0951   ALT 8  07/20/2016 0951   BILITOT 0.33 07/20/2016 0951       RADIOGRAPHIC STUDIES: I have personally reviewed the radiological images as listed and agreed with the findings in the report. Nm Pet Image Restag (ps) Skull Base To Thigh  Result Date: 07/13/2016 CLINICAL DATA:  Subsequent treatment strategy for follicular lymphoma. Status post chemotherapy. EXAM: NUCLEAR MEDICINE PET SKULL BASE TO THIGH TECHNIQUE: 7.2 mCi F-18 FDG was injected intravenously. Full-ring PET imaging was performed from the skull base to thigh after the radiotracer. CT data was obtained and used for attenuation correction and anatomic localization. FASTING BLOOD GLUCOSE:  Value: 94 mg/dl COMPARISON:  04/12/2016 FINDINGS: NECK No hypermetabolic lymph nodes in the neck. Previously seen right level 2A hypermetabolic lymph node has resolved since previous study. CHEST No hypermetabolic mediastinal or hilar nodes. No suspicious pulmonary nodules on the CT scan. ABDOMEN/PELVIS No abnormal hypermetabolic activity within the liver, pancreas, adrenal glands, or spleen. No hypermetabolic lymph nodes in the abdomen or pelvis. SKELETON No focal hypermetabolic activity to suggest skeletal metastasis. IMPRESSION: Interval resolution of hypermetabolic right cervical level 2A lymph node. No evidence of metabolically active lymphoma. Electronically Signed   By: Earle Gell M.D.   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 I also printed to current guidelines. 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 PET CT scan showed complete response. We discussed the role of adjuvant radiation treatment. I recommend consultation with radiation oncologist but ultimately she declined. I will continue close observation with history, physical examination and blood work every 3 months and imaging  study in 6 months. We also discussed the role of maintenance rituximab but she declined for now  Tobacco abuse The patient managed to cut down on cigarette smoking to 3 cigarettes per day. I congratulated her effort. I recommend a stepdown on her prescription nicotine patch   Hypertension She has intermittent high blood pressure. I suspect it is an element of white coat hypertension. She will continue close follow-up with her primary care doctor for medication adjustment  Gastroesophageal reflux disease She has significant reflux disease. I recommend Mylanta along with her proton pump inhibitor   No orders of the defined types were placed in this encounter.  All questions were answered. The patient knows to call the clinic with any problems, questions or concerns. No barriers to learning was detected. I spent 20 minutes counseling the patient face to face. The total time spent in the appointment was 30 minutes and more than 50% was on counseling and review of test results  Heath Lark, MD 07/22/2016 7:12 AM

## 2016-08-16 ENCOUNTER — Encounter: Payer: Self-pay | Admitting: *Deleted

## 2016-08-16 ENCOUNTER — Telehealth: Payer: Self-pay | Admitting: *Deleted

## 2016-08-16 NOTE — Telephone Encounter (Signed)
Patient called stating that she needs a note to "return back to work". This note can be faxed to [phone] to the attn: [employer]

## 2016-08-16 NOTE — Telephone Encounter (Signed)
No restrictions needed. Returns to work tomorrow 3/6

## 2016-08-16 NOTE — Telephone Encounter (Signed)
Can you ask if she wants any restrictions or not?

## 2016-08-23 DIAGNOSIS — F17200 Nicotine dependence, unspecified, uncomplicated: Secondary | ICD-10-CM | POA: Diagnosis not present

## 2016-08-23 DIAGNOSIS — M859 Disorder of bone density and structure, unspecified: Secondary | ICD-10-CM | POA: Diagnosis not present

## 2016-08-23 DIAGNOSIS — R8299 Other abnormal findings in urine: Secondary | ICD-10-CM | POA: Diagnosis not present

## 2016-08-23 DIAGNOSIS — Z6829 Body mass index (BMI) 29.0-29.9, adult: Secondary | ICD-10-CM | POA: Diagnosis not present

## 2016-08-23 DIAGNOSIS — L0292 Furuncle, unspecified: Secondary | ICD-10-CM | POA: Diagnosis not present

## 2016-08-23 DIAGNOSIS — Z0000 Encounter for general adult medical examination without abnormal findings: Secondary | ICD-10-CM | POA: Diagnosis not present

## 2016-08-24 ENCOUNTER — Telehealth: Payer: Self-pay

## 2016-08-24 MED ORDER — MIRTAZAPINE 15 MG PO TABS: 15.0000 mg | ORAL_TABLET | Freq: Every day | ORAL | 1 refills | Status: DC

## 2016-08-24 NOTE — Telephone Encounter (Signed)
Incoming fax for mirtazapine refill

## 2016-08-27 DIAGNOSIS — Z1389 Encounter for screening for other disorder: Secondary | ICD-10-CM | POA: Diagnosis not present

## 2016-08-27 DIAGNOSIS — C760 Malignant neoplasm of head, face and neck: Secondary | ICD-10-CM | POA: Diagnosis not present

## 2016-08-27 DIAGNOSIS — I10 Essential (primary) hypertension: Secondary | ICD-10-CM | POA: Diagnosis not present

## 2016-08-27 DIAGNOSIS — M25511 Pain in right shoulder: Secondary | ICD-10-CM | POA: Diagnosis not present

## 2016-08-27 DIAGNOSIS — Z6829 Body mass index (BMI) 29.0-29.9, adult: Secondary | ICD-10-CM | POA: Diagnosis not present

## 2016-08-27 DIAGNOSIS — Z0000 Encounter for general adult medical examination without abnormal findings: Secondary | ICD-10-CM | POA: Diagnosis not present

## 2016-08-27 DIAGNOSIS — E785 Hyperlipidemia, unspecified: Secondary | ICD-10-CM | POA: Diagnosis not present

## 2016-09-14 DIAGNOSIS — R635 Abnormal weight gain: Secondary | ICD-10-CM | POA: Diagnosis not present

## 2016-09-14 DIAGNOSIS — N951 Menopausal and female climacteric states: Secondary | ICD-10-CM | POA: Diagnosis not present

## 2016-10-06 ENCOUNTER — Ambulatory Visit (INDEPENDENT_AMBULATORY_CARE_PROVIDER_SITE_OTHER): Payer: Self-pay | Admitting: Orthopedic Surgery

## 2016-10-07 ENCOUNTER — Telehealth: Payer: Self-pay | Admitting: *Deleted

## 2016-10-07 DIAGNOSIS — R140 Abdominal distension (gaseous): Secondary | ICD-10-CM | POA: Diagnosis not present

## 2016-10-07 DIAGNOSIS — R0602 Shortness of breath: Secondary | ICD-10-CM | POA: Diagnosis not present

## 2016-10-07 DIAGNOSIS — R0789 Other chest pain: Secondary | ICD-10-CM | POA: Diagnosis not present

## 2016-10-07 DIAGNOSIS — I10 Essential (primary) hypertension: Secondary | ICD-10-CM | POA: Diagnosis not present

## 2016-10-07 DIAGNOSIS — R635 Abnormal weight gain: Secondary | ICD-10-CM | POA: Diagnosis not present

## 2016-10-07 NOTE — Telephone Encounter (Signed)
Call placed back to patient and patient notified per order of Dr. Alvy Bimler that there are no openings prior to her scheduled visit on 10/14/16 and that Dr. Alvy Bimler recommends that she go to see her PCP or an urgent care today.  Patient appreciative of call back and states that she will contact her PCP today for an office visit.

## 2016-10-07 NOTE — Telephone Encounter (Signed)
I have no opening, overbooked daily, unlikely able to work her in She sounds quite sick; I recommend evaluation by PCP or urgent care

## 2016-10-07 NOTE — Telephone Encounter (Signed)
Called patient and given below message. Verbalized understanding.

## 2016-10-07 NOTE — Telephone Encounter (Signed)
Call received from patient stating that she would like an earlier appt than scheduled appt on 10/14/16 to see Dr. Alvy Bimler.  Patient states that she is having increased bloating with eating, decreased energy and appetite, wheezing and SOB with ambulation, lightheaded and dizzy at times, nausea and denies any fevers, diarrhea or constipation.  Will forward message to Dr. Alvy Bimler desk.

## 2016-10-10 ENCOUNTER — Encounter (HOSPITAL_COMMUNITY): Payer: Self-pay | Admitting: Emergency Medicine

## 2016-10-10 ENCOUNTER — Emergency Department (HOSPITAL_COMMUNITY): Payer: BLUE CROSS/BLUE SHIELD

## 2016-10-10 ENCOUNTER — Emergency Department (HOSPITAL_COMMUNITY)
Admission: EM | Admit: 2016-10-10 | Discharge: 2016-10-10 | Disposition: A | Discharge status: Home / Self Care | Payer: BLUE CROSS/BLUE SHIELD | Attending: Emergency Medicine | Admitting: Emergency Medicine

## 2016-10-10 DIAGNOSIS — M4802 Spinal stenosis, cervical region: Secondary | ICD-10-CM | POA: Diagnosis not present

## 2016-10-10 DIAGNOSIS — I10 Essential (primary) hypertension: Secondary | ICD-10-CM | POA: Insufficient documentation

## 2016-10-10 DIAGNOSIS — F17210 Nicotine dependence, cigarettes, uncomplicated: Secondary | ICD-10-CM | POA: Insufficient documentation

## 2016-10-10 DIAGNOSIS — M79621 Pain in right upper arm: Secondary | ICD-10-CM | POA: Diagnosis not present

## 2016-10-10 DIAGNOSIS — M79601 Pain in right arm: Secondary | ICD-10-CM

## 2016-10-10 DIAGNOSIS — R0602 Shortness of breath: Secondary | ICD-10-CM | POA: Diagnosis not present

## 2016-10-10 DIAGNOSIS — E871 Hypo-osmolality and hyponatremia: Secondary | ICD-10-CM

## 2016-10-10 LAB — CBC WITH DIFFERENTIAL/PLATELET
Basophils Absolute: 0 10*3/uL (ref 0.0–0.1)
Basophils Relative: 0 %
Eosinophils Absolute: 0.1 10*3/uL (ref 0.0–0.7)
Eosinophils Relative: 1 %
HCT: 36.6 % (ref 36.0–46.0)
Hemoglobin: 13 g/dL (ref 12.0–15.0)
Lymphocytes Relative: 18 %
Lymphs Abs: 1.1 10*3/uL (ref 0.7–4.0)
MCH: 31.7 pg (ref 26.0–34.0)
MCHC: 35.5 g/dL (ref 30.0–36.0)
MCV: 89.3 fL (ref 78.0–100.0)
Monocytes Absolute: 0.8 10*3/uL (ref 0.1–1.0)
Monocytes Relative: 13 %
Neutro Abs: 4.4 10*3/uL (ref 1.7–7.7)
Neutrophils Relative %: 68 %
Platelets: 185 10*3/uL (ref 150–400)
RBC: 4.1 MIL/uL (ref 3.87–5.11)
RDW: 13.6 % (ref 11.5–15.5)
WBC: 6.4 10*3/uL (ref 4.0–10.5)

## 2016-10-10 LAB — HEPATIC FUNCTION PANEL
ALT: 20 U/L (ref 14–54)
AST: 21 U/L (ref 15–41)
Albumin: 4 g/dL (ref 3.5–5.0)
Alkaline Phosphatase: 82 U/L (ref 38–126)
Bilirubin, Direct: 0.1 mg/dL (ref 0.1–0.5)
Indirect Bilirubin: 0.6 mg/dL (ref 0.3–0.9)
Total Bilirubin: 0.7 mg/dL (ref 0.3–1.2)
Total Protein: 6.7 g/dL (ref 6.5–8.1)

## 2016-10-10 LAB — BASIC METABOLIC PANEL
Anion gap: 11 (ref 5–15)
BUN: 14 mg/dL (ref 6–20)
CO2: 24 mmol/L (ref 22–32)
Calcium: 8.9 mg/dL (ref 8.9–10.3)
Chloride: 92 mmol/L — ABNORMAL LOW (ref 101–111)
Creatinine, Ser: 0.67 mg/dL (ref 0.44–1.00)
GFR calc Af Amer: >60 mL/min (ref >60)
GFR calc non Af Amer: >60 mL/min (ref >60)
Glucose, Bld: 92 mg/dL (ref 65–99)
Potassium: 3.5 mmol/L (ref 3.5–5.1)
Sodium: 127 mmol/L — ABNORMAL LOW (ref 135–145)

## 2016-10-10 LAB — URINALYSIS, ROUTINE W REFLEX MICROSCOPIC
Bacteria, UA: NONE SEEN
Bilirubin Urine: NEGATIVE
Glucose, UA: NEGATIVE mg/dL
Ketones, ur: 5 mg/dL — AB
Leukocytes, UA: NEGATIVE
Nitrite: NEGATIVE
Protein, ur: NEGATIVE mg/dL
Specific Gravity, Urine: 1.008 (ref 1.005–1.030)
pH: 6 (ref 5.0–8.0)

## 2016-10-10 LAB — I-STAT TROPONIN, ED: Troponin i, poc: 0 ng/mL (ref 0.00–0.08)

## 2016-10-10 LAB — D-DIMER, QUANTITATIVE: D-Dimer, Quant: 0.32 ug/mL-FEU (ref 0.00–0.50)

## 2016-10-10 LAB — LIPASE, BLOOD: Lipase: 21 U/L (ref 11–51)

## 2016-10-10 MED ORDER — LORAZEPAM 1 MG PO TABS
1.0000 mg | ORAL_TABLET | Freq: Once | ORAL | Status: AC | PRN
  Administered 2016-10-10: 1 mg via ORAL
  Filled 2016-10-10: qty 1

## 2016-10-10 MED ORDER — SODIUM CHLORIDE 0.9 % IV BOLUS (SEPSIS): 500.0000 mL | Freq: Once | INTRAVENOUS | Status: DC

## 2016-10-10 MED ORDER — KETOROLAC TROMETHAMINE 60 MG/2ML IM SOLN
60.0000 mg | Freq: Once | INTRAMUSCULAR | Status: AC
  Administered 2016-10-10: 60 mg via INTRAMUSCULAR
  Filled 2016-10-10: qty 2

## 2016-10-10 NOTE — ED Notes (Signed)
PT unable to provide urine sample at this time. PT recently use restroom before coming to room

## 2016-10-10 NOTE — ED Provider Notes (Signed)
[facility] DEPT Provider Note   CSN: [id number] Arrival date & time: 10/10/16  1439     History   Chief Complaint Chief Complaint  Patient presents with  . multiple complaints    HPI Emily Santiago is a 56 y.o. female.  The history is provided by the patient. No language interpreter was used.   Emily Santiago is a 56 y.o. female who presents to the Emergency Department complaining of multiple complaints.  She presents for evaluation of shortness of breath, right arm pain and numbness as well as abdominal bloating. Over the last week she has experienced progressive symptoms of abdominal bloating, nausea, shortness of breath with chest heaviness. She has significant dyspnea on exertion. She also endorses numb and pain sensation to the entire right upper extremity. She feels weak in the arm. The pain radiates up to her neck. She last few days has experienced increased heaviness and discomfort in her left upper extremity. For the last 24 hours she has had difficulty urinating with feeling like she needs to urinate but unable to empty her bladder and a pressure type sensation when she tries to void. No dysuria, fevers, vomiting, diarrhea. She saw her PCP for similar symptoms 2 days ago and was started on ondansetron with minimal improvement in her symptoms. She has a history of lymphoma and completed treatment in January. She is scheduled for repeat assessment on Thursday of this week. No history of DVT or PE and she takes no blood thinners. Past Medical History:  Diagnosis Date  . Anxiety   . Hypertension   . Lymphoma ([place])   . Varicose veins     Patient Active Problem List   Diagnosis Date Noted  . Dysuria 07/05/2016  . Bladder infection 07/05/2016  . Drug-induced neutropenia ([place]) 07/05/2016  . Nausea without vomiting 06/29/2016  . Other constipation 06/22/2016  . Dehydration 06/08/2016  . Mucositis due to antineoplastic therapy 06/08/2016  . Hypotension 06/03/2016  . Bone pain  due to G-CSF 06/02/2016  . Sore throat 05/04/2016  . Dysphagia 04/22/2016  . Gastroesophageal reflux disease 04/22/2016  . 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 Incisional pain 04/21/2016  . Hypertension 04/21/2016  . Tobacco abuse 04/20/2016  . Pain of left lower extremity-Thigh / Leg 02/05/2014  . Varicose veins of lower extremities with other complications 63/89/3734    Past Surgical History:  Procedure Laterality Date  . ABDOMINAL HYSTERECTOMY    . CHOLECYSTECTOMY    . ESOPHAGEAL MANOMETRY N/A 04/28/2016   Procedure: ESOPHAGEAL MANOMETRY (EM);  Surgeon: Mauri Pole, MD;  Location: [facility] END[facility]COPY;  Service: Endoscopy;  Laterality: N/A;  dr. Loletha Carrow  . LYMPH NODE BIOPSY    . WRIST SURGERY Right     OB History    No data available       Home Medications    Prior to Admission medications   Medication Sig Start Date End Date Taking? Authorizing Provider  B Complex Vitamins (B-COMPLEX/B-12) TABS Take 1 tablet by mouth daily.   Yes Historical Provider, MD  BIOTIN 5000 PO Take 1 capsule by mouth daily.   Yes Historical Provider, MD  Cholecalciferol (VITAMIN D3) 5000 units CAPS Take 5,000 Units by mouth daily.   Yes Historical Provider, MD  escitalopram (LEXAPRO) 10 MG tablet Take 10 mg by mouth daily.  02/25/16  Yes Historical Provider, MD  esomeprazole (NEXIUM) 40 MG capsule Take 1 capsule (40 mg total) by mouth daily. 06/15/16  Yes Heath Lark, MD  methocarbamol (ROBAXIN) 500 MG tablet Take 500 mg by mouth daily as needed for pain. 08/27/16  Yes Historical Provider, MD  mirtazapine (REMERON) 15 MG tablet Take 1 tablet (15 mg total) by mouth at bedtime. 08/24/16  Yes Heath Lark, MD  Multiple Vitamin (MULTIVITAMIN WITH MINERALS) TABS tablet Take 1 tablet by mouth daily.   Yes Historical Provider, MD  nicotine (NICODERM CQ - DOSED IN MG/24 HOURS) 14 mg/24hr patch Place 1 patch (14 mg total) onto the skin daily. 06/01/16  Yes Heath Lark, MD  ondansetron (ZOFRAN) 8 MG tablet Take 1 tablet (8 mg total) by mouth every 8 (eight) hours as needed for refractory nausea / vomiting. 05/04/16  Yes Heath Lark, MD  oxyCODONE (OXY IR/ROXICODONE) 5 MG immediate release tablet Take 1 tablet (5 mg total) by mouth every 4 (four) hours as needed for severe pain. 07/20/16  Yes Heath Lark, MD  polyethylene glycol (MIRALAX / GLYCOLAX) packet Take 17 g by mouth daily.   Yes Historical Provider, MD  prochlorperazine (COMPAZINE) 10 MG tablet Take 1 tablet (10 mg total) by mouth every 6 (six) hours as needed (Nausea or vomiting). 05/04/16  Yes Heath Lark, MD  rOPINIRole (REQUIP) 0.5 MG tablet Take 0.5 mg by mouth daily. 09/23/16  Yes Historical Provider, MD  valsartan-hydrochlorothiazide (DIOVAN-HCT) 160-25 MG tablet Take 1/2 tab PO Q AM. 06/08/16  Yes Susanne Borders, NP  VITAMIN A PO Take 1 tablet by mouth daily. Unsure of strength   Yes Historical Provider, MD  magic mouthwash w/lidocaine SOLN Take 5 mLs by mouth 4 (four) times daily. Patient not taking: Reported on 10/10/2016 06/04/16   Susanne Borders, NP    Family History Family History  Problem Relation Age of Onset  . Heart disease Father   . Colon cancer Father     dx in his 53's  . Heart disease Maternal Grandmother   . Prostate cancer Maternal Uncle   . Brain cancer Brother   . Liver cancer Brother   . Bone cancer Brother   . Prostate cancer Brother     Social History Social History  Substance Use Topics  . Smoking status: Current Every Day Smoker    Packs/day: 0.50    Years: 35.00    Types: Cigarettes  . Smokeless tobacco: Never Used     Comment: form given 04-22-16  . Alcohol use No     Allergies   Lisinopril; Penicillins; and Prednisone   Review of Systems Review of Systems  All other systems reviewed and are negative.    Physical Exam Updated Vital Signs BP (!) 149/94   Pulse 79   Temp 97.7 F (36.5 C) (Oral)   Resp 18   Ht '5\' 1"'$  (1.549 m)   Wt 154  lb (69.9 kg)   SpO2 96%   BMI 29.10 kg/m   Physical Exam  Constitutional: She is oriented to person, place, and time. She appears well-developed and well-nourished.  HENT:  Head: Normocephalic and atraumatic.  Cardiovascular: Normal rate and regular rhythm.   No murmur heard. Pulmonary/Chest: Effort normal and breath sounds normal. No respiratory distress.  Abdominal: Soft. There is no rebound and no guarding.  Mild diffuse abdominal tenderness  Musculoskeletal: She exhibits no edema or tenderness.  2+ radial pulses bilaterally  Neurological: She is alert and oriented to person, place, and time.  Sensation to light touch intact in all 4 extremities. 4 out of 5 strength in the right hand and  right upper extremity. 5 out of 5 strength in the left upper extremity and bilateral lower extremities. Sensation to light touch intact in all 4 extremities.  Skin: Skin is warm and dry.  Psychiatric: She has a normal mood and affect. Her behavior is normal.  Nursing note and vitals reviewed.    ED Treatments / Results  Labs (all labs ordered are listed, but only abnormal results are displayed) Labs Reviewed  BASIC METABOLIC PANEL - Abnormal; Notable for the following:       Result Value   Sodium 127 (*)    Chloride 92 (*)    All other components within normal limits  URINALYSIS, ROUTINE W REFLEX MICROSCOPIC - Abnormal; Notable for the following:    Hgb urine dipstick SMALL (*)    Ketones, ur 5 (*)    Squamous Epithelial / LPF 0-5 (*)    All other components within normal limits  CBC WITH DIFFERENTIAL/PLATELET  D-DIMER, QUANTITATIVE (NOT AT [facility])  HEPATIC FUNCTION PANEL  LIPASE, BLOOD  I-STAT TROPOININ, ED    EKG  EKG Interpretation  Date/Time:  Sunday October 10 2016 15:17:58 EDT Ventricular Rate:  83 PR Interval:    QRS Duration: 102 QT Interval:  390 QTC Calculation: 459 R Axis:   67 Text Interpretation:  Sinus rhythm Borderline T abnormalities, anterior leads Confirmed by  Hazle Coca [phone]) on 10/10/2016 3:29:27 PM       Radiology Dg Chest 2 View  Result Date: 10/10/2016 CLINICAL DATA:  Shortness of breath and arm pain, initial encounter EXAM: CHEST  2 VIEW COMPARISON:  07/13/2016 FINDINGS: The heart size and mediastinal contours are within normal limits. Both lungs are clear. The visualized skeletal structures are unremarkable. IMPRESSION: No active cardiopulmonary disease. Electronically Signed   By: Inez Catalina M.D.   On: 10/10/2016 18:03   Mr Cervical Spine Wo Contrast  Result Date: 10/10/2016 CLINICAL DATA:  Tingling sensation in both arms. Difficulty urinating. Recent diagnosis and treatment of lymphoma. EXAM: MRI CERVICAL SPINE WITHOUT CONTRAST TECHNIQUE: Multiplanar, multisequence MR imaging of the cervical spine was performed. No intravenous contrast was administered. COMPARISON:  CT 03/08/2016 FINDINGS: Alignment: Normal Vertebrae: No primary bone lesion. Some discogenic marrow changes at C4-5. Cord: No primary cord lesion. Posterior Fossa, vertebral arteries, paraspinal tissues: Negative Disc levels: No abnormality at foramen magnum, C1-2 or C2-3. C3-4:  Minimal uncovertebral hypertrophy.  No significant stenosis. C4-5: Spondylosis with endplate osteophytes covering bulging disc material. Narrowing of the ventral subarachnoid space but no compression of the cord. AP diameter of the canal 8 mm. Bilateral foraminal encroachment that could compress either or both C5 nerve roots. C5-6: Spondylosis with endplate osteophytes and bulging of the disc. Narrowing of the ventral subarachnoid space but no compression of the cord. AP diameter of the canal 9 mm. Foraminal encroachment bilaterally that could compress either or both C6 nerve roots. C6-7:  Normal interspace. C7-T1:  Normal interspace. IMPRESSION: No evidence of lymphoma involvement of the spine. Degenerative spondylosis at C4-5 and C5-6. Canal narrowing but no frank cord compression. Bilateral foraminal stenosis  that could compress either or both C5 and C6 nerve roots. Electronically Signed   By: Nelson Chimes M.D.   On: 10/10/2016 17:04    Procedures Procedures (including critical care time)  Medications Ordered in ED Medications  LORazepam (ATIVAN) tablet 1 mg (1 mg Oral Given 10/10/16 1608)  ketorolac (TORADOL) injection 60 mg (60 mg Intramuscular Given 10/10/16 1904)     Initial Impression / Assessment and Plan /  ED Course  I have reviewed the triage vital signs and the nursing notes.  Pertinent labs & imaging results that were available during my care of the patient were reviewed by me and considered in my medical decision making (see chart for details).     Patient here for evaluation of Toradol for pain, progressive shortness of breath, right arm pain and weakness. MRI does not show an acute source of her right upper extremity weakness and pain, no evidence of mass or spinal cord impingement. Presentation is not consistent with PE, ACS, CHF, pneumonia, dissection. Counseled patient on home care for right arm pain, malaise. Discussed outpatient follow-up and return precautions.  Final Clinical Impressions(s) / ED Diagnoses   Final diagnoses:  Pain of right upper extremity  Hyponatremia  Shortness of breath    New Prescriptions Discharge Medication List as of 10/10/2016  6:54 PM       Quintella Reichert, MD 10/10/16 2336

## 2016-10-10 NOTE — ED Notes (Signed)
Dr Ralene Bathe in to see pt, pt with complaints of multiple things, will presue with test, hopefully MRI

## 2016-10-10 NOTE — ED Notes (Addendum)
Pt from home with complaints of  Pins and needles in both arms. Pt also has complaints of dry mouth, high blood pressure, difficulty urinating,  Generalized body aches, and abdominal swelling that she states makes it feel hard to take a deep breath. Pt was seen at her PCP on Friday who told her she has "swelling in her kidneys". Pt has been taking zofran for nausea. Pt was recently treated for lymphedema. Pt is maintaining her oxygen saturation at 99% and is in no acute distress.

## 2016-10-10 NOTE — ED Notes (Signed)
Pt taken to MRI  

## 2016-10-12 DIAGNOSIS — J449 Chronic obstructive pulmonary disease, unspecified: Secondary | ICD-10-CM | POA: Diagnosis not present

## 2016-10-12 DIAGNOSIS — R140 Abdominal distension (gaseous): Secondary | ICD-10-CM | POA: Diagnosis not present

## 2016-10-12 DIAGNOSIS — C8591 Non-Hodgkin lymphoma, unspecified, lymph nodes of head, face, and neck: Secondary | ICD-10-CM | POA: Diagnosis not present

## 2016-10-12 DIAGNOSIS — Z6829 Body mass index (BMI) 29.0-29.9, adult: Secondary | ICD-10-CM | POA: Diagnosis not present

## 2016-10-14 ENCOUNTER — Ambulatory Visit (HOSPITAL_BASED_OUTPATIENT_CLINIC_OR_DEPARTMENT_OTHER): Payer: BLUE CROSS/BLUE SHIELD | Admitting: Hematology and Oncology

## 2016-10-14 ENCOUNTER — Other Ambulatory Visit (HOSPITAL_BASED_OUTPATIENT_CLINIC_OR_DEPARTMENT_OTHER): Payer: BLUE CROSS/BLUE SHIELD

## 2016-10-14 ENCOUNTER — Telehealth: Payer: Self-pay

## 2016-10-14 ENCOUNTER — Telehealth: Payer: Self-pay | Admitting: Hematology and Oncology

## 2016-10-14 ENCOUNTER — Ambulatory Visit: Payer: BLUE CROSS/BLUE SHIELD

## 2016-10-14 VITALS — BP 124/75 | HR 78 | Temp 98.5°F | Resp 17 | Ht 61.0 in | Wt 155.6 lb

## 2016-10-14 DIAGNOSIS — C8231 Follicular lymphoma grade IIIa, lymph nodes of head, face, and neck: Secondary | ICD-10-CM | POA: Diagnosis not present

## 2016-10-14 DIAGNOSIS — Z720 Tobacco use: Secondary | ICD-10-CM

## 2016-10-14 DIAGNOSIS — E871 Hypo-osmolality and hyponatremia: Secondary | ICD-10-CM

## 2016-10-14 DIAGNOSIS — M47812 Spondylosis without myelopathy or radiculopathy, cervical region: Secondary | ICD-10-CM | POA: Diagnosis not present

## 2016-10-14 LAB — COMPREHENSIVE METABOLIC PANEL
ALT: 19 U/L (ref 0–55)
AST: 20 U/L (ref 5–34)
Albumin: 4.4 g/dL (ref 3.5–5.0)
Alkaline Phosphatase: 104 U/L (ref 40–150)
Anion Gap: 7 mEq/L (ref 3–11)
BUN: 12.2 mg/dL (ref 7.0–26.0)
CO2: 28 mEq/L (ref 22–29)
Calcium: 10.1 mg/dL (ref 8.4–10.4)
Chloride: 94 mEq/L — ABNORMAL LOW (ref 98–109)
Creatinine: 0.7 mg/dL (ref 0.6–1.1)
EGFR: >90 mL/min/{1.73_m2} (ref >90)
Glucose: 90 mg/dl (ref 70–140)
Potassium: 4.9 mEq/L (ref 3.5–5.1)
Sodium: 130 mEq/L — ABNORMAL LOW (ref 136–145)
Total Bilirubin: 0.34 mg/dL (ref 0.20–1.20)
Total Protein: 7.9 g/dL (ref 6.4–8.3)

## 2016-10-14 LAB — CBC WITH DIFFERENTIAL/PLATELET
BASO%: 0.4 % (ref 0.0–2.0)
Basophils Absolute: 0 10*3/uL (ref 0.0–0.1)
EOS%: 1.8 % (ref 0.0–7.0)
Eosinophils Absolute: 0.1 10*3/uL (ref 0.0–0.5)
HCT: 44.3 % (ref 34.8–46.6)
HGB: 15.2 g/dL (ref 11.6–15.9)
LYMPH%: 20 % (ref 14.0–49.7)
MCH: 31.4 pg (ref 25.1–34.0)
MCHC: 34.3 g/dL (ref 31.5–36.0)
MCV: 91.4 fL (ref 79.5–101.0)
MONO#: 0.5 10*3/uL (ref 0.1–0.9)
MONO%: 8.2 % (ref 0.0–14.0)
NEUT#: 4.4 10*3/uL (ref 1.5–6.5)
NEUT%: 69.6 % (ref 38.4–76.8)
Platelets: 247 10*3/uL (ref 145–400)
RBC: 4.85 10*6/uL (ref 3.70–5.45)
RDW: 14 % (ref 11.2–14.5)
WBC: 6.4 10*3/uL (ref 3.9–10.3)
lymph#: 1.3 10*3/uL (ref 0.9–3.3)

## 2016-10-14 NOTE — Telephone Encounter (Signed)
Patient given below message, verbalized understanding.

## 2016-10-14 NOTE — Telephone Encounter (Signed)
-----   Message from Heath Lark, MD sent at 10/14/2016 11:52 AM EDT ----- Regarding: labs Please tell her/husband sodium is better I do not feel strongly any of her medications need to be changed She can discuss with her PCP if she wants her BP medication adjusted ----- Message ----- From: Interface, Lab In Three Zero One Sent: 10/14/2016  11:25 AM To: Heath Lark, MD

## 2016-10-14 NOTE — Telephone Encounter (Signed)
Gave patient AVS and calender per 5/3 los - per Dr. Alvy Bimler patient need to have lab work drawn - Lab Add on appt made.

## 2016-10-15 ENCOUNTER — Encounter: Payer: Self-pay | Admitting: Hematology and Oncology

## 2016-10-15 DIAGNOSIS — M47812 Spondylosis without myelopathy or radiculopathy, cervical region: Secondary | ICD-10-CM | POA: Insufficient documentation

## 2016-10-15 DIAGNOSIS — E871 Hypo-osmolality and hyponatremia: Secondary | ICD-10-CM | POA: Insufficient documentation

## 2016-10-15 NOTE — Assessment & Plan Note (Signed)
She has recent hyponatremia, improved compared to blood work done from the emergency department This is likely caused by her medications She complained of dizziness I recommend she consult with her primary care doctor regarding her blood pressure medications and to consider medication adjustment if needed

## 2016-10-15 NOTE — Assessment & Plan Note (Signed)
Examination is benign She does not need surveillance routine imaging study I will see her back in a few months for further follow-up 

## 2016-10-15 NOTE — Progress Notes (Signed)
[facility] OFFICE PROGRESS NOTE  Patient Care Team: Velna Hatchet, MD as PCP - General (Internal Medicine) Leta Baptist, MD as Consulting Physician (Otolaryngology)  SUMMARY OF ONCOLOGIC HISTORY: Oncology History   FLIPI score 0 (low risk)     Follicular lymphoma grade iiia, lymph nodes of head, face, and neck ([place])   03/08/2016 Imaging    Ct neck showed right level IIa lymphadenopathy at site of palpable interest. Additionally, there are prominent right upper cervical and left supraclavicular lymph nodes. This may represent nodal metastasis or be related to a systemic inflammatory or lymphoproliferative process. No infectious/and process in the neck identified. Effacement of the right vallecula an asymmetric thickening of the right aryepiglottic fold, direct visualization is recommended to evaluate for mucosal lesion.      03/19/2016 Pathology Results    Accession: [id number] Histologic type: Follicular lymphoma, large cell type. Grade (if applicable): High grade, 3 A. Flow cytometry: B cell population with kappa light chain excess ([id number]) Immunohistochemical stains: CD20, CD3, CD10, BCL-2, BCL-6 and Ki-67 with appropriate controls. Touch preps/imprints: A mixture of small and large lymphoid cells. Comments: The sections show effacement of the lymph nodal architecture by numerous variably sized and ill defined atypical lymphoid follicles characterized by lack of polarity, attenuated or absent mantle zones and relatively homogeneous composition of small and large transformed and cleaved lymphoid cells. The number of large lymphoid cells varies in different follicles but generally average more than 15 cells / high power field . This is associated with scattered mitosis. No diffuse component is identified. To further evaluate this process, flow cytometric analysis was performed and shows a B cell population displaying expression of pan B cell antigens including CD20 associated  with CD10 and kappa light chain excess. Immunohistochemical stains were performed and show that the lymphoid follicles are positive for CD20, CD10, BCL-6 and BCL-2. Ki 67 shows variable increased expression ranging for 10% to >50% in many of the follicles. The atypical lymphoid follicles are surrounded by an abundant population of T cells as primarily seen with CD3. 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 (BNS:kh 03-23-16)      03/19/2016 Procedure    Direct laryngoscopy was negative for abnormalities. Biopsies are taken from right neck LN      04/12/2016 PET scan    PET scan showed abnormal right station 2A lymph node measuring 1.1 cm in length with maximum SUV of 6.1, Deauville scale 4. There is some symmetric and probably physiologic activity along the lingual tonsils. Spleen normal, and no other adenopathy identified      05/11/2016 - 06/22/2016 Chemotherapy    She received R-CHOP chemo x 3      07/13/2016 PET scan    Interval resolution of hypermetabolic right cervical level 2A lymph node. No evidence of metabolically active lymphoma.       INTERVAL HISTORY: Please see below for problem oriented charting. She returns for follow-up. She is seen with her husband present She denies new lymphadenopathy Denies recent infection She complain of excessive fatigue She started smoking again She went to the emergency department recently with multiple complaints and was found to have hyponatremia  REVIEW OF SYSTEMS:   Constitutional: Denies fevers, chills or abnormal weight loss Eyes: Denies blurriness of vision Ears, nose, mouth, throat, and face: Denies mucositis or sore throat Respiratory: Denies cough, dyspnea or wheezes Cardiovascular: Denies palpitation, chest discomfort or lower extremity swelling Gastrointestinal:  Denies nausea, heartburn or change in bowel habits Skin: Denies  abnormal skin rashes Lymphatics: Denies new lymphadenopathy or easy  bruising Neurological:Denies numbness, tingling or new weaknesses Behavioral/Psych: Mood is stable, no new changes  All other systems were reviewed with the patient and are negative.  I have reviewed the past medical history, past surgical history, social history and family history with the patient and they are unchanged from previous note.  ALLERGIES:  is allergic to lisinopril; penicillins; and prednisone.  MEDICATIONS:  Current Outpatient Prescriptions  Medication Sig Dispense Refill  . B Complex Vitamins (B-COMPLEX/B-12) TABS Take 1 tablet by mouth daily.    Marland Kitchen BIOTIN 5000 PO Take 1 capsule by mouth daily.    . Cholecalciferol (VITAMIN D3) 5000 units CAPS Take 5,000 Units by mouth daily.    Marland Kitchen escitalopram (LEXAPRO) 10 MG tablet Take 10 mg by mouth daily.   2  . esomeprazole (NEXIUM) 40 MG capsule Take 1 capsule (40 mg total) by mouth daily. 90 capsule 3  . methocarbamol (ROBAXIN) 500 MG tablet Take 500 mg by mouth daily as needed for pain.  0  . mirtazapine (REMERON) 15 MG tablet Take 1 tablet (15 mg total) by mouth at bedtime. 30 tablet 1  . Multiple Vitamin (MULTIVITAMIN WITH MINERALS) TABS tablet Take 1 tablet by mouth daily.    . polyethylene glycol (MIRALAX / GLYCOLAX) packet Take 17 g by mouth daily.    . valsartan-hydrochlorothiazide (DIOVAN-HCT) 160-25 MG tablet Take 1/2 tab PO Q AM. 15 tablet 4  . VITAMIN A PO Take 1 tablet by mouth daily. Unsure of strength    . magic mouthwash w/lidocaine SOLN Take 5 mLs by mouth 4 (four) times daily. (Patient not taking: Reported on 10/10/2016) 240 mL 1  . nicotine (NICODERM CQ - DOSED IN MG/24 HOURS) 14 mg/24hr patch Place 1 patch (14 mg total) onto the skin daily. (Patient not taking: Reported on 10/14/2016) 21 patch 0  . ondansetron (ZOFRAN) 8 MG tablet Take 1 tablet (8 mg total) by mouth every 8 (eight) hours as needed for refractory nausea / vomiting. (Patient not taking: Reported on 10/14/2016) 30 tablet 1  . oxyCODONE (OXY IR/ROXICODONE) 5  MG immediate release tablet Take 1 tablet (5 mg total) by mouth every 4 (four) hours as needed for severe pain. (Patient not taking: Reported on 10/14/2016) 90 tablet 0  . prochlorperazine (COMPAZINE) 10 MG tablet Take 1 tablet (10 mg total) by mouth every 6 (six) hours as needed (Nausea or vomiting). (Patient not taking: Reported on 10/14/2016) 30 tablet 6  . rOPINIRole (REQUIP) 0.5 MG tablet Take 0.5 mg by mouth daily.  2   No current facility-administered medications for this visit.     PHYSICAL EXAMINATION: ECOG PERFORMANCE STATUS: 1 - Symptomatic but completely ambulatory  Vitals:   10/14/16 1005  BP: 124/75  Pulse: 78  Resp: 17  Temp: 98.5 F (36.9 C)   Filed Weights   10/14/16 1005  Weight: 155 lb 9.6 oz (70.6 kg)    GENERAL:alert, no distress and comfortable SKIN: skin color, texture, turgor are normal, no rashes or significant lesions EYES: normal, Conjunctiva are pink and non-injected, sclera clear OROPHARYNX:no exudate, no erythema and lips, buccal mucosa, and tongue normal  NECK: supple, thyroid normal size, non-tender, without nodularity LYMPH:  no palpable lymphadenopathy in the cervical, axillary or inguinal LUNGS: clear to auscultation and percussion with normal breathing effort HEART: regular rate & rhythm and no murmurs and no lower extremity edema ABDOMEN:abdomen soft, non-tender and normal bowel sounds Musculoskeletal:no cyanosis of digits and no clubbing  NEURO: alert & oriented x 3 with fluent speech, no focal motor/sensory deficits  LABORATORY DATA:  I have reviewed the data as listed    Component Value Date/Time   NA 130 (L) 10/14/2016 1111   K 4.9 10/14/2016 1111   CL 92 (L) 10/10/2016 1521   CO2 28 10/14/2016 1111   GLUCOSE 90 10/14/2016 1111   BUN 12.2 10/14/2016 1111   CREATININE 0.7 10/14/2016 1111   CALCIUM 10.1 10/14/2016 1111   PROT 7.9 10/14/2016 1111   ALBUMIN 4.4 10/14/2016 1111   AST 20 10/14/2016 1111   ALT 19 10/14/2016 1111    ALKPHOS 104 10/14/2016 1111   BILITOT 0.34 10/14/2016 1111   GFRNONAA >60 10/10/2016 1521   GFRAA >60 10/10/2016 1521    No results found for: SPEP, UPEP  Lab Results  Component Value Date   WBC 6.4 10/14/2016   NEUTROABS 4.4 10/14/2016   HGB 15.2 10/14/2016   HCT 44.3 10/14/2016   MCV 91.4 10/14/2016   PLT 247 10/14/2016      Chemistry      Component Value Date/Time   NA 130 (L) 10/14/2016 1111   K 4.9 10/14/2016 1111   CL 92 (L) 10/10/2016 1521   CO2 28 10/14/2016 1111   BUN 12.2 10/14/2016 1111   CREATININE 0.7 10/14/2016 1111      Component Value Date/Time   CALCIUM 10.1 10/14/2016 1111   ALKPHOS 104 10/14/2016 1111   AST 20 10/14/2016 1111   ALT 19 10/14/2016 1111   BILITOT 0.34 10/14/2016 1111       RADIOGRAPHIC STUDIES: I have personally reviewed the radiological images as listed and agreed with the findings in the report. Dg Chest 2 View  Result Date: 10/10/2016 CLINICAL DATA:  Shortness of breath and arm pain, initial encounter EXAM: CHEST  2 VIEW COMPARISON:  07/13/2016 FINDINGS: The heart size and mediastinal contours are within normal limits. Both lungs are clear. The visualized skeletal structures are unremarkable. IMPRESSION: No active cardiopulmonary disease. Electronically Signed   By: Inez Catalina M.D.   On: 10/10/2016 18:03   Mr Cervical Spine Wo Contrast  Result Date: 10/10/2016 CLINICAL DATA:  Tingling sensation in both arms. Difficulty urinating. Recent diagnosis and treatment of lymphoma. EXAM: MRI CERVICAL SPINE WITHOUT CONTRAST TECHNIQUE: Multiplanar, multisequence MR imaging of the cervical spine was performed. No intravenous contrast was administered. COMPARISON:  CT 03/08/2016 FINDINGS: Alignment: Normal Vertebrae: No primary bone lesion. Some discogenic marrow changes at C4-5. Cord: No primary cord lesion. Posterior Fossa, vertebral arteries, paraspinal tissues: Negative Disc levels: No abnormality at foramen magnum, C1-2 or C2-3. C3-4:   Minimal uncovertebral hypertrophy.  No significant stenosis. C4-5: Spondylosis with endplate osteophytes covering bulging disc material. Narrowing of the ventral subarachnoid space but no compression of the cord. AP diameter of the canal 8 mm. Bilateral foraminal encroachment that could compress either or both C5 nerve roots. C5-6: Spondylosis with endplate osteophytes and bulging of the disc. Narrowing of the ventral subarachnoid space but no compression of the cord. AP diameter of the canal 9 mm. Foraminal encroachment bilaterally that could compress either or both C6 nerve roots. C6-7:  Normal interspace. C7-T1:  Normal interspace. IMPRESSION: No evidence of lymphoma involvement of the spine. Degenerative spondylosis at C4-5 and C5-6. Canal narrowing but no frank cord compression. Bilateral foraminal stenosis that could compress either or both C5 and C6 nerve roots. Electronically Signed   By: Nelson Chimes M.D.   On: 10/10/2016 17:04    ASSESSMENT &  PLAN:  Follicular lymphoma grade iiia, lymph nodes of head, face, and neck ([facility]) Examination is benign She does not need surveillance routine imaging study I will see her back in a few months for further follow-up  Tobacco abuse The patient started smoking a pack of cigarettes a day We spent a lot of time discussing the importance of nicotine cessation.  Hyponatremia She has recent hyponatremia, improved compared to blood work done from the emergency department This is likely caused by her medications She complained of dizziness I recommend she consult with her primary care doctor regarding her blood pressure medications and to consider medication adjustment if needed  DJD (degenerative joint disease) of cervical spine Her recent symptoms of tingling sensation is likely due to nerve impingement from degenerative joint disease We discussed exercise, referral to physical therapist if needed or to see a specialist She is currently taking muscle  relaxants and pain medicine as needed   No orders of the defined types were placed in this encounter.  All questions were answered. The patient knows to call the clinic with any problems, questions or concerns. No barriers to learning was detected. I spent 15 minutes counseling the patient face to face. The total time spent in the appointment was 20 minutes and more than 50% was on counseling and review of test results     Heath Lark, MD 10/15/2016 5:45 PM

## 2016-10-15 NOTE — Assessment & Plan Note (Signed)
Her recent symptoms of tingling sensation is likely due to nerve impingement from degenerative joint disease We discussed exercise, referral to physical therapist if needed or to see a specialist She is currently taking muscle relaxants and pain medicine as needed

## 2016-10-15 NOTE — Assessment & Plan Note (Signed)
The patient started smoking a pack of cigarettes a day We spent a lot of time discussing the importance of nicotine cessation. 

## 2017-01-21 ENCOUNTER — Ambulatory Visit (HOSPITAL_BASED_OUTPATIENT_CLINIC_OR_DEPARTMENT_OTHER): Payer: BLUE CROSS/BLUE SHIELD | Admitting: Hematology and Oncology

## 2017-01-21 ENCOUNTER — Encounter: Payer: Self-pay | Admitting: Hematology and Oncology

## 2017-01-21 ENCOUNTER — Telehealth: Payer: Self-pay | Admitting: *Deleted

## 2017-01-21 ENCOUNTER — Telehealth: Payer: Self-pay | Admitting: Hematology and Oncology

## 2017-01-21 ENCOUNTER — Other Ambulatory Visit (HOSPITAL_BASED_OUTPATIENT_CLINIC_OR_DEPARTMENT_OTHER): Payer: BLUE CROSS/BLUE SHIELD

## 2017-01-21 DIAGNOSIS — I10 Essential (primary) hypertension: Secondary | ICD-10-CM

## 2017-01-21 DIAGNOSIS — R5382 Chronic fatigue, unspecified: Secondary | ICD-10-CM | POA: Diagnosis not present

## 2017-01-21 DIAGNOSIS — C8231 Follicular lymphoma grade IIIa, lymph nodes of head, face, and neck: Secondary | ICD-10-CM

## 2017-01-21 DIAGNOSIS — Z720 Tobacco use: Secondary | ICD-10-CM | POA: Diagnosis not present

## 2017-01-21 LAB — COMPREHENSIVE METABOLIC PANEL
ALT: 13 U/L (ref 0–55)
AST: 16 U/L (ref 5–34)
Albumin: 3.7 g/dL (ref 3.5–5.0)
Alkaline Phosphatase: 82 U/L (ref 40–150)
Anion Gap: 10 mEq/L (ref 3–11)
BUN: 16.6 mg/dL (ref 7.0–26.0)
CO2: 24 mEq/L (ref 22–29)
Calcium: 9.2 mg/dL (ref 8.4–10.4)
Chloride: 107 mEq/L (ref 98–109)
Creatinine: 0.7 mg/dL (ref 0.6–1.1)
EGFR: >90 mL/min/{1.73_m2} (ref >90)
Glucose: 53 mg/dl — ABNORMAL LOW (ref 70–140)
Potassium: 3.8 mEq/L (ref 3.5–5.1)
Sodium: 141 mEq/L (ref 136–145)
Total Bilirubin: <0.22 mg/dL (ref 0.20–1.20)
Total Protein: 6.9 g/dL (ref 6.4–8.3)

## 2017-01-21 LAB — CBC WITH DIFFERENTIAL/PLATELET
BASO%: 0.2 % (ref 0.0–2.0)
Basophils Absolute: 0 10*3/uL (ref 0.0–0.1)
EOS%: 2.1 % (ref 0.0–7.0)
Eosinophils Absolute: 0.1 10*3/uL (ref 0.0–0.5)
HCT: 40.9 % (ref 34.8–46.6)
HGB: 13.5 g/dL (ref 11.6–15.9)
LYMPH%: 22.5 % (ref 14.0–49.7)
MCH: 31.8 pg (ref 25.1–34.0)
MCHC: 33 g/dL (ref 31.5–36.0)
MCV: 96.2 fL (ref 79.5–101.0)
MONO#: 0.4 10*3/uL (ref 0.1–0.9)
MONO%: 6.3 % (ref 0.0–14.0)
NEUT#: 4.6 10*3/uL (ref 1.5–6.5)
NEUT%: 68.9 % (ref 38.4–76.8)
Platelets: 228 10*3/uL (ref 145–400)
RBC: 4.25 10*6/uL (ref 3.70–5.45)
RDW: 14.1 % (ref 11.2–14.5)
WBC: 6.6 10*3/uL (ref 3.9–10.3)
lymph#: 1.5 10*3/uL (ref 0.9–3.3)

## 2017-01-21 NOTE — Telephone Encounter (Signed)
Gave patient avs and calendar for upcoming appointments.  °

## 2017-01-21 NOTE — Assessment & Plan Note (Signed)
she will continue current medical management. I recommend close follow-up with primary care doctor for medication adjustment.  

## 2017-01-21 NOTE — Progress Notes (Signed)
[facility] OFFICE PROGRESS NOTE  Patient Care Team: Velna Hatchet, MD as PCP - General (Internal Medicine) Leta Baptist, MD as Consulting Physician (Otolaryngology)  SUMMARY OF ONCOLOGIC HISTORY: Oncology History   FLIPI score 0 (low risk)     Follicular lymphoma grade iiia, lymph nodes of head, face, and neck ([facility])   03/08/2016 Imaging    Ct neck showed right level IIa lymphadenopathy at site of palpable interest. Additionally, there are prominent right upper cervical and left supraclavicular lymph nodes. This may represent nodal metastasis or be related to a systemic inflammatory or lymphoproliferative process. No infectious/and process in the neck identified. Effacement of the right vallecula an asymmetric thickening of the right aryepiglottic fold, direct visualization is recommended to evaluate for mucosal lesion.      03/19/2016 Pathology Results    Accession: [id number] Histologic type: Follicular lymphoma, large cell type. Grade (if applicable): High grade, 3 A. Flow cytometry: B cell population with kappa light chain excess ([id number]) Immunohistochemical stains: CD20, CD3, CD10, BCL-2, BCL-6 and Ki-67 with appropriate controls. Touch preps/imprints: A mixture of small and large lymphoid cells. Comments: The sections show effacement of the lymph nodal architecture by numerous variably sized and ill defined atypical lymphoid follicles characterized by lack of polarity, attenuated or absent mantle zones and relatively homogeneous composition of small and large transformed and cleaved lymphoid cells. The number of large lymphoid cells varies in different follicles but generally average more than 15 cells / high power field . This is associated with scattered mitosis. No diffuse component is identified. To further evaluate this process, flow cytometric analysis was performed and shows a B cell population displaying expression of pan B cell antigens including CD20 associated  with CD10 and kappa light chain excess. Immunohistochemical stains were performed and show that the lymphoid follicles are positive for CD20, CD10, BCL-6 and BCL-2. Ki 67 shows variable increased expression ranging for 10% to >50% in many of the follicles. The atypical lymphoid follicles are surrounded by an abundant population of T cells as primarily seen with CD3. 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 (BNS:kh 03-23-16)      03/19/2016 Procedure    Direct laryngoscopy was negative for abnormalities. Biopsies are taken from right neck LN      04/12/2016 PET scan    PET scan showed abnormal right station 2A lymph node measuring 1.1 cm in length with maximum SUV of 6.1, Deauville scale 4. There is some symmetric and probably physiologic activity along the lingual tonsils. Spleen normal, and no other adenopathy identified      05/11/2016 - 06/22/2016 Chemotherapy    She received R-CHOP chemo x 3      07/13/2016 PET scan    Interval resolution of hypermetabolic right cervical level 2A lymph node. No evidence of metabolically active lymphoma.       INTERVAL HISTORY: Please see below for problem oriented charting. She returns for further follow-up She denies new lymphadenopathy Denies recent infection She complain of chronic fatigue She continues to smoke a pack of cigarettes per day  REVIEW OF SYSTEMS:   Constitutional: Denies fevers, chills or abnormal weight loss Eyes: Denies blurriness of vision Ears, nose, mouth, throat, and face: Denies mucositis or sore throat Respiratory: Denies cough, dyspnea or wheezes Cardiovascular: Denies palpitation, chest discomfort or lower extremity swelling Gastrointestinal:  Denies nausea, heartburn or change in bowel habits Skin: Denies abnormal skin rashes Lymphatics: Denies new lymphadenopathy or easy bruising Neurological:Denies numbness, tingling or new weaknesses  Behavioral/Psych: Mood is stable, no new changes  All  other systems were reviewed with the patient and are negative.  I have reviewed the past medical history, past surgical history, social history and family history with the patient and they are unchanged from previous note.  ALLERGIES:  is allergic to lisinopril; penicillins; and prednisone.  MEDICATIONS:  Current Outpatient Prescriptions  Medication Sig Dispense Refill  . B Complex Vitamins (B-COMPLEX/B-12) TABS Take 1 tablet by mouth daily.    Marland Kitchen BIOTIN 5000 PO Take 1 capsule by mouth daily.    . Cholecalciferol (VITAMIN D3) 5000 units CAPS Take 5,000 Units by mouth daily.    Marland Kitchen escitalopram (LEXAPRO) 10 MG tablet Take 10 mg by mouth daily.   2  . esomeprazole (NEXIUM) 40 MG capsule Take 1 capsule (40 mg total) by mouth daily. 90 capsule 3  . magic mouthwash w/lidocaine SOLN Take 5 mLs by mouth 4 (four) times daily. (Patient not taking: Reported on 10/10/2016) 240 mL 1  . methocarbamol (ROBAXIN) 500 MG tablet Take 500 mg by mouth daily as needed for pain.  0  . mirtazapine (REMERON) 15 MG tablet Take 1 tablet (15 mg total) by mouth at bedtime. 30 tablet 1  . Multiple Vitamin (MULTIVITAMIN WITH MINERALS) TABS tablet Take 1 tablet by mouth daily.    . nicotine (NICODERM CQ - DOSED IN MG/24 HOURS) 14 mg/24hr patch Place 1 patch (14 mg total) onto the skin daily. (Patient not taking: Reported on 10/14/2016) 21 patch 0  . ondansetron (ZOFRAN) 8 MG tablet Take 1 tablet (8 mg total) by mouth every 8 (eight) hours as needed for refractory nausea / vomiting. (Patient not taking: Reported on 10/14/2016) 30 tablet 1  . oxyCODONE (OXY IR/ROXICODONE) 5 MG immediate release tablet Take 1 tablet (5 mg total) by mouth every 4 (four) hours as needed for severe pain. (Patient not taking: Reported on 10/14/2016) 90 tablet 0  . polyethylene glycol (MIRALAX / GLYCOLAX) packet Take 17 g by mouth daily.    . prochlorperazine (COMPAZINE) 10 MG tablet Take 1 tablet (10 mg total) by mouth every 6 (six) hours as needed (Nausea  or vomiting). (Patient not taking: Reported on 10/14/2016) 30 tablet 6  . rOPINIRole (REQUIP) 0.5 MG tablet Take 0.5 mg by mouth daily.  2  . valsartan-hydrochlorothiazide (DIOVAN-HCT) 160-25 MG tablet Take 1/2 tab PO Q AM. 15 tablet 4  . VITAMIN A PO Take 1 tablet by mouth daily. Unsure of strength     No current facility-administered medications for this visit.     PHYSICAL EXAMINATION: ECOG PERFORMANCE STATUS: 1 - Symptomatic but completely ambulatory  Vitals:   01/21/17 1409  BP: (!) 161/80  Pulse: 80  Resp: 16  Temp: 98.7 F (37.1 C)  SpO2: 96%   Filed Weights   01/21/17 1409  Weight: 157 lb 1.6 oz (71.3 kg)    GENERAL:alert, no distress and comfortable SKIN: skin color, texture, turgor are normal, no rashes or significant lesions EYES: normal, Conjunctiva are pink and non-injected, sclera clear OROPHARYNX:no exudate, no erythema and lips, buccal mucosa, and tongue normal  NECK: supple, thyroid normal size, non-tender, without nodularity LYMPH:  no palpable lymphadenopathy in the cervical, axillary or inguinal LUNGS: clear to auscultation and percussion with normal breathing effort HEART: regular rate & rhythm and no murmurs and no lower extremity edema ABDOMEN:abdomen soft, non-tender and normal bowel sounds Musculoskeletal:no cyanosis of digits and no clubbing  NEURO: alert & oriented x 3 with fluent speech, no focal  motor/sensory deficits  LABORATORY DATA:  I have reviewed the data as listed    Component Value Date/Time   NA 141 01/21/2017 1347   K 3.8 01/21/2017 1347   CL 92 (L) 10/10/2016 1521   CO2 24 01/21/2017 1347   GLUCOSE 53 (L) 01/21/2017 1347   BUN 16.6 01/21/2017 1347   CREATININE 0.7 01/21/2017 1347   CALCIUM 9.2 01/21/2017 1347   PROT 6.9 01/21/2017 1347   ALBUMIN 3.7 01/21/2017 1347   AST 16 01/21/2017 1347   ALT 13 01/21/2017 1347   ALKPHOS 82 01/21/2017 1347   BILITOT <0.22 01/21/2017 1347   GFRNONAA >60 10/10/2016 1521   GFRAA >60  10/10/2016 1521    No results found for: SPEP, UPEP  Lab Results  Component Value Date   WBC 6.6 01/21/2017   NEUTROABS 4.6 01/21/2017   HGB 13.5 01/21/2017   HCT 40.9 01/21/2017   MCV 96.2 01/21/2017   PLT 228 01/21/2017      Chemistry      Component Value Date/Time   NA 141 01/21/2017 1347   K 3.8 01/21/2017 1347   CL 92 (L) 10/10/2016 1521   CO2 24 01/21/2017 1347   BUN 16.6 01/21/2017 1347   CREATININE 0.7 01/21/2017 1347      Component Value Date/Time   CALCIUM 9.2 01/21/2017 1347   ALKPHOS 82 01/21/2017 1347   AST 16 01/21/2017 1347   ALT 13 01/21/2017 1347   BILITOT <0.22 01/21/2017 1347       ASSESSMENT & PLAN:  Follicular lymphoma grade iiia, lymph nodes of head, face, and neck ([facility]) Examination is benign She does not need surveillance routine imaging study I will see her back in a few months for further follow-up  Tobacco abuse The patient started smoking a pack of cigarettes a day We spent a lot of time discussing the importance of nicotine cessation.  Essential hypertension she will continue current medical management. I recommend close follow-up with primary care doctor for medication adjustment.   Chronic fatigue She has chronic fatigue since completion of chemotherapy I recommend [employer] program through the [facility] She requested dietitian visit related to healthy eating habits I will try to see if I can get her dietitian consult   No orders of the defined types were placed in this encounter.  All questions were answered. The patient knows to call the clinic with any problems, questions or concerns. No barriers to learning was detected. I spent 15 minutes counseling the patient face to face. The total time spent in the appointment was 20 minutes and more than 50% was on counseling and review of test results     Heath Lark, MD 01/21/2017 5:43 PM

## 2017-01-21 NOTE — Assessment & Plan Note (Signed)
She has chronic fatigue since completion of chemotherapy I recommend [employer] program through the [facility] She requested dietitian visit related to healthy eating habits I will try to see if I can get her dietitian consult

## 2017-01-21 NOTE — Telephone Encounter (Signed)
Left message for Emily Santiago requesting a suggestion for a "healthy eating program".

## 2017-01-21 NOTE — Assessment & Plan Note (Signed)
Examination is benign She does not need surveillance routine imaging study I will see her back in a few months for further follow-up

## 2017-01-21 NOTE — Assessment & Plan Note (Signed)
The patient started smoking a pack of cigarettes a day We spent a lot of time discussing the importance of nicotine cessation.

## 2017-01-24 ENCOUNTER — Telehealth: Payer: Self-pay | Admitting: Nutrition

## 2017-01-24 NOTE — Telephone Encounter (Signed)
Received message patient interested in healthy diet information. She has completed treatment. She should be referred to Nutrition and diabetes education services and not to Oncology RD. Called patient to let her know. Offered to mail her healthy diet information.

## 2017-02-03 ENCOUNTER — Other Ambulatory Visit: Payer: Self-pay | Admitting: Internal Medicine

## 2017-02-03 DIAGNOSIS — Z1239 Encounter for other screening for malignant neoplasm of breast: Secondary | ICD-10-CM

## 2017-02-09 DIAGNOSIS — Z6829 Body mass index (BMI) 29.0-29.9, adult: Secondary | ICD-10-CM | POA: Diagnosis not present

## 2017-02-09 DIAGNOSIS — I10 Essential (primary) hypertension: Secondary | ICD-10-CM | POA: Diagnosis not present

## 2017-02-09 DIAGNOSIS — M79671 Pain in right foot: Secondary | ICD-10-CM | POA: Diagnosis not present

## 2017-02-18 ENCOUNTER — Observation Stay (HOSPITAL_COMMUNITY)
Admission: EM | Admit: 2017-02-18 | Discharge: 2017-02-20 | Disposition: A | Discharge status: Home / Self Care | Payer: BLUE CROSS/BLUE SHIELD | Attending: Internal Medicine | Admitting: Internal Medicine

## 2017-02-18 ENCOUNTER — Emergency Department (HOSPITAL_COMMUNITY): Payer: BLUE CROSS/BLUE SHIELD

## 2017-02-18 ENCOUNTER — Encounter (HOSPITAL_COMMUNITY): Payer: Self-pay

## 2017-02-18 DIAGNOSIS — K449 Diaphragmatic hernia without obstruction or gangrene: Secondary | ICD-10-CM | POA: Diagnosis not present

## 2017-02-18 DIAGNOSIS — C8231 Follicular lymphoma grade IIIa, lymph nodes of head, face, and neck: Secondary | ICD-10-CM | POA: Diagnosis present

## 2017-02-18 DIAGNOSIS — R740 Nonspecific elevation of levels of transaminase and lactic acid dehydrogenase [LDH]: Secondary | ICD-10-CM | POA: Diagnosis not present

## 2017-02-18 DIAGNOSIS — I700 Atherosclerosis of aorta: Secondary | ICD-10-CM | POA: Insufficient documentation

## 2017-02-18 DIAGNOSIS — C8211 Follicular lymphoma grade II, lymph nodes of head, face, and neck: Secondary | ICD-10-CM | POA: Insufficient documentation

## 2017-02-18 DIAGNOSIS — I952 Hypotension due to drugs: Secondary | ICD-10-CM | POA: Diagnosis not present

## 2017-02-18 DIAGNOSIS — R1013 Epigastric pain: Secondary | ICD-10-CM | POA: Diagnosis present

## 2017-02-18 DIAGNOSIS — E785 Hyperlipidemia, unspecified: Secondary | ICD-10-CM | POA: Diagnosis not present

## 2017-02-18 DIAGNOSIS — I10 Essential (primary) hypertension: Secondary | ICD-10-CM | POA: Diagnosis not present

## 2017-02-18 DIAGNOSIS — Z8711 Personal history of peptic ulcer disease: Secondary | ICD-10-CM | POA: Insufficient documentation

## 2017-02-18 DIAGNOSIS — F419 Anxiety disorder, unspecified: Secondary | ICD-10-CM | POA: Insufficient documentation

## 2017-02-18 DIAGNOSIS — R0789 Other chest pain: Principal | ICD-10-CM | POA: Insufficient documentation

## 2017-02-18 DIAGNOSIS — R079 Chest pain, unspecified: Secondary | ICD-10-CM | POA: Diagnosis not present

## 2017-02-18 DIAGNOSIS — E663 Overweight: Secondary | ICD-10-CM | POA: Insufficient documentation

## 2017-02-18 DIAGNOSIS — I209 Angina pectoris, unspecified: Secondary | ICD-10-CM

## 2017-02-18 DIAGNOSIS — I160 Hypertensive urgency: Secondary | ICD-10-CM | POA: Diagnosis not present

## 2017-02-18 DIAGNOSIS — Z6829 Body mass index (BMI) 29.0-29.9, adult: Secondary | ICD-10-CM | POA: Insufficient documentation

## 2017-02-18 DIAGNOSIS — Z880 Allergy status to penicillin: Secondary | ICD-10-CM | POA: Insufficient documentation

## 2017-02-18 DIAGNOSIS — M797 Fibromyalgia: Secondary | ICD-10-CM | POA: Diagnosis not present

## 2017-02-18 DIAGNOSIS — R5382 Chronic fatigue, unspecified: Secondary | ICD-10-CM | POA: Insufficient documentation

## 2017-02-18 DIAGNOSIS — K219 Gastro-esophageal reflux disease without esophagitis: Secondary | ICD-10-CM | POA: Insufficient documentation

## 2017-02-18 DIAGNOSIS — Z888 Allergy status to other drugs, medicaments and biological substances status: Secondary | ICD-10-CM | POA: Diagnosis not present

## 2017-02-18 DIAGNOSIS — K5730 Diverticulosis of large intestine without perforation or abscess without bleeding: Secondary | ICD-10-CM | POA: Diagnosis not present

## 2017-02-18 DIAGNOSIS — K760 Fatty (change of) liver, not elsewhere classified: Secondary | ICD-10-CM | POA: Insufficient documentation

## 2017-02-18 DIAGNOSIS — E876 Hypokalemia: Secondary | ICD-10-CM | POA: Diagnosis present

## 2017-02-18 DIAGNOSIS — Z79899 Other long term (current) drug therapy: Secondary | ICD-10-CM | POA: Diagnosis not present

## 2017-02-18 DIAGNOSIS — F17210 Nicotine dependence, cigarettes, uncomplicated: Secondary | ICD-10-CM | POA: Diagnosis not present

## 2017-02-18 DIAGNOSIS — Z720 Tobacco use: Secondary | ICD-10-CM | POA: Diagnosis not present

## 2017-02-18 DIAGNOSIS — Z9049 Acquired absence of other specified parts of digestive tract: Secondary | ICD-10-CM | POA: Diagnosis not present

## 2017-02-18 DIAGNOSIS — R51 Headache: Secondary | ICD-10-CM | POA: Diagnosis not present

## 2017-02-18 DIAGNOSIS — I959 Hypotension, unspecified: Secondary | ICD-10-CM | POA: Diagnosis not present

## 2017-02-18 LAB — BASIC METABOLIC PANEL
Anion gap: 13 (ref 5–15)
BUN: 16 mg/dL (ref 6–20)
CO2: 23 mmol/L (ref 22–32)
Calcium: 9.2 mg/dL (ref 8.9–10.3)
Chloride: 101 mmol/L (ref 101–111)
Creatinine, Ser: 0.55 mg/dL (ref 0.44–1.00)
GFR calc Af Amer: >60 mL/min (ref >60)
GFR calc non Af Amer: >60 mL/min (ref >60)
Glucose, Bld: 80 mg/dL (ref 65–99)
Potassium: 3.3 mmol/L — ABNORMAL LOW (ref 3.5–5.1)
Sodium: 137 mmol/L (ref 135–145)

## 2017-02-18 LAB — CBC
HCT: 39.8 % (ref 36.0–46.0)
Hemoglobin: 13.7 g/dL (ref 12.0–15.0)
MCH: 32.2 pg (ref 26.0–34.0)
MCHC: 34.4 g/dL (ref 30.0–36.0)
MCV: 93.4 fL (ref 78.0–100.0)
Platelets: 223 10*3/uL (ref 150–400)
RBC: 4.26 MIL/uL (ref 3.87–5.11)
RDW: 13.3 % (ref 11.5–15.5)
WBC: 6.7 10*3/uL (ref 4.0–10.5)

## 2017-02-18 LAB — I-STAT TROPONIN, ED
Troponin i, poc: 0.01 ng/mL (ref 0.00–0.08)
Troponin i, poc: 0 ng/mL (ref 0.00–0.08)

## 2017-02-18 LAB — BRAIN NATRIURETIC PEPTIDE: B Natriuretic Peptide: 16.5 pg/mL (ref 0.0–100.0)

## 2017-02-18 MED ORDER — IOPAMIDOL (ISOVUE-370) INJECTION 76%
100.0000 mL | Freq: Once | INTRAVENOUS | Status: AC | PRN
  Administered 2017-02-18: 100 mL via INTRAVENOUS

## 2017-02-18 MED ORDER — MORPHINE SULFATE (PF) 4 MG/ML IV SOLN
4.0000 mg | Freq: Once | INTRAVENOUS | Status: AC
  Administered 2017-02-18: 4 mg via INTRAVENOUS
  Filled 2017-02-18: qty 1

## 2017-02-18 MED ORDER — IOPAMIDOL (ISOVUE-370) INJECTION 76%
INTRAVENOUS | Status: AC
  Administered 2017-02-18: 21:00:00
  Filled 2017-02-18: qty 100

## 2017-02-18 MED ORDER — ASPIRIN 81 MG PO CHEW
324.0000 mg | CHEWABLE_TABLET | Freq: Once | ORAL | Status: AC
  Administered 2017-02-18: 324 mg via ORAL
  Filled 2017-02-18: qty 4

## 2017-02-18 MED ORDER — POLYETHYLENE GLYCOL 3350 17 G PO PACK
17.0000 g | PACK | ORAL | Status: DC
  Administered 2017-02-19: 17 g via ORAL
  Filled 2017-02-18: qty 1

## 2017-02-18 MED ORDER — POTASSIUM CHLORIDE 10 MEQ/100ML IV SOLN
10.0000 meq | INTRAVENOUS | Status: AC
  Administered 2017-02-19 (×2): 10 meq via INTRAVENOUS
  Filled 2017-02-18 (×2): qty 100

## 2017-02-18 MED ORDER — POTASSIUM CHLORIDE CRYS ER 20 MEQ PO TBCR
40.0000 meq | EXTENDED_RELEASE_TABLET | Freq: Once | ORAL | Status: AC
  Administered 2017-02-18: 40 meq via ORAL
  Filled 2017-02-18: qty 2

## 2017-02-18 MED ORDER — ONDANSETRON HCL 4 MG/2ML IJ SOLN
4.0000 mg | Freq: Four times a day (QID) | INTRAMUSCULAR | Status: DC | PRN
  Administered 2017-02-19 – 2017-02-20 (×2): 4 mg via INTRAVENOUS
  Filled 2017-02-18 (×2): qty 2

## 2017-02-18 MED ORDER — PANTOPRAZOLE SODIUM 40 MG PO TBEC
40.0000 mg | DELAYED_RELEASE_TABLET | Freq: Two times a day (BID) | ORAL | Status: DC
  Administered 2017-02-19 – 2017-02-20 (×3): 40 mg via ORAL
  Filled 2017-02-18 (×3): qty 1

## 2017-02-18 MED ORDER — ACETAMINOPHEN 650 MG RE SUPP: 650.0000 mg | Freq: Four times a day (QID) | RECTAL | Status: DC | PRN

## 2017-02-18 MED ORDER — LABETALOL HCL 5 MG/ML IV SOLN
10.0000 mg | Freq: Once | INTRAVENOUS | Status: DC
  Filled 2017-02-18: qty 4

## 2017-02-18 MED ORDER — HYDROCODONE-ACETAMINOPHEN 5-325 MG PO TABS
1.0000 | ORAL_TABLET | ORAL | Status: DC | PRN
  Administered 2017-02-19 (×2): 2 via ORAL
  Administered 2017-02-19: 1 via ORAL
  Administered 2017-02-20: 2 via ORAL
  Filled 2017-02-18 (×3): qty 2
  Filled 2017-02-18: qty 1

## 2017-02-18 MED ORDER — SODIUM CHLORIDE 0.9 % IV SOLN
INTRAVENOUS | Status: DC
  Administered 2017-02-19: 01:00:00 via INTRAVENOUS

## 2017-02-18 MED ORDER — ACETAMINOPHEN 325 MG PO TABS
650.0000 mg | ORAL_TABLET | Freq: Four times a day (QID) | ORAL | Status: DC | PRN
  Administered 2017-02-19: 650 mg via ORAL
  Filled 2017-02-18: qty 2

## 2017-02-18 MED ORDER — SODIUM CHLORIDE 0.9 % IV BOLUS (SEPSIS)
1000.0000 mL | Freq: Once | INTRAVENOUS | Status: AC
  Administered 2017-02-18: 1000 mL via INTRAVENOUS

## 2017-02-18 MED ORDER — ZOLPIDEM TARTRATE 5 MG PO TABS
5.0000 mg | ORAL_TABLET | Freq: Every evening | ORAL | Status: DC | PRN
  Administered 2017-02-19: 5 mg via ORAL
  Filled 2017-02-18 (×2): qty 1

## 2017-02-18 MED ORDER — ONDANSETRON HCL 4 MG PO TABS: 4.0000 mg | ORAL_TABLET | Freq: Four times a day (QID) | ORAL | Status: DC | PRN

## 2017-02-18 MED ORDER — HYDRALAZINE HCL 20 MG/ML IJ SOLN
10.0000 mg | Freq: Once | INTRAMUSCULAR | Status: AC
  Administered 2017-02-18: 10 mg via INTRAVENOUS
  Filled 2017-02-18: qty 1

## 2017-02-18 NOTE — H&P (Signed)
Emily Santiago:[id number] DOB: [birth year] DOA: 02/18/2017     PCP: Velna Hatchet, MD   Outpatient Specialists: [facility] oncology Fabienne BrunsLoletha Carrow [facility] Patient coming from:  home Lives   With family    Chief Complaint: Chest pain  HPI: Emily Santiago is a 56 y.o. female with medical history significant of Follicular lymphoma grade II, HTN, fibromyalgia chronic fatigue syndrome  chronic gastritis with multiple gastric ulcers in the past.   Presented with intermittent left sided chest pain started since yesterday at 9 PM been going on for the past 24 hours have had intermittent tingling of her left hand and association shortness of breath worse with exertion Otherwise also reporting some headaches Her blood pressure has been lately poorly controlled admitted fluctuating between high and low readings. She attributes this to her chemotherapy. She started new blood pressure medication 3 days ago  Otherwise no neurological complaints no confusion no cough nausea vomiting or abdominal pain No prior history of PE Denies melena or blood in stool reports epigastric pain Initially blood pressure was elevated up to 462 systolic she was given 10 mg of hydralazine and her blood pressure dropped to 110s and she became more symptomatic and developed abdominal pain Troponin negative 2 Have bee using NSAIDs at home extensively.     pertinent Chronic problems: History of follicular lymphoma followed by oncology status post R-CHOP x3 last PET scan from January 2018 showed resolution  History of hypertension on Diovan. Denies prior history of coronary artery disease no history of stress tests prior to this. Last echogram October 2017 EF 55-60 percent with mild concentric hypertrophy    IN ER:  Temp (24hrs), Avg:97.8 F (36.6 C), Min:97.8 F (36.6 C), Max:97.8 F (36.6 C)      on arrival  ED Triage Vitals  Enc Vitals Group     BP 02/18/17 1639 (!) 171/105     Pulse Rate 02/18/17 1639 70     Resp  02/18/17 1639 18     Temp 02/18/17 1639 97.8 F (36.6 C)     Temp Source 02/18/17 1639 Oral     SpO2 02/18/17 1639 98 %     Weight --      Height --      Head Circumference --      Peak Flow --      Pain Score 02/18/17 1645 5     Pain Loc --      Pain Edu? --      Excl. in [place]? --     Latest RR 16 93% BP 100/50   troponin 0.00  Sodium 1:30 7K3.3 creatinine 0.5  WBC 6.7 hemoglobin 13.7 BNP 16.4  CT angiogram of chest abdomen and pelvis showing no dissection and no PE could not rule out small gastric ulcer and pyloric region and fatty infiltration of the liver  Following Medications were ordered in ER: Medications  labetalol (NORMODYNE,TRANDATE) injection 10 mg (0 mg Intravenous Hold 02/18/17 2206)  sodium chloride 0.9 % bolus 1,000 mL (not administered)  morphine 4 MG/ML injection 4 mg (4 mg Intravenous Given 02/18/17 1949)  aspirin chewable tablet 324 mg (324 mg Oral Given 02/18/17 1937)  hydrALAZINE (APRESOLINE) injection 10 mg (10 mg Intravenous Given 02/18/17 2052)  morphine 4 MG/ML injection 4 mg (4 mg Intravenous Given 02/18/17 2052)  iopamidol (ISOVUE-370) 76 % injection 100 mL (100 mLs Intravenous Contrast Given 02/18/17 2059)  iopamidol (ISOVUE-370) 76 % injection (  Contrast Given 02/18/17 2119)  Hospitalist was called for admission for  Chest pain evaluation  Review of Systems:    Pertinent positives include: chest pain, abdominal pain  Constitutional:  No weight loss, night sweats, Fevers, chills, fatigue, weight loss  HEENT:  No headaches, Difficulty swallowing,Tooth/dental problems,Sore throat,  No sneezing, itching, ear ache, nasal congestion, post nasal drip,  Cardio-vascular:  No , Orthopnea, PND, anasarca, dizziness, palpitations.no Bilateral lower extremity swelling  GI:  No heartburn, indigestion, abdominal pain, nausea, vomiting, diarrhea, change in bowel habits, loss of appetite, melena, blood in stool, hematemesis Resp:  no shortness of breath at rest. No  dyspnea on exertion, No excess mucus, no productive cough, No non-productive cough, No coughing up of blood.No change in color of mucus.No wheezing. Skin:  no rash or lesions. No jaundice GU:  no dysuria, change in color of urine, no urgency or frequency. No straining to urinate.  No flank pain.  Musculoskeletal:  No joint pain or no joint swelling. No decreased range of motion. No back pain.  Psych:  No change in mood or affect. No depression or anxiety. No memory loss.  Neuro: no localizing neurological complaints, no tingling, no weakness, no double vision, no gait abnormality, no slurred speech, no confusion  As per HPI otherwise 10 point review of systems negative.   Past Medical History: Past Medical History:  Diagnosis Date  . Anxiety   . Hypertension   . Lymphoma ([place])   . Varicose veins    Past Surgical History:  Procedure Laterality Date  . ABDOMINAL HYSTERECTOMY    . CHOLECYSTECTOMY    . ESOPHAGEAL MANOMETRY N/A 04/28/2016   Procedure: ESOPHAGEAL MANOMETRY (EM);  Surgeon: Mauri Pole, MD;  Location: [facility] ENDOSCOPY;  Service: Endoscopy;  Laterality: N/A;  dr. Loletha Carrow  . LYMPH NODE BIOPSY    . WRIST SURGERY Right      Social History:  Ambulatory  independently      reports that she has been smoking Cigarettes.  She has a 35.00 pack-year smoking history. She has never used smokeless tobacco. She reports that she does not drink alcohol or use drugs.  Allergies:   Allergies  Allergen Reactions  . Lisinopril Cough  . Penicillins Nausea Only and Rash    Stomach upset Has patient had a PCN reaction causing immediate rash, facial/tongue/throat swelling, SOB or lightheadedness with hypotension: No Has patient had a PCN reaction causing severe rash involving mucus membranes or skin necrosis: Yes Has patient had a PCN reaction that required hospitalization: NO Has patient had a PCN reaction occurring within the last 10 years: Yes If all of the above answers are  "NO", then may proceed with Cephalosporin use.    . Prednisone     "makes me crazy"       Family History:   Family History  Problem Relation Age of Onset  . Heart disease Father   . Colon cancer Father        dx in his 50's  . Heart disease Maternal Grandmother   . Prostate cancer Maternal Uncle   . Brain cancer Brother   . Liver cancer Brother   . Bone cancer Brother   . Prostate cancer Brother     Medications: Prior to Admission medications   Medication Sig Start Date End Date Taking? Authorizing Provider  B Complex Vitamins (B-COMPLEX/B-12) TABS Take 1 tablet by mouth daily.   Yes [provider]  BIOTIN 5000 PO Take 1 capsule by mouth daily.   Yes [provider]  Cholecalciferol (VITAMIN D3) 5000 units CAPS Take 5,000 Units by mouth daily.   Yes [provider]  esomeprazole (NEXIUM) 40 MG capsule Take 1 capsule (40 mg total) by mouth daily. 06/15/16  Yes Gorsuch, Ni, MD  mirtazapine (REMERON) 15 MG tablet Take 1 tablet (15 mg total) by mouth at bedtime. 08/24/16  Yes Heath Lark, MD  Multiple Vitamin (MULTIVITAMIN WITH MINERALS) TABS tablet Take 1 tablet by mouth daily.   Yes [provider]  polyethylene glycol (MIRALAX / GLYCOLAX) packet Take 17 g by mouth every other day.    Yes [provider]  telmisartan (MICARDIS) 40 MG tablet Take 40 mg by mouth daily.  02/09/17  Yes [provider]  magic mouthwash w/lidocaine SOLN Take 5 mLs by mouth 4 (four) times daily. Patient not taking: Reported on 10/10/2016 06/04/16   Susanne Borders, NP  nicotine (NICODERM CQ - DOSED IN MG/24 HOURS) 14 mg/24hr patch Place 1 patch (14 mg total) onto the skin daily. Patient not taking: Reported on 10/14/2016 06/01/16   Heath Lark, MD  ondansetron (ZOFRAN) 8 MG tablet Take 1 tablet (8 mg total) by mouth every 8 (eight) hours as needed for refractory nausea / vomiting. Patient not taking: Reported on 10/14/2016 05/04/16   Heath Lark, MD    oxyCODONE (OXY IR/ROXICODONE) 5 MG immediate release tablet Take 1 tablet (5 mg total) by mouth every 4 (four) hours as needed for severe pain. Patient not taking: Reported on 10/14/2016 07/20/16   Heath Lark, MD  prochlorperazine (COMPAZINE) 10 MG tablet Take 1 tablet (10 mg total) by mouth every 6 (six) hours as needed (Nausea or vomiting). Patient not taking: Reported on 10/14/2016 05/04/16   Heath Lark, MD  valsartan-hydrochlorothiazide (DIOVAN-HCT) 160-25 MG tablet Take 1/2 tab PO Q AM. Patient not taking: Reported on 02/18/2017 06/08/16   Susanne Borders, NP    Physical Exam: Patient Vitals for the past 24 hrs:  BP Temp Temp src Pulse Resp SpO2  02/18/17 2204 (!) 100/50 - - 75 16 93 %  02/18/17 1842 (!) 196/108 - - 80 18 99 %  02/18/17 1840 (!) 189/80 - - 69 14 98 %  02/18/17 1639 (!) 171/105 97.8 F (36.6 C) Oral 70 18 98 %    1. General:  in No Acute distress  well  -appearing 2. Psychological: Alert and  Oriented 3. Head/ENT:     Dry Mucous Membranes                          Head Non traumatic, neck supple                           Poor Dentition 4. SKIN:   decreased Skin turgor,  Skin clean Dry and intact no rash 5. Heart: Regular rate and rhythm no  Murmur, no Rub or gallop 6. Lungs:  , no wheezes mild bibasilar  crackles   7. Abdomen: Soft, epigastric tenderness, Non distended  obese  bowel sounds present 8. Lower extremities: no clubbing, cyanosis, or edema 9. Neurologically Grossly intact, moving all 4 extremities equally  10. MSK: Normal range of motion   body mass index is unknown because there is no height or weight on file.  Labs on Admission:   Labs on Admission: I have personally reviewed following labs and imaging studies  CBC:  Recent Labs Lab 02/18/17 1659  WBC 6.7  HGB 13.7  HCT 39.8  MCV 93.4  PLT 366   Basic Metabolic Panel:  Recent Labs Lab 02/18/17 1659  NA 137  K 3.3*  CL 101  CO2 23  GLUCOSE 80  BUN 16  CREATININE 0.55  CALCIUM  9.2   GFR: CrCl cannot be calculated (Unknown ideal weight.). Liver Function Tests: No results for input(s): AST, ALT, ALKPHOS, BILITOT, PROT, ALBUMIN in the last 168 hours. No results for input(s): LIPASE, AMYLASE in the last 168 hours. No results for input(s): AMMONIA in the last 168 hours. Coagulation Profile: No results for input(s): INR, PROTIME in the last 168 hours. Cardiac Enzymes: No results for input(s): CKTOTAL, CKMB, CKMBINDEX, TROPONINI in the last 168 hours. BNP (last 3 results) No results for input(s): PROBNP in the last 8760 hours. HbA1C: No results for input(s): HGBA1C in the last 72 hours. CBG: No results for input(s): GLUCAP in the last 168 hours. Lipid Profile: No results for input(s): CHOL, HDL, LDLCALC, TRIG, CHOLHDL, LDLDIRECT in the last 72 hours. Thyroid Function Tests: No results for input(s): TSH, T4TOTAL, FREET4, T3FREE, THYROIDAB in the last 72 hours. Anemia Panel: No results for input(s): VITAMINB12, FOLATE, FERRITIN, TIBC, IRON, RETICCTPCT in the last 72 hours. Urine analysis:  Sepsis Labs: @LABRCNTIP (procalcitonin:4,lacticidven:4) )No results found for this or any previous visit (from the past 240 hour(s)).    UA  not ordered  No results found for: HGBA1C  CrCl cannot be calculated (Unknown ideal weight.).  BNP (last 3 results) No results for input(s): PROBNP in the last 8760 hours.   ECG REPORT  Independently reviewed Rate:78  Rhythm: NSR ST&T Change: No acute ischemic changes   QTC 445  There were no vitals filed for this visit.   Cultures:    Component Value Date/Time   SDES URINE, RANDOM 01/29/2009 1655   SPECREQUEST CX ADDED ON 01/29/2009 @ 2048 01/29/2009 1655   CULT  01/29/2009 1655    Multiple bacterial morphotypes present, none predominant. Suggest appropriate recollection if clinically indicated.   REPTSTATUS 01/31/2009 FINAL 01/29/2009 1655     Radiological Exams on Admission: Dg Chest 2 View  Result Date:  02/18/2017 CLINICAL DATA:  Chest pain EXAM: CHEST  2 VIEW COMPARISON:  10/10/2016 FINDINGS: The heart size and mediastinal contours are within normal limits. Both lungs are clear. Stable wedging of vertebra at thoracolumbar junction. IMPRESSION: No active cardiopulmonary disease. Electronically Signed   By: Donavan Foil M.D.   On: 02/18/2017 17:41   Ct Head Wo Contrast  Result Date: 02/18/2017 CLINICAL DATA:  Occipital headache.  Treated lymphoma. EXAM: CT HEAD WITHOUT CONTRAST TECHNIQUE: Contiguous axial images were obtained from the base of the skull through the vertex without intravenous contrast. COMPARISON:  PET-CT dated 07/13/2016. FINDINGS: Brain: Diffusely enlarged ventricles and subarachnoid spaces. Patchy white matter low density in both cerebral hemispheres. No intracranial hemorrhage, mass lesion or CT evidence of acute infarction. Vascular: No hyperdense vessel or unexpected calcification. Skull: Normal. Negative for fracture or focal lesion. Sinuses/Orbits: Unremarkable. Other: None. IMPRESSION: 1. No acute abnormality. 2. Mild diffuse cerebral and cerebellar atrophy. 3. Minimal chronic small vessel white matter ischemic changes in both cerebral hemispheres. Electronically Signed   By: Claudie Revering M.D.   On: 02/18/2017 18:37   Ct Angio Chest/abd/pel For Dissection W And/or Wo Contrast  Result Date: 02/18/2017 CLINICAL DATA:  Intermittent left chest pain since last night. Chills and tingling to the left hand. Shortness of breath. Chest pain worse with ambulation. Headache. History of lymphoma. EXAM: CT ANGIOGRAPHY CHEST,  ABDOMEN AND PELVIS TECHNIQUE: Multidetector CT imaging through the chest, abdomen and pelvis was performed using the standard protocol during bolus administration of intravenous contrast. Multiplanar reconstructed images and MIPs were obtained and reviewed to evaluate the vascular anatomy. CONTRAST:  100 mL Isovue 370 COMPARISON:  CT chest lung cancer screening protocol  09/15/2015 FINDINGS: CTA CHEST FINDINGS Cardiovascular: Noncontrast images of the chest demonstrate scattered calcification in the aortic valve and in the thoracic aorta. No evidence of intramural hematoma. Images obtained during arterial phase of IV contrast administration demonstrate patent thoracic aorta and great vessels. No evidence of aneurysm or dissection. Motion artifact at the aortic root. Central pulmonary arteries are well opacified without evidence of significant pulmonary embolus. Normal heart size. No pericardial effusions. Mediastinum/Nodes: Small esophageal hiatal hernia. Esophagus is decompressed. No significant lymphadenopathy in the chest. Thyroid gland is unremarkable. Lungs/Pleura: Mild emphysematous changes in the lungs most prominent at the apices. Slight fibrosis or dependent change in the lung bases. No airspace disease or consolidation. No pleural effusions. No pneumothorax. Airways are patent. Musculoskeletal: Degenerative changes in the spine. No destructive bone lesions. Review of the MIP images confirms the above findings. CTA ABDOMEN AND PELVIS FINDINGS VASCULAR Aorta: Normal caliber abdominal aorta. No evidence of aneurysm or dissection. Scattered mild calcific and noncalcific atherosclerotic changes. Celiac: Patent without evidence of aneurysm, dissection, vasculitis or significant stenosis. SMA: Patent without evidence of aneurysm, dissection, vasculitis or significant stenosis. Renals: Single right renal artery with early bifurcation. Duplicated left renal artery. Renal arteries are patent bilaterally without evidence of aneurysm or dissection. Renal nephrograms are symmetrical. IMA: Patent without evidence of aneurysm, dissection, vasculitis or significant stenosis. Inflow: Patent without evidence of aneurysm, dissection, vasculitis or significant stenosis. Veins: No obvious venous abnormality within the limitations of this arterial phase study. Review of the MIP images confirms  the above findings. NON-VASCULAR Hepatobiliary: Mild diffuse fatty infiltration of the liver. No focal lesions identified. Surgical absence of the gallbladder. No bile duct dilatation. Pancreas: Unremarkable. No pancreatic ductal dilatation or surrounding inflammatory changes. Spleen: Normal in size without focal abnormality. Adrenals/Urinary Tract: Adrenal glands are unremarkable. Kidneys are normal, without renal calculi, focal lesion, or hydronephrosis. Bladder is unremarkable. Stomach/Bowel: Stomach is mostly nondistended. There is suggestion of possible focal gastric wall thickening at the pyloric region with a tiny gas bubble within the wall. This may indicate a gastric ulcer. No evidence of perforation. Small bowel are mostly decompressed. Scattered stool throughout the colon without distention or wall thickening. Scattered diverticula in the colon. No evidence of diverticulitis. Appendix is not identified. Lymphatic: No significant lymphadenopathy. Reproductive: Status post hysterectomy. No adnexal masses. Other: No free air or free fluid in the abdomen. Abdominal wall musculature appears intact. Musculoskeletal: Degenerative changes in the spine. No destructive bone lesions or acute changes demonstrated. Review of the MIP images confirms the above findings. IMPRESSION: 1. No evidence of aneurysm or dissection of the thoracic or abdominal aorta. 2. Minimal aortic atherosclerosis. 3. Possible small gastric ulcer in the pyloric region. No perforation. 4. Fatty infiltration of the liver. Electronically Signed   By: Lucienne Capers M.D.   On: 02/18/2017 21:39    Chart has been reviewed    Assessment/Plan  56 y.o. female with medical history significant of Follicular lymphoma grade II, HTN, fibromyalgia chronic fatigue syndrome  Admitted for severe chest pain evaluation  Present on Admission: . Essential hypertension - allow some permissive hypertension given soft blood pressures, hold home  medications . Follicular lymphoma grade iiia, lymph nodes  of head, face, and neck ([place]) - have emailed Dr. Gwenette Greet is to notify patient has been admitted . Tobacco abuse . Chest pain - negative troponins despite chest pain for the past 24 hours, continuing to cycle check a echogram.doubt ACS but will need further cardiac   evaluation . Hypotension due to drugs - hold home medications for tonight . Hypokalemia - replace and check magnesium levels Epigastric p[ain possible PUD - will need GI work up, protonix BID order hemoccult stools, hold NSAIDS  Other plan as per orders.  DVT prophylaxis:  SCD   Code Status:  FULL CODE   as per patient    Family Communication:   Family   at  Bedside  plan of care was discussed with   Husband,  Disposition Plan: To home once workup is complete and patient is stable                             Consults called: ONCOLOGY notified through epic. email  Cardiology  Admission status:   obs   Level of care         SDU      I have spent a total of 56 min on this admission   Gerline Ratto 02/19/2017, 1:36 AM    [facility]  Pager [phone]   after 2 AM please page floor coverage PA If 7AM-7PM, please contact the day team taking care of the patient  [email or web address]  Password [email or web address]

## 2017-02-18 NOTE — ED Triage Notes (Signed)
Patient c/o intermittent left chest pain since 2100 last night. Patient also c/o chills and tingling of the left hand. Patient states CP is accompanied by SOB.  Patient states CP is worse when she is ambulating. Patient also c/o headache in the occipital area.

## 2017-02-18 NOTE — ED Provider Notes (Signed)
Emily Santiago   CSN: [id number] Arrival date & time: 02/18/17  1626     History   Chief Complaint Chief Complaint  Patient presents with  . Chest Pain  . Headache    HPI Emily Santiago is a 56 y.o. female.  HPI   56 year old female with history of hypertension, anxiety, lymphoma presenting for evaluation of chest pain.patient reported having intermittent left-sided chest discomfort since yesterday. Describe pain as a achy pressure sensation, occasionally radiates down to her left hand with tingling sensation to her hand, associated shortness of breath, nauseous, skin clammy, and lightheadedness. Symptoms usually lasting for a few minutes each, brought on by exertion. Furthermore, she also developed a occipital headache that she describes as an achy sensation without recent changes. Headache has been ongoing for the past 3 hours. 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 She denies any associated fever, vision changes, confusion, trouble swallowing, productive cough, abdominal pain, dyspnea, focal weakness. No prior history of PE or DVT, no leg swelling or calf pain  Past Medical History:  Diagnosis Date  . Anxiety   . Hypertension   . Lymphoma ([place])   . Varicose veins     Patient Active Problem List   Diagnosis Date Noted  . Chronic fatigue 01/21/2017  . DJD (degenerative joint disease) of cervical spine 10/15/2016  . Dysuria 07/05/2016  . Other constipation 06/22/2016  . Mucositis due to antineoplastic therapy 06/08/2016  . Hypotension 06/03/2016  . Sore throat 05/04/2016  . Dysphagia 04/22/2016  . Gastroesophageal reflux disease 04/22/2016  . 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 Essential hypertension 04/21/2016  .  Tobacco abuse 04/20/2016  . Pain of left lower extremity-Thigh / Leg 02/05/2014  . Varicose veins of lower extremities with other complications 55/73/2202    Past Surgical History:  Procedure Laterality Date  . ABDOMINAL HYSTERECTOMY    . CHOLECYSTECTOMY    . ESOPHAGEAL MANOMETRY N/A 04/28/2016   Procedure: ESOPHAGEAL MANOMETRY (EM);  Surgeon: Mauri Pole, MD;  Location: [facility];  Service: Endoscopy;  Laterality: N/A;  dr. Loletha Carrow  . LYMPH NODE BIOPSY    . WRIST SURGERY Right     OB History    No data available       Home Medications    Prior to Admission medications   Medication Sig Start Date End Date Taking? Authorizing Provider  B Complex Vitamins (B-COMPLEX/B-12) TABS Take 1 tablet by mouth daily.   Yes [provider]  BIOTIN 5000 PO Take 1 capsule by mouth daily.   Yes [provider]  Cholecalciferol (VITAMIN D3) 5000 units CAPS Take 5,000 Units by mouth daily.   Yes [provider]  esomeprazole (NEXIUM) 40 MG capsule Take 1 capsule (40 mg total) by mouth daily. 06/15/16  Yes Gorsuch, Ni, MD  mirtazapine (REMERON) 15 MG tablet Take 1 tablet (15 mg total) by mouth at bedtime. 08/24/16  Yes Heath Lark, MD  Multiple Vitamin (MULTIVITAMIN WITH MINERALS) TABS tablet Take 1 tablet by mouth daily.   Yes [provider]  polyethylene glycol (MIRALAX / GLYCOLAX) packet Take 17 g by mouth every other day.    Yes [provider]  telmisartan (MICARDIS) 40 MG tablet Take 40 mg by mouth daily.  02/09/17  Yes [provider]  magic mouthwash w/lidocaine SOLN Take 5 mLs by mouth 4 (four) times daily. Patient not taking: Reported on 10/10/2016 06/04/16   Susanne Borders, NP  nicotine (NICODERM CQ - DOSED IN MG/24 HOURS) 14 mg/24hr patch Place 1 patch (14 mg total) onto the skin daily. Patient not taking: Reported on 10/14/2016 06/01/16   Heath Lark, MD  ondansetron (ZOFRAN) 8 MG tablet Take 1 tablet (8 mg total) by mouth every  8 (eight) hours as needed for refractory nausea / vomiting. Patient not taking: Reported on 10/14/2016 05/04/16   Heath Lark, MD  oxyCODONE (OXY IR/ROXICODONE) 5 MG immediate release tablet Take 1 tablet (5 mg total) by mouth every 4 (four) hours as needed for severe pain. Patient not taking: Reported on 10/14/2016 07/20/16   Heath Lark, MD  prochlorperazine (COMPAZINE) 10 MG tablet Take 1 tablet (10 mg total) by mouth every 6 (six) hours as needed (Nausea or vomiting). Patient not taking: Reported on 10/14/2016 05/04/16   Heath Lark, MD  valsartan-hydrochlorothiazide (DIOVAN-HCT) 160-25 MG tablet Take 1/2 tab PO Q AM. Patient not taking: Reported on 02/18/2017 06/08/16   Susanne Borders, NP    Family History Family History  Problem Relation Age of Onset  . Heart disease Father   . Colon cancer Father        dx in his 27's  . Heart disease Maternal Grandmother   . Prostate cancer Maternal Uncle   . Brain cancer Brother   . Liver cancer Brother   . Bone cancer Brother   . Prostate cancer Brother     Social History Social History  Substance Use Topics  . Smoking status: Current Every Day Smoker    Packs/day: 1.00    Years: 35.00    Types: Cigarettes  . Smokeless tobacco: Never Used     Comment: form given 04-22-16  . Alcohol use No     Allergies   Lisinopril; Penicillins; and Prednisone   Review of Systems Review of Systems  All other systems reviewed and are negative.    Physical Exam Updated Vital Signs BP (!) 171/105 (BP Location: Left Arm)   Pulse 70   Temp 97.8 F (36.6 C) (Oral)   Resp 18   SpO2 98%   Physical Exam  Constitutional: She is oriented to person, place, and time. She appears well-developed and well-nourished. No distress.  HENT:  Head: Normocephalic and atraumatic.  Eyes: Conjunctivae are normal.  Neck: Normal range of motion. Neck supple.  No nuchal rigidity  Cardiovascular: Normal rate, regular rhythm and intact distal pulses.     Pulmonary/Chest: Effort normal. No respiratory distress. She has no wheezes. She has rales. She exhibits no tenderness.  Abdominal: Soft. Bowel sounds are normal. She exhibits no distension. There is no tenderness.  Musculoskeletal: She exhibits no edema.  Neurological: She is alert and oriented to person, place, and time.  Skin: No rash noted.  Psychiatric: She has a normal mood and affect.  Nursing Santiago and vitals reviewed.    ED Treatments / Results  Labs (all labs ordered are listed, but only abnormal results are displayed) Labs Reviewed  BASIC METABOLIC PANEL - Abnormal; Notable for the following:       Result Value   Potassium 3.3 (*)    All other components within normal limits  CBC  BRAIN NATRIURETIC PEPTIDE  I-STAT TROPONIN, ED  I-STAT TROPONIN, ED    EKG  EKG Interpretation  Date/Time:  Friday February 18 2017 16:38:50 EDT  Ventricular Rate:  75 PR Interval:    QRS Duration: 105 QT Interval:  398 QTC Calculation: 445 R Axis:   74 Text Interpretation:  Sinus rhythm When comapred to prior, no significant changes seen,  No STEMI Confirmed by Antony Blackbird [phone]) on 02/18/2017 6:47:02 PM      Date: 02/18/2017  Rate: 75  Rhythm: normal sinus rhythm  QRS Axis: normal  Intervals: normal  ST/T Wave abnormalities: normal  Conduction Disutrbances: none  Narrative Interpretation:   Old EKG Reviewed: No significant changes noted     Radiology Dg Chest 2 View  Result Date: 02/18/2017 CLINICAL DATA:  Chest pain EXAM: CHEST  2 VIEW COMPARISON:  10/10/2016 FINDINGS: The heart size and mediastinal contours are within normal limits. Both lungs are clear. Stable wedging of vertebra at thoracolumbar junction. IMPRESSION: No active cardiopulmonary disease. Electronically Signed   By: Donavan Foil M.D.   On: 02/18/2017 17:41   Ct Head Wo Contrast  Result Date: 02/18/2017 CLINICAL DATA:  Occipital headache.  Treated lymphoma. EXAM: CT HEAD WITHOUT CONTRAST TECHNIQUE:  Contiguous axial images were obtained from the base of the skull through the vertex without intravenous contrast. COMPARISON:  PET-CT dated 07/13/2016. FINDINGS: Brain: Diffusely enlarged ventricles and subarachnoid spaces. Patchy white matter low density in both cerebral hemispheres. No intracranial hemorrhage, mass lesion or CT evidence of acute infarction. Vascular: No hyperdense vessel or unexpected calcification. Skull: Normal. Negative for fracture or focal lesion. Sinuses/Orbits: Unremarkable. Other: None. IMPRESSION: 1. No acute abnormality. 2. Mild diffuse cerebral and cerebellar atrophy. 3. Minimal chronic small vessel white matter ischemic changes in both cerebral hemispheres. Electronically Signed   By: Claudie Revering M.D.   On: 02/18/2017 18:37    Procedures Procedures (including critical care time)  Medications Ordered in ED Medications  labetalol (NORMODYNE,TRANDATE) injection 10 mg (0 mg Intravenous Hold 02/18/17 2206)  sodium chloride 0.9 % bolus 1,000 mL (not administered)  morphine 4 MG/ML injection 4 mg (4 mg Intravenous Given 02/18/17 1949)  aspirin chewable tablet 324 mg (324 mg Oral Given 02/18/17 1937)  hydrALAZINE (APRESOLINE) injection 10 mg (10 mg Intravenous Given 02/18/17 2052)  morphine 4 MG/ML injection 4 mg (4 mg Intravenous Given 02/18/17 2052)  iopamidol (ISOVUE-370) 76 % injection 100 mL (100 mLs Intravenous Contrast Given 02/18/17 2059)  iopamidol (ISOVUE-370) 76 % injection (  Contrast Given 02/18/17 2119)     Initial Impression / Assessment and Plan / ED Course  I have reviewed the triage vital signs and the nursing notes.  Pertinent labs & imaging results that were available during my care of the patient were reviewed by me and considered in my medical decision making (see chart for details).     BP (!) 100/50 (BP Location: Left Arm)   Pulse 75   Temp 97.8 F (36.6 C) (Oral)   Resp 16   SpO2 93%    Final Clinical Impressions(s) / ED Diagnoses   Final  diagnoses:  Angina pectoris (HCC)  Hypertensive urgency    New Prescriptions New Prescriptions   No medications on file   5:55 PM The patient here with complaints of headache, chest pain and shortness of breath.she does have some risk factor for ACS but no prior cardiac stress test in the past. Her sxs not suggestive of PE.  Work up initiated.   7:01 PM HEART score of 4, moderate risk of MACE.  Pt has elevated BP of 171/105 here, and negative orthostatic vital sign.  BP did increase to 196  systolic upon standing.  Will give hydralazine, check delta trop.  May consider admission for hypertensive urgency and cardiac work up.   8:28 PM Pt with chest pain now having abd pain, report tingling sensation to L arm, she is hypertensive.  She is NVI, intact pulses to all extremities, no abdominal bruits, CXR without widening mediastinum.  However, will obtain chest/abd/pelvis CTA to r/o dissection.  Pain medication given.   10:24 PM Pt was initially hypertensive with BP 975 systolic.  After receiving hydralazine, her bp drops to low 100s, pt became symptomatic.  IVF given.  Pt sts since her cancer diagnosis, she has had trouble with her blood pressure fluctuating. Initially labetalol was ordered, but after pt was found to be hypotensive, it was not given. Appreciate consultation from [facility] Dr. Roel Cluck who agrees to see and admit pt for further management of her condition including chest pain rule out.  Pt still endorse 10/10 pain to her chest.     Domenic Moras, PA-C 02/18/17 2235    Tegeler, Gwenyth Allegra, MD 02/24/17 1014

## 2017-02-19 ENCOUNTER — Observation Stay (HOSPITAL_COMMUNITY): Payer: BLUE CROSS/BLUE SHIELD

## 2017-02-19 ENCOUNTER — Encounter (HOSPITAL_COMMUNITY): Payer: Self-pay | Admitting: Cardiology

## 2017-02-19 DIAGNOSIS — R1013 Epigastric pain: Secondary | ICD-10-CM | POA: Diagnosis present

## 2017-02-19 DIAGNOSIS — K279 Peptic ulcer, site unspecified, unspecified as acute or chronic, without hemorrhage or perforation: Secondary | ICD-10-CM | POA: Diagnosis not present

## 2017-02-19 DIAGNOSIS — R079 Chest pain, unspecified: Secondary | ICD-10-CM | POA: Diagnosis not present

## 2017-02-19 DIAGNOSIS — Z720 Tobacco use: Secondary | ICD-10-CM | POA: Diagnosis not present

## 2017-02-19 DIAGNOSIS — I10 Essential (primary) hypertension: Secondary | ICD-10-CM | POA: Diagnosis not present

## 2017-02-19 LAB — COMPREHENSIVE METABOLIC PANEL
ALT: 58 U/L — ABNORMAL HIGH (ref 14–54)
AST: 92 U/L — ABNORMAL HIGH (ref 15–41)
Albumin: 3.8 g/dL (ref 3.5–5.0)
Alkaline Phosphatase: 80 U/L (ref 38–126)
Anion gap: 8 (ref 5–15)
BUN: 13 mg/dL (ref 6–20)
CO2: 23 mmol/L (ref 22–32)
Calcium: 8.7 mg/dL — ABNORMAL LOW (ref 8.9–10.3)
Chloride: 111 mmol/L (ref 101–111)
Creatinine, Ser: 0.51 mg/dL (ref 0.44–1.00)
GFR calc Af Amer: >60 mL/min (ref >60)
GFR calc non Af Amer: >60 mL/min (ref >60)
Glucose, Bld: 123 mg/dL — ABNORMAL HIGH (ref 65–99)
Potassium: 3.7 mmol/L (ref 3.5–5.1)
Sodium: 142 mmol/L (ref 135–145)
Total Bilirubin: 0.3 mg/dL (ref 0.3–1.2)
Total Protein: 6.5 g/dL (ref 6.5–8.1)

## 2017-02-19 LAB — LIPID PANEL
Cholesterol: 255 mg/dL — ABNORMAL HIGH (ref 0–200)
HDL: 43 mg/dL (ref >40)
LDL Cholesterol: 184 mg/dL — ABNORMAL HIGH (ref 0–99)
Total CHOL/HDL Ratio: 5.9 RATIO
Triglycerides: 138 mg/dL (ref <150)
VLDL: 28 mg/dL (ref 0–40)

## 2017-02-19 LAB — CBC
HCT: 37.9 % (ref 36.0–46.0)
Hemoglobin: 12.9 g/dL (ref 12.0–15.0)
MCH: 31.9 pg (ref 26.0–34.0)
MCHC: 34 g/dL (ref 30.0–36.0)
MCV: 93.8 fL (ref 78.0–100.0)
Platelets: 208 10*3/uL (ref 150–400)
RBC: 4.04 MIL/uL (ref 3.87–5.11)
RDW: 13.4 % (ref 11.5–15.5)
WBC: 11.4 10*3/uL — ABNORMAL HIGH (ref 4.0–10.5)

## 2017-02-19 LAB — LIPASE, BLOOD: Lipase: 18 U/L (ref 11–51)

## 2017-02-19 LAB — ECHOCARDIOGRAM COMPLETE
Height: 61 in
Weight: 2462.1 oz

## 2017-02-19 LAB — PHOSPHORUS: Phosphorus: 3.8 mg/dL (ref 2.5–4.6)

## 2017-02-19 LAB — MRSA PCR SCREENING: MRSA by PCR: NEGATIVE

## 2017-02-19 LAB — TROPONIN I
Troponin I: <0.03 ng/mL (ref <0.03)
Troponin I: <0.03 ng/mL (ref <0.03)
Troponin I: <0.03 ng/mL (ref <0.03)

## 2017-02-19 LAB — MAGNESIUM: Magnesium: 1.8 mg/dL (ref 1.7–2.4)

## 2017-02-19 LAB — HIV ANTIBODY (ROUTINE TESTING W REFLEX): HIV Screen 4th Generation wRfx: NONREACTIVE

## 2017-02-19 LAB — TSH: TSH: 2.284 u[IU]/mL (ref 0.350–4.500)

## 2017-02-19 MED ORDER — FLUTICASONE PROPIONATE 50 MCG/ACT NA SUSP
2.0000 | Freq: Every day | NASAL | Status: DC
  Administered 2017-02-19: 2 via NASAL
  Filled 2017-02-19: qty 16

## 2017-02-19 MED ORDER — HYDRALAZINE HCL 20 MG/ML IJ SOLN
5.0000 mg | INTRAMUSCULAR | Status: DC | PRN
  Administered 2017-02-19: 5 mg via INTRAVENOUS
  Filled 2017-02-19: qty 0.25
  Filled 2017-02-19: qty 1

## 2017-02-19 MED ORDER — IRBESARTAN 75 MG PO TABS
37.5000 mg | ORAL_TABLET | Freq: Every day | ORAL | Status: DC
  Administered 2017-02-19 – 2017-02-20 (×2): 37.5 mg via ORAL
  Filled 2017-02-19: qty 0.5
  Filled 2017-02-19: qty 1

## 2017-02-19 MED ORDER — NICOTINE 14 MG/24HR TD PT24
14.0000 mg | MEDICATED_PATCH | Freq: Every day | TRANSDERMAL | Status: DC
Start: 2017-02-19 — End: 2017-02-20
  Administered 2017-02-19 – 2017-02-20 (×2): 14 mg via TRANSDERMAL
  Filled 2017-02-19 (×2): qty 1

## 2017-02-19 MED ORDER — SUCRALFATE 1 GM/10ML PO SUSP
1.0000 g | Freq: Three times a day (TID) | ORAL | Status: DC
  Administered 2017-02-19 – 2017-02-20 (×5): 1 g via ORAL
  Filled 2017-02-19 (×5): qty 10

## 2017-02-19 NOTE — Progress Notes (Signed)
  Echocardiogram 2D Echocardiogram has been performed.  Emily Santiago Emily Santiago 02/19/2017, 8:38 AM

## 2017-02-19 NOTE — Consult Note (Signed)
Cardiology Consult Note  Admit date: 02/18/2017 Name: Emily Santiago 56 y.o.  female DOB:  1960/12/24 MRN:  [id number]  Today's date:  02/19/2017  Referring Physician:   [facility]  Primary Physician:    Dr. Allie Bossier  Reason for Consultation:    Chest pain  IMPRESSIONS: 1.  Chest pain with atypical features which is somewhat pleuritic in a patient with known lymphoma 2.  Hypertension with accelerated hypertension recently 3.  History of hyperlipidemia 4.  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  Overweight 6.  Atherosclerosis of aorta  RECOMMENDATION: 1.  The chest pain has atypical features and her troponin is negative.  She complained of some pleuritic chest pain earlier but had a negative CT angiogram for pulmonary embolus or dissection.  I would see how she does today and if she is stable would obtain a myocardial perfusion scan tomorrow in light of her multiple risk factors.  If she continues to have chest pain in the hospital I would do a coronary CTA on her Monday. 2.  Discussed importance of cigarette smoking cessation 3.  Restart blood pressure medicines and continue blood pressure control 4.  Repeat lipid panel  HISTORY: This 56 year old female is seen at the request of the hospitalist because of chest pain.  She has a prior history of hypertension as well as tobacco abuse.  She reports that she was recently diagnosed with hyperlipidemia and she had screening labs done and a weight loss clinic with cholesterols greater than 300.  She has been diagnosed with head and neck lymphoma and completed 3 cycles of chemotherapy with CHOP.  She has complained of fatigue since then and has had significant issues with just feeling bad.  She works doing Systems developer care.  3 nights ago she developed somewhat sharp left-sided chest pain that took her breath and caused her to set up that awakened her from sleep.  She has had intermittent chest discomfort  with activity since then that she describes as sharp and that it will catch her breath and it hurts to take a deep breath.  She presented to the emergency room last evening with headache and nauseous skin clammy and an achy sensation with tingling of her left hand sometimes brought on by exertion.  She had a CT scan of her chest and abdomen that showed no dissection, evidence of thoracic aortic atherosclerosis and possible a gastric ulcer on CT scan.  EKG and troponins have been negative since then.  She has had some recurrent chest discomfort described as sharp and pleuritic one occurred when she was receiving an infusion of potassium this morning and was sharp and happened after her arm hurt from receiving the potassium.  She normally denies PND or orthopnea or edema.  She is a smoker and smokes about one to one and a half packs of cigarettes per day.  Past Medical History:  Diagnosis Date  . Anxiety   . Hypertension   . Lymphoma ([place])   . Varicose veins      Past Surgical History:  Procedure Laterality Date  . ABDOMINAL HYSTERECTOMY    . CHOLECYSTECTOMY    . ESOPHAGEAL MANOMETRY N/A 04/28/2016   Procedure: ESOPHAGEAL MANOMETRY (EM);  Surgeon: Mauri Pole, MD;  Location: [facility];  Service: Endoscopy;  Laterality: N/A;  dr. Loletha Carrow  . LYMPH NODE BIOPSY    . WRIST SURGERY Right     Allergies:  is allergic to lisinopril; penicillins; and prednisone.  Medications: Prior to Admission medications   Medication Sig Start Date End Date Taking? Authorizing Provider  B Complex Vitamins (B-COMPLEX/B-12) TABS Take 1 tablet by mouth daily.   Yes [provider]  BIOTIN 5000 PO Take 1 capsule by mouth daily.   Yes [provider]  Cholecalciferol (VITAMIN D3) 5000 units CAPS Take 5,000 Units by mouth daily.   Yes [provider]  esomeprazole (NEXIUM) 40 MG capsule Take 1 capsule (40 mg total) by mouth daily. 06/15/16  Yes Gorsuch, Ni, MD  mirtazapine (REMERON) 15  MG tablet Take 1 tablet (15 mg total) by mouth at bedtime. 08/24/16  Yes Heath Lark, MD  Multiple Vitamin (MULTIVITAMIN WITH MINERALS) TABS tablet Take 1 tablet by mouth daily.   Yes [provider]  polyethylene glycol (MIRALAX / GLYCOLAX) packet Take 17 g by mouth every other day.    Yes [provider]  telmisartan (MICARDIS) 40 MG tablet Take 40 mg by mouth daily.  02/09/17  Yes [provider]  magic mouthwash w/lidocaine SOLN Take 5 mLs by mouth 4 (four) times daily. Patient not taking: Reported on 10/10/2016 06/04/16   Susanne Borders, NP  nicotine (NICODERM CQ - DOSED IN MG/24 HOURS) 14 mg/24hr patch Place 1 patch (14 mg total) onto the skin daily. Patient not taking: Reported on 10/14/2016 06/01/16   Heath Lark, MD  ondansetron (ZOFRAN) 8 MG tablet Take 1 tablet (8 mg total) by mouth every 8 (eight) hours as needed for refractory nausea / vomiting. Patient not taking: Reported on 10/14/2016 05/04/16   Heath Lark, MD  oxyCODONE (OXY IR/ROXICODONE) 5 MG immediate release tablet Take 1 tablet (5 mg total) by mouth every 4 (four) hours as needed for severe pain. Patient not taking: Reported on 10/14/2016 07/20/16   Heath Lark, MD  prochlorperazine (COMPAZINE) 10 MG tablet Take 1 tablet (10 mg total) by mouth every 6 (six) hours as needed (Nausea or vomiting). Patient not taking: Reported on 10/14/2016 05/04/16   Heath Lark, MD  valsartan-hydrochlorothiazide (DIOVAN-HCT) 160-25 MG tablet Take 1/2 tab PO Q AM. Patient not taking: Reported on 02/18/2017 06/08/16   Susanne Borders, NP    Family History: Family Status  Relation Status  . Father Deceased  . MGM (Not Specified)  . Brother Alive  . Mat Uncle (Not Specified)  . Brother Alive  . Mother Alive   Social History:   reports that she has been smoking Cigarettes.  She has a 35.00 pack-year smoking history. She has never used smokeless tobacco. She reports that she does not drink alcohol or use drugs.   She has  been married 3 times previously and has been married to her current husband since 2015.  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  Wears eyeglasses.  Complains of mild shortness of breath.  She has had a history of difficulty swallowing has a history of gastric ulcer disease in the past.  She has been using a lot of NSAIDs recently.  Complains of arthritis involving her fingers.  Complains of some mild urinary incontinence as well as frequent urinary tract infections.  Significant malaise and fatigue.  Complains of some pain in her lower back.  Other than as noted above remainder of the review of systems is unremarkable.  Physical Exam: BP (!) 146/82 (BP Location: Left Arm)   Pulse 78   Temp 97.8 F (36.6 C) (Oral)   Resp 10   Ht 5\' 1"  (1.549 m)  Wt 69.8 kg (153 lb 14.1 oz)   SpO2 97%   BMI 29.08 kg/m   General appearance: She is a mildly obese female in no acute distress Head: Normocephalic, without obvious abnormality, atraumatic Eyes: conjunctivae/corneas clear. PERRL, EOM's intact. Fundi not examined Neck: no adenopathy, no carotid bruit, no JVD and supple, symmetrical, trachea midline Lungs: clear to auscultation bilaterally Heart: regular rate and rhythm, S1, S2 normal, no murmur, click, rub or gallop Abdomen: soft, non-tender; bowel sounds normal; no masses,  no organomegaly Pelvic: deferred Extremities: extremities normal, atraumatic, no cyanosis or edema Pulses: 2+ and symmetric Skin: Skin color, texture, turgor normal. No rashes or lesions Neurologic: Grossly normal Psych: Alert and oriented x 3 Labs: CBC  Recent Labs  02/19/17 0123  WBC 11.4*  RBC 4.04  HGB 12.9  HCT 37.9  PLT 208  MCV 93.8  MCH 31.9  MCHC 34.0  RDW 13.4   CMP   Recent Labs  02/19/17 0123  NA 142  K 3.7  CL 111  CO2 23  GLUCOSE 123*  BUN 13  CREATININE 0.51  CALCIUM 8.7*  PROT 6.5  ALBUMIN 3.8  AST 92*  ALT 58*  ALKPHOS 80  BILITOT 0.3   GFRNONAA >60  GFRAA >60   BNP (last 3 results) BNP    Component Value Date/Time   BNP 16.5 02/18/2017 1659   Cardiac Panel (last 3 results) Troponin (Point of Care Test)  Recent Labs  02/18/17 1953  TROPIPOC 0.00   Cardiac Panel (last 3 results)  Recent Labs  02/19/17 0123 02/19/17 0723  TROPONINI <0.03 <0.03     Radiology:  Chest x-ray shows clear lungs and heart.  Stable wedging of the thoracic or lumbar vertebra. CT scan showed some scattered calcifications in the aorta.  There was a suggestion of a possible gastric ulcer.  No adenopathy.  Mild fibrosis in ungs.  EKG: Normal Independently reviewed by me   Signed:  W. Doristine Church MD [facility]   Cardiology Consultant  02/19/2017, 9:24 AM \

## 2017-02-19 NOTE — Progress Notes (Signed)
[facility] PROGRESS NOTE  Emily Santiago CBJ:[id number] DOB: [birth year] DOA: 02/18/2017  PCP: Velna Hatchet, MD  Brief History/Interval Summary: 56 year old Caucasian female with a past medical history of follicular lymphoma in remission, hypertension, fibromyalgia, chronic fatigue syndrome, history of gastritis with peptic ulcer disease with last upper endoscopy in November 2017. She presented with intermittent left-sided chest pain starting 1 day prior to admission. She also had some difficulty breathing. CT angiogram of the chest was negative for PE. She does mention severe symptoms of acid reflux. She also had upper abdominal discomfort.  Reason for Visit: Chest pain.  Consultants: Cardiology  Procedures:  Transthoracic echocardiogram Study Conclusions  - Left ventricle: The cavity size was normal. Systolic function was   normal. The estimated ejection fraction was in the range of 55%   to 60%. Wall motion was normal; there were no regional wall   motion abnormalities.  Antibiotics: None  Subjective/Interval History: Patient continues to complain of discomfort in the left side of her chest. Not as bad as yesterday. Denies any difficulty breathing. She admits to taking large amounts of Goody's powder for headaches. No nausea, vomiting.  ROS: No discomfort with urinating  Objective:  Vital Signs  Vitals:   02/19/17 0421 02/19/17 0500 02/19/17 0755 02/19/17 0837  BP:  120/74  (!) 146/82  Pulse:      Resp:  10    Temp: (!) 97.3 F (36.3 C)  97.8 F (36.6 C)   TempSrc: Oral  Oral   SpO2:  94%  97%  Weight:      Height:        Intake/Output Summary (Last 24 hours) at 02/19/17 1039 Last data filed at 02/19/17 0745  Gross per 24 hour  Intake             1425 ml  Output              300 ml  Net             1125 ml   Filed Weights   02/19/17 0100  Weight: 69.8 kg (153 lb 14.1 oz)    General appearance: alert, cooperative, appears stated age and no  distress Head: Normocephalic, without obvious abnormality, atraumatic Resp: clear to auscultation bilaterally Cardio: regular rate and rhythm, S1, S2 normal, no murmur, click, rub or gallop GI: Abdomen soft. Mildly tender in the epigastric area without any rebound, rigidity or guarding. No masses or organomegaly. Bowel sounds are present. Extremities: extremities normal, atraumatic, no cyanosis or edema Neurologic: No focal deficits  Lab Results:  Data Reviewed: I have personally reviewed following labs and imaging studies  CBC:  Recent Labs Lab 02/18/17 1659 02/19/17 0123  WBC 6.7 11.4*  HGB 13.7 12.9  HCT 39.8 37.9  MCV 93.4 93.8  PLT 223 160    Basic Metabolic Panel:  Recent Labs Lab 02/18/17 1659 02/19/17 0123  NA 137 142  K 3.3* 3.7  CL 101 111  CO2 23 23  GLUCOSE 80 123*  BUN 16 13  CREATININE 0.55 0.51  CALCIUM 9.2 8.7*  MG  --  1.8  PHOS  --  3.8    GFR: Estimated Creatinine Clearance: 69.3 mL/min (by C-G formula based on SCr of 0.51 mg/dL).  Liver Function Tests:  Recent Labs Lab 02/19/17 0123  AST 92*  ALT 58*  ALKPHOS 80  BILITOT 0.3  PROT 6.5  ALBUMIN 3.8     Recent Labs Lab 02/19/17 0723  LIPASE 18  Cardiac Enzymes:  Recent Labs Lab 02/19/17 0123 02/19/17 0723  TROPONINI <0.03 <0.03     Lipid Profile:  Recent Labs  02/19/17 0123  CHOL 255*  HDL 43  LDLCALC 184*  TRIG 138  CHOLHDL 5.9    Thyroid Function Tests:  Recent Labs  02/19/17 0123  TSH 2.284     Recent Results (from the past 240 hour(s))  MRSA PCR Screening     Status: None   Collection Time: 02/19/17 12:20 AM  Result Value Ref Range Status   MRSA by PCR NEGATIVE NEGATIVE Final    Comment:        The GeneXpert MRSA Assay (FDA approved for NASAL specimens only), is one component of a comprehensive MRSA colonization surveillance program. It is not intended to diagnose MRSA infection nor to guide or monitor treatment for MRSA  infections.       Radiology Studies: Dg Chest 2 View  Result Date: 02/18/2017 CLINICAL DATA:  Chest pain EXAM: CHEST  2 VIEW COMPARISON:  10/10/2016 FINDINGS: The heart size and mediastinal contours are within normal limits. Both lungs are clear. Stable wedging of vertebra at thoracolumbar junction. IMPRESSION: No active cardiopulmonary disease. Electronically Signed   By: Donavan Foil M.D.   On: 02/18/2017 17:41   Ct Head Wo Contrast  Result Date: 02/18/2017 CLINICAL DATA:  Occipital headache.  Treated lymphoma. EXAM: CT HEAD WITHOUT CONTRAST TECHNIQUE: Contiguous axial images were obtained from the base of the skull through the vertex without intravenous contrast. COMPARISON:  PET-CT dated 07/13/2016. FINDINGS: Brain: Diffusely enlarged ventricles and subarachnoid spaces. Patchy white matter low density in both cerebral hemispheres. No intracranial hemorrhage, mass lesion or CT evidence of acute infarction. Vascular: No hyperdense vessel or unexpected calcification. Skull: Normal. Negative for fracture or focal lesion. Sinuses/Orbits: Unremarkable. Other: None. IMPRESSION: 1. No acute abnormality. 2. Mild diffuse cerebral and cerebellar atrophy. 3. Minimal chronic small vessel white matter ischemic changes in both cerebral hemispheres. Electronically Signed   By: Claudie Revering M.D.   On: 02/18/2017 18:37   Ct Angio Chest/abd/pel For Dissection W And/or Wo Contrast  Result Date: 02/18/2017 CLINICAL DATA:  Intermittent left chest pain since last night. Chills and tingling to the left hand. Shortness of breath. Chest pain worse with ambulation. Headache. History of lymphoma. EXAM: CT ANGIOGRAPHY CHEST, ABDOMEN AND PELVIS TECHNIQUE: Multidetector CT imaging through the chest, abdomen and pelvis was performed using the standard protocol during bolus administration of intravenous contrast. Multiplanar reconstructed images and MIPs were obtained and reviewed to evaluate the vascular anatomy. CONTRAST:  100  mL Isovue 370 COMPARISON:  CT chest lung cancer screening protocol 09/15/2015 FINDINGS: CTA CHEST FINDINGS Cardiovascular: Noncontrast images of the chest demonstrate scattered calcification in the aortic valve and in the thoracic aorta. No evidence of intramural hematoma. Images obtained during arterial phase of IV contrast administration demonstrate patent thoracic aorta and great vessels. No evidence of aneurysm or dissection. Motion artifact at the aortic root. Central pulmonary arteries are well opacified without evidence of significant pulmonary embolus. Normal heart size. No pericardial effusions. Mediastinum/Nodes: Small esophageal hiatal hernia. Esophagus is decompressed. No significant lymphadenopathy in the chest. Thyroid gland is unremarkable. Lungs/Pleura: Mild emphysematous changes in the lungs most prominent at the apices. Slight fibrosis or dependent change in the lung bases. No airspace disease or consolidation. No pleural effusions. No pneumothorax. Airways are patent. Musculoskeletal: Degenerative changes in the spine. No destructive bone lesions. Review of the MIP images confirms the above findings. CTA ABDOMEN AND PELVIS  FINDINGS VASCULAR Aorta: Normal caliber abdominal aorta. No evidence of aneurysm or dissection. Scattered mild calcific and noncalcific atherosclerotic changes. Celiac: Patent without evidence of aneurysm, dissection, vasculitis or significant stenosis. SMA: Patent without evidence of aneurysm, dissection, vasculitis or significant stenosis. Renals: Single right renal artery with early bifurcation. Duplicated left renal artery. Renal arteries are patent bilaterally without evidence of aneurysm or dissection. Renal nephrograms are symmetrical. IMA: Patent without evidence of aneurysm, dissection, vasculitis or significant stenosis. Inflow: Patent without evidence of aneurysm, dissection, vasculitis or significant stenosis. Veins: No obvious venous abnormality within the  limitations of this arterial phase study. Review of the MIP images confirms the above findings. NON-VASCULAR Hepatobiliary: Mild diffuse fatty infiltration of the liver. No focal lesions identified. Surgical absence of the gallbladder. No bile duct dilatation. Pancreas: Unremarkable. No pancreatic ductal dilatation or surrounding inflammatory changes. Spleen: Normal in size without focal abnormality. Adrenals/Urinary Tract: Adrenal glands are unremarkable. Kidneys are normal, without renal calculi, focal lesion, or hydronephrosis. Bladder is unremarkable. Stomach/Bowel: Stomach is mostly nondistended. There is suggestion of possible focal gastric wall thickening at the pyloric region with a tiny gas bubble within the wall. This may indicate a gastric ulcer. No evidence of perforation. Small bowel are mostly decompressed. Scattered stool throughout the colon without distention or wall thickening. Scattered diverticula in the colon. No evidence of diverticulitis. Appendix is not identified. Lymphatic: No significant lymphadenopathy. Reproductive: Status post hysterectomy. No adnexal masses. Other: No free air or free fluid in the abdomen. Abdominal wall musculature appears intact. Musculoskeletal: Degenerative changes in the spine. No destructive bone lesions or acute changes demonstrated. Review of the MIP images confirms the above findings. IMPRESSION: 1. No evidence of aneurysm or dissection of the thoracic or abdominal aorta. 2. Minimal aortic atherosclerosis. 3. Possible small gastric ulcer in the pyloric region. No perforation. 4. Fatty infiltration of the liver. Electronically Signed   By: Lucienne Capers M.D.   On: 02/18/2017 21:39     Medications:  Scheduled: . fluticasone  2 spray Each Nare Daily  . nicotine  14 mg Transdermal Daily  . pantoprazole  40 mg Oral BID  . polyethylene glycol  17 g Oral QODAY  . sucralfate  1 g Oral TID WC & HS   Continuous:  JYN:[id number] **OR** acetaminophen,  HYDROcodone-acetaminophen, ondansetron **OR** ondansetron (ZOFRAN) IV, zolpidem  Assessment/Plan:  Active Problems:   Tobacco abuse   Follicular lymphoma grade iiia, lymph nodes of head, face, and neck (HCC)   Essential hypertension   Chest pain   Hypotension due to drugs   Hypokalemia   Epigastric pain    Chest pain Atypical. EKG did not show any ischemic changes. Troponins have been normal. CT angiogram was negative for PE. Echocardiogram does not show any wall motion abnormalities. Patient seen by cardiology and they have ordered a stress test for tomorrow. Symptoms could be GI in origin. Continue PPI. Add Carafate.  History of peptic ulcer disease. Patient consumes a lot of Goody's powder per her husband. She has known gastric ulcers based on upper endoscopy from last year. She does have severe symptoms of acid reflux, not controlled with once a day PPI. PPI has been increased to twice a day. Add Carafate for now. She's been told to discontinue use of Goody's powder. Lipase level normal. Epigastric pain most likely due to gastritis.  Essential hypertension. Blood pressure is reasonably well controlled today. Elevated blood pressure could've been due to pain and discomfort. Continue to monitor.  Tobacco abuse. Nicotine patch.  History of follicular lymphoma. This is in remission. She is followed at [facility] by Dr. Alvy Bimler.  Hyperlipidemia. LDL noted to be elevated at 184. Consider statin treatment, but hold off for now due to elevated LFTs.  Mild transaminitis noted She does not have any right upper quadrant tenderness. She is status post cholecystectomy. Fatty infiltration of liver noted on CT scan. Check hepatitis panel. Repeat LFTs tomorrow.   DVT Prophylaxis: SCDs    Code Status: Full code  Family Communication: Discussed with the patient and her husband  Disposition Plan: Management as outlined above.    LOS: 0 days   [place] Pager [phone] 02/19/2017, 10:39 AM  If 7PM-7AM, please contact night-coverage at [email or web address], password [facility]

## 2017-02-20 ENCOUNTER — Ambulatory Visit (HOSPITAL_BASED_OUTPATIENT_CLINIC_OR_DEPARTMENT_OTHER)
Admit: 2017-02-20 | Discharge: 2017-02-20 | Disposition: A | Discharge status: Home / Self Care | Payer: BLUE CROSS/BLUE SHIELD | Source: Home / Self Care | Attending: Internal Medicine | Admitting: Internal Medicine

## 2017-02-20 DIAGNOSIS — R079 Chest pain, unspecified: Secondary | ICD-10-CM | POA: Diagnosis not present

## 2017-02-20 DIAGNOSIS — I10 Essential (primary) hypertension: Secondary | ICD-10-CM

## 2017-02-20 LAB — BASIC METABOLIC PANEL
Anion gap: 6 (ref 5–15)
BUN: 11 mg/dL (ref 6–20)
CO2: 28 mmol/L (ref 22–32)
Calcium: 9.1 mg/dL (ref 8.9–10.3)
Chloride: 106 mmol/L (ref 101–111)
Creatinine, Ser: 0.46 mg/dL (ref 0.44–1.00)
GFR calc Af Amer: >60 mL/min (ref >60)
GFR calc non Af Amer: >60 mL/min (ref >60)
Glucose, Bld: 89 mg/dL (ref 65–99)
Potassium: 4 mmol/L (ref 3.5–5.1)
Sodium: 140 mmol/L (ref 135–145)

## 2017-02-20 LAB — CBC
HCT: 41.5 % (ref 36.0–46.0)
Hemoglobin: 13.6 g/dL (ref 12.0–15.0)
MCH: 31.3 pg (ref 26.0–34.0)
MCHC: 32.8 g/dL (ref 30.0–36.0)
MCV: 95.6 fL (ref 78.0–100.0)
Platelets: 200 10*3/uL (ref 150–400)
RBC: 4.34 MIL/uL (ref 3.87–5.11)
RDW: 13.2 % (ref 11.5–15.5)
WBC: 5 10*3/uL (ref 4.0–10.5)

## 2017-02-20 LAB — NM MYOCAR MULTI W/SPECT W/WALL MOTION / EF
Estimated workload: 1 METS
Exercise duration (min): 0 min
Exercise duration (sec): 0 s
MPHR: 163 {beats}/min
Peak HR: 120 {beats}/min
Percent HR: 73 %
RPE: 0
Rest HR: 71 {beats}/min

## 2017-02-20 MED ORDER — SUCRALFATE 1 GM/10ML PO SUSP: 1.0000 g | Freq: Three times a day (TID) | ORAL | 0 refills | Status: DC

## 2017-02-20 MED ORDER — NICOTINE 14 MG/24HR TD PT24: 14.0000 mg | MEDICATED_PATCH | Freq: Every day | TRANSDERMAL | 0 refills | Status: DC

## 2017-02-20 MED ORDER — TECHNETIUM TC 99M TETROFOSMIN IV KIT
30.0000 | PACK | Freq: Once | INTRAVENOUS | Status: AC | PRN
  Administered 2017-02-20: 30 via INTRAVENOUS

## 2017-02-20 MED ORDER — FLUTICASONE PROPIONATE 50 MCG/ACT NA SUSP: 2.0000 | Freq: Every day | NASAL | 2 refills | Status: DC

## 2017-02-20 MED ORDER — TECHNETIUM TC 99M TETROFOSMIN IV KIT
9.7000 | PACK | Freq: Once | INTRAVENOUS | Status: AC | PRN
  Administered 2017-02-20: 9.7 via INTRAVENOUS

## 2017-02-20 MED ORDER — HYDRALAZINE HCL 20 MG/ML IJ SOLN
INTRAMUSCULAR | Status: AC
  Filled 2017-02-20: qty 1

## 2017-02-20 MED ORDER — AMINOPHYLLINE 25 MG/ML IV (NUC MED)
75.0000 mg | Freq: Once | INTRAVENOUS | Status: AC
  Administered 2017-02-20: 75 mg via INTRAVENOUS

## 2017-02-20 MED ORDER — AMINOPHYLLINE 25 MG/ML IV SOLN
INTRAVENOUS | Status: AC
  Filled 2017-02-20: qty 10

## 2017-02-20 MED ORDER — AMLODIPINE BESYLATE 5 MG PO TABS
5.0000 mg | ORAL_TABLET | Freq: Every day | ORAL | 0 refills | Status: DC
Start: 2017-02-20 — End: 2017-04-22

## 2017-02-20 MED ORDER — ESOMEPRAZOLE MAGNESIUM 40 MG PO CPDR: DELAYED_RELEASE_CAPSULE | ORAL | 0 refills | Status: DC

## 2017-02-20 MED ORDER — REGADENOSON 0.4 MG/5ML IV SOLN
INTRAVENOUS | Status: AC
  Filled 2017-02-20: qty 5

## 2017-02-20 MED ORDER — REGADENOSON 0.4 MG/5ML IV SOLN
0.4000 mg | Freq: Once | INTRAVENOUS | Status: AC
  Administered 2017-02-20: 0.4 mg via INTRAVENOUS

## 2017-02-20 MED ORDER — HYDRALAZINE HCL 20 MG/ML IJ SOLN
10.0000 mg | Freq: Once | INTRAMUSCULAR | Status: AC
  Administered 2017-02-20: 10 mg via INTRAVENOUS

## 2017-02-20 NOTE — Progress Notes (Signed)
nuc results back and neg for ischemia, Dr. Wynonia Lawman reviewed OK for discharge ,  No need for cardiology follow up.

## 2017-02-20 NOTE — Progress Notes (Signed)
Lexiscan portion done + SOB and abd pain.  Aminophylline give.  BP very high 200/100 IV Hydralazine given.  PRN order  Nuc results to follow

## 2017-02-20 NOTE — Discharge Summary (Signed)
[facility]  Physician Discharge Summary   Patient ID: Emily Santiago MRN: [id number] DOB/AGE: 1960-12-20 56 y.o.  Admit date: 02/18/2017 Discharge date: 02/20/2017  PCP: Velna Hatchet, MD  DISCHARGE DIAGNOSES:  Active Problems:   Tobacco abuse   Follicular lymphoma grade iiia, lymph nodes of head, face, and neck (HCC)   Essential hypertension   Chest pain   Hypotension due to drugs   Hypokalemia   Epigastric pain   RECOMMENDATIONS FOR OUTPATIENT FOLLOW UP: 1. Outpatient follow-up with PCP this week for further management of hypertension 2. Will need LFT's to be checked as outpatient  3. Hepatitis panel is pending. 4. Consider statin depending on LFT's.   DISCHARGE CONDITION: fair  Diet recommendation: Low sodium  Filed Weights   02/19/17 0100  Weight: 69.8 kg (153 lb 14.1 oz)    INITIAL HISTORY: 56 year old Caucasian female with a past medical history of follicular lymphoma in remission, hypertension, fibromyalgia, chronic fatigue syndrome, history of gastritis with peptic ulcer disease with last upper endoscopy in November 2017. She presented with intermittent left-sided chest pain starting 1 day prior to admission. She also had some difficulty breathing. CT angiogram of the chest was negative for PE. She does mention severe symptoms of acid reflux. She also had upper abdominal discomfort.  Consultants: Cardiology  Procedures:  Transthoracic echocardiogram Study Conclusions  - Left ventricle: The cavity size was normal. Systolic function was normal. The estimated ejection fraction was in the range of 55% to 60%. Wall motion was normal; there were no regional wall motion abnormalities.   HOSPITAL COURSE:   Chest pain Symptoms were atypical. EKG did not show any ischemic changes. Troponins have been normal. CT angiogram was negative for PE. Echocardiogram does not show any wall motion abnormalities. Patient seen by cardiology and stress test was  ordered. This was done today and is a low-risk study. Cleared by cardiology for discharge. Symptoms most likely due to gastritis and acid reflux. Patient consumes a lot of Goody's powder. She's been asked to refrain from using NSAIDs and Goody's powder. Twice daily PPI for next 2 weeks. Carafate for a few days.   History of peptic ulcer disease. Patient consumes a lot of Goody's powder per her husband. She has known gastric ulcers based on upper endoscopy from last year. She does have severe symptoms of acid reflux, not controlled with once a day PPI. PPI has been increased to twice a day. Added Carafate for now. She's been told to discontinue use of Goody's powder. Lipase level normal. Epigastric pain most likely due to gastritis.  Essential hypertension. The pressure noted to be fluctuating. Apparently she was recently switched over from Benicar to Telmisartan about 3 days prior to hospitalization. She should continue taking this medication. She'll be given a prescription for amlodipine, but not to take till she has seen her PCP. She has an appointment to see her PCP on Tuesday. She's been told to keep that appointment and then discuss further management of high blood pressure with her PCP.   Tobacco abuse. Counseled. Nicotine patch.  History of follicular lymphoma. This is in remission. She is followed at the [facility] by Dr. Alvy Bimler.  Hyperlipidemia. LDL noted to be elevated at 184. Consider statin treatment, but hold off for now due to elevated LFTs.  Mild transaminitis She does not have any right upper quadrant tenderness. She is status post cholecystectomy. Fatty infiltration of liver noted on CT scan. Hepatitis panel is pending. Repeat LFT's as outpatient.  Stable for discharge. Discussed in detail with patient and her husband.    PERTINENT LABS:  The results of significant diagnostics from this hospitalization (including imaging, microbiology, ancillary and  laboratory) are listed below for reference.    Microbiology: Recent Results (from the past 240 hour(s))  MRSA PCR Screening     Status: None   Collection Time: 02/19/17 12:20 AM  Result Value Ref Range Status   MRSA by PCR NEGATIVE NEGATIVE Final    Comment:        The GeneXpert MRSA Assay (FDA approved for NASAL specimens only), is one component of a comprehensive MRSA colonization surveillance program. It is not intended to diagnose MRSA infection nor to guide or monitor treatment for MRSA infections.      Labs: Basic Metabolic Panel:  Recent Labs Lab 02/18/17 1659 02/19/17 0123 02/20/17 0532  NA 137 142 140  K 3.3* 3.7 4.0  CL 101 111 106  CO2 23 23 28   GLUCOSE 80 123* 89  BUN 16 13 11   CREATININE 0.55 0.51 0.46  CALCIUM 9.2 8.7* 9.1  MG  --  1.8  --   PHOS  --  3.8  --    Liver Function Tests:  Recent Labs Lab 02/19/17 0123  AST 92*  ALT 58*  ALKPHOS 80  BILITOT 0.3  PROT 6.5  ALBUMIN 3.8    Recent Labs Lab 02/19/17 0723  LIPASE 18   CBC:  Recent Labs Lab 02/18/17 1659 02/19/17 0123 02/20/17 0532  WBC 6.7 11.4* 5.0  HGB 13.7 12.9 13.6  HCT 39.8 37.9 41.5  MCV 93.4 93.8 95.6  PLT 223 208 200   Cardiac Enzymes:  Recent Labs Lab 02/19/17 0123 02/19/17 0723 02/19/17 1304  TROPONINI <0.03 <0.03 <0.03   BNP: BNP (last 3 results)  Recent Labs  02/18/17 1659  BNP 16.5     IMAGING STUDIES Dg Chest 2 View  Result Date: 02/18/2017 CLINICAL DATA:  Chest pain EXAM: CHEST  2 VIEW COMPARISON:  10/10/2016 FINDINGS: The heart size and mediastinal contours are within normal limits. Both lungs are clear. Stable wedging of vertebra at thoracolumbar junction. IMPRESSION: No active cardiopulmonary disease. Electronically Signed   By: Donavan Foil M.D.   On: 02/18/2017 17:41   Ct Head Wo Contrast  Result Date: 02/18/2017 CLINICAL DATA:  Occipital headache.  Treated lymphoma. EXAM: CT HEAD WITHOUT CONTRAST TECHNIQUE: Contiguous axial images  were obtained from the base of the skull through the vertex without intravenous contrast. COMPARISON:  PET-CT dated 07/13/2016. FINDINGS: Brain: Diffusely enlarged ventricles and subarachnoid spaces. Patchy white matter low density in both cerebral hemispheres. No intracranial hemorrhage, mass lesion or CT evidence of acute infarction. Vascular: No hyperdense vessel or unexpected calcification. Skull: Normal. Negative for fracture or focal lesion. Sinuses/Orbits: Unremarkable. Other: None. IMPRESSION: 1. No acute abnormality. 2. Mild diffuse cerebral and cerebellar atrophy. 3. Minimal chronic small vessel white matter ischemic changes in both cerebral hemispheres. Electronically Signed   By: Claudie Revering M.D.   On: 02/18/2017 18:37   Nm Myocar Multi W/spect W/wall Motion / Ef  Result Date: 02/20/2017 CLINICAL DATA:  56 year old female with chest pain an a normal EKG. EXAM: MYOCARDIAL IMAGING WITH SPECT (REST AND PHARMACOLOGIC-STRESS) GATED LEFT VENTRICULAR WALL MOTION STUDY LEFT VENTRICULAR EJECTION FRACTION TECHNIQUE: Standard myocardial SPECT imaging was performed after resting intravenous injection of 10 mCi Tc-54m tetrofosmin. Subsequently, intravenous infusion of Lexiscan was performed under the supervision of the Cardiology staff. At peak effect of the drug, CT scan  of the chest 02/18/2017 mCi Tc-55m tetrofosmin was injected intravenously and standard myocardial SPECT imaging was performed. Quantitative gated imaging was also performed to evaluate left ventricular wall motion, and estimate left ventricular ejection fraction. COMPARISON:  None. FINDINGS: Perfusion: Node decreased activity in the left ventricle on stress only imaging to suggest reversible ischemia. There is a small fixed defect at the true cardiac apex consistent with a region of prior infarct/ scarring. Wall Motion: Normal left ventricular wall motion. No left ventricular dilation. Left Ventricular Ejection Fraction: 62 % End diastolic  volume 83 ml End systolic volume 32 ml IMPRESSION: 1. No evidence of reversible ischemia. Small infarct or scar at the true ventricular apex. 2. Normal left ventricular wall motion. 3. Left ventricular ejection fraction 62% 4. Non invasive risk stratification*: Low *2012 Appropriate Use Criteria for Coronary Revascularization Focused Update: J Am Coll Cardiol. 6387;56(4):332-951. http://content.airportbarriers.com.aspx?articleid=[email or web address] Electronically Signed   By: Jacqulynn Cadet M.D.   On: 02/20/2017 13:29   Ct Angio Chest/abd/pel For Dissection W And/or Wo Contrast  Result Date: 02/18/2017 CLINICAL DATA:  Intermittent left chest pain since last night. Chills and tingling to the left hand. Shortness of breath. Chest pain worse with ambulation. Headache. History of lymphoma. EXAM: CT ANGIOGRAPHY CHEST, ABDOMEN AND PELVIS TECHNIQUE: Multidetector CT imaging through the chest, abdomen and pelvis was performed using the standard protocol during bolus administration of intravenous contrast. Multiplanar reconstructed images and MIPs were obtained and reviewed to evaluate the vascular anatomy. CONTRAST:  100 mL Isovue 370 COMPARISON:  CT chest lung cancer screening protocol 09/15/2015 FINDINGS: CTA CHEST FINDINGS Cardiovascular: Noncontrast images of the chest demonstrate scattered calcification in the aortic valve and in the thoracic aorta. No evidence of intramural hematoma. Images obtained during arterial phase of IV contrast administration demonstrate patent thoracic aorta and great vessels. No evidence of aneurysm or dissection. Motion artifact at the aortic root. Central pulmonary arteries are well opacified without evidence of significant pulmonary embolus. Normal heart size. No pericardial effusions. Mediastinum/Nodes: Small esophageal hiatal hernia. Esophagus is decompressed. No significant lymphadenopathy in the chest. Thyroid gland is unremarkable. Lungs/Pleura: Mild emphysematous changes in the lungs  most prominent at the apices. Slight fibrosis or dependent change in the lung bases. No airspace disease or consolidation. No pleural effusions. No pneumothorax. Airways are patent. Musculoskeletal: Degenerative changes in the spine. No destructive bone lesions. Review of the MIP images confirms the above findings. CTA ABDOMEN AND PELVIS FINDINGS VASCULAR Aorta: Normal caliber abdominal aorta. No evidence of aneurysm or dissection. Scattered mild calcific and noncalcific atherosclerotic changes. Celiac: Patent without evidence of aneurysm, dissection, vasculitis or significant stenosis. SMA: Patent without evidence of aneurysm, dissection, vasculitis or significant stenosis. Renals: Single right renal artery with early bifurcation. Duplicated left renal artery. Renal arteries are patent bilaterally without evidence of aneurysm or dissection. Renal nephrograms are symmetrical. IMA: Patent without evidence of aneurysm, dissection, vasculitis or significant stenosis. Inflow: Patent without evidence of aneurysm, dissection, vasculitis or significant stenosis. Veins: No obvious venous abnormality within the limitations of this arterial phase study. Review of the MIP images confirms the above findings. NON-VASCULAR Hepatobiliary: Mild diffuse fatty infiltration of the liver. No focal lesions identified. Surgical absence of the gallbladder. No bile duct dilatation. Pancreas: Unremarkable. No pancreatic ductal dilatation or surrounding inflammatory changes. Spleen: Normal in size without focal abnormality. Adrenals/Urinary Tract: Adrenal glands are unremarkable. Kidneys are normal, without renal calculi, focal lesion, or hydronephrosis. Bladder is unremarkable. Stomach/Bowel: Stomach is mostly nondistended. There is suggestion of possible focal gastric wall  thickening at the pyloric region with a tiny gas bubble within the wall. This may indicate a gastric ulcer. No evidence of perforation. Small bowel are mostly  decompressed. Scattered stool throughout the colon without distention or wall thickening. Scattered diverticula in the colon. No evidence of diverticulitis. Appendix is not identified. Lymphatic: No significant lymphadenopathy. Reproductive: Status post hysterectomy. No adnexal masses. Other: No free air or free fluid in the abdomen. Abdominal wall musculature appears intact. Musculoskeletal: Degenerative changes in the spine. No destructive bone lesions or acute changes demonstrated. Review of the MIP images confirms the above findings. IMPRESSION: 1. No evidence of aneurysm or dissection of the thoracic or abdominal aorta. 2. Minimal aortic atherosclerosis. 3. Possible small gastric ulcer in the pyloric region. No perforation. 4. Fatty infiltration of the liver. Electronically Signed   By: Lucienne Capers M.D.   On: 02/18/2017 21:39    DISCHARGE EXAMINATION: Vitals:   02/19/17 1815 02/19/17 2015 02/20/17 0546 02/20/17 0600  BP: (!) 168/87 (!) 157/84 (!) 166/98 (!) 145/76  Pulse: 79 83 69   Resp: 18 18    Temp: 99.5 F (37.5 C) 98.6 F (37 C) (!) 97.4 F (36.3 C)   TempSrc: Oral Oral Oral   SpO2: 98% 95% 97%   Weight:      Height:       General appearance: alert, cooperative, appears stated age and no distress Resp: clear to auscultation bilaterally Cardio: regular rate and rhythm, S1, S2 normal, no murmur, click, rub or gallop GI: soft, non-tender; bowel sounds normal; no masses,  no organomegaly  DISPOSITION: Home  Discharge Instructions    Call MD for:  extreme fatigue    Complete by:  As directed    Call MD for:  persistant dizziness or light-headedness    Complete by:  As directed    Call MD for:  persistant nausea and vomiting    Complete by:  As directed    Call MD for:  severe uncontrolled pain    Complete by:  As directed    Call MD for:  temperature >100.4    Complete by:  As directed    Diet - low sodium heart healthy    Complete by:  As directed    Discharge  instructions    Complete by:  As directed    Please be sure to follow up with your PCP this week. Do not start taking the Amlodipine till you have seen your PCP. You may continue taking your other BP medication. Please stop using Goody's powder and other NSAID medications such as motrin, Aleve, Indocin etc.  You were cared for by a hospitalist during your hospital stay. If you have any questions about your discharge medications or the care you received while you were in the hospital after you are discharged, you can call the unit and asked to speak with the hospitalist on call if the hospitalist that took care of you is not available. Once you are discharged, your primary care physician will handle any further medical issues. Please note that NO REFILLS for any discharge medications will be authorized once you are discharged, as it is imperative that you return to your primary care physician (or establish a relationship with a primary care physician if you do not have one) for your aftercare needs so that they can reassess your need for medications and monitor your lab values. If you do not have a primary care physician, you can call [phone] for a physician referral.  Increase activity slowly    Complete by:  As directed       ALLERGIES:  Allergies  Allergen Reactions  . Lisinopril Cough  . Penicillins Nausea Only and Rash    Stomach upset Has patient had a PCN reaction causing immediate rash, facial/tongue/throat swelling, SOB or lightheadedness with hypotension: No Has patient had a PCN reaction causing severe rash involving mucus membranes or skin necrosis: Yes Has patient had a PCN reaction that required hospitalization: NO Has patient had a PCN reaction occurring within the last 10 years: Yes If all of the above answers are "NO", then may proceed with Cephalosporin use.    . Prednisone     "makes me crazy"     Current Discharge Medication List    START taking these medications    Details  amLODipine (NORVASC) 5 MG tablet Take 1 tablet (5 mg total) by mouth daily. Qty: 30 tablet, Refills: 0    fluticasone (FLONASE) 50 MCG/ACT nasal spray Place 2 sprays into both nostrils daily. Qty: 16 g, Refills: 2    sucralfate (CARAFATE) 1 GM/10ML suspension Take 10 mLs (1 g total) by mouth 4 (four) times daily -  with meals and at bedtime. Qty: 420 mL, Refills: 0      CONTINUE these medications which have CHANGED   Details  esomeprazole (NEXIUM) 40 MG capsule Take twice daily for 2 weeks and then once daily as before. Qty: 60 capsule, Refills: 0    nicotine (NICODERM CQ - DOSED IN MG/24 HOURS) 14 mg/24hr patch Place 1 patch (14 mg total) onto the skin daily. Qty: 30 patch, Refills: 0      CONTINUE these medications which have NOT CHANGED   Details  B Complex Vitamins (B-COMPLEX/B-12) TABS Take 1 tablet by mouth daily.    BIOTIN 5000 PO Take 1 capsule by mouth daily.    Cholecalciferol (VITAMIN D3) 5000 units CAPS Take 5,000 Units by mouth daily.    mirtazapine (REMERON) 15 MG tablet Take 1 tablet (15 mg total) by mouth at bedtime. Qty: 30 tablet, Refills: 1    Multiple Vitamin (MULTIVITAMIN WITH MINERALS) TABS tablet Take 1 tablet by mouth daily.    polyethylene glycol (MIRALAX / GLYCOLAX) packet Take 17 g by mouth every other day.     telmisartan (MICARDIS) 40 MG tablet Take 40 mg by mouth daily.  Refills: 4      STOP taking these medications     olmesartan (BENICAR) 20 MG tablet      magic mouthwash w/lidocaine SOLN      ondansetron (ZOFRAN) 8 MG tablet      oxyCODONE (OXY IR/ROXICODONE) 5 MG immediate release tablet      prochlorperazine (COMPAZINE) 10 MG tablet      valsartan-hydrochlorothiazide (DIOVAN-HCT) 160-25 MG tablet          Follow-up Information    Velna Hatchet, MD Follow up.   Specialty:  Internal Medicine Why:  this week Contact information: [place] [address] [phone]           TOTAL  DISCHARGE TIME: 60 mins  [facility] Pager [phone]  02/20/2017, 2:22 PM

## 2017-02-20 NOTE — Discharge Instructions (Signed)
Gastritis, Adult Gastritis is inflammation of the stomach. There are two kinds of gastritis:  Acute gastritis. This kind develops suddenly.  Chronic gastritis. This kind lasts for a long time.  Gastritis happens when the lining of the stomach becomes weak or gets damaged. Without treatment, gastritis can lead to stomach bleeding and ulcers. What are the causes? This condition may be caused by:  An infection.  Drinking too much alcohol.  Certain medicines.  Having too much acid in the stomach.  A disease of the intestines or stomach.  Stress.  What are the signs or symptoms? Symptoms of this condition include:  Pain or a burning in the upper abdomen.  Nausea.  Vomiting.  An uncomfortable feeling of fullness after eating.  In some cases, there are no symptoms. How is this diagnosed? This condition may be diagnosed with:  A description of your symptoms.  A physical exam.  Tests. These can include: ? Blood tests. ? Stool tests. ? A test in which a thin, flexible instrument with a light and camera on the end is passed down the esophagus and into the stomach (upper endoscopy). ? A test in which a sample of tissue is taken for testing (biopsy).  How is this treated? This condition may be treated with medicines. If the condition is caused by a bacterial infection, you may be given antibiotic medicines. If it is caused by too much acid in the stomach, you may get medicines called H2 blockers, proton pump inhibitors, or antacids. Treatment may also involve stopping the use of certain medicines, such as aspirin, ibuprofen, or other nonsteroidal anti-inflammatory drugs (NSAIDs). Follow these instructions at home:  Take over-the-counter and prescription medicines only as told by your health care provider.  If you were prescribed an antibiotic, take it as told by your health care provider. Do not stop taking the antibiotic even if you start to feel better.  Drink enough  fluid to keep your urine clear or pale yellow.  Eat small, frequent meals instead of large meals. Contact a health care provider if:  Your symptoms get worse.  Your symptoms return after treatment. Get help right away if:  You vomit blood or material that looks like coffee grounds.  You have black or dark red stools.  You are unable to keep fluids down.  Your abdominal pain gets worse.  You have a fever.  You do not feel better after 1 week. This information is not intended to replace advice given to you by your health care provider. Make sure you discuss any questions you have with your health care provider. Document Released: 05/25/2001 Document Revised: 01/28/2016 Document Reviewed: 02/22/2015 Elsevier Interactive Patient Education  2018 [employer] Inc.  

## 2017-02-20 NOTE — Progress Notes (Signed)
Progress Note  Patient Name: Emily Santiago Date of Encounter: 02/20/2017  Primary Cardiologist: new  Subjective   No chest pain since yesterday  Inpatient Medications    Scheduled Meds: . fluticasone  2 spray Each Nare Daily  . irbesartan  37.5 mg Oral Daily  . nicotine  14 mg Transdermal Daily  . pantoprazole  40 mg Oral BID  . polyethylene glycol  17 g Oral QODAY  . sucralfate  1 g Oral TID WC & HS   Continuous Infusions:  PRN Meds: acetaminophen **OR** acetaminophen, hydrALAZINE, HYDROcodone-acetaminophen, ondansetron **OR** ondansetron (ZOFRAN) IV, zolpidem   Vital Signs    Vitals:   02/19/17 1815 02/19/17 2015 02/20/17 0546 02/20/17 0600  BP: (!) 168/87 (!) 157/84 (!) 166/98 (!) 145/76  Pulse: 79 83 69   Resp: 18 18    Temp: 99.5 F (37.5 C) 98.6 F (37 C) (!) 97.4 F (36.3 C)   TempSrc: Oral Oral Oral   SpO2: 98% 95% 97%   Weight:      Height:        Intake/Output Summary (Last 24 hours) at 02/20/17 1111 Last data filed at 02/20/17 0600  Gross per 24 hour  Intake              240 ml  Output             1700 ml  Net            -1460 ml   Filed Weights   02/19/17 0100  Weight: 153 lb 14.1 oz (69.8 kg)    Telemetry   SR - Personally Reviewed   Physical Exam   GEN: No acute distress.   Cardiac: RRR, no murmurs, rubs, or gallops.  Respiratory: Clear to auscultation bilaterally. MS: No edema; No deformity. Neuro:  Nonfocal  Psych: Normal affect   Labs    Chemistry Recent Labs Lab 02/18/17 1659 02/19/17 0123 02/20/17 0532  NA 137 142 140  K 3.3* 3.7 4.0  CL 101 111 106  CO2 23 23 28   GLUCOSE 80 123* 89  BUN 16 13 11   CREATININE 0.55 0.51 0.46  CALCIUM 9.2 8.7* 9.1  PROT  --  6.5  --   ALBUMIN  --  3.8  --   AST  --  92*  --   ALT  --  58*  --   ALKPHOS  --  80  --   BILITOT  --  0.3  --   GFRNONAA >60 >60 >60  GFRAA >60 >60 >60  ANIONGAP 13 8 6      Hematology Recent Labs Lab 02/18/17 1659 02/19/17 0123 02/20/17 0532    WBC 6.7 11.4* 5.0  RBC 4.26 4.04 4.34  HGB 13.7 12.9 13.6  HCT 39.8 37.9 41.5  MCV 93.4 93.8 95.6  MCH 32.2 31.9 31.3  MCHC 34.4 34.0 32.8  RDW 13.3 13.4 13.2  PLT 223 208 200    Cardiac Enzymes Recent Labs Lab 02/19/17 0123 02/19/17 0723 02/19/17 1304  TROPONINI <0.03 <0.03 <0.03    Recent Labs Lab 02/18/17 1718 02/18/17 1953  TROPIPOC 0.01 0.00     BNP Recent Labs Lab 02/18/17 1659  BNP 16.5       Cardiac Studies   Echo Study Conclusions  - Left ventricle: The cavity size was normal. Systolic function was   normal. The estimated ejection fraction was in the range of 55%   to 60%. Wall motion was normal; there were no regional wall  motion abnormalities.   Patient Profile     56 y.o. female with chest pain, hx HTN and tobacco use.    Assessment & Plan    1.  Chest pain with atypical features which is somewhat pleuritic in a patient with known lymphoma- nuc study done.  -- echo was normal. 2.  Hypertension with accelerated hypertension recently-BP elevated here for lexiscan.  3.  History of hyperlipidemia 4.  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  Overweight 6.  Atherosclerosis of aorta   For questions or updates, please contact [place] Please consult [email or web address] for contact info under Cardiology/STEMI. Daytime calls, contact the Day Call APP (6a-8a) or assigned team (Teams A-D) provider (7:30a - 5p). All other daytime calls (7:30-5p), contact the Card Master @ [phone].   Nighttime calls, contact the assigned APP (5p-8p) or MD (6:30p-8p). Overnight calls (8p-6a), contact the on call Fellow @ [phone].      Signed, Cecilie Kicks, NP  02/20/2017, 11:11 AM    Seen in nuclear medicine.  She needs good BP control and needs to stop smoking.  Her myocardial perfusion scan was low risk and may be discharged.  Needs good BP control and should be on lipid lowering therapy because of calcification in  aorta.   Kerry Hough MD [facility] [place] [facility]

## 2017-02-21 LAB — HEPATITIS PANEL, ACUTE
HCV Ab: <0.1 s/co ratio (ref 0.0–0.9)
Hep A IgM: NEGATIVE
Hep B C IgM: NEGATIVE
Hepatitis B Surface Ag: NEGATIVE

## 2017-02-22 DIAGNOSIS — R0789 Other chest pain: Secondary | ICD-10-CM | POA: Diagnosis not present

## 2017-02-22 DIAGNOSIS — I10 Essential (primary) hypertension: Secondary | ICD-10-CM | POA: Diagnosis not present

## 2017-02-22 DIAGNOSIS — K219 Gastro-esophageal reflux disease without esophagitis: Secondary | ICD-10-CM | POA: Diagnosis not present

## 2017-02-22 DIAGNOSIS — R740 Nonspecific elevation of levels of transaminase and lactic acid dehydrogenase [LDH]: Secondary | ICD-10-CM | POA: Diagnosis not present

## 2017-03-10 ENCOUNTER — Telehealth: Payer: Self-pay

## 2017-03-10 NOTE — Telephone Encounter (Signed)
Thanks for the information. I will discuss it with her at the office visit.

## 2017-03-10 NOTE — Telephone Encounter (Signed)
Pt. contacted for a recall colonoscopy. Offered a previsit / colon date to schedule.Office visit scheduled for 04-26-2017 to discuss colonoscopy per her request. Pt does have a family history of colon cancer in her farther and maternal grandmother. See recall paperwork in NP file.

## 2017-03-18 ENCOUNTER — Ambulatory Visit
Admission: RE | Admit: 2017-03-18 | Discharge: 2017-03-18 | Disposition: A | Discharge status: Home / Self Care | Payer: BLUE CROSS/BLUE SHIELD | Source: Ambulatory Visit | Attending: Internal Medicine | Admitting: Internal Medicine

## 2017-03-18 DIAGNOSIS — Z1231 Encounter for screening mammogram for malignant neoplasm of breast: Secondary | ICD-10-CM | POA: Diagnosis not present

## 2017-03-18 DIAGNOSIS — Z1239 Encounter for other screening for malignant neoplasm of breast: Secondary | ICD-10-CM

## 2017-04-09 ENCOUNTER — Encounter (HOSPITAL_COMMUNITY): Payer: Self-pay | Admitting: Family Medicine

## 2017-04-09 ENCOUNTER — Ambulatory Visit (HOSPITAL_COMMUNITY)
Admission: EM | Admit: 2017-04-09 | Discharge: 2017-04-09 | Disposition: A | Discharge status: Home / Self Care | Payer: BLUE CROSS/BLUE SHIELD | Attending: Family Medicine | Admitting: Family Medicine

## 2017-04-09 DIAGNOSIS — B029 Zoster without complications: Secondary | ICD-10-CM

## 2017-04-09 MED ORDER — VALACYCLOVIR HCL 1 G PO TABS: 1000.0000 mg | ORAL_TABLET | Freq: Three times a day (TID) | ORAL | 0 refills | Status: AC

## 2017-04-09 NOTE — Discharge Instructions (Signed)
Use Cetirizine 10 mg (Zyrtec) for the itching.

## 2017-04-09 NOTE — ED Provider Notes (Signed)
[place]   [id number] 04/09/17 Arrival Time: [place]   SUBJECTIVE:  EVEREST HACKING is a 56 y.o. female who presents to the urgent care with complaint of rash.  It began on the left side of her neck last night and is quite itchy.  She has an associated hyperesthesia over her left scapula.  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 Anxiety   . Hypertension   . Lymphoma ([place])    Lymphoma  . Varicose veins    Family History  Problem Relation Age of Onset  . Heart disease Father   . Colon cancer Father        dx in his 54's  . Heart disease Maternal Grandmother   . Breast cancer Maternal Grandmother   . Prostate cancer Maternal Uncle   . Brain cancer Brother   . Liver cancer Brother   . Bone cancer Brother   . Prostate cancer Brother   . Breast cancer Cousin        x3   Social History   Social History  . Marital status: Married    Spouse name: N/A  . Number of children: 0  . Years of education: N/A   Occupational History  . [employer]    Social History Main Topics  . Smoking status: Current Every Day Smoker    Packs/day: 1.00    Years: 35.00    Types: Cigarettes  . Smokeless tobacco: Never Used     Comment: form given 04-22-16  . Alcohol use No  . Drug use: No  . Sexual activity: Not on file   Other Topics Concern  . Not on file   Social History Narrative  . No narrative on file   Current Meds  Medication Sig  . amLODipine (NORVASC) 5 MG tablet Take 1 tablet (5 mg total) by mouth daily.  . B Complex Vitamins (B-COMPLEX/B-12) TABS Take 1 tablet by mouth daily.  Marland Kitchen BIOTIN 5000 PO Take 1 capsule by mouth daily.  . Cholecalciferol (VITAMIN D3) 5000 units CAPS Take 5,000 Units by mouth daily.  Marland Kitchen esomeprazole (NEXIUM) 40 MG capsule Take twice daily for 2 weeks and then once daily as before.  . fluticasone (FLONASE) 50 MCG/ACT nasal spray Place  2 sprays into both nostrils daily.  . mirtazapine (REMERON) 15 MG tablet Take 1 tablet (15 mg total) by mouth at bedtime.  . Multiple Vitamin (MULTIVITAMIN WITH MINERALS) TABS tablet Take 1 tablet by mouth daily.  . nicotine (NICODERM CQ - DOSED IN MG/24 HOURS) 14 mg/24hr patch Place 1 patch (14 mg total) onto the skin daily.  . polyethylene glycol (MIRALAX / GLYCOLAX) packet Take 17 g by mouth every other day.   . telmisartan (MICARDIS) 40 MG tablet Take 40 mg by mouth daily.    Allergies  Allergen Reactions  . Lisinopril Cough  . Penicillins Nausea Only and Rash    Stomach upset Has patient had a PCN reaction causing immediate rash, facial/tongue/throat swelling, SOB or lightheadedness with hypotension: No Has patient had a PCN reaction causing severe rash involving mucus membranes or skin necrosis: Yes Has patient had a PCN reaction that required hospitalization: NO Has patient had a PCN reaction occurring within the last 10 years: Yes If all of the above answers are "NO", then may proceed with Cephalosporin use.    . Prednisone     "  makes me crazy"      ROS: As per HPI, remainder of ROS negative.   OBJECTIVE:   Vitals:   04/09/17 1405  BP: (!) 165/99  Pulse: 68  Resp: 16  Temp: 98.4 F (36.9 C)  TempSrc: Oral  SpO2: 95%     General appearance: alert; no distress Eyes: PERRL; EOMI; conjunctiva normal HENT: normocephalic; atraumatic;  external ears normal without trauma; nasal mucosa normal; oral mucosa normal Neck: supple Back: no CVA tenderness Extremities: no cyanosis or edema; symmetrical with no gross deformities Skin: warm and dry, linear erythematous rash with vesiculation on lateral left neck.  Tender skin over left scapula without rash. Neurologic: normal gait; grossly normal Psychological: alert and cooperative; normal mood and affect      Labs:  Results for orders placed or performed during the hospital encounter of 02/18/17  MRSA PCR Screening    Result Value Ref Range   MRSA by PCR NEGATIVE NEGATIVE  NM Myocar Multi W/Spect W/Wall Motion / EF  Result Value Ref Range   Rest HR 71 bpm   Rest BP 190/116 mmHg   RPE 0    Exercise duration (sec) 0 sec   Percent HR 73 %   Exercise duration (min) 0 min   Estimated workload 1.0 METS   Peak HR 120 bpm   Peak BP 208/100 mmHg   MPHR 163 bpm  Basic metabolic panel  Result Value Ref Range   Sodium 137 135 - 145 mmol/L   Potassium 3.3 (L) 3.5 - 5.1 mmol/L   Chloride 101 101 - 111 mmol/L   CO2 23 22 - 32 mmol/L   Glucose, Bld 80 65 - 99 mg/dL   BUN 16 6 - 20 mg/dL   Creatinine, Ser 0.55 0.44 - 1.00 mg/dL   Calcium 9.2 8.9 - 10.3 mg/dL   GFR calc non Af Amer >60 >60 mL/min   GFR calc Af Amer >60 >60 mL/min   Anion gap 13 5 - 15  CBC  Result Value Ref Range   WBC 6.7 4.0 - 10.5 K/uL   RBC 4.26 3.87 - 5.11 MIL/uL   Hemoglobin 13.7 12.0 - 15.0 g/dL   HCT 39.8 36.0 - 46.0 %   MCV 93.4 78.0 - 100.0 fL   MCH 32.2 26.0 - 34.0 pg   MCHC 34.4 30.0 - 36.0 g/dL   RDW 13.3 11.5 - 15.5 %   Platelets 223 150 - 400 K/uL  Brain natriuretic peptide  Result Value Ref Range   B Natriuretic Peptide 16.5 0.0 - 100.0 pg/mL  Troponin I (q 6hr x 3)  Result Value Ref Range   Troponin I <0.03 <0.03 ng/mL  Troponin I (q 6hr x 3)  Result Value Ref Range   Troponin I <0.03 <0.03 ng/mL  HIV antibody (Routine Testing)  Result Value Ref Range   HIV Screen 4th Generation wRfx Non Reactive Non Reactive  Magnesium  Result Value Ref Range   Magnesium 1.8 1.7 - 2.4 mg/dL  Phosphorus  Result Value Ref Range   Phosphorus 3.8 2.5 - 4.6 mg/dL  TSH  Result Value Ref Range   TSH 2.284 0.350 - 4.500 uIU/mL  Comprehensive metabolic panel  Result Value Ref Range   Sodium 142 135 - 145 mmol/L   Potassium 3.7 3.5 - 5.1 mmol/L   Chloride 111 101 - 111 mmol/L   CO2 23 22 - 32 mmol/L   Glucose, Bld 123 (H) 65 - 99 mg/dL   BUN 13 6 -  20 mg/dL   Creatinine, Ser 0.51 0.44 - 1.00 mg/dL   Calcium 8.7 (L) 8.9 -  10.3 mg/dL   Total Protein 6.5 6.5 - 8.1 g/dL   Albumin 3.8 3.5 - 5.0 g/dL   AST 92 (H) 15 - 41 U/L   ALT 58 (H) 14 - 54 U/L   Alkaline Phosphatase 80 38 - 126 U/L   Total Bilirubin 0.3 0.3 - 1.2 mg/dL   GFR calc non Af Amer >60 >60 mL/min   GFR calc Af Amer >60 >60 mL/min   Anion gap 8 5 - 15  CBC  Result Value Ref Range   WBC 11.4 (H) 4.0 - 10.5 K/uL   RBC 4.04 3.87 - 5.11 MIL/uL   Hemoglobin 12.9 12.0 - 15.0 g/dL   HCT 37.9 36.0 - 46.0 %   MCV 93.8 78.0 - 100.0 fL   MCH 31.9 26.0 - 34.0 pg   MCHC 34.0 30.0 - 36.0 g/dL   RDW 13.4 11.5 - 15.5 %   Platelets 208 150 - 400 K/uL  Lipid panel  Result Value Ref Range   Cholesterol 255 (H) 0 - 200 mg/dL   Triglycerides 138 <150 mg/dL   HDL 43 >40 mg/dL   Total CHOL/HDL Ratio 5.9 RATIO   VLDL 28 0 - 40 mg/dL   LDL Cholesterol 184 (H) 0 - 99 mg/dL  Troponin I  Result Value Ref Range   Troponin I <0.03 <0.03 ng/mL  Lipase, blood  Result Value Ref Range   Lipase 18 11 - 51 U/L  CBC  Result Value Ref Range   WBC 5.0 4.0 - 10.5 K/uL   RBC 4.34 3.87 - 5.11 MIL/uL   Hemoglobin 13.6 12.0 - 15.0 g/dL   HCT 41.5 36.0 - 46.0 %   MCV 95.6 78.0 - 100.0 fL   MCH 31.3 26.0 - 34.0 pg   MCHC 32.8 30.0 - 36.0 g/dL   RDW 13.2 11.5 - 15.5 %   Platelets 200 150 - 400 K/uL  Basic metabolic panel  Result Value Ref Range   Sodium 140 135 - 145 mmol/L   Potassium 4.0 3.5 - 5.1 mmol/L   Chloride 106 101 - 111 mmol/L   CO2 28 22 - 32 mmol/L   Glucose, Bld 89 65 - 99 mg/dL   BUN 11 6 - 20 mg/dL   Creatinine, Ser 0.46 0.44 - 1.00 mg/dL   Calcium 9.1 8.9 - 10.3 mg/dL   GFR calc non Af Amer >60 >60 mL/min   GFR calc Af Amer >60 >60 mL/min   Anion gap 6 5 - 15  Hepatitis panel, acute  Result Value Ref Range   Hepatitis B Surface Ag Negative Negative   HCV Ab <0.1 0.0 - 0.9 s/co ratio   Hep A IgM Negative Negative   Hep B C IgM Negative Negative  I-stat troponin, ED  Result Value Ref Range   Troponin i, poc 0.01 0.00 - 0.08 ng/mL    Comment 3          I-stat troponin, ED  Result Value Ref Range   Troponin i, poc 0.00 0.00 - 0.08 ng/mL   Comment 3          ECHOCARDIOGRAM COMPLETE  Result Value Ref Range   Weight 2,462.1 oz   Height 61 in   BP 120/74 mmHg    Labs Reviewed - No data to display  No results found.     ASSESSMENT & PLAN:  1. Herpes zoster  without complication     Meds ordered this encounter  Medications  . valACYclovir (VALTREX) 1000 MG tablet    Sig: Take 1 tablet (1,000 mg total) by mouth 3 (three) times daily.    Dispense:  21 tablet    Refill:  0    Reviewed expectations re: course of current medical issues. Questions answered. Outlined signs and symptoms indicating need for more acute intervention. Patient verbalized understanding. After Visit Summary given.    Procedures:      Robyn Haber, MD 04/09/17 1413

## 2017-04-09 NOTE — ED Triage Notes (Signed)
Pt here for rash on neck onset last night  Denies pain, burning, fevers  Pt believes it may be shingles  Applying OTC Cortizone cream w/no relief  A&O x4... NAD... Ambulatory

## 2017-04-22 ENCOUNTER — Encounter: Payer: Self-pay | Admitting: Hematology and Oncology

## 2017-04-22 ENCOUNTER — Ambulatory Visit (HOSPITAL_BASED_OUTPATIENT_CLINIC_OR_DEPARTMENT_OTHER): Payer: BLUE CROSS/BLUE SHIELD | Admitting: Hematology and Oncology

## 2017-04-22 ENCOUNTER — Other Ambulatory Visit (HOSPITAL_BASED_OUTPATIENT_CLINIC_OR_DEPARTMENT_OTHER): Payer: BLUE CROSS/BLUE SHIELD

## 2017-04-22 DIAGNOSIS — I10 Essential (primary) hypertension: Secondary | ICD-10-CM

## 2017-04-22 DIAGNOSIS — C8231 Follicular lymphoma grade IIIa, lymph nodes of head, face, and neck: Secondary | ICD-10-CM | POA: Diagnosis not present

## 2017-04-22 DIAGNOSIS — G4700 Insomnia, unspecified: Secondary | ICD-10-CM | POA: Diagnosis not present

## 2017-04-22 DIAGNOSIS — Z638 Other specified problems related to primary support group: Secondary | ICD-10-CM

## 2017-04-22 LAB — CBC WITH DIFFERENTIAL/PLATELET
BASO%: 0.1 % (ref 0.0–2.0)
Basophils Absolute: 0 10e3/uL (ref 0.0–0.1)
EOS%: 1.2 % (ref 0.0–7.0)
Eosinophils Absolute: 0.1 10e3/uL (ref 0.0–0.5)
HCT: 40.5 % (ref 34.8–46.6)
HGB: 13.4 g/dL (ref 11.6–15.9)
LYMPH%: 22.6 % (ref 14.0–49.7)
MCH: 31.5 pg (ref 25.1–34.0)
MCHC: 33.1 g/dL (ref 31.5–36.0)
MCV: 95.1 fL (ref 79.5–101.0)
MONO#: 0.4 10e3/uL (ref 0.1–0.9)
MONO%: 4.9 % (ref 0.0–14.0)
NEUT#: 5.3 10e3/uL (ref 1.5–6.5)
NEUT%: 71.2 % (ref 38.4–76.8)
Platelets: 219 10e3/uL (ref 145–400)
RBC: 4.26 10e6/uL (ref 3.70–5.45)
RDW: 13.4 % (ref 11.2–14.5)
WBC: 7.5 10e3/uL (ref 3.9–10.3)
lymph#: 1.7 10e3/uL (ref 0.9–3.3)

## 2017-04-22 LAB — COMPREHENSIVE METABOLIC PANEL
ALT: 16 U/L (ref 0–55)
AST: 18 U/L (ref 5–34)
Albumin: 3.9 g/dL (ref 3.5–5.0)
Alkaline Phosphatase: 91 U/L (ref 40–150)
Anion Gap: 10 mEq/L (ref 3–11)
BUN: 21.6 mg/dL (ref 7.0–26.0)
CO2: 24 mEq/L (ref 22–29)
Calcium: 9.5 mg/dL (ref 8.4–10.4)
Chloride: 102 mEq/L (ref 98–109)
Creatinine: 0.8 mg/dL (ref 0.6–1.1)
EGFR: >60 mL/min/{1.73_m2} (ref >60)
Glucose: 77 mg/dl (ref 70–140)
Potassium: 5 mEq/L (ref 3.5–5.1)
Sodium: 137 mEq/L (ref 136–145)
Total Bilirubin: 0.55 mg/dL (ref 0.20–1.20)
Total Protein: 7.1 g/dL (ref 6.4–8.3)

## 2017-04-22 MED ORDER — TRAZODONE HCL 100 MG PO TABS: 100.0000 mg | ORAL_TABLET | Freq: Every day | ORAL | 3 refills | Status: DC

## 2017-04-22 MED ORDER — AMLODIPINE BESYLATE 10 MG PO TABS: 10.0000 mg | ORAL_TABLET | Freq: Every day | ORAL | 9 refills | Status: DC

## 2017-04-22 NOTE — Assessment & Plan Note (Signed)
The patient is crying in my office over problems with her marriage I have given the name and contact details of the social worker to help the patient

## 2017-04-22 NOTE — Assessment & Plan Note (Signed)
I recommend a trial of trazodone

## 2017-04-22 NOTE — Assessment & Plan Note (Signed)
Examination is benign She has no signs of cancer recurrence.  Her last imaging study done elsewhere around September showed no evidence of disease She does not need surveillance routine imaging study I will see her back in a few months for further follow-up
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
## 2017-04-22 NOTE — Assessment & Plan Note (Signed)
She has poorly controlled hypertension I recommend addition of amlodipine She needs to make an appointment to see her primary care doctor within the next month for further management

## 2017-04-26 ENCOUNTER — Encounter: Payer: Self-pay | Admitting: Gastroenterology

## 2017-04-26 ENCOUNTER — Ambulatory Visit (INDEPENDENT_AMBULATORY_CARE_PROVIDER_SITE_OTHER): Payer: BLUE CROSS/BLUE SHIELD | Admitting: Gastroenterology

## 2017-04-26 VITALS — BP 120/64 | HR 83 | Ht 61.0 in | Wt 157.0 lb

## 2017-04-26 DIAGNOSIS — Z800 Family history of malignant neoplasm of digestive organs: Secondary | ICD-10-CM

## 2017-04-26 DIAGNOSIS — K5900 Constipation, unspecified: Secondary | ICD-10-CM | POA: Diagnosis not present

## 2017-04-26 NOTE — Progress Notes (Signed)
[facility] Consult Note:  History: Emily Santiago 04/26/2017  Referring physician: Velna Hatchet, MD  Reason for consult/chief complaint: Colonoscopy (recommended by patient's oncologist; ) and Constipation (currently takes fiber pills and Miralax; pt states she is constipated all the time, stool is very hard and difficult to push out)   Subjective  HPI:  This is a 56 year old woman referred by primary care for history of colon polyps. She was also sent to [place] by her oncologist, and the patient has a history of follicular lymphoma now in remission. She had a hyperplastic polyp removed on a colonoscopy by Dr. Wilford Corner in November 2013. She reportedly had a family history of colon cancer, but was not clear from her chart if this was accurate. She was therefore brought for an office visit today. Discussion today reveals she is certain that her father had colorectal cancer at about age 17. Or may have been some other extended relatives with colon cancer as well, but she is much less certain about that. Emily Santiago has chronic issues with constipation, which she will take fiber pills or MiraLAX with some relief. She denies rectal bleeding, her appetite is good and her weight stable. She was last seen here in December 2017 for reflux issues.  Emily Santiago was also describing some other somatic pain symptoms and recent depression over the breakup of her marriage. ROS:  Review of Systems  She denies chest pain dyspnea or dysuria Past Medical History: Past Medical History:  Diagnosis Date  . Anxiety   . Atrophic vaginitis   . CKD (chronic kidney disease), stage II   . COPD (chronic obstructive pulmonary disease) ([place])   . Depression   . GERD (gastroesophageal reflux disease)   . History of head and neck cancer   . Hx of colonic polyps   . Hyperlipidemia   . Hypertension   . Insomnia   . Lymphoma ([place])    Lymphoma  . Osteopenia   . Plantar fasciitis   . Tobacco  abuse   . Vallecular mass   . Varicose veins   . Vitamin D deficiency      Past Surgical History: Past Surgical History:  Procedure Laterality Date  . ABDOMINAL HYSTERECTOMY    . CHOLECYSTECTOMY    . LYMPH NODE BIOPSY    . WRIST SURGERY Right      Family History: Family History  Problem Relation Age of Onset  . Heart disease Father   . Colon cancer Father        dx in his 57's  . Diabetes Father        metastatic  . Heart disease Maternal Grandmother   . Breast cancer Maternal Grandmother   . Prostate cancer Maternal Uncle   . Brain cancer Brother   . Liver cancer Brother   . Bone cancer Brother   . Prostate cancer Brother   . Hyperlipidemia Mother   . Breast cancer Cousin        x3  . Stomach cancer Neg Hx   . Pancreatic cancer Neg Hx     Social History: Social History   Socioeconomic History  . Marital status: Married    Spouse name: None  . Number of children: 0  . Years of education: None  . Highest education level: None  Social Needs  . Financial resource strain: None  . Food insecurity - worry: None  . Food insecurity - inability: None  . Transportation needs - medical: None  . Transportation needs -  non-medical: None  Occupational History  . Occupation: Engineer, manufacturing  Tobacco Use  . Smoking status: Current Every Day Smoker    Packs/day: 1.00    Years: 35.00    Pack years: 35.00    Types: Cigarettes  . Smokeless tobacco: Never Used  . Tobacco comment: form given 04-22-16  Substance and Sexual Activity  . Alcohol use: No  . Drug use: No  . Sexual activity: None  Other Topics Concern  . None  Social History Narrative  . None    Allergies: Allergies  Allergen Reactions  . Lisinopril Cough  . Penicillins Nausea Only and Rash    Stomach upset Has patient had a PCN reaction causing immediate rash, facial/tongue/throat swelling, SOB or lightheadedness with hypotension: No Has patient had a PCN reaction causing severe rash involving  mucus membranes or skin necrosis: Yes Has patient had a PCN reaction that required hospitalization: NO Has patient had a PCN reaction occurring within the last 10 years: Yes If all of the above answers are "NO", then may proceed with Cephalosporin use.    . Prednisone     "makes me crazy"    Outpatient Meds: Current Outpatient Medications  Medication Sig Dispense Refill  . amLODipine (NORVASC) 10 MG tablet Take 1 tablet (10 mg total) daily by mouth. 30 tablet 9  . B Complex Vitamins (B-COMPLEX/B-12) TABS Take 1 tablet by mouth daily.    Marland Kitchen BIOTIN 5000 PO Take 1 capsule by mouth daily.    . Cholecalciferol (VITAMIN D3) 5000 units CAPS Take 5,000 Units by mouth daily.    Marland Kitchen esomeprazole (NEXIUM) 40 MG capsule Take twice daily for 2 weeks and then once daily as before. 60 capsule 0  . Multiple Vitamin (MULTIVITAMIN WITH MINERALS) TABS tablet Take 1 tablet by mouth daily.    . polyethylene glycol (MIRALAX / GLYCOLAX) packet Take 17 g by mouth every other day.     . telmisartan (MICARDIS) 40 MG tablet Take 40 mg by mouth daily.   4  . traZODone (DESYREL) 100 MG tablet Take 1 tablet (100 mg total) at bedtime by mouth. 30 tablet 3   No current facility-administered medications for this visit.       ___________________________________________________________________ Objective   Exam:  BP 120/64   Pulse 83   Ht 5\' 1"  (1.549 m)   Wt 157 lb (71.2 kg)   BMI 29.66 kg/m    General: this is a(n) well-appearing woman with a depressed affect   Eyes: sclera anicteric, no redness  ENT: oral mucosa moist without lesions, no cervical or supraclavicular lymphadenopathy, good dentition  CV: RRR without murmur, S1/S2, no JVD, no peripheral edema  Resp: clear to auscultation bilaterally, normal RR and effort noted  GI: soft, no tenderness, with active bowel sounds. No guarding or palpable organomegaly noted.  Skin; warm and dry, no rash or jaundice noted  Neuro: awake, alert and oriented  x 3. Normal gross motor function and fluent speech  Labs:  Previous colonoscopy and biopsy reports were reviewed and filed in the chart.  Assessment: Encounter Diagnoses  Name Primary?  . Family history of colon cancer in father Yes  . Constipation, unspecified constipation type     She is due for screening colonoscopy because of her family history. She was advised to take MiraLAX more often as needed to relieve constipation.    Thank you for the courtesy of this consult.  Please call me with any questions or concerns.  [name]  CC: Velna Hatchet, MD

## 2017-04-26 NOTE — Patient Instructions (Signed)
If you are age 57 or older, your body mass index should be between 23-30. Your Body mass index is 29.66 kg/m. If this is out of the aforementioned range listed, please consider follow up with your Primary Care Provider.  If you are age 89 or younger, your body mass index should be between 19-25. Your Body mass index is 29.66 kg/m. If this is out of the aformentioned range listed, please consider follow up with your Primary Care Provider.   You have been scheduled for a colonoscopy. Please follow written instructions given to you at your visit today.  Please pick up your prep supplies at the pharmacy within the next 1-3 days. If you use inhalers (even only as needed), please bring them with you on the day of your procedure. Your physician has requested that you go to www.startemmi.com and enter the access code given to you at your visit today. This web site gives a general overview about your procedure. However, you should still follow specific instructions given to you by our office regarding your preparation for the procedure.  Thank you for choosing [facility]  Dr Wilfrid Lund III

## 2017-04-27 ENCOUNTER — Telehealth: Payer: Self-pay

## 2017-04-27 ENCOUNTER — Encounter: Payer: Self-pay | Admitting: *Deleted

## 2017-04-27 NOTE — Telephone Encounter (Signed)
Pt called that the amlodipine is giving her side effects. She started it on 11/9. She feels "like I'm high, can't comprehend what she is doing". She is having memory loss eg she went to work yesterday and left billfold at home.  This morning's BP was 110/56. She will feel dizzy when she gets up, and when she bends over. Dizziness will clear in about 30 seconds.   She states she has not been taking her telmisartan 40 mg in the morning b/c the amlodipine would bring her pressure down.   Also she restarted her trazodone on 11/9. In the past she did not get swimmy headed from the trazodone.   Instructed pt to hold amlodipine and trazodone tonight. Do not take telmisartan in morning.  Take melatonin to help sleep. Take BP in morning and call back with update on how she feels.

## 2017-04-27 NOTE — Progress Notes (Signed)
[facility] Work  Clinical Social Work was referred by Futures trader for assessment of psychosocial needs; specifically marital concerns.  Clinical Social Worker attempted to contact patient by phone to offer support and assess for needs.  CSW left voicemail to return call.    Maryjean Morn, MSW, LCSW, OSW-C Clinical Social Worker [facility] [phone]

## 2017-04-29 ENCOUNTER — Telehealth: Payer: Self-pay

## 2017-04-29 NOTE — Telephone Encounter (Signed)
Patient called to give update. BP last night 124/74 and pulse 83. She is taking Telmisartan 40 mg in the am.  She is is not taking the Trazadone or Amlodipine because it is making her forgetful and she feels like she is drunk. She is taking Melatonin at night for sleep with no problems.

## 2017-05-20 ENCOUNTER — Encounter: Payer: Self-pay | Admitting: Gastroenterology

## 2017-05-20 ENCOUNTER — Encounter: Payer: BLUE CROSS/BLUE SHIELD | Admitting: Gastroenterology

## 2017-05-20 ENCOUNTER — Ambulatory Visit (AMBULATORY_SURGERY_CENTER): Payer: BLUE CROSS/BLUE SHIELD | Admitting: Gastroenterology

## 2017-05-20 ENCOUNTER — Other Ambulatory Visit: Payer: Self-pay

## 2017-05-20 VITALS — BP 154/75 | HR 66 | Temp 98.4°F | Resp 18 | Ht 61.0 in | Wt 157.0 lb

## 2017-05-20 DIAGNOSIS — D122 Benign neoplasm of ascending colon: Secondary | ICD-10-CM | POA: Diagnosis not present

## 2017-05-20 DIAGNOSIS — Z1211 Encounter for screening for malignant neoplasm of colon: Secondary | ICD-10-CM

## 2017-05-20 DIAGNOSIS — Z800 Family history of malignant neoplasm of digestive organs: Secondary | ICD-10-CM

## 2017-05-20 DIAGNOSIS — D125 Benign neoplasm of sigmoid colon: Secondary | ICD-10-CM

## 2017-05-20 MED ORDER — SODIUM CHLORIDE 0.9 % IV SOLN: 500.0000 mL | Freq: Once | INTRAVENOUS | Status: DC

## 2017-05-20 NOTE — Progress Notes (Signed)
To recovery, report to RN, VSS. 

## 2017-05-20 NOTE — Progress Notes (Signed)
Called to room to assist during endoscopic procedure.  Patient ID and intended procedure confirmed with present staff. Received instructions for my participation in the procedure from the performing physician.  

## 2017-05-20 NOTE — Patient Instructions (Signed)
YOU HAD AN ENDOSCOPIC PROCEDURE TODAY AT [facility]:   Refer to the procedure report that was given to you for any specific questions about what was found during the examination.  If the procedure report does not answer your questions, please call your gastroenterologist to clarify.  If you requested that your care partner not be given the details of your procedure findings, then the procedure report has been included in a sealed envelope for you to review at your convenience later.  YOU SHOULD EXPECT: Some feelings of bloating in the abdomen. Passage of more gas than usual.  Walking can help get rid of the air that was put into your GI tract during the procedure and reduce the bloating. If you had a lower endoscopy (such as a colonoscopy or flexible sigmoidoscopy) you may notice spotting of blood in your stool or on the toilet paper. If you underwent a bowel prep for your procedure, you may not have a normal bowel movement for a few days.  Please Note:  You might notice some irritation and congestion in your nose or some drainage.  This is from the oxygen used during your procedure.  There is no need for concern and it should clear up in a day or so.  SYMPTOMS TO REPORT IMMEDIATELY:   Following lower endoscopy (colonoscopy or flexible sigmoidoscopy):  Excessive amounts of blood in the stool  Significant tenderness or worsening of abdominal pains  Swelling of the abdomen that is new, acute  Fever of 100F or higher   Following upper endoscopy (EGD)  Vomiting of blood or coffee ground material  New chest pain or pain under the shoulder blades  Painful or persistently difficult swallowing  New shortness of breath  Fever of 100F or higher  Black, tarry-looking stools  For urgent or emergent issues, a gastroenterologist can be reached at any hour by calling [phone].   DIET:  We do recommend a small meal at first, but then you may proceed to your regular diet.  Drink  plenty of fluids but you should avoid alcoholic beverages for 24 hours.  ACTIVITY:  You should plan to take it easy for the rest of today and you should NOT DRIVE or use heavy machinery until tomorrow (because of the sedation medicines used during the test).    FOLLOW UP: Our staff will call the number listed on your records the next business day following your procedure to check on you and address any questions or concerns that you may have regarding the information given to you following your procedure. If we do not reach you, we will leave a message.  However, if you are feeling well and you are not experiencing any problems, there is no need to return our call.  We will assume that you have returned to your regular daily activities without incident.  If any biopsies were taken you will be contacted by phone or by letter within the next 1-3 weeks.  Please call us at [phone] if you have not heard about the biopsies in 3 weeks.    SIGNATURES/CONFIDENTIALITY: You and/or your care partner have signed paperwork which will be entered into your electronic medical record.  These signatures attest to the fact that that the information above on your After Visit Summary has been reviewed and is understood.  Full responsibility of the confidentiality of this discharge information lies with you and/or your care-partner.   No NSAIDS: aspirin, aleve, ibuprofen for 1 week.

## 2017-05-20 NOTE — Op Note (Signed)
[facility] Patient Name: Emily Santiago Procedure Date: 05/20/2017 2:57 PM MRN: [id number] Endoscopist: Mallie Mussel L. Loletha Carrow , MD Age: 56 Referring MD:  Date of Birth: 30-Sep-1960 Gender: Female Account #: [id number] Procedure:                Colonoscopy Indications:              Colon cancer screening in patient at increased                            risk: Colorectal cancer in mother Medicines:                Monitored Anesthesia Care Procedure:                Pre-Anesthesia Assessment:                           - Prior to the procedure, a History and Physical                            was performed, and patient medications and                            allergies were reviewed. The patient's tolerance of                            previous anesthesia was also reviewed. The risks                            and benefits of the procedure and the sedation                            options and risks were discussed with the patient.                            All questions were answered, and informed consent                            was obtained. Prior Anticoagulants: The patient has                            taken no previous anticoagulant or antiplatelet                            agents. ASA Grade Assessment: II - A patient with                            mild systemic disease. After reviewing the risks                            and benefits, the patient was deemed in                            satisfactory condition to undergo the procedure.  After obtaining informed consent, the colonoscope                            was passed under direct vision. Throughout the                            procedure, the patient's blood pressure, pulse, and                            oxygen saturations were monitored continuously. The                            Model PCF-H190DL [id number]) scope was introduced                            through the anus and advanced  to the the cecum,                            identified by appendiceal orifice and ileocecal                            valve. The colonoscopy was performed without                            difficulty. The patient tolerated the procedure                            well. The quality of the bowel preparation was                            excellent. The ileocecal valve, appendiceal                            orifice, and rectum were photographed. The quality                            of the bowel preparation was evaluated using the                            BBPS [facility] Bowel Preparation Scale) with scores                            of: Right Colon = 3, Transverse Colon = 3 and Left                            Colon = 3 (entire mucosa seen well with no residual                            staining, small fragments of stool or opaque                            liquid). The total BBPS score equals 9. The bowel  preparation used was [place]. Scope In: 3:01:56 PM Scope Out: 3:30:48 PM Scope Withdrawal Time: 0 hours 24 minutes 38 seconds  Total Procedure Duration: 0 hours 28 minutes 52 seconds  Findings:                 The perianal and digital rectal examinations were                            normal.                           An area of melanosis was found in the entire colon.                           A 12 mm polyp was found in the mid ascending colon.                            The polyp was flat and multi-lobulated. The polyp                            was removed with a hot and cold snare. Resection                            and retrieval were complete. To prevent bleeding                            post-intervention, one hemostatic clip was                            successfully placed. There was no bleeding at the                            end of the procedure.                           A 4 mm polyp was found in the mid sigmoid colon.                             The polyp was sessile. The polyp was removed with a                            cold snare. Resection and retrieval were complete.                           Multiple diverticula were found in the left colon                            and right colon.                           The exam was otherwise without abnormality on                            direct and retroflexion views. Complications:  No immediate complications. Estimated Blood Loss:     Estimated blood loss was minimal. Impression:               - Melanosis in the colon.                           - One 12 mm polyp in the mid ascending colon,                            removed with a hot snare. Resected and retrieved.                            Clip was placed.                           - One 4 mm polyp in the mid sigmoid colon, removed                            with a cold snare. Resected and retrieved.                           - Diverticulosis in the left colon and in the right                            colon.                           - The examination was otherwise normal on direct                            and retroflexion views. Recommendation:           - Patient has a contact number available for                            emergencies. The signs and symptoms of potential                            delayed complications were discussed with the                            patient. Return to normal activities tomorrow.                            Written discharge instructions were provided to the                            patient.                           - Resume previous diet.                           - Continue present medications.                           -  Await pathology results.                           - Repeat colonoscopy is recommended for                            surveillance. The colonoscopy date will be                            determined after pathology results from today's                             exam become available for review. Henry L. Loletha Carrow, MD 05/20/2017 3:42:22 PM This report has been signed electronically.

## 2017-05-25 ENCOUNTER — Encounter: Payer: Self-pay | Admitting: Gastroenterology

## 2017-05-25 ENCOUNTER — Telehealth: Payer: Self-pay | Admitting: *Deleted

## 2017-05-25 NOTE — Telephone Encounter (Signed)
  Follow up Call-  Call back number 05/20/2017 04/23/2016  Post procedure Call Back phone  # [phone]  Permission to leave phone message Yes Yes  Some recent data might be hidden     Patient questions:  Do you have a fever, pain , or abdominal swelling? No. Pain Score  0 *  Have you tolerated food without any problems? Yes.    Have you been able to return to your normal activities? Yes.    Do you have any questions about your discharge instructions: Diet   No. Medications  No. Follow up visit  No.  Do you have questions or concerns about your Care? No.  Actions: * If pain score is 4 or above: No action needed, pain <4.

## 2017-06-01 DIAGNOSIS — Z86010 Personal history of colonic polyps: Secondary | ICD-10-CM | POA: Diagnosis not present

## 2017-06-01 DIAGNOSIS — I10 Essential (primary) hypertension: Secondary | ICD-10-CM | POA: Diagnosis not present

## 2017-06-01 DIAGNOSIS — R05 Cough: Secondary | ICD-10-CM | POA: Diagnosis not present

## 2017-06-01 DIAGNOSIS — K219 Gastro-esophageal reflux disease without esophagitis: Secondary | ICD-10-CM | POA: Diagnosis not present

## 2017-07-22 ENCOUNTER — Other Ambulatory Visit: Payer: Self-pay | Admitting: Hematology and Oncology

## 2017-07-22 ENCOUNTER — Telehealth: Payer: Self-pay | Admitting: Hematology and Oncology

## 2017-07-22 DIAGNOSIS — C8231 Follicular lymphoma grade IIIa, lymph nodes of head, face, and neck: Secondary | ICD-10-CM

## 2017-07-22 NOTE — Telephone Encounter (Signed)
Called to verify appointment

## 2017-07-25 ENCOUNTER — Telehealth: Payer: Self-pay | Admitting: Hematology and Oncology

## 2017-07-25 ENCOUNTER — Encounter: Payer: Self-pay | Admitting: Hematology and Oncology

## 2017-07-25 ENCOUNTER — Inpatient Hospital Stay: Payer: BLUE CROSS/BLUE SHIELD

## 2017-07-25 ENCOUNTER — Inpatient Hospital Stay: Payer: BLUE CROSS/BLUE SHIELD | Attending: Hematology and Oncology | Admitting: Hematology and Oncology

## 2017-07-25 ENCOUNTER — Telehealth: Payer: Self-pay | Admitting: *Deleted

## 2017-07-25 DIAGNOSIS — I10 Essential (primary) hypertension: Secondary | ICD-10-CM | POA: Diagnosis not present

## 2017-07-25 DIAGNOSIS — C8231 Follicular lymphoma grade IIIa, lymph nodes of head, face, and neck: Secondary | ICD-10-CM

## 2017-07-25 LAB — CBC WITH DIFFERENTIAL/PLATELET
Basophils Absolute: 0 10*3/uL (ref 0.0–0.1)
Basophils Relative: 0 %
Eosinophils Absolute: 0.1 10*3/uL (ref 0.0–0.5)
Eosinophils Relative: 2 %
HCT: 41.9 % (ref 34.8–46.6)
Hemoglobin: 13.8 g/dL (ref 11.6–15.9)
Lymphocytes Relative: 24 %
Lymphs Abs: 1.6 10*3/uL (ref 0.9–3.3)
MCH: 31 pg (ref 25.1–34.0)
MCHC: 33.1 g/dL (ref 31.5–36.0)
MCV: 93.8 fL (ref 79.5–101.0)
Monocytes Absolute: 0.4 10*3/uL (ref 0.1–0.9)
Monocytes Relative: 6 %
Neutro Abs: 4.5 10*3/uL (ref 1.5–6.5)
Neutrophils Relative %: 68 %
Platelets: 201 10*3/uL (ref 145–400)
RBC: 4.46 MIL/uL (ref 3.70–5.45)
RDW: 13.9 % (ref 11.2–14.5)
WBC: 6.6 10*3/uL (ref 3.9–10.3)

## 2017-07-25 LAB — COMPREHENSIVE METABOLIC PANEL
ALT: 12 U/L (ref 0–55)
AST: 19 U/L (ref 5–34)
Albumin: 3.8 g/dL (ref 3.5–5.0)
Alkaline Phosphatase: 83 U/L (ref 40–150)
Anion gap: 11 (ref 3–11)
BUN: 18 mg/dL (ref 7–26)
CO2: 23 mmol/L (ref 22–29)
Calcium: 9 mg/dL (ref 8.4–10.4)
Chloride: 104 mmol/L (ref 98–109)
Creatinine, Ser: 0.82 mg/dL (ref 0.60–1.10)
GFR calc Af Amer: >60 mL/min (ref >60)
GFR calc non Af Amer: >60 mL/min (ref >60)
Glucose, Bld: 76 mg/dL (ref 70–140)
Potassium: 4 mmol/L (ref 3.5–5.1)
Sodium: 138 mmol/L (ref 136–145)
Total Bilirubin: 0.2 mg/dL (ref 0.2–1.2)
Total Protein: 6.8 g/dL (ref 6.4–8.3)

## 2017-07-25 LAB — LACTATE DEHYDROGENASE: LDH: 197 U/L (ref 125–245)

## 2017-07-25 NOTE — Assessment & Plan Note (Signed)
Her blood pressure is better controlled.  She has lost some weight. She would continue close follow-up and medical management through her primary care doctor.

## 2017-07-25 NOTE — Telephone Encounter (Signed)
Gave patient AVs and calendar of upcoming June appointments. °

## 2017-07-25 NOTE — Progress Notes (Signed)
[facility] OFF[facility] PROGRESS NOTE  Patient Care Team: Velna Hatchet, MD as PCP - General (Internal Medicine) Leta Baptist, MD as Consulting Physician (Otolaryngology)  SUMMARY OF ONCOLOGIC HISTORY: Oncology History   FLIPI score 0 (low risk)     Follicular lymphoma grade iiia, lymph nodes of head, face, and neck ([place])   03/08/2016 Imaging    Ct neck showed right level IIa lymphadenopathy at site of palpable interest. Additionally, there are prominent right upper cervical and left supraclavicular lymph nodes. This may represent nodal metastasis or be related to a systemic inflammatory or lymphoproliferative process. No infectious/and process in the neck identified. Effacement of the right vallecula an asymmetric thickening of the right aryepiglottic fold, direct visualization is recommended to evaluate for mucosal lesion.      03/19/2016 Pathology Results    Accession: [id number] Histologic type: Follicular lymphoma, large cell type. Grade (if applicable): High grade, 3 A. Flow cytometry: B cell population with kappa light chain excess ([id number]) Immunohistochemical stains: CD20, CD3, CD10, BCL-2, BCL-6 and Ki-67 with appropriate controls. Touch preps/imprints: A mixture of small and large lymphoid cells. Comments: The sections show effacement of the lymph nodal architecture by numerous variably sized and ill defined atypical lymphoid follicles characterized by lack of polarity, attenuated or absent mantle zones and relatively homogeneous composition of small and large transformed and cleaved lymphoid cells. The number of large lymphoid cells varies in different follicles but generally average more than 15 cells / high power field . This is associated with scattered mitosis. No diffuse component is identified. To further evaluate this process, flow cytometric analysis was performed and shows a B cell population displaying expression of pan B cell antigens including CD20 associated  with CD10 and kappa light chain excess. Immunohistochemical stains were performed and show that the lymphoid follicles are positive for CD20, CD10, BCL-6 and BCL-2. Ki 67 shows variable increased expression ranging for 10% to >50% in many of the follicles. The atypical lymphoid follicles are surrounded by an abundant population of T cells as primarily seen with CD3. 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 (BNS:kh 03-23-16)      03/19/2016 Procedure    Direct laryngoscopy was negative for abnormalities. Biopsies are taken from right neck LN      04/12/2016 PET scan    PET scan showed abnormal right station 2A lymph node measuring 1.1 cm in length with maximum SUV of 6.1, Deauville scale 4. There is some symmetric and probably physiologic activity along the lingual tonsils. Spleen normal, and no other adenopathy identified      05/11/2016 - 06/22/2016 Chemotherapy    She received R-CHOP chemo x 3      07/13/2016 PET scan    Interval resolution of hypermetabolic right cervical level 2A lymph node. No evidence of metabolically active lymphoma.       INTERVAL HISTORY: Please see below for problem oriented charting. She returns for further follow-up She has completed recent colonoscopy without problems She had no new lymphadenopathy No recent infection Appetite is stable, and she had some recent intentional weight loss.  She felt like she has excellent energy  REVIEW OF SYSTEMS:   Constitutional: Denies fevers, chills  Eyes: Denies blurriness of vision Ears, nose, mouth, throat, and face: Denies mucositis or sore throat Respiratory: Denies cough, dyspnea or wheezes Cardiovascular: Denies palpitation, chest discomfort or lower extremity swelling Gastrointestinal:  Denies nausea, heartburn or change in bowel habits Skin: Denies abnormal skin rashes Lymphatics: Denies new lymphadenopathy  or easy bruising Neurological:Denies numbness, tingling or new  weaknesses Behavioral/Psych: Mood is stable, no new changes  All other systems were reviewed with the patient and are negative.  I have reviewed the past medical history, past surgical history, social history and family history with the patient and they are unchanged from previous note.  ALLERGIES:  is allergic to lisinopril; penicillins; and prednisone.  MEDICATIONS:  Current Outpatient Medications  Medication Sig Dispense Refill  . amLODipine (NORVASC) 10 MG tablet Take 1 tablet (10 mg total) daily by mouth. 30 tablet 9  . B Complex Vitamins (B-COMPLEX/B-12) TABS Take 1 tablet by mouth daily.    Marland Kitchen BIOTIN 5000 PO Take 1 capsule by mouth daily.    . Cholecalciferol (VITAMIN D3) 5000 units CAPS Take 5,000 Units by mouth daily.    Marland Kitchen esomeprazole (NEXIUM) 40 MG capsule Take twice daily for 2 weeks and then once daily as before. 60 capsule 0  . Multiple Vitamin (MULTIVITAMIN WITH MINERALS) TABS tablet Take 1 tablet by mouth daily.    . polyethylene glycol (MIRALAX / GLYCOLAX) packet Take 17 g by mouth every other day.     . telmisartan (MICARDIS) 40 MG tablet Take 40 mg by mouth daily.   4  . traZODone (DESYREL) 100 MG tablet Take 1 tablet (100 mg total) at bedtime by mouth. (Patient not taking: Reported on 05/20/2017) 30 tablet 3   Current Facility-Administered Medications  Medication Dose Route Frequency Provider Last Rate Last Dose  . 0.9 %  sodium chloride infusion  500 mL Intravenous Once Doran Stabler, MD        PHYSICAL EXAMINATION: ECOG PERFORMANCE STATUS: 0 - Asymptomatic  Vitals:   07/25/17 1434  BP: (!) 145/78  Pulse: 62  Resp: 18  Temp: 98.2 F (36.8 C)  SpO2: 98%   Filed Weights   07/25/17 1434  Weight: 150 lb 11.2 oz (68.4 kg)    GENERAL:alert, no distress and comfortable SKIN: skin color, texture, turgor are normal, no rashes or significant lesions EYES: normal, Conjunctiva are pink and non-injected, sclera clear OROPHARYNX:no exudate, no erythema and  lips, buccal mucosa, and tongue normal  NECK: supple, thyroid normal size, non-tender, without nodularity LYMPH:  no palpable lymphadenopathy in the cervical, axillary or inguinal LUNGS: clear to auscultation and percussion with normal breathing effort HEART: regular rate & rhythm and no murmurs and no lower extremity edema ABDOMEN:abdomen soft, non-tender and normal bowel sounds Musculoskeletal:no cyanosis of digits and no clubbing  NEURO: alert & oriented x 3 with fluent speech, no focal motor/sensory deficits  LABORATORY DATA:  I have reviewed the data as listed    Component Value Date/Time   NA 137 04/22/2017 1407   K 5.0 04/22/2017 1407   CL 106 02/20/2017 0532   CO2 24 04/22/2017 1407   GLUCOSE 77 04/22/2017 1407   BUN 21.6 04/22/2017 1407   CREATININE 0.8 04/22/2017 1407   CALCIUM 9.5 04/22/2017 1407   PROT 7.1 04/22/2017 1407   ALBUMIN 3.9 04/22/2017 1407   AST 18 04/22/2017 1407   ALT 16 04/22/2017 1407   ALKPHOS 91 04/22/2017 1407   BILITOT 0.55 04/22/2017 1407   GFRNONAA >60 02/20/2017 0532   GFRAA >60 02/20/2017 0532    No results found for: SPEP, UPEP  Lab Results  Component Value Date   WBC 6.6 07/25/2017   NEUTROABS 4.5 07/25/2017   HGB 13.8 07/25/2017   HCT 41.9 07/25/2017   MCV 93.8 07/25/2017   PLT 201 07/25/2017  Chemistry      Component Value Date/Time   NA 137 04/22/2017 1407   K 5.0 04/22/2017 1407   CL 106 02/20/2017 0532   CO2 24 04/22/2017 1407   BUN 21.6 04/22/2017 1407   CREATININE 0.8 04/22/2017 1407      Component Value Date/Time   CALCIUM 9.5 04/22/2017 1407   ALKPHOS 91 04/22/2017 1407   AST 18 04/22/2017 1407   ALT 16 04/22/2017 1407   BILITOT 0.55 04/22/2017 1407      ASSESSMENT & PLAN:  Follicular lymphoma grade iiia, lymph nodes of head, face, and neck ([facility]) Examination is benign She has no signs of cancer recurrence.  Her last imaging study done elsewhere around September 2018 showed no evidence of disease She  does not need surveillance routine imaging study I will see her back in 4 months for further follow-up  Essential hypertension Her blood pressure is better controlled.  She has lost some weight. She would continue close follow-up and medical management through her primary care doctor.   No orders of the defined types were placed in this encounter.  All questions were answered. The patient knows to call the clinic with any problems, questions or concerns. No barriers to learning was detected. I spent 10 minutes counseling the patient face to face. The total time spent in the appointment was 15 minutes and more than 50% was on counseling and review of test results     Heath Lark, MD 07/25/2017 2:53 PM

## 2017-07-25 NOTE — Telephone Encounter (Signed)
-----   Message from Heath Lark, MD sent at 07/25/2017  3:19 PM EST ----- Regarding: labs are nromal Let her know labs are normal ----- Message ----- From: Interface, Lab In [place] Sent: 07/25/2017   2:46 PM To: Heath Lark, MD

## 2017-07-25 NOTE — Assessment & Plan Note (Signed)
Examination is benign She has no signs of cancer recurrence.  Her last imaging study done elsewhere around September 2018 showed no evidence of disease She does not need surveillance routine imaging study I will see her back in 4 months for further follow-up

## 2017-07-25 NOTE — Telephone Encounter (Signed)
Notified of message below

## 2017-08-09 DIAGNOSIS — R05 Cough: Secondary | ICD-10-CM | POA: Diagnosis not present

## 2017-08-09 DIAGNOSIS — Z6827 Body mass index (BMI) 27.0-27.9, adult: Secondary | ICD-10-CM | POA: Diagnosis not present

## 2017-08-09 DIAGNOSIS — J209 Acute bronchitis, unspecified: Secondary | ICD-10-CM | POA: Diagnosis not present

## 2017-08-23 DIAGNOSIS — I10 Essential (primary) hypertension: Secondary | ICD-10-CM | POA: Diagnosis not present

## 2017-08-23 DIAGNOSIS — E559 Vitamin D deficiency, unspecified: Secondary | ICD-10-CM | POA: Diagnosis not present

## 2017-08-23 DIAGNOSIS — Z0000 Encounter for general adult medical examination without abnormal findings: Secondary | ICD-10-CM | POA: Diagnosis not present

## 2017-08-23 DIAGNOSIS — R3129 Other microscopic hematuria: Secondary | ICD-10-CM | POA: Diagnosis not present

## 2017-08-25 DIAGNOSIS — R300 Dysuria: Secondary | ICD-10-CM | POA: Diagnosis not present

## 2017-08-25 DIAGNOSIS — N390 Urinary tract infection, site not specified: Secondary | ICD-10-CM | POA: Diagnosis not present

## 2017-08-30 DIAGNOSIS — Z0000 Encounter for general adult medical examination without abnormal findings: Secondary | ICD-10-CM | POA: Diagnosis not present

## 2017-08-30 DIAGNOSIS — M545 Low back pain: Secondary | ICD-10-CM | POA: Diagnosis not present

## 2017-08-30 DIAGNOSIS — M859 Disorder of bone density and structure, unspecified: Secondary | ICD-10-CM | POA: Diagnosis not present

## 2017-08-30 DIAGNOSIS — I10 Essential (primary) hypertension: Secondary | ICD-10-CM | POA: Diagnosis not present

## 2017-08-30 DIAGNOSIS — Z1331 Encounter for screening for depression: Secondary | ICD-10-CM | POA: Diagnosis not present

## 2017-08-30 DIAGNOSIS — M810 Age-related osteoporosis without current pathological fracture: Secondary | ICD-10-CM | POA: Diagnosis not present

## 2017-08-30 DIAGNOSIS — F418 Other specified anxiety disorders: Secondary | ICD-10-CM | POA: Diagnosis not present

## 2017-08-31 ENCOUNTER — Other Ambulatory Visit: Payer: Self-pay | Admitting: Internal Medicine

## 2017-08-31 DIAGNOSIS — F17200 Nicotine dependence, unspecified, uncomplicated: Secondary | ICD-10-CM

## 2017-09-02 DIAGNOSIS — Z1212 Encounter for screening for malignant neoplasm of rectum: Secondary | ICD-10-CM | POA: Diagnosis not present

## 2017-09-21 ENCOUNTER — Ambulatory Visit
Admission: RE | Admit: 2017-09-21 | Discharge: 2017-09-21 | Disposition: A | Discharge status: Home / Self Care | Payer: BLUE CROSS/BLUE SHIELD | Source: Ambulatory Visit | Attending: Internal Medicine | Admitting: Internal Medicine

## 2017-09-21 DIAGNOSIS — F17200 Nicotine dependence, unspecified, uncomplicated: Secondary | ICD-10-CM

## 2017-09-22 DIAGNOSIS — R0781 Pleurodynia: Secondary | ICD-10-CM | POA: Diagnosis not present

## 2017-09-22 DIAGNOSIS — F419 Anxiety disorder, unspecified: Secondary | ICD-10-CM | POA: Diagnosis not present

## 2017-09-22 DIAGNOSIS — F39 Unspecified mood [affective] disorder: Secondary | ICD-10-CM | POA: Diagnosis not present

## 2017-09-23 DIAGNOSIS — J209 Acute bronchitis, unspecified: Secondary | ICD-10-CM | POA: Diagnosis not present

## 2017-09-23 DIAGNOSIS — R0781 Pleurodynia: Secondary | ICD-10-CM | POA: Diagnosis not present

## 2017-09-23 DIAGNOSIS — J449 Chronic obstructive pulmonary disease, unspecified: Secondary | ICD-10-CM | POA: Diagnosis not present

## 2017-09-23 DIAGNOSIS — J441 Chronic obstructive pulmonary disease with (acute) exacerbation: Secondary | ICD-10-CM | POA: Diagnosis not present

## 2017-10-31 DIAGNOSIS — Z6826 Body mass index (BMI) 26.0-26.9, adult: Secondary | ICD-10-CM | POA: Diagnosis not present

## 2017-10-31 DIAGNOSIS — J449 Chronic obstructive pulmonary disease, unspecified: Secondary | ICD-10-CM | POA: Diagnosis not present

## 2017-10-31 DIAGNOSIS — I10 Essential (primary) hypertension: Secondary | ICD-10-CM | POA: Diagnosis not present

## 2017-10-31 DIAGNOSIS — F418 Other specified anxiety disorders: Secondary | ICD-10-CM | POA: Diagnosis not present

## 2017-11-18 ENCOUNTER — Other Ambulatory Visit: Payer: Self-pay | Admitting: Hematology and Oncology

## 2017-11-18 DIAGNOSIS — C8231 Follicular lymphoma grade IIIa, lymph nodes of head, face, and neck: Secondary | ICD-10-CM

## 2017-11-21 ENCOUNTER — Inpatient Hospital Stay: Payer: BLUE CROSS/BLUE SHIELD | Attending: Hematology and Oncology

## 2017-11-21 ENCOUNTER — Inpatient Hospital Stay (HOSPITAL_BASED_OUTPATIENT_CLINIC_OR_DEPARTMENT_OTHER): Payer: BLUE CROSS/BLUE SHIELD | Admitting: Hematology and Oncology

## 2017-11-21 ENCOUNTER — Telehealth: Payer: Self-pay | Admitting: Hematology and Oncology

## 2017-11-21 ENCOUNTER — Encounter: Payer: Self-pay | Admitting: Hematology and Oncology

## 2017-11-21 DIAGNOSIS — Z8572 Personal history of non-Hodgkin lymphomas: Secondary | ICD-10-CM

## 2017-11-21 DIAGNOSIS — C8231 Follicular lymphoma grade IIIa, lymph nodes of head, face, and neck: Secondary | ICD-10-CM

## 2017-11-21 LAB — CBC WITH DIFFERENTIAL/PLATELET
Basophils Absolute: 0 10*3/uL (ref 0.0–0.1)
Basophils Relative: 1 %
Eosinophils Absolute: 0.1 10*3/uL (ref 0.0–0.5)
Eosinophils Relative: 2 %
HCT: 42.4 % (ref 34.8–46.6)
Hemoglobin: 14.2 g/dL (ref 11.6–15.9)
Lymphocytes Relative: 24 %
Lymphs Abs: 1.3 10*3/uL (ref 0.9–3.3)
MCH: 31.5 pg (ref 25.1–34.0)
MCHC: 33.5 g/dL (ref 31.5–36.0)
MCV: 94.2 fL (ref 79.5–101.0)
Monocytes Absolute: 0.3 10*3/uL (ref 0.1–0.9)
Monocytes Relative: 5 %
Neutro Abs: 3.7 10*3/uL (ref 1.5–6.5)
Neutrophils Relative %: 68 %
Platelets: 190 10*3/uL (ref 145–400)
RBC: 4.5 MIL/uL (ref 3.70–5.45)
RDW: 13 % (ref 11.2–14.5)
WBC: 5.4 10*3/uL (ref 3.9–10.3)

## 2017-11-21 LAB — COMPREHENSIVE METABOLIC PANEL
ALT: 9 U/L (ref 0–55)
AST: 15 U/L (ref 5–34)
Albumin: 4.3 g/dL (ref 3.5–5.0)
Alkaline Phosphatase: 77 U/L (ref 40–150)
Anion gap: 10 (ref 3–11)
BUN: 15 mg/dL (ref 7–26)
CO2: 25 mmol/L (ref 22–29)
Calcium: 9.8 mg/dL (ref 8.4–10.4)
Chloride: 101 mmol/L (ref 98–109)
Creatinine, Ser: 0.75 mg/dL (ref 0.60–1.10)
GFR calc Af Amer: >60 mL/min (ref >60)
GFR calc non Af Amer: >60 mL/min (ref >60)
Glucose, Bld: 76 mg/dL (ref 70–140)
Potassium: 4.3 mmol/L (ref 3.5–5.1)
Sodium: 136 mmol/L (ref 136–145)
Total Bilirubin: 0.2 mg/dL (ref 0.2–1.2)
Total Protein: 7.1 g/dL (ref 6.4–8.3)

## 2017-11-21 LAB — LACTATE DEHYDROGENASE: LDH: 183 U/L (ref 125–245)

## 2017-11-21 NOTE — Telephone Encounter (Signed)
Gave avs and calendar ° °

## 2017-11-21 NOTE — Assessment & Plan Note (Signed)
Examination is benign She has no signs of cancer recurrence.  Her last imaging study done elsewhere around September 2018 showed no evidence of disease She does not need surveillance routine imaging study I will see her back in 6 months for further follow-up

## 2017-11-21 NOTE — Progress Notes (Signed)
[place] OFFICE PROGRESS NOTE  Patient Care Team: Velna Hatchet, MD as PCP - General (Internal Medicine) Leta Baptist, MD as Consulting Physician (Otolaryngology)  ASSESSMENT & PLAN:  Follicular lymphoma grade iiia, lymph nodes of head, face, and neck ([place]) Examination is benign She has no signs of cancer recurrence.  Her last imaging study done elsewhere around September 2018 showed no evidence of disease She does not need surveillance routine imaging study I will see her back in 6 months for further follow-up   Orders Placed This Encounter  Procedures  . Comprehensive metabolic panel    Standing Status:   Future    Standing Expiration Date:   12/26/2018  . CBC with Differential/Platelet    Standing Status:   Future    Standing Expiration Date:   12/26/2018  . Lactate dehydrogenase    Standing Status:   Future    Standing Expiration Date:   11/22/2018    INTERVAL HISTORY: Please see below for problem oriented charting. She returns for further follow-up She has been feeling well No new lymphadenopathy She is eating healthy with intentional weight loss Denies recent infection  SUMMARY OF ONCOLOGIC HISTORY: Oncology History   FLIPI score 0 (low risk)     Follicular lymphoma grade iiia, lymph nodes of head, face, and neck ([place])   03/08/2016 Imaging    Ct neck showed right level IIa lymphadenopathy at site of palpable interest. Additionally, there are prominent right upper cervical and left supraclavicular lymph nodes. This may represent nodal metastasis or be related to a systemic inflammatory or lymphoproliferative process. No infectious/and process in the neck identified. Effacement of the right vallecula an asymmetric thickening of the right aryepiglottic fold, direct visualization is recommended to evaluate for mucosal lesion.      03/19/2016 Pathology Results    Accession: [id number] Histologic type: Follicular lymphoma, large cell type. Grade (if  applicable): High grade, 3 A. Flow cytometry: B cell population with kappa light chain excess ([id number]) Immunohistochemical stains: CD20, CD3, CD10, BCL-2, BCL-6 and Ki-67 with appropriate controls. Touch preps/imprints: A mixture of small and large lymphoid cells. Comments: The sections show effacement of the lymph nodal architecture by numerous variably sized and ill defined atypical lymphoid follicles characterized by lack of polarity, attenuated or absent mantle zones and relatively homogeneous composition of small and large transformed and cleaved lymphoid cells. The number of large lymphoid cells varies in different follicles but generally average more than 15 cells / high power field . This is associated with scattered mitosis. No diffuse component is identified. To further evaluate this process, flow cytometric analysis was performed and shows a B cell population displaying expression of pan B cell antigens including CD20 associated with CD10 and kappa light chain excess. Immunohistochemical stains were performed and show that the lymphoid follicles are positive for CD20, CD10, BCL-6 and BCL-2. Ki 67 shows variable increased expression ranging for 10% to >50% in many of the follicles. The atypical lymphoid follicles are surrounded by an abundant population of T cells as primarily seen with CD3. 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 (BNS:kh 03-23-16)      03/19/2016 Procedure    Direct laryngoscopy was negative for abnormalities. Biopsies are taken from right neck LN      04/12/2016 PET scan    PET scan showed abnormal right station 2A lymph node measuring 1.1 cm in length with maximum SUV of 6.1, Deauville scale 4. There is some symmetric and probably physiologic activity along  the lingual tonsils. Spleen normal, and no other adenopathy identified      05/11/2016 - 06/22/2016 Chemotherapy    She received R-CHOP chemo x 3      07/13/2016 PET scan    Interval  resolution of hypermetabolic right cervical level 2A lymph node. No evidence of metabolically active lymphoma.       REVIEW OF SYSTEMS:   Constitutional: Denies fevers, chills or abnormal weight loss Eyes: Denies blurriness of vision Ears, nose, mouth, throat, and face: Denies mucositis or sore throat Respiratory: Denies cough, dyspnea or wheezes Cardiovascular: Denies palpitation, chest discomfort or lower extremity swelling Gastrointestinal:  Denies nausea, heartburn or change in bowel habits Skin: Denies abnormal skin rashes Lymphatics: Denies new lymphadenopathy or easy bruising Neurological:Denies numbness, tingling or new weaknesses Behavioral/Psych: Mood is stable, no new changes  All other systems were reviewed with the patient and are negative.  I have reviewed the past medical history, past surgical history, social history and family history with the patient and they are unchanged from previous note.  ALLERGIES:  is allergic to lisinopril; penicillins; and prednisone.  MEDICATIONS:  Current Outpatient Medications  Medication Sig Dispense Refill  . amLODipine (NORVASC) 10 MG tablet Take 1 tablet (10 mg total) daily by mouth. 30 tablet 9  . B Complex Vitamins (B-COMPLEX/B-12) TABS Take 1 tablet by mouth daily.    Marland Kitchen BIOTIN 5000 PO Take 1 capsule by mouth daily.    . Cholecalciferol (VITAMIN D3) 5000 units CAPS Take 5,000 Units by mouth daily.    Marland Kitchen esomeprazole (NEXIUM) 40 MG capsule Take twice daily for 2 weeks and then once daily as before. 60 capsule 0  . Multiple Vitamin (MULTIVITAMIN WITH MINERALS) TABS tablet Take 1 tablet by mouth daily.    . polyethylene glycol (MIRALAX / GLYCOLAX) packet Take 17 g by mouth every other day.     . telmisartan (MICARDIS) 40 MG tablet Take 40 mg by mouth daily.   4  . traZODone (DESYREL) 100 MG tablet Take 1 tablet (100 mg total) at bedtime by mouth. (Patient not taking: Reported on 05/20/2017) 30 tablet 3   Current Facility-Administered  Medications  Medication Dose Route Frequency Provider Last Rate Last Dose  . 0.9 %  sodium chloride infusion  500 mL Intravenous Once [name], MD        PHYSICAL EXAMINATION: ECOG PERFORMANCE STATUS: 0 - Asymptomatic  Vitals:   11/21/17 1454  BP: 139/80  Pulse: 70  Resp: 18  Temp: 98.3 F (36.8 C)  SpO2: 100%   Filed Weights   11/21/17 1454  Weight: 139 lb 3.2 oz (63.1 kg)    GENERAL:alert, no distress and comfortable SKIN: skin color, texture, turgor are normal, no rashes or significant lesions EYES: normal, Conjunctiva are pink and non-injected, sclera clear OROPHARYNX:no exudate, no erythema and lips, buccal mucosa, and tongue normal  NECK: supple, thyroid normal size, non-tender, without nodularity LYMPH:  no palpable lymphadenopathy in the cervical, axillary or inguinal LUNGS: clear to auscultation and percussion with normal breathing effort HEART: regular rate & rhythm and no murmurs and no lower extremity edema ABDOMEN:abdomen soft, non-tender and normal bowel sounds Musculoskeletal:no cyanosis of digits and no clubbing  NEURO: alert & oriented x 3 with fluent speech, no focal motor/sensory deficits  LABORATORY DATA:  I have reviewed the data as listed    Component Value Date/Time   NA 138 07/25/2017 1414   NA 137 04/22/2017 1407   K 4.0 07/25/2017 1414  K 5.0 04/22/2017 1407   CL 104 07/25/2017 1414   CO2 23 07/25/2017 1414   CO2 24 04/22/2017 1407   GLUCOSE 76 07/25/2017 1414   GLUCOSE 77 04/22/2017 1407   BUN 18 07/25/2017 1414   BUN 21.6 04/22/2017 1407   CREATININE 0.82 07/25/2017 1414   CREATININE 0.8 04/22/2017 1407   CALCIUM 9.0 07/25/2017 1414   CALCIUM 9.5 04/22/2017 1407   PROT 6.8 07/25/2017 1414   PROT 7.1 04/22/2017 1407   ALBUMIN 3.8 07/25/2017 1414   ALBUMIN 3.9 04/22/2017 1407   AST 19 07/25/2017 1414   AST 18 04/22/2017 1407   ALT 12 07/25/2017 1414   ALT 16 04/22/2017 1407   ALKPHOS 83 07/25/2017 1414   ALKPHOS 91  04/22/2017 1407   BILITOT 0.2 07/25/2017 1414   BILITOT 0.55 04/22/2017 1407   GFRNONAA >60 07/25/2017 1414   GFRAA >60 07/25/2017 1414    No results found for: SPEP, UPEP  Lab Results  Component Value Date   WBC 5.4 11/21/2017   NEUTROABS 3.7 11/21/2017   HGB 14.2 11/21/2017   HCT 42.4 11/21/2017   MCV 94.2 11/21/2017   PLT 190 11/21/2017      Chemistry      Component Value Date/Time   NA 138 07/25/2017 1414   NA 137 04/22/2017 1407   K 4.0 07/25/2017 1414   K 5.0 04/22/2017 1407   CL 104 07/25/2017 1414   CO2 23 07/25/2017 1414   CO2 24 04/22/2017 1407   BUN 18 07/25/2017 1414   BUN 21.6 04/22/2017 1407   CREATININE 0.82 07/25/2017 1414   CREATININE 0.8 04/22/2017 1407      Component Value Date/Time   CALCIUM 9.0 07/25/2017 1414   CALCIUM 9.5 04/22/2017 1407   ALKPHOS 83 07/25/2017 1414   ALKPHOS 91 04/22/2017 1407   AST 19 07/25/2017 1414   AST 18 04/22/2017 1407   ALT 12 07/25/2017 1414   ALT 16 04/22/2017 1407   BILITOT 0.2 07/25/2017 1414   BILITOT 0.55 04/22/2017 1407       All questions were answered. The patient knows to call the clinic with any problems, questions or concerns. No barriers to learning was detected.  I spent 10 minutes counseling the patient face to face. The total time spent in the appointment was 15 minutes and more than 50% was on counseling and review of test results  Heath Lark, MD 11/21/2017 3:00 PM

## 2017-12-02 DIAGNOSIS — R110 Nausea: Secondary | ICD-10-CM | POA: Diagnosis not present

## 2017-12-02 DIAGNOSIS — K5909 Other constipation: Secondary | ICD-10-CM | POA: Diagnosis not present

## 2017-12-02 DIAGNOSIS — J019 Acute sinusitis, unspecified: Secondary | ICD-10-CM | POA: Diagnosis not present

## 2017-12-02 DIAGNOSIS — R05 Cough: Secondary | ICD-10-CM | POA: Diagnosis not present

## 2018-01-17 IMAGING — CT CT HEAD W/O CM
3 of 4 series · 15 of 47 positions shown, 18 images · non-contrast
Comparison: PET-CT dated 07/13/2016.

CLINICAL DATA: Occipital headache.  Treated lymphoma.

EXAM:
CT HEAD WITHOUT CONTRAST
TECHNIQUE: Contiguous axial images were obtained from the base of the skull
through the vertex without intravenous contrast.

[Series 2: head w/o · axial · non-contrast · 0.39mm/px · z∈[-63,+52]mm · 9 of 27 slices shown, 12 images]
[im 2/27  brain]
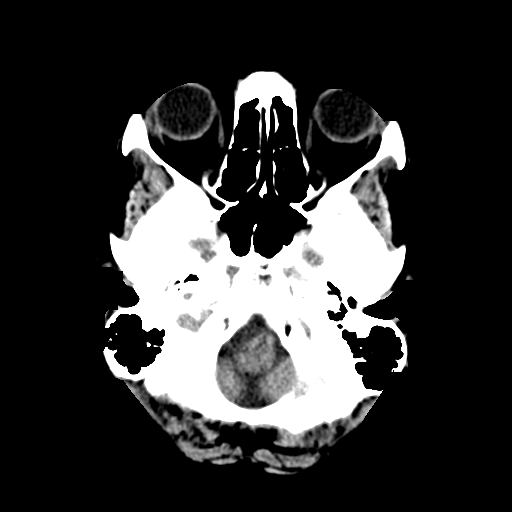
[im 2/27  bone]
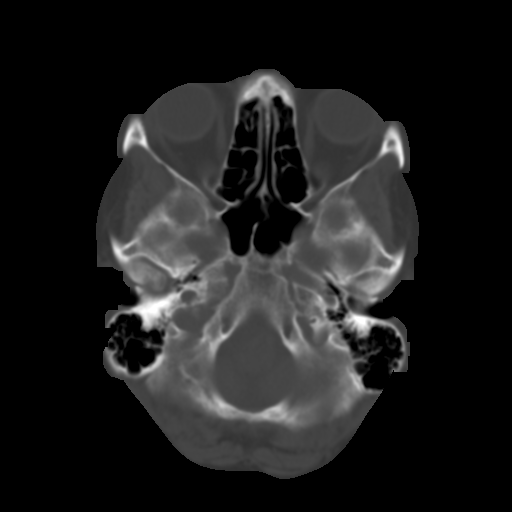
[im 6/27  brain]
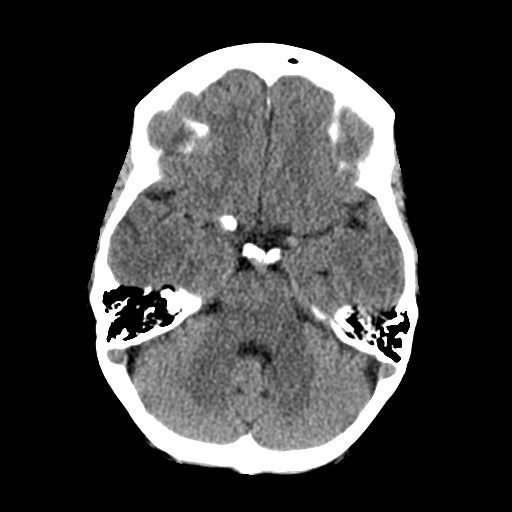
[im 8/27  brain]
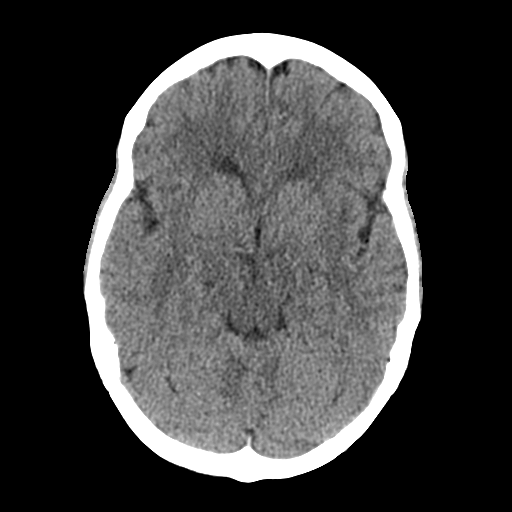
[im 12/27  brain]
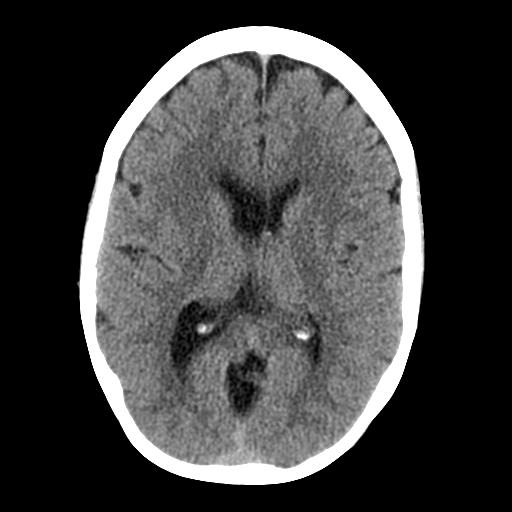
[im 14/27  brain]
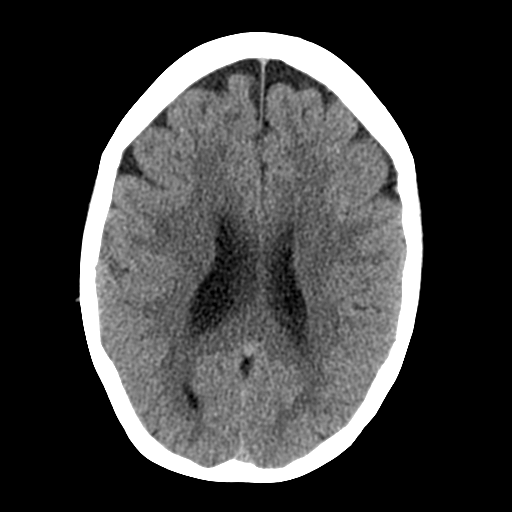
[im 14/27  bone]
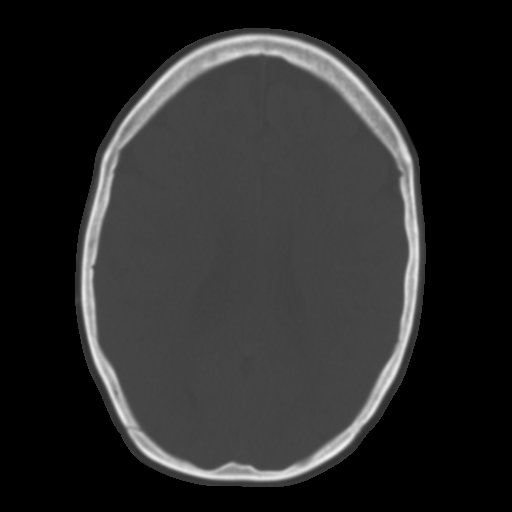
[im 15/27  brain]
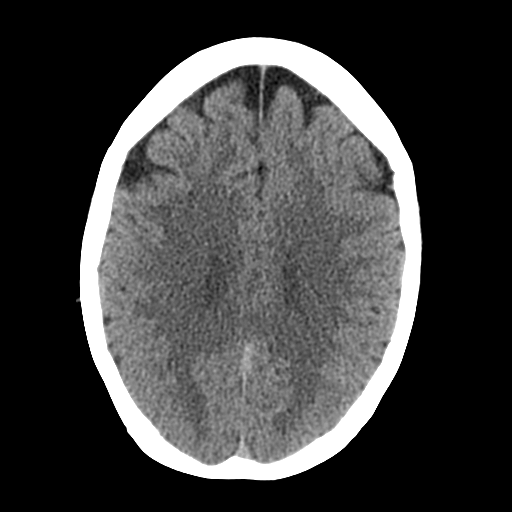
[im 19/27  brain]
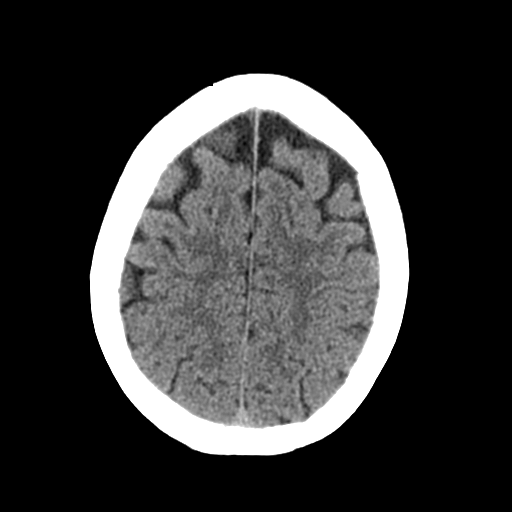
[im 21/27  brain]
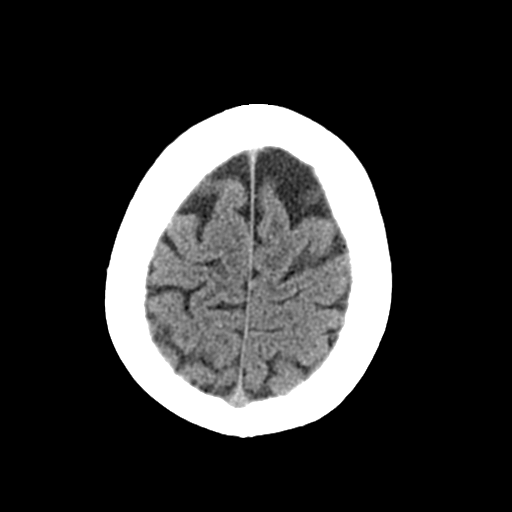
[im 25/27  brain]
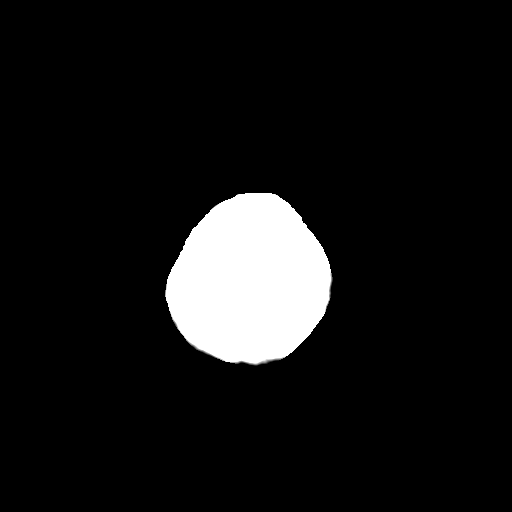
[im 25/27  bone]
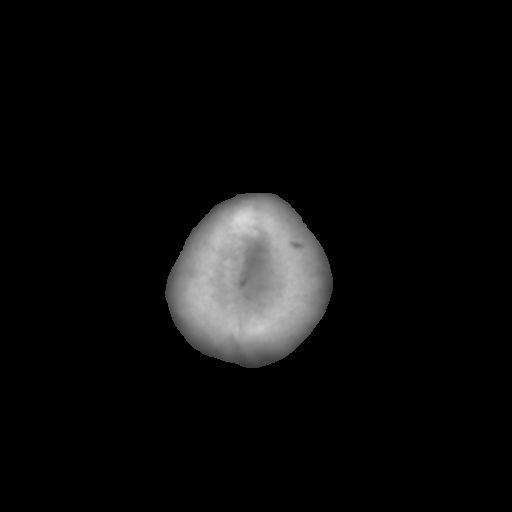

[Series 5: coronal · coronal · 0.26mm/px · 3 of 65 slices shown]
[im 22/65  brain]
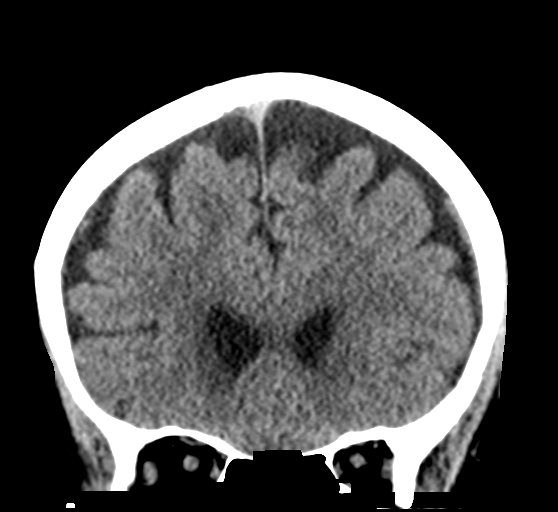
[im 29/65  brain]
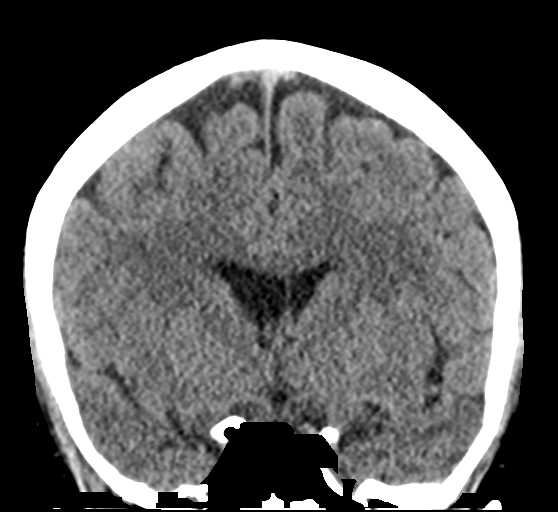
[im 36/65  brain]
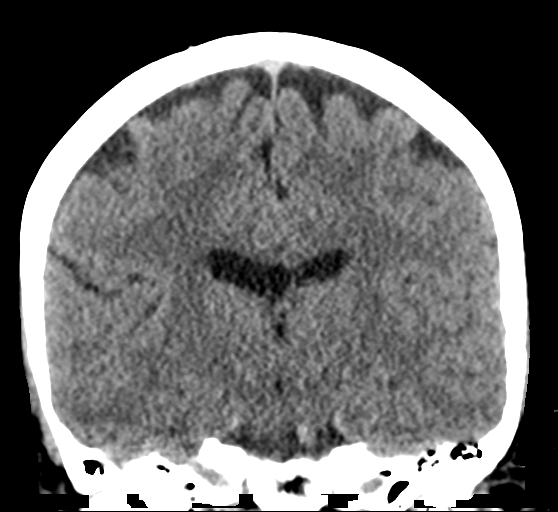

[Series 6: sagittal · sagittal · 0.26mm/px · 3 of 49 slices shown]
[im 17/49  brain]
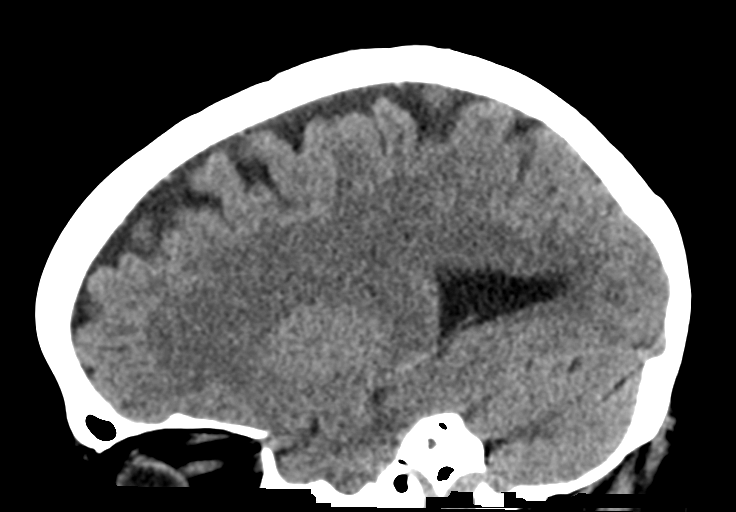
[im 25/49  brain]
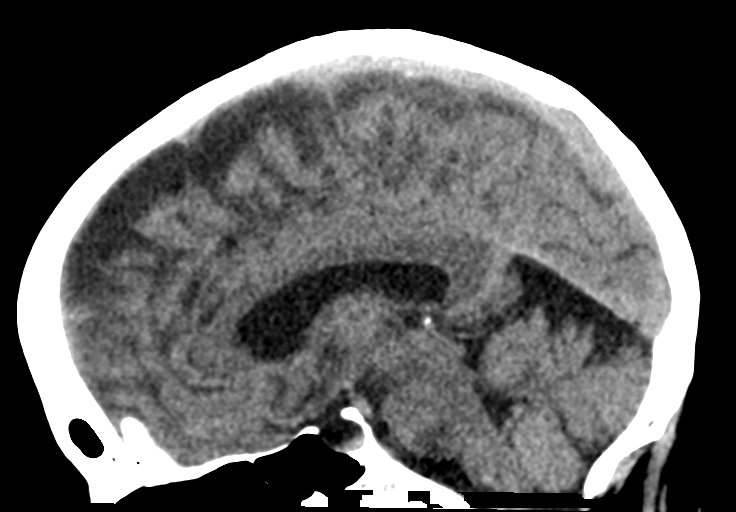
[im 33/49  brain]
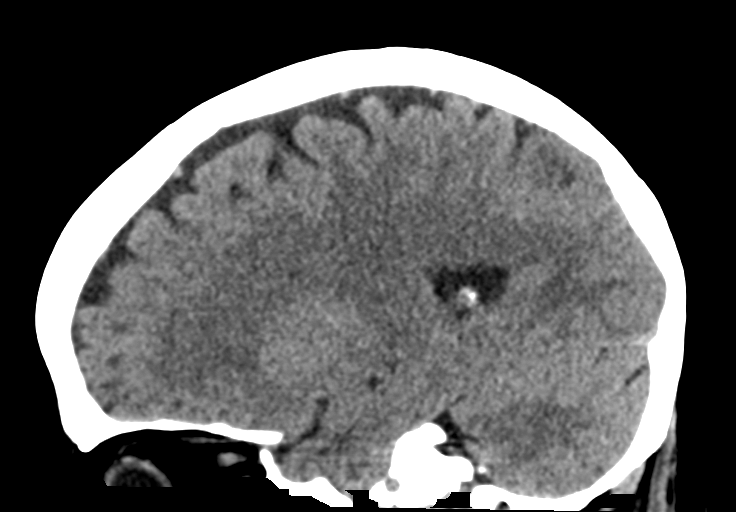

[15 of 47 positions shown; findings below may reference images not displayed]

FINDINGS: Brain: Diffusely enlarged ventricles and subarachnoid spaces. Patchy
white matter low density in both cerebral hemispheres. No
intracranial hemorrhage, mass lesion or CT evidence of acute
infarction.

Vascular: No hyperdense vessel or unexpected calcification.

Skull: Normal. Negative for fracture or focal lesion.

Sinuses/Orbits: Unremarkable.

Other: None.
IMPRESSION: 1. No acute abnormality.
2. Mild diffuse cerebral and cerebellar atrophy.
3. Minimal chronic small vessel white matter ischemic changes in
both cerebral hemispheres.

## 2018-01-17 IMAGING — CT CT ANGIO CHEST-ABD-PELV FOR DISSECTION W/ AND WO/W CM
2 of 7 series · 13 of 46 positions shown, 15 images · IV contrast (ISOVUE 370)
Comparison: CT chest lung cancer screening protocol 09/15/2015

CLINICAL DATA: Intermittent left chest pain since last night.
Chills and tingling to the left hand. Shortness of breath. Chest
pain worse with ambulation. Headache. History of lymphoma.

EXAM:
CT ANGIOGRAPHY CHEST, ABDOMEN AND PELVIS
TECHNIQUE: Multidetector CT imaging through the chest, abdomen and pelvis was
performed using the standard protocol during bolus administration of
intravenous contrast. Multiplanar reconstructed images and MIPs were
obtained and reviewed to evaluate the vascular anatomy.
CONTRAST:  100 mL Isovue 370

[Series 5: arterial 3.0 b30f · axial · arterial · 0.62mm/px · z∈[+959,+1490]mm · 10 of 201 slices shown, 12 images]
[im 12/201  soft-tissue]
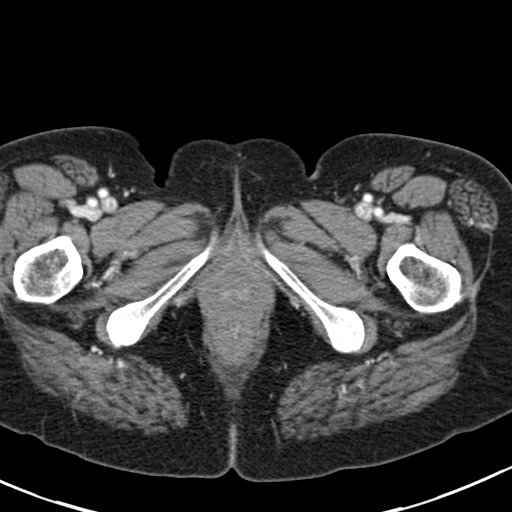
[im 12/201  bone]
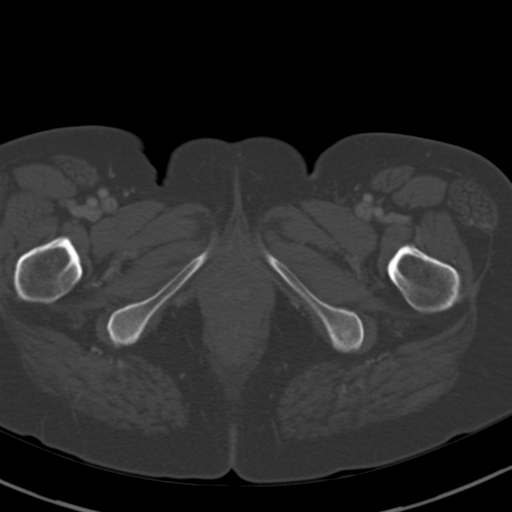
[im 34/201  soft-tissue]
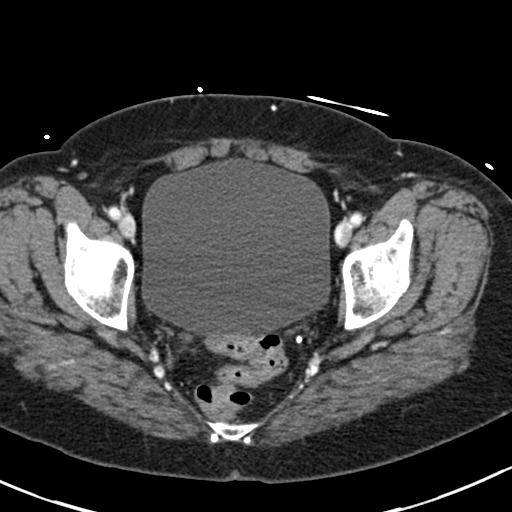
[im 56/201  soft-tissue]
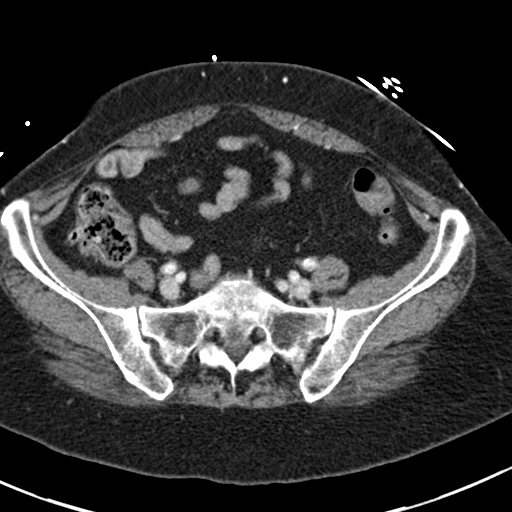
[im 67/201  soft-tissue]
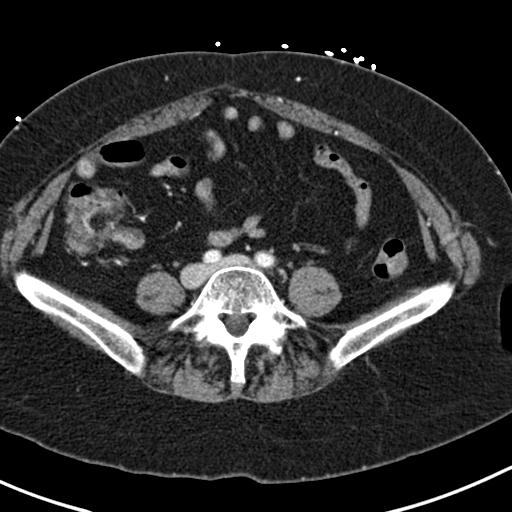
[im 89/201  soft-tissue]
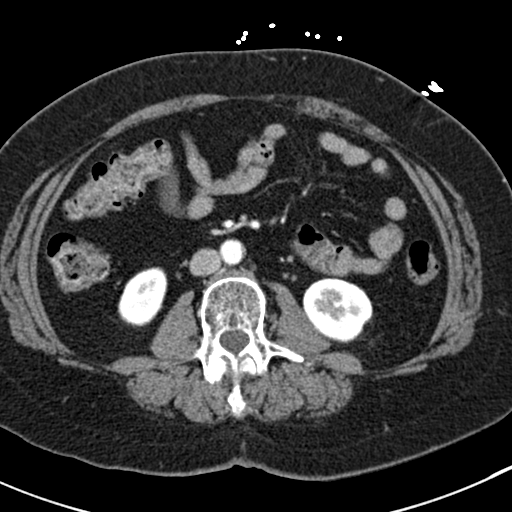
[im 112/201  soft-tissue]
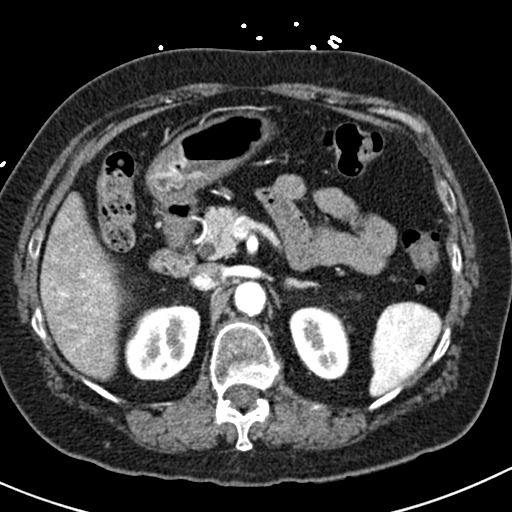
[im 134/201  soft-tissue]
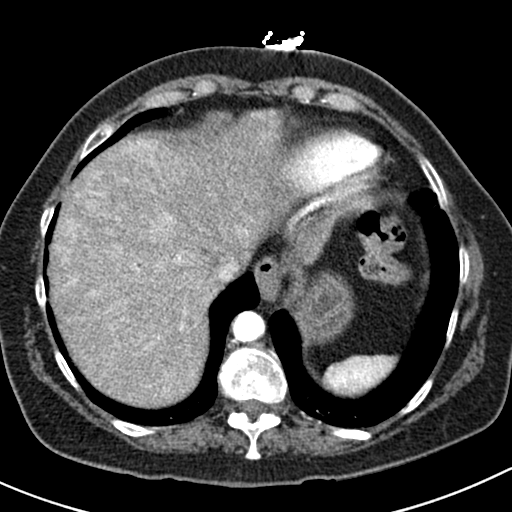
[im 145/201  soft-tissue]
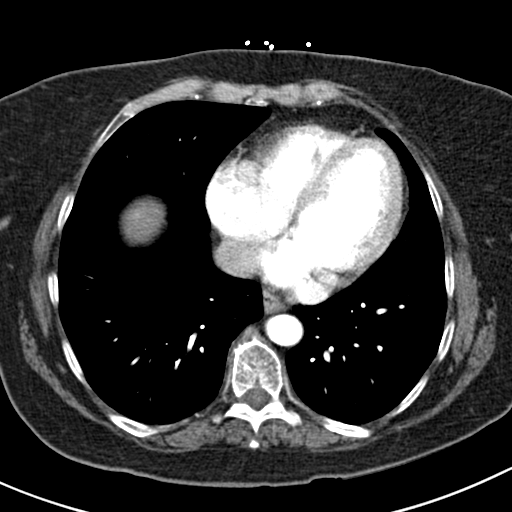
[im 167/201  soft-tissue]
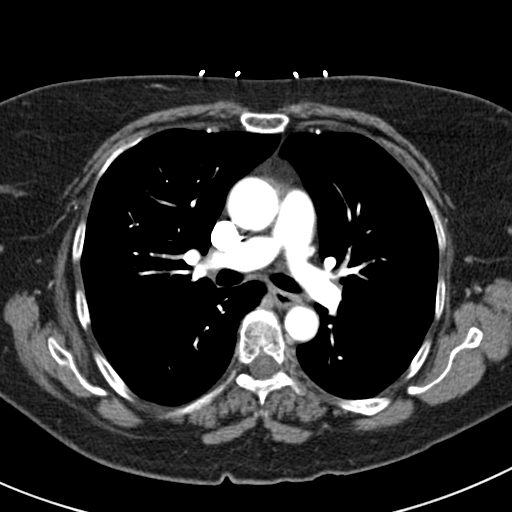
[im 167/201  bone]
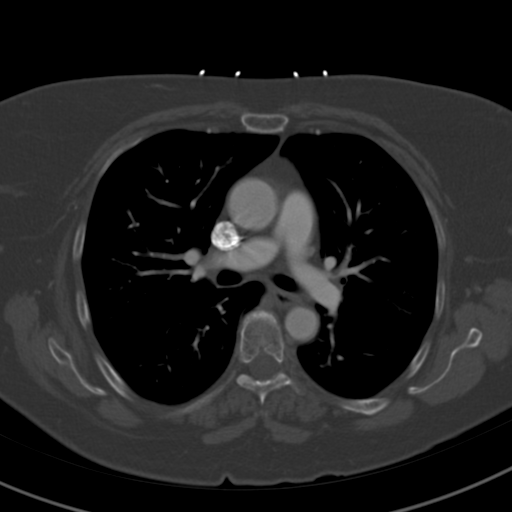
[im 189/201  soft-tissue]
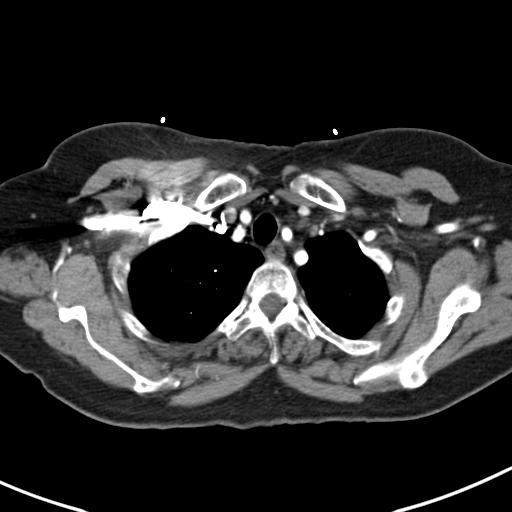

[Series 8: coronal mpr · coronal · 0.68mm/px · 3 of 139 slices shown]
[im 35/139  soft-tissue]
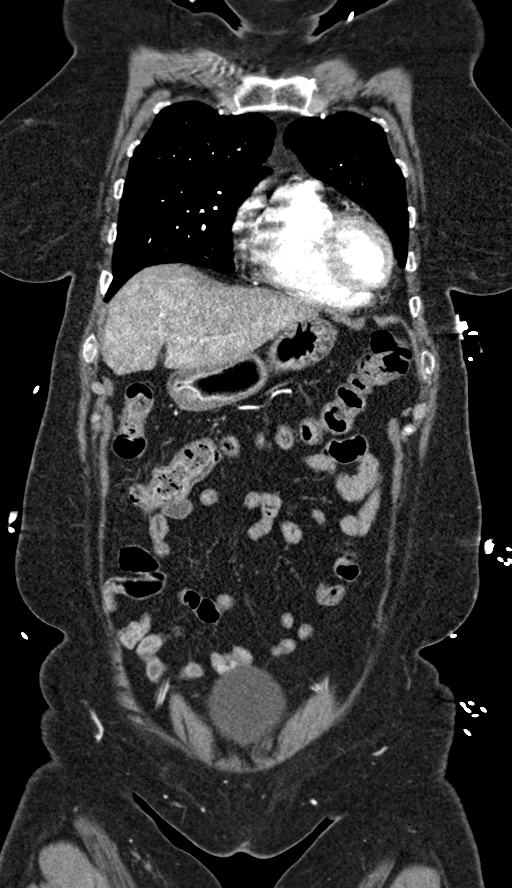
[im 70/139  soft-tissue]
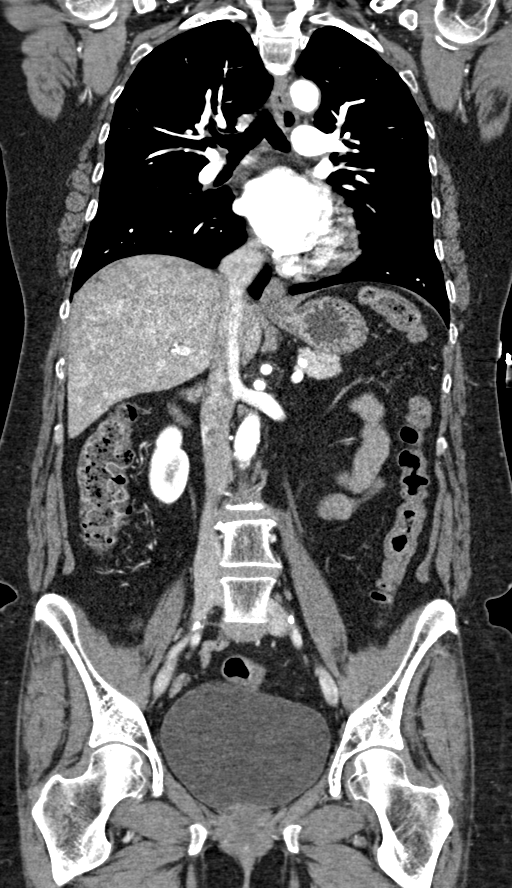
[im 104/139  soft-tissue]
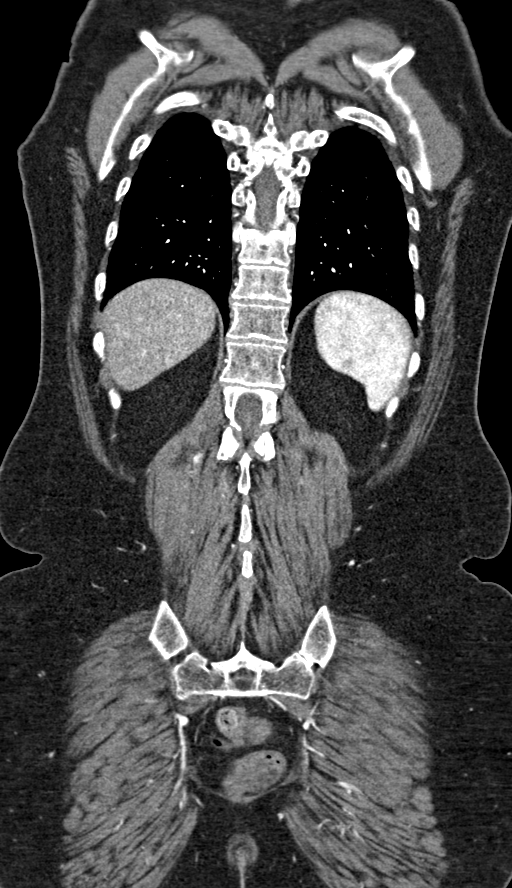

[13 of 46 positions shown; findings below may reference images not displayed]

FINDINGS: CTA CHEST FINDINGS

Cardiovascular: Noncontrast images of the chest demonstrate
scattered calcification in the aortic valve and in the thoracic
aorta. No evidence of intramural hematoma.

Images obtained during arterial phase of IV contrast administration
demonstrate patent thoracic aorta and great vessels. No evidence of
aneurysm or dissection. Motion artifact at the aortic root. Central
pulmonary arteries are well opacified without evidence of
significant pulmonary embolus. Normal heart size. No pericardial
effusions.

Mediastinum/Nodes: Small esophageal hiatal hernia. Esophagus is
decompressed. No significant lymphadenopathy in the chest. Thyroid
gland is unremarkable.

Lungs/Pleura: Mild emphysematous changes in the lungs most prominent
at the apices. Slight fibrosis or dependent change in the lung
bases. No airspace disease or consolidation. No pleural effusions.
No pneumothorax. Airways are patent.

Musculoskeletal: Degenerative changes in the spine. No destructive
bone lesions.

Review of the MIP images confirms the above findings.

CTA ABDOMEN AND PELVIS FINDINGS

VASCULAR

Aorta: Normal caliber abdominal aorta. No evidence of aneurysm or
dissection. Scattered mild calcific and noncalcific atherosclerotic
changes.

Celiac: Patent without evidence of aneurysm, dissection, vasculitis
or significant stenosis.

SMA: Patent without evidence of aneurysm, dissection, vasculitis or
significant stenosis.

Renals: Single right renal artery with early bifurcation. Duplicated
left renal artery. Renal arteries are patent bilaterally without
evidence of aneurysm or dissection. Renal nephrograms are
symmetrical.

IMA: Patent without evidence of aneurysm, dissection, vasculitis or
significant stenosis.

Inflow: Patent without evidence of aneurysm, dissection, vasculitis
or significant stenosis.

Veins: No obvious venous abnormality within the limitations of this
arterial phase study.

Review of the MIP images confirms the above findings.

NON-VASCULAR

Hepatobiliary: Mild diffuse fatty infiltration of the liver. No
focal lesions identified. Surgical absence of the gallbladder. No
bile duct dilatation.

Pancreas: Unremarkable. No pancreatic ductal dilatation or
surrounding inflammatory changes.

Spleen: Normal in size without focal abnormality.

Adrenals/Urinary Tract: Adrenal glands are unremarkable. Kidneys are
normal, without renal calculi, focal lesion, or hydronephrosis.
Bladder is unremarkable.

Stomach/Bowel: Stomach is mostly nondistended. There is suggestion
of possible focal gastric wall thickening at the pyloric region with
a tiny gas bubble within the wall. This may indicate a gastric
ulcer. No evidence of perforation. Small bowel are mostly
decompressed. Scattered stool throughout the colon without
distention or wall thickening. Scattered diverticula in the colon.
No evidence of diverticulitis. Appendix is not identified.

Lymphatic: No significant lymphadenopathy.

Reproductive: Status post hysterectomy. No adnexal masses.

Other: No free air or free fluid in the abdomen. Abdominal wall
musculature appears intact.

Musculoskeletal: Degenerative changes in the spine. No destructive
bone lesions or acute changes demonstrated.

Review of the MIP images confirms the above findings.
IMPRESSION: 1. No evidence of aneurysm or dissection of the thoracic or
abdominal aorta.
2. Minimal aortic atherosclerosis.
3. Possible small gastric ulcer in the pyloric region. No
perforation.
4. Fatty infiltration of the liver.

## 2018-02-02 DIAGNOSIS — F321 Major depressive disorder, single episode, moderate: Secondary | ICD-10-CM | POA: Diagnosis not present

## 2018-02-02 DIAGNOSIS — E559 Vitamin D deficiency, unspecified: Secondary | ICD-10-CM | POA: Diagnosis not present

## 2018-03-16 ENCOUNTER — Telehealth: Payer: Self-pay

## 2018-03-16 ENCOUNTER — Telehealth: Payer: Self-pay | Admitting: Hematology and Oncology

## 2018-03-16 DIAGNOSIS — I10 Essential (primary) hypertension: Secondary | ICD-10-CM | POA: Diagnosis not present

## 2018-03-16 DIAGNOSIS — E559 Vitamin D deficiency, unspecified: Secondary | ICD-10-CM | POA: Diagnosis not present

## 2018-03-16 DIAGNOSIS — R5383 Other fatigue: Secondary | ICD-10-CM | POA: Diagnosis not present

## 2018-03-16 NOTE — Telephone Encounter (Signed)
Called and left below message. Scheduling message sent. 

## 2018-03-16 NOTE — Telephone Encounter (Signed)
I can see her next Thursday Please send scheduling msg

## 2018-03-16 NOTE — Telephone Encounter (Signed)
She called and left a message to call her.  Called back. She has a knot area on the left side of her chest below her breast that she first noticed 2 weeks ago.  She is requesting earlier appt.

## 2018-03-16 NOTE — Telephone Encounter (Signed)
Scheduled appt per 10/3 sch message - pt is aware of appt date and time

## 2018-03-23 ENCOUNTER — Encounter: Payer: Self-pay | Admitting: Hematology and Oncology

## 2018-03-23 ENCOUNTER — Inpatient Hospital Stay: Payer: BLUE CROSS/BLUE SHIELD

## 2018-03-23 ENCOUNTER — Inpatient Hospital Stay: Payer: BLUE CROSS/BLUE SHIELD | Attending: Hematology and Oncology | Admitting: Hematology and Oncology

## 2018-03-23 ENCOUNTER — Telehealth: Payer: Self-pay | Admitting: Hematology and Oncology

## 2018-03-23 VITALS — BP 170/81 | HR 59 | Temp 98.2°F | Resp 18 | Ht 61.0 in | Wt 133.8 lb

## 2018-03-23 DIAGNOSIS — R634 Abnormal weight loss: Secondary | ICD-10-CM | POA: Diagnosis not present

## 2018-03-23 DIAGNOSIS — R5382 Chronic fatigue, unspecified: Secondary | ICD-10-CM | POA: Diagnosis not present

## 2018-03-23 DIAGNOSIS — C8231 Follicular lymphoma grade IIIa, lymph nodes of head, face, and neck: Secondary | ICD-10-CM

## 2018-03-23 DIAGNOSIS — Z8572 Personal history of non-Hodgkin lymphomas: Secondary | ICD-10-CM | POA: Diagnosis not present

## 2018-03-23 DIAGNOSIS — I10 Essential (primary) hypertension: Secondary | ICD-10-CM | POA: Diagnosis not present

## 2018-03-23 LAB — COMPREHENSIVE METABOLIC PANEL
ALT: 9 U/L (ref 0–44)
AST: 13 U/L — ABNORMAL LOW (ref 15–41)
Albumin: 4.3 g/dL (ref 3.5–5.0)
Alkaline Phosphatase: 88 U/L (ref 38–126)
Anion gap: 12 (ref 5–15)
BUN: 10 mg/dL (ref 6–20)
CO2: 26 mmol/L (ref 22–32)
Calcium: 9.9 mg/dL (ref 8.9–10.3)
Chloride: 103 mmol/L (ref 98–111)
Creatinine, Ser: 0.66 mg/dL (ref 0.44–1.00)
GFR calc Af Amer: >60 mL/min (ref >60)
GFR calc non Af Amer: >60 mL/min (ref >60)
Glucose, Bld: 83 mg/dL (ref 70–99)
Potassium: 3.8 mmol/L (ref 3.5–5.1)
Sodium: 141 mmol/L (ref 135–145)
Total Bilirubin: 0.5 mg/dL (ref 0.3–1.2)
Total Protein: 7.5 g/dL (ref 6.5–8.1)

## 2018-03-23 LAB — CBC WITH DIFFERENTIAL/PLATELET
Abs Immature Granulocytes: 0.02 10*3/uL (ref 0.00–0.07)
Basophils Absolute: 0 10*3/uL (ref 0.0–0.1)
Basophils Relative: 0 %
Eosinophils Absolute: 0.1 10*3/uL (ref 0.0–0.5)
Eosinophils Relative: 2 %
HCT: 45.5 % (ref 36.0–46.0)
Hemoglobin: 14.9 g/dL (ref 12.0–15.0)
Immature Granulocytes: 0 %
Lymphocytes Relative: 19 %
Lymphs Abs: 1.3 10*3/uL (ref 0.7–4.0)
MCH: 31.1 pg (ref 26.0–34.0)
MCHC: 32.7 g/dL (ref 30.0–36.0)
MCV: 95 fL (ref 80.0–100.0)
Monocytes Absolute: 0.4 10*3/uL (ref 0.1–1.0)
Monocytes Relative: 6 %
Neutro Abs: 5.2 10*3/uL (ref 1.7–7.7)
Neutrophils Relative %: 73 %
Platelets: 256 10*3/uL (ref 150–400)
RBC: 4.79 MIL/uL (ref 3.87–5.11)
RDW: 13.1 % (ref 11.5–15.5)
WBC: 7.1 10*3/uL (ref 4.0–10.5)
nRBC: 0 % (ref 0.0–0.2)

## 2018-03-23 LAB — TSH: TSH: 1.24 u[IU]/mL (ref 0.308–3.960)

## 2018-03-23 LAB — T4, FREE: Free T4: 0.79 ng/dL — ABNORMAL LOW (ref 0.82–1.77)

## 2018-03-23 LAB — LACTATE DEHYDROGENASE: LDH: 200 U/L — ABNORMAL HIGH (ref 98–192)

## 2018-03-23 NOTE — Assessment & Plan Note (Signed)
She has significant concern about palpable lymphadenopathy and is worried about recent weight loss that she might have recurrence of lymphoma She has small palpable fullness in the left axilla The fullness she fell over the left rib cage area could be related to recent weight loss.  No specific masses noted I will order blood work today and CT imaging to follow to exclude cancer recurrence

## 2018-03-23 NOTE — Progress Notes (Signed)
[place] OFFICE PROGRESS NOTE  Patient Care Team: Velna Hatchet, MD as PCP - General (Internal Medicine) Leta Baptist, MD as Consulting Physician (Otolaryngology)  ASSESSMENT & PLAN:  Follicular lymphoma grade iiia, lymph nodes of head, face, and neck ([place]) She has significant concern about palpable lymphadenopathy and is worried about recent weight loss that she might have recurrence of lymphoma She has small palpable fullness in the left axilla The fullness she fell over the left rib cage area could be related to recent weight loss.  No specific masses noted I will order blood work today and CT imaging to follow to exclude cancer recurrence  Chronic fatigue She has chronic fatigue and recent weight loss I plan to order repeat thyroid function test to exclude hypothyroidism  Weight loss She has unexplained weight loss.  I plan to order CT imaging to exclude cancer  Essential hypertension Her blood pressure is grossly elevated It could be due to anxiety Her previous blood pressure documented a week ago was within normal limits. She will continue current prescription antihypertensives   Orders Placed This Encounter  Procedures  . CT CHEST W CONTRAST    Standing Status:   Future    Number of Occurrences:   1    Standing Expiration Date:   03/24/2019    Order Specific Question:   If indicated for the ordered procedure, I authorize the administration of contrast media per Radiology protocol    Answer:   Yes    Order Specific Question:   Preferred imaging location?    Answer:   [facility]    Order Specific Question:   Radiology Contrast Protocol - do NOT remove file path    Answer:   \\[email or web address]    Order Specific Question:   ** REASON FOR EXAM (FREE TEXT)    Answer:   weight loss, palpalble LN left axilla    Order Specific Question:   Is patient pregnant?    Answer:   No  . CT ABDOMEN PELVIS W CONTRAST    Standing  Status:   Future    Number of Occurrences:   1    Standing Expiration Date:   03/24/2019    Order Specific Question:   If indicated for the ordered procedure, I authorize the administration of contrast media per Radiology protocol    Answer:   Yes    Order Specific Question:   Preferred imaging location?    Answer:   [facility]    Order Specific Question:   Radiology Contrast Protocol - do NOT remove file path    Answer:   \\[email or web address]    Order Specific Question:   ** REASON FOR EXAM (FREE TEXT)    Answer:   weight loss, palpalble LN left axilla    Order Specific Question:   Is patient pregnant?    Answer:   No  . T4, free    Standing Status:   Future    Number of Occurrences:   1    Standing Expiration Date:   04/27/2019  . TSH    Standing Status:   Future    Number of Occurrences:   1    Standing Expiration Date:   04/27/2019    INTERVAL HISTORY: Please see below for problem oriented charting. She is seen urgently because of concern for palpable lymphadenopathy and recent weight loss She has been feeling well until recently Approximately 2 weeks ago, she palpated fullness over the left upper quadrant  of her abdomen in her axilla She has lost some weight She denies abnormal fever or chills No abnormal night sweats She denies pain.  No changes in bowel habits  SUMMARY OF ONCOLOGIC HISTORY: Oncology History   FLIPI score 0 (low risk)     Follicular lymphoma grade iiia, lymph nodes of head, face, and neck ([place])   03/08/2016 Imaging    Ct neck showed right level IIa lymphadenopathy at site of palpable interest. Additionally, there are prominent right upper cervical and left supraclavicular lymph nodes. This may represent nodal metastasis or be related to a systemic inflammatory or lymphoproliferative process. No infectious/and process in the neck identified. Effacement of the right vallecula an asymmetric thickening of the right  aryepiglottic fold, direct visualization is recommended to evaluate for mucosal lesion.    03/19/2016 Pathology Results    Accession: [id number] Histologic type: Follicular lymphoma, large cell type. Grade (if applicable): High grade, 3 A. Flow cytometry: B cell population with kappa light chain excess ([id number]) Immunohistochemical stains: CD20, CD3, CD10, BCL-2, BCL-6 and Ki-67 with appropriate controls. Touch preps/imprints: A mixture of small and large lymphoid cells. Comments: The sections show effacement of the lymph nodal architecture by numerous variably sized and ill defined atypical lymphoid follicles characterized by lack of polarity, attenuated or absent mantle zones and relatively homogeneous composition of small and large transformed and cleaved lymphoid cells. The number of large lymphoid cells varies in different follicles but generally average more than 15 cells / high power field . This is associated with scattered mitosis. No diffuse component is identified. To further evaluate this process, flow cytometric analysis was performed and shows a B cell population displaying expression of pan B cell antigens including CD20 associated with CD10 and kappa light chain excess. Immunohistochemical stains were performed and show that the lymphoid follicles are positive for CD20, CD10, BCL-6 and BCL-2. Ki 67 shows variable increased expression ranging for 10% to >50% in many of the follicles. The atypical lymphoid follicles are surrounded by an abundant population of T cells as primarily seen with CD3. 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 (BNS:kh 03-23-16)    03/19/2016 Procedure    Direct laryngoscopy was negative for abnormalities. Biopsies are taken from right neck LN    04/12/2016 PET scan    PET scan showed abnormal right station 2A lymph node measuring 1.1 cm in length with maximum SUV of 6.1, Deauville scale 4. There is some symmetric and probably physiologic  activity along the lingual tonsils. Spleen normal, and no other adenopathy identified    05/11/2016 - 06/22/2016 Chemotherapy    She received R-CHOP chemo x 3    07/13/2016 PET scan    Interval resolution of hypermetabolic right cervical level 2A lymph node. No evidence of metabolically active lymphoma.     REVIEW OF SYSTEMS:   Constitutional: Denies fevers, chills or abnormal weight loss Eyes: Denies blurriness of vision Ears, nose, mouth, throat, and face: Denies mucositis or sore throat Respiratory: Denies cough, dyspnea or wheezes Cardiovascular: Denies palpitation, chest discomfort or lower extremity swelling Gastrointestinal:  Denies nausea, heartburn or change in bowel habits Skin: Denies abnormal skin rashes Neurological:Denies numbness, tingling or new weaknesses Behavioral/Psych: Mood is stable, no new changes  All other systems were reviewed with the patient and are negative.  I have reviewed the past medical history, past surgical history, social history and family history with the patient and they are unchanged from previous note.  ALLERGIES:  is allergic to lisinopril;  penicillins; and prednisone.  MEDICATIONS:  Current Outpatient Medications  Medication Sig Dispense Refill  . amLODipine (NORVASC) 10 MG tablet Take 1 tablet (10 mg total) daily by mouth. 30 tablet 9  . B Complex Vitamins (B-COMPLEX/B-12) TABS Take 1 tablet by mouth daily.    Marland Kitchen BIOTIN 5000 PO Take 1 capsule by mouth daily.    . Cholecalciferol (VITAMIN D3) 5000 units CAPS Take 5,000 Units by mouth daily.    Marland Kitchen esomeprazole (NEXIUM) 40 MG capsule Take twice daily for 2 weeks and then once daily as before. 60 capsule 0  . Multiple Vitamin (MULTIVITAMIN WITH MINERALS) TABS tablet Take 1 tablet by mouth daily.    . polyethylene glycol (MIRALAX / GLYCOLAX) packet Take 17 g by mouth every other day.     . telmisartan (MICARDIS) 40 MG tablet Take 40 mg by mouth daily.   4  . traZODone (DESYREL) 100 MG tablet  Take 1 tablet (100 mg total) at bedtime by mouth. (Patient not taking: Reported on 05/20/2017) 30 tablet 3   Current Facility-Administered Medications  Medication Dose Route Frequency Provider Last Rate Last Dose  . 0.9 %  sodium chloride infusion  500 mL Intravenous Once Doran Stabler, MD        PHYSICAL EXAMINATION: ECOG PERFORMANCE STATUS: 1 - Symptomatic but completely ambulatory  Vitals:   03/23/18 0947  BP: (!) 170/81  Pulse: (!) 59  Resp: 18  Temp: 98.2 F (36.8 C)  SpO2: 100%   Filed Weights   03/23/18 0947  Weight: 133 lb 12.8 oz (60.7 kg)    GENERAL:alert, no distress and comfortable SKIN: skin color, texture, turgor are normal, no rashes or significant lesions.  She looked hand EYES: normal, Conjunctiva are pink and non-injected, sclera clear OROPHARYNX:no exudate, no erythema and lips, buccal mucosa, and tongue normal  NECK: supple, thyroid normal size, non-tender, without nodularity LYMPH: Palpable fullness on the left axilla  lUNGS: clear to auscultation and percussion with normal breathing effort HEART: regular rate & rhythm and no murmurs and no lower extremity edema ABDOMEN:abdomen soft, non-tender and normal bowel sounds.  The area of concern over the left upper quadrant is actually prominent rib cage of her chest wall Musculoskeletal:no cyanosis of digits and no clubbing  NEURO: alert & oriented x 3 with fluent speech, no focal motor/sensory deficits  LABORATORY DATA:  I have reviewed the data as listed    Component Value Date/Time   NA 136 11/21/2017 1436   NA 137 04/22/2017 1407   K 4.3 11/21/2017 1436   K 5.0 04/22/2017 1407   CL 101 11/21/2017 1436   CO2 25 11/21/2017 1436   CO2 24 04/22/2017 1407   GLUCOSE 76 11/21/2017 1436   GLUCOSE 77 04/22/2017 1407   BUN 15 11/21/2017 1436   BUN 21.6 04/22/2017 1407   CREATININE 0.75 11/21/2017 1436   CREATININE 0.8 04/22/2017 1407   CALCIUM 9.8 11/21/2017 1436   CALCIUM 9.5 04/22/2017 1407    PROT 7.1 11/21/2017 1436   PROT 7.1 04/22/2017 1407   ALBUMIN 4.3 11/21/2017 1436   ALBUMIN 3.9 04/22/2017 1407   AST 15 11/21/2017 1436   AST 18 04/22/2017 1407   ALT 9 11/21/2017 1436   ALT 16 04/22/2017 1407   ALKPHOS 77 11/21/2017 1436   ALKPHOS 91 04/22/2017 1407   BILITOT 0.2 11/21/2017 1436   BILITOT 0.55 04/22/2017 1407   GFRNONAA >60 11/21/2017 1436   GFRAA >60 11/21/2017 1436    No results found  for: SPEP, UPEP  Lab Results  Component Value Date   WBC 5.4 11/21/2017   NEUTROABS 3.7 11/21/2017   HGB 14.2 11/21/2017   HCT 42.4 11/21/2017   MCV 94.2 11/21/2017   PLT 190 11/21/2017      Chemistry      Component Value Date/Time   NA 136 11/21/2017 1436   NA 137 04/22/2017 1407   K 4.3 11/21/2017 1436   K 5.0 04/22/2017 1407   CL 101 11/21/2017 1436   CO2 25 11/21/2017 1436   CO2 24 04/22/2017 1407   BUN 15 11/21/2017 1436   BUN 21.6 04/22/2017 1407   CREATININE 0.75 11/21/2017 1436   CREATININE 0.8 04/22/2017 1407      Component Value Date/Time   CALCIUM 9.8 11/21/2017 1436   CALCIUM 9.5 04/22/2017 1407   ALKPHOS 77 11/21/2017 1436   ALKPHOS 91 04/22/2017 1407   AST 15 11/21/2017 1436   AST 18 04/22/2017 1407   ALT 9 11/21/2017 1436   ALT 16 04/22/2017 1407   BILITOT 0.2 11/21/2017 1436   BILITOT 0.55 04/22/2017 1407     All questions were answered. The patient knows to call the clinic with any problems, questions or concerns. No barriers to learning was detected.  I spent 20 minutes counseling the patient face to face. The total time spent in the appointment was 30 minutes and more than 50% was on counseling and review of test results  Heath Lark, MD 03/23/2018 10:06 AM

## 2018-03-23 NOTE — Assessment & Plan Note (Signed)
She has unexplained weight loss.  I plan to order CT imaging to exclude cancer

## 2018-03-23 NOTE — Assessment & Plan Note (Signed)
Her blood pressure is grossly elevated It could be due to anxiety Her previous blood pressure documented a week ago was within normal limits. She will continue current prescription antihypertensives

## 2018-03-23 NOTE — Assessment & Plan Note (Signed)
She has chronic fatigue and recent weight loss I plan to order repeat thyroid function test to exclude hypothyroidism

## 2018-03-23 NOTE — Telephone Encounter (Signed)
Gave pt avs and calendar  °

## 2018-03-29 ENCOUNTER — Telehealth: Payer: Self-pay

## 2018-03-29 ENCOUNTER — Encounter (HOSPITAL_COMMUNITY): Payer: Self-pay

## 2018-03-29 ENCOUNTER — Ambulatory Visit (HOSPITAL_COMMUNITY)
Admission: RE | Admit: 2018-03-29 | Discharge: 2018-03-29 | Disposition: A | Discharge status: Home / Self Care | Payer: BLUE CROSS/BLUE SHIELD | Source: Ambulatory Visit | Attending: Hematology and Oncology | Admitting: Hematology and Oncology

## 2018-03-29 DIAGNOSIS — I700 Atherosclerosis of aorta: Secondary | ICD-10-CM | POA: Diagnosis not present

## 2018-03-29 DIAGNOSIS — K5900 Constipation, unspecified: Secondary | ICD-10-CM | POA: Diagnosis not present

## 2018-03-29 DIAGNOSIS — J432 Centrilobular emphysema: Secondary | ICD-10-CM | POA: Diagnosis not present

## 2018-03-29 DIAGNOSIS — J439 Emphysema, unspecified: Secondary | ICD-10-CM | POA: Diagnosis not present

## 2018-03-29 DIAGNOSIS — R5382 Chronic fatigue, unspecified: Secondary | ICD-10-CM | POA: Insufficient documentation

## 2018-03-29 DIAGNOSIS — C8231 Follicular lymphoma grade IIIa, lymph nodes of head, face, and neck: Secondary | ICD-10-CM | POA: Diagnosis not present

## 2018-03-29 MED ORDER — SODIUM CHLORIDE 0.9 % IJ SOLN
INTRAMUSCULAR | Status: AC
  Filled 2018-03-29: qty 50

## 2018-03-29 MED ORDER — IOHEXOL 300 MG/ML  SOLN
100.0000 mL | Freq: Once | INTRAMUSCULAR | Status: AC | PRN
  Administered 2018-03-29: 100 mL via INTRAVENOUS

## 2018-03-29 NOTE — Telephone Encounter (Signed)
Called and left a message asking to call the office regarding follow up appt.

## 2018-03-30 ENCOUNTER — Telehealth: Payer: Self-pay | Admitting: Hematology and Oncology

## 2018-03-30 ENCOUNTER — Telehealth: Payer: Self-pay

## 2018-03-30 NOTE — Telephone Encounter (Signed)
-----   Message from Heath Lark, MD sent at 03/30/2018  9:42 AM EDT ----- Regarding: CT Ct showed no lymphoma. Radiologist suggest she might have stomach gastritis and recommend upper Gi EGD. Can she call her GI doctor to arrange that?

## 2018-03-30 NOTE — Telephone Encounter (Signed)
Called and given below message. She verbalized understanding. She will contact her GI doctor. Scheduling message sent for appt in 6 months.

## 2018-03-30 NOTE — Telephone Encounter (Signed)
Appts scheduled letter/calendar mailed per 10/17 sch msg

## 2018-04-04 ENCOUNTER — Encounter: Payer: Self-pay | Admitting: Gastroenterology

## 2018-04-04 ENCOUNTER — Ambulatory Visit (INDEPENDENT_AMBULATORY_CARE_PROVIDER_SITE_OTHER): Payer: BLUE CROSS/BLUE SHIELD | Admitting: Gastroenterology

## 2018-04-04 VITALS — BP 122/72 | HR 76 | Ht 61.0 in | Wt 133.4 lb

## 2018-04-04 DIAGNOSIS — R1310 Dysphagia, unspecified: Secondary | ICD-10-CM

## 2018-04-04 DIAGNOSIS — R634 Abnormal weight loss: Secondary | ICD-10-CM

## 2018-04-04 DIAGNOSIS — R12 Heartburn: Secondary | ICD-10-CM

## 2018-04-04 DIAGNOSIS — G8929 Other chronic pain: Secondary | ICD-10-CM

## 2018-04-04 DIAGNOSIS — K5909 Other constipation: Secondary | ICD-10-CM

## 2018-04-04 DIAGNOSIS — R1319 Other dysphagia: Secondary | ICD-10-CM

## 2018-04-04 DIAGNOSIS — R1013 Epigastric pain: Secondary | ICD-10-CM

## 2018-04-04 NOTE — Patient Instructions (Signed)
If you are age 58 or older, your body mass index should be between 23-30. Your Body mass index is 25.2 kg/m. If this is out of the aforementioned range listed, please consider follow up with your Primary Care Provider.  If you are age 89 or younger, your body mass index should be between 19-25. Your Body mass index is 25.2 kg/m. If this is out of the aformentioned range listed, please consider follow up with your Primary Care Provider.   You have been scheduled for an endoscopy. Please follow written instructions given to you at your visit today. If you use inhalers (even only as needed), please bring them with you on the day of your procedure. Your physician has requested that you go to www.[email or web address].com and enter the access code given to you at your visit today. This web site gives a general overview about your procedure. However, you should still follow specific instructions given to you by our office regarding your preparation for the procedure.  It was a pleasure to see you today!  Dr. Loletha Carrow

## 2018-04-04 NOTE — Progress Notes (Signed)
[facility] Progress Note  Chief Complaint: Abdominal pain, dysphagia and heartburn  Subjective  History:  Emily Santiago was referred back to [place] by her oncologist after recent evaluation.  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 There were also multiple shallow Gastric ulcers.Gastric bx neg for H pylori.  Subsequent esophageal manometry Showed normal LES resting pressure and relaxation, 80% swallows were peristaltic.  No manometric evidence of dysmotility seen. Colonoscopy summer 2018 for family history of colorectal cancer revealed a flat adenomatous polyp removed piecemeal from the ascending colon, and a smaller sigmoid colon adenoma.  Recommendation was for repeat colonoscopy in 3 years. Emily Santiago recently saw oncology for concern of palpable fullness in left upper quadrant of abdomen and in the left axilla with some recent weight loss.  She then underwent CT scan with report below.  Emily Santiago has been bothered by ongoing heartburn refractory to once daily Nexium.  She also has intermittent dysphagia to solids and liquids, and is often noted that it occurs with increased stress.  She has been going through a difficult time and recently separated from her husband.  She denies any abuse.  She has had chronic constipation as before, and sometimes has "shooting pains" in various locations of the abdomen that she describes a sharp.  She has had some dull epigastric discomfort as well.  She was also concerned about the recent CT scan findings.  ROS: Cardiovascular:  no chest pain Respiratory: no dyspnea She has had dysphoric mood lately, with mood swings, crying and insomnia with loss of activity  interest.  Oncology recently prescribed her some trazodone, which has improved the sleep somewhat, she still has the mood symptoms as well.  Remainder of systems negative except as above  The patient's Past Medical, Family and Social History were reviewed and are on file in the EMR. Unfortunately, she continues to smoke.  Objective:  Med list reviewed  Current Outpatient Medications:  .  amLODipine (NORVASC) 10 MG tablet, Take 1 tablet (10 mg total) daily by mouth., Disp: 30 tablet, Rfl: 9 .  B Complex Vitamins (B-COMPLEX/B-12) TABS, Take 1 tablet by mouth daily., Disp: , Rfl:  .  BIOTIN 5000 PO, Take 1 capsule by mouth daily., Disp: , Rfl:  .  Cholecalciferol (VITAMIN D3) 5000 units CAPS, Take 5,000 Units by mouth daily., Disp: , Rfl:  .  esomeprazole (NEXIUM) 40 MG capsule, Take twice daily for 2 weeks and then once daily as before. (Patient taking differently: daily. Take twice daily for 2 weeks and then once daily as before.), Disp: 60 capsule, Rfl: 0 .  Multiple Vitamin (MULTIVITAMIN WITH MINERALS) TABS tablet, Take 1 tablet by mouth daily., Disp: , Rfl:  .  polyethylene glycol (MIRALAX / GLYCOLAX) packet, Take 17 g by mouth every other day. , Disp: , Rfl:  .  telmisartan (MICARDIS) 40 MG tablet, Take 40 mg by mouth daily. , Disp: , Rfl: 4 .  traZODone (DESYREL) 100 MG tablet, Take 1 tablet (100 mg total) at bedtime by mouth., Disp: 30 tablet, Rfl: 3   Vital signs in last 24 hrs: Vitals:   04/04/18 1054  BP: 122/72  Pulse: 76    Physical Exam  Decreased affect, but pleasant  and conversational.  HEENT: sclera anicteric, oral mucosa moist without lesions  Neck: supple, no thyromegaly, JVD or lymphadenopathy  Cardiac: RRR without murmurs, S1S2 heard, no peripheral edema  Pulm: clear to auscultation bilaterally, normal RR and effort noted  Abdomen: soft, epigastric tenderness, with active bowel sounds. No guarding or palpable hepatosplenomegaly.  Skin; warm and dry, no  jaundice or rash  Recent Labs:  CBC Latest Ref Rng & Units 03/23/2018 11/21/2017 07/25/2017  WBC 4.0 - 10.5 K/uL 7.1 5.4 6.6  Hemoglobin 12.0 - 15.0 g/dL 14.9 14.2 13.8  Hematocrit 36.0 - 46.0 % 45.5 42.4 41.9  Platelets 150 - 400 K/uL 256 190 201   CMP Latest Ref Rng & Units 03/23/2018 11/21/2017 07/25/2017  Glucose 70 - 99 mg/dL 83 76 76  BUN 6 - 20 mg/dL 10 15 18   Creatinine 0.44 - 1.00 mg/dL 0.66 0.75 0.82  Sodium 135 - 145 mmol/L 141 136 138  Potassium 3.5 - 5.1 mmol/L 3.8 4.3 4.0  Chloride 98 - 111 mmol/L 103 101 104  CO2 22 - 32 mmol/L 26 25 23   Calcium 8.9 - 10.3 mg/dL 9.9 9.8 9.0  Total Protein 6.5 - 8.1 g/dL 7.5 7.1 6.8  Total Bilirubin 0.3 - 1.2 mg/dL 0.5 0.2 0.2  Alkaline Phos 38 - 126 U/L 88 77 83  AST 15 - 41 U/L 13(L) 15 19  ALT 0 - 44 U/L 9 9 12     Radiologic studies:  03/29/18 CT scan:   COMPARISON:  PET 07/13/2016. Chest CT 02/18/2017. Most recent diagnostic CT of the abdomen and pelvis 06/30/2012.   FINDINGS: CT CHEST FINDINGS   Cardiovascular: Aortic atherosclerosis. Tortuous thoracic aorta. Normal heart size, without pericardial effusion. No central pulmonary embolism, on this non-dedicated study.   Mediastinum/Nodes: Low left jugular nodes of maximally 6 mm are similar and not pathologic by size criteria. No subpectoral or axillary adenopathy. No mediastinal or hilar adenopathy.   Lungs/Pleura: No pleural fluid.  Mild centrilobular emphysema.   Clear lungs.   Musculoskeletal: No acute osseous abnormality.   CT ABDOMEN PELVIS FINDINGS   Hepatobiliary: High right hepatic lobe too small to characterize lesion. Hypoattenuating lesion in the central left hepatic lobe of 7 mm on image 43/2 is similar on 06/30/2012 and can be presumed benign.   Cholecystectomy, without biliary ductal dilatation.   Pancreas: Normal, without mass or ductal dilatation.   Spleen: Normal in size, without focal abnormality.   Adrenals/Urinary Tract: Normal adrenal  glands. Too small to characterize left renal lesions. Normal right kidney. No hydronephrosis. Normal urinary bladder.   Stomach/Bowel: The gastric antrum appears thick walled, but is underdistended. Example image 59/2. This is similar on 06/30/2012.   Colonic stool burden suggests constipation. Normal terminal ileum. Appendix not visualized. Normal small bowel.   Vascular/Lymphatic: Aortic and branch vessel atherosclerosis. Circumaortic left renal vein. No abdominopelvic adenopathy.   Reproductive: Hysterectomy.  No adnexal mass.   Other: No significant free fluid.   Musculoskeletal: Mild osteopenia.  Thoracolumbar spondylosis.   IMPRESSION: 1. No findings to suggest residual or recurrent lymphoma in the chest, abdomen, or pelvis. 2. Gastric antral wall thickening, at least partially felt to be due to underdistention. Correlate with symptoms of gastritis. Consider endoscopy depending on clinical symptoms. Of note, the appearance is grossly similar to 2014. 3.  Possible constipation. 4. Aortic atherosclerosis (ICD10-I70.0) and emphysema (ICD10-J43.9).     Electronically Signed   By: Abigail Miyamoto M.D.   On: 03/30/2018 08:57  @ASSESSMENTPLANBEGIN @ Assessment: Encounter Diagnoses  Name Primary?  . Abdominal pain, chronic, epigastric Yes  . Esophageal dysphagia   . Chronic constipation   . Abnormal loss of weight   . Heartburn    The CT scan findings of distal stomach wall thickening may very well just be under distention, but she also has a history of gastric ulcers.  She is also continue to use ibuprofen regularly, so ulcers might still be present.  I encouraged her to speak with primary care about alternate pain control.  She also needs to stop smoking if possible.  Her dysphagia is more puzzling, since she previously had no mechanical or motility explanation for it.  There may still be some dysmotility that is intermittent, and possibly related to either reflux or smoking  or both.  There seems to be some anxiety overlay as well.  Chronic constipation needs adequate dietary fiber, increased water intake and physical exercise.  As needed MiraLAX can be used as well.  Plan: Upper endoscopy to rule out persistent ulcers and, less likely, small bowel lymphoma. She is agreeable after discussion of procedure and risks.   Total time 40 minutes, over half spent face-to-face with patient in counseling and coordination of care.   [name]

## 2018-04-22 DIAGNOSIS — B9689 Other specified bacterial agents as the cause of diseases classified elsewhere: Secondary | ICD-10-CM | POA: Diagnosis not present

## 2018-04-22 DIAGNOSIS — J329 Chronic sinusitis, unspecified: Secondary | ICD-10-CM | POA: Diagnosis not present

## 2018-04-22 DIAGNOSIS — H109 Unspecified conjunctivitis: Secondary | ICD-10-CM | POA: Diagnosis not present

## 2018-04-25 DIAGNOSIS — J449 Chronic obstructive pulmonary disease, unspecified: Secondary | ICD-10-CM | POA: Diagnosis not present

## 2018-04-25 DIAGNOSIS — K219 Gastro-esophageal reflux disease without esophagitis: Secondary | ICD-10-CM | POA: Diagnosis not present

## 2018-04-25 DIAGNOSIS — J209 Acute bronchitis, unspecified: Secondary | ICD-10-CM | POA: Diagnosis not present

## 2018-04-25 DIAGNOSIS — R05 Cough: Secondary | ICD-10-CM | POA: Diagnosis not present

## 2018-04-28 ENCOUNTER — Encounter: Payer: BLUE CROSS/BLUE SHIELD | Admitting: Gastroenterology

## 2018-05-15 ENCOUNTER — Encounter: Payer: Self-pay | Admitting: Gastroenterology

## 2018-05-15 ENCOUNTER — Ambulatory Visit (AMBULATORY_SURGERY_CENTER): Payer: BLUE CROSS/BLUE SHIELD | Admitting: Gastroenterology

## 2018-05-15 VITALS — BP 130/71 | HR 82 | Temp 98.0°F | Resp 15 | Ht 61.0 in | Wt 133.0 lb

## 2018-05-15 DIAGNOSIS — R1310 Dysphagia, unspecified: Secondary | ICD-10-CM | POA: Diagnosis not present

## 2018-05-15 DIAGNOSIS — K2970 Gastritis, unspecified, without bleeding: Secondary | ICD-10-CM

## 2018-05-15 DIAGNOSIS — K3189 Other diseases of stomach and duodenum: Secondary | ICD-10-CM | POA: Diagnosis not present

## 2018-05-15 DIAGNOSIS — K259 Gastric ulcer, unspecified as acute or chronic, without hemorrhage or perforation: Secondary | ICD-10-CM | POA: Diagnosis not present

## 2018-05-15 DIAGNOSIS — R1319 Other dysphagia: Secondary | ICD-10-CM

## 2018-05-15 DIAGNOSIS — R1013 Epigastric pain: Secondary | ICD-10-CM

## 2018-05-15 DIAGNOSIS — K2270 Barrett's esophagus without dysplasia: Secondary | ICD-10-CM | POA: Diagnosis not present

## 2018-05-15 MED ORDER — SODIUM CHLORIDE 0.9 % IV SOLN: 500.0000 mL | INTRAVENOUS | Status: DC

## 2018-05-15 NOTE — Patient Instructions (Signed)
Discharge instructions given. Biopsies taken. Resume previous medications. YOU HAD AN ENDOSCOPIC PROCEDURE TODAY AT THE [facility]:   Refer to the procedure report that was given to you for any specific questions about what was found during the examination.  If the procedure report does not answer your questions, please call your gastroenterologist to clarify.  If you requested that your care partner not be given the details of your procedure findings, then the procedure report has been included in a sealed envelope for you to review at your convenience later.  YOU SHOULD EXPECT: Some feelings of bloating in the abdomen. Passage of more gas than usual.  Walking can help get rid of the air that was put into your GI tract during the procedure and reduce the bloating. If you had a lower endoscopy (such as a colonoscopy or flexible sigmoidoscopy) you may notice spotting of blood in your stool or on the toilet paper. If you underwent a bowel prep for your procedure, you may not have a normal bowel movement for a few days.  Please Note:  You might notice some irritation and congestion in your nose or some drainage.  This is from the oxygen used during your procedure.  There is no need for concern and it should clear up in a day or so.  SYMPTOMS TO REPORT IMMEDIATELY:   Following upper endoscopy (EGD)  Vomiting of blood or coffee ground material  New chest pain or pain under the shoulder blades  Painful or persistently difficult swallowing  New shortness of breath  Fever of 100F or higher  Black, tarry-looking stools  For urgent or emergent issues, a gastroenterologist can be reached at any hour by calling [phone].   DIET:  We do recommend a small meal at first, but then you may proceed to your regular diet.  Drink plenty of fluids but you should avoid alcoholic beverages for 24 hours.  ACTIVITY:  You should plan to take it easy for the rest of today and you should NOT DRIVE  or use heavy machinery until tomorrow (because of the sedation medicines used during the test).    FOLLOW UP: Our staff will call the number listed on your records the next business day following your procedure to check on you and address any questions or concerns that you may have regarding the information given to you following your procedure. If we do not reach you, we will leave a message.  However, if you are feeling well and you are not experiencing any problems, there is no need to return our call.  We will assume that you have returned to your regular daily activities without incident.  If any biopsies were taken you will be contacted by phone or by letter within the next 1-3 weeks.  Please call us at [phone] if you have not heard about the biopsies in 3 weeks.    SIGNATURES/CONFIDENTIALITY: You and/or your care partner have signed paperwork which will be entered into your electronic medical record.  These signatures attest to the fact that that the information above on your After Visit Summary has been reviewed and is understood.  Full responsibility of the confidentiality of this discharge information lies with you and/or your care-partner. 

## 2018-05-15 NOTE — Progress Notes (Signed)
Report given to PACU, vss 

## 2018-05-15 NOTE — Progress Notes (Signed)
Called to room to assist during endoscopic procedure.  Patient ID and intended procedure confirmed with present staff. Received instructions for my participation in the procedure from the performing physician.  

## 2018-05-15 NOTE — Op Note (Signed)
[facility] [place] Patient Name: Emily Santiago Procedure Date: 05/15/2018 2:43 PM MRN: [id number] Endoscopist: Arthur. Loletha Carrow , MD Age: 57 Referring MD:  Date of Birth: Nov 06, 1960 Gender: Female Account #: [id number] Procedure:                Upper GI endoscopy Indications:              Epigastric abdominal pain, Abnormal CT of the GI                            tract (suggesting gastric wall thickening vs                            under-distension), Esophageal dysphagia (workup in                            2017 with EGD showing tortuous distal esophageal                            anatomy and normal manometry)                           prior gastric ulcers from NSAID use (negative for H                            pylori) Medicines:                Monitored Anesthesia Care Procedure:                Pre-Anesthesia Assessment:                           - Prior to the procedure, a History and Physical                            was performed, and patient medications and                            allergies were reviewed. The patient's tolerance of                            previous anesthesia was also reviewed. The risks                            and benefits of the procedure and the sedation                            options and risks were discussed with the patient.                            All questions were answered, and informed consent                            was obtained. Prior Anticoagulants: The patient has  taken no previous anticoagulant or antiplatelet                            agents. ASA Grade Assessment: II - A patient with                            mild systemic disease. After reviewing the risks                            and benefits, the patient was deemed in                            satisfactory condition to undergo the procedure.                           After obtaining informed consent, the endoscope was         passed under direct vision. Throughout the                            procedure, the patient's blood pressure, pulse, and                            oxygen saturations were monitored continuously. The                            Endoscope was introduced through the mouth, and                            advanced to the second part of duodenum. The upper                            GI endoscopy was accomplished without difficulty.                            The patient tolerated the procedure well. Scope In: Scope Out: Findings:                 One tongue of salmon-colored mucosa was present at                            38 cm. No other visible abnormalities were present.                            The maximum longitudinal extent of these esophageal                            mucosal changes was 1 cm in length. Biopsies were                            taken with a cold forceps for histology.                           The exam of the esophagus was otherwise normal  aside from distal tortuosity.                           Few non-bleeding superficial gastric ulcers were                            found in the gastric antrum.                           Diffuse inflammation characterized by adherent                            blood was found in the gastric body and in the                            gastric antrum.                           The cardia and gastric fundus were normal on                            retroflexion.                           The examined duodenum was normal. Complications:            No immediate complications. Estimated Blood Loss:     Estimated blood loss was minimal. Impression:               - Salmon-colored mucosa suspicious for                            short-segment Barrett's esophagus. Biopsied.                           - Non-bleeding gastric ulcers.                           - Gastritis.                           - Normal  examined duodenum.                           Persistent ulcers due to persistent NSAID use.                            Suspected intermittent esophageal dysmotility. Recommendation:           - Patient has a contact number available for                            emergencies. The signs and symptoms of potential                            delayed complications were discussed with the  patient. Return to normal activities tomorrow.                            Written discharge instructions were provided to the                            patient.                           - Resume previous diet.                           - Continue present medications.                           - Await pathology results.                           - Discontinue aspirin and NSAIDs. Discuss alternate                            pain control with primary care.                           - Discontinue the use of any products containing                            nicotine. Amaiyah Nordhoff L. Loletha Carrow, MD 05/15/2018 3:16:34 PM This report has been signed electronically.

## 2018-05-16 ENCOUNTER — Telehealth: Payer: Self-pay

## 2018-05-16 NOTE — Telephone Encounter (Signed)
  Follow up Call-  Call back number 05/15/2018 05/20/2017 04/23/2016  Post procedure Call Back phone  # [phone]  Permission to leave phone message Yes Yes Yes  Some recent data might be hidden     Patient questions:  Do you have a fever, pain , or abdominal swelling? No. Pain Score  0 *  Have you tolerated food without any problems? Yes.    Have you been able to return to your normal activities? Yes.    Do you have any questions about your discharge instructions: Diet   No. Medications  No. Follow up visit  No.  Do you have questions or concerns about your Care? No.  Actions: * If pain score is 4 or above: No action needed, pain <4.

## 2018-05-23 ENCOUNTER — Other Ambulatory Visit: Payer: BLUE CROSS/BLUE SHIELD

## 2018-05-23 ENCOUNTER — Ambulatory Visit: Payer: BLUE CROSS/BLUE SHIELD | Admitting: Hematology and Oncology

## 2018-05-24 ENCOUNTER — Telehealth: Payer: Self-pay | Admitting: Gastroenterology

## 2018-05-24 ENCOUNTER — Encounter: Payer: Self-pay | Admitting: Gastroenterology

## 2018-05-25 NOTE — Telephone Encounter (Signed)
error 

## 2018-06-19 DIAGNOSIS — R82998 Other abnormal findings in urine: Secondary | ICD-10-CM | POA: Diagnosis not present

## 2018-06-19 DIAGNOSIS — N390 Urinary tract infection, site not specified: Secondary | ICD-10-CM | POA: Diagnosis not present

## 2018-06-20 ENCOUNTER — Encounter: Payer: Self-pay | Admitting: Gastroenterology

## 2018-06-20 ENCOUNTER — Ambulatory Visit (INDEPENDENT_AMBULATORY_CARE_PROVIDER_SITE_OTHER): Payer: BLUE CROSS/BLUE SHIELD | Admitting: Gastroenterology

## 2018-06-20 VITALS — BP 130/78 | HR 76 | Ht 61.0 in | Wt 138.0 lb

## 2018-06-20 DIAGNOSIS — K22719 Barrett's esophagus with dysplasia, unspecified: Secondary | ICD-10-CM | POA: Diagnosis not present

## 2018-06-20 DIAGNOSIS — R1310 Dysphagia, unspecified: Secondary | ICD-10-CM | POA: Diagnosis not present

## 2018-06-20 DIAGNOSIS — R1319 Other dysphagia: Secondary | ICD-10-CM

## 2018-06-20 DIAGNOSIS — R12 Heartburn: Secondary | ICD-10-CM

## 2018-06-20 NOTE — Patient Instructions (Signed)
If you are age 59 or older, your body mass index should be between 23-30. Your Body mass index is 26.07 kg/m. If this is out of the aforementioned range listed, please consider follow up with your Primary Care Provider.  If you are age 48 or younger, your body mass index should be between 19-25. Your Body mass index is 26.07 kg/m. If this is out of the aformentioned range listed, please consider follow up with your Primary Care Provider.   It was a pleasure to see you today!  Dr. Loletha Carrow

## 2018-06-20 NOTE — Progress Notes (Signed)
[name]  Chief Complaint: GERD  Subjective  History:  Emily Santiago saw me on 04/04/2018 with worsening pyrosis and regurgitation.  She had previously had upper endoscopy showed tortuous distal esophagus and possible dysmotility.  Esophageal manometry afterwards was normal. Upper endoscopy on 05/15/2018 showed persistent antral ulcers, biopsy negative for H. pylori past, so it was not biopsied again.  There was a 1 cm segment of possible Barrett's, and biopsy confirmed intestinal metaplasia that was "indefinite for dysplasia".  I therefore increased her Nexium to twice daily and sent her a result letter recommending repeat upper endoscopy at a 38-month interval. Unfortunately, at the time of her recent evaluation, she continued to smoke and take NSAIDs regularly.  She is again accompanied by her mother, and her symptoms at the same as before, with persistent heartburn and intermittent solid food dysphagia.  She still smokes and takes BC powders. She was very concerned about the Barrett's I center and wanted to discuss it further. ROS: Cardiovascular:  no chest pain Respiratory: no dyspnea  The patient's Past Medical, Family and Social History were reviewed and are on file in the EMR.  Objective:  Med list reviewed  Current Outpatient Medications:  .  amLODipine (NORVASC) 10 MG tablet, Take 1 tablet (10 mg total) daily by mouth., Disp: 30 tablet, Rfl: 9 .  B Complex Vitamins (B-COMPLEX/B-12) TABS, Take 1 tablet by mouth daily., Disp: , Rfl:  .  benzonatate (TESSALON) 100 MG capsule, TAKE 1 CAPSULE BY MOUTH EVERY 8 HOURS AS NEEDED FOR COUGH, Disp: , Rfl: 0 .  BIOTIN 5000 PO, Take 1 capsule by mouth daily., Disp: , Rfl:  .  Cholecalciferol (VITAMIN D3) 5000 units CAPS, Take 5,000 Units by mouth daily., Disp: , Rfl:  .  escitalopram (LEXAPRO) 10 MG tablet, TAKE 1 TABLET BY MOUTH EVERY DAY AS NEEDED FOR ANXIETY, Disp: , Rfl: 6 .  esomeprazole (NEXIUM) 40 MG capsule, Take  twice daily for 2 weeks and then once daily as before. (Patient taking differently: daily. Take twice daily for 2 weeks and then once daily as before.), Disp: 60 capsule, Rfl: 0 .  hydrOXYzine (ATARAX/VISTARIL) 25 MG tablet, TAKE 1 TO 2 TABS 3 TIMES DAILY AS NEEDED FOR ANXIETY/INSOMNIA, Disp: , Rfl: 3 .  Multiple Vitamin (MULTIVITAMIN WITH MINERALS) TABS tablet, Take 1 tablet by mouth daily., Disp: , Rfl:  .  polyethylene glycol (MIRALAX / GLYCOLAX) packet, Take 17 g by mouth every other day. , Disp: , Rfl:  .  telmisartan (MICARDIS) 40 MG tablet, Take 40 mg by mouth daily. , Disp: , Rfl: 4 .  zolpidem (AMBIEN) 10 MG tablet, TAKE 1/2 TO 1 TABLET BY MOUTH AT BEDTIME FOR INSOMNIA, Disp: , Rfl: 4   Vital signs in last 24 hrs: Vitals:   06/20/18 1609  BP: 130/78  Pulse: 76    Physical Exam Exam today.  Entire minute visit spent in discussion regarding her results and plan.  Biopsy as noted above.  Short segment Barrett's esophagus indefinite for dysplasia.  @ASSESSMENTPLANBEGIN @ Assessment: Encounter Diagnoses  Name Primary?  . Esophageal dysphagia Yes  . Heartburn   . Barrett's esophagus with dysplasia    She has persistent symptoms because of visceral hypersensitivity, ongoing tobacco and NSAID use.  She clearly describes how even when she just gets anxious or upset, she can have heartburn or dysphagia.   Plan: She is on maximal acid suppression, needs to make diet and lifestyle changes as recommended, and is again  referred to primary care to discuss possible coping mechanisms for chronic pain and dependency on BC powder. We will recall her in another 5 months for a follow-up upper endoscopy with biopsies.  [name]

## 2018-08-03 DIAGNOSIS — R05 Cough: Secondary | ICD-10-CM | POA: Diagnosis not present

## 2018-08-03 DIAGNOSIS — Z6825 Body mass index (BMI) 25.0-25.9, adult: Secondary | ICD-10-CM | POA: Diagnosis not present

## 2018-08-03 DIAGNOSIS — J069 Acute upper respiratory infection, unspecified: Secondary | ICD-10-CM | POA: Diagnosis not present

## 2018-09-11 DIAGNOSIS — E559 Vitamin D deficiency, unspecified: Secondary | ICD-10-CM | POA: Diagnosis not present

## 2018-09-11 DIAGNOSIS — E7849 Other hyperlipidemia: Secondary | ICD-10-CM | POA: Diagnosis not present

## 2018-09-11 DIAGNOSIS — I10 Essential (primary) hypertension: Secondary | ICD-10-CM | POA: Diagnosis not present

## 2018-09-13 ENCOUNTER — Telehealth: Payer: Self-pay

## 2018-09-13 NOTE — Telephone Encounter (Signed)
-----   Message from Heath Lark, MD sent at 09/13/2018  9:53 AM EDT ----- Regarding: move her appt to July Pls call her and move her appt to July

## 2018-09-13 NOTE — Telephone Encounter (Signed)
Called and left below message. Ask to call the office back.

## 2018-09-14 DIAGNOSIS — R82998 Other abnormal findings in urine: Secondary | ICD-10-CM | POA: Diagnosis not present

## 2018-09-14 DIAGNOSIS — I10 Essential (primary) hypertension: Secondary | ICD-10-CM | POA: Diagnosis not present

## 2018-09-14 NOTE — Telephone Encounter (Signed)
Called and given below message. She is doing well and glad to change appt. Scheduling message sent.

## 2018-09-18 DIAGNOSIS — E785 Hyperlipidemia, unspecified: Secondary | ICD-10-CM | POA: Diagnosis not present

## 2018-09-18 DIAGNOSIS — Z1331 Encounter for screening for depression: Secondary | ICD-10-CM | POA: Diagnosis not present

## 2018-09-18 DIAGNOSIS — K219 Gastro-esophageal reflux disease without esophagitis: Secondary | ICD-10-CM | POA: Diagnosis not present

## 2018-09-18 DIAGNOSIS — I10 Essential (primary) hypertension: Secondary | ICD-10-CM | POA: Diagnosis not present

## 2018-09-18 DIAGNOSIS — Z0000 Encounter for general adult medical examination without abnormal findings: Secondary | ICD-10-CM | POA: Diagnosis not present

## 2018-09-18 DIAGNOSIS — C8591 Non-Hodgkin lymphoma, unspecified, lymph nodes of head, face, and neck: Secondary | ICD-10-CM | POA: Diagnosis not present

## 2018-09-19 ENCOUNTER — Telehealth: Payer: Self-pay | Admitting: Hematology and Oncology

## 2018-09-19 NOTE — Telephone Encounter (Signed)
Scheduled per sch msg. Mailed printout

## 2018-09-29 ENCOUNTER — Ambulatory Visit: Payer: BLUE CROSS/BLUE SHIELD | Admitting: Hematology and Oncology

## 2018-09-29 ENCOUNTER — Other Ambulatory Visit: Payer: BLUE CROSS/BLUE SHIELD

## 2018-11-21 ENCOUNTER — Encounter: Payer: Self-pay | Admitting: Gastroenterology

## 2018-12-01 ENCOUNTER — Other Ambulatory Visit: Payer: Self-pay

## 2018-12-01 ENCOUNTER — Telehealth: Payer: Self-pay | Admitting: Gastroenterology

## 2018-12-01 ENCOUNTER — Telehealth: Payer: Self-pay | Admitting: *Deleted

## 2018-12-01 ENCOUNTER — Ambulatory Visit: Payer: BC Managed Care – PPO | Admitting: *Deleted

## 2018-12-01 VITALS — Ht 61.0 in | Wt 137.0 lb

## 2018-12-01 DIAGNOSIS — R1310 Dysphagia, unspecified: Secondary | ICD-10-CM

## 2018-12-01 NOTE — Telephone Encounter (Signed)

## 2018-12-01 NOTE — Progress Notes (Signed)
Called patient at 34 and was able to reach her. Previsit done via telephone, Name, DOB and Address used as identifiers No egg or soy allergy known to patient  No issues with past sedation with any surgeries  or procedures, no intubation problems  No diet pills per patient No home 02 use per patient  No blood thinners per patient  Pt denies issues with constipation  No A fib or A flutter  Patient's procedure is scheduled for Monday am at 11. Unable to mail instructions and patient does not have my chart. Reviewed instructions with patient. Patient stating understanding regarding NPO status.

## 2018-12-01 NOTE — Telephone Encounter (Signed)
Attempts x 3 for previsit scheduled for 1600 today. Patient does not answer and mailbox is full. Will  cancel Previsit, Procedure and send a No Show letter.

## 2018-12-04 ENCOUNTER — Ambulatory Visit (AMBULATORY_SURGERY_CENTER): Payer: BC Managed Care – PPO | Admitting: Gastroenterology

## 2018-12-04 ENCOUNTER — Encounter: Payer: Self-pay | Admitting: Gastroenterology

## 2018-12-04 ENCOUNTER — Other Ambulatory Visit: Payer: Self-pay

## 2018-12-04 VITALS — BP 122/78 | HR 59 | Temp 97.8°F | Resp 12 | Ht 61.0 in | Wt 138.0 lb

## 2018-12-04 DIAGNOSIS — K22719 Barrett's esophagus with dysplasia, unspecified: Secondary | ICD-10-CM

## 2018-12-04 DIAGNOSIS — K208 Other esophagitis: Secondary | ICD-10-CM | POA: Diagnosis not present

## 2018-12-04 DIAGNOSIS — K2270 Barrett's esophagus without dysplasia: Secondary | ICD-10-CM | POA: Diagnosis not present

## 2018-12-04 MED ORDER — SODIUM CHLORIDE 0.9 % IV SOLN: 500.0000 mL | Freq: Once | INTRAVENOUS | Status: DC

## 2018-12-04 NOTE — Progress Notes (Signed)
Pt's states no medical or surgical changes since previsit or office visit. 

## 2018-12-04 NOTE — Progress Notes (Signed)
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  Let her know she is not advised to drive until tomorrow and that she is advised to return to work tomorrow.  Work note given to pt and reviewed again just prior to pt leaving in wheelchair.

## 2018-12-04 NOTE — Patient Instructions (Signed)
Handout given for Barrett's Esophagus  Per Dr Loletha Carrow: Quit smoking. Avoid NSAIDS, (non-steroidal anti-inflammatory drugs) aspirin and aspirin-containing drugs, goody powders.  YOU HAD AN ENDOSCOPIC PROCEDURE TODAY AT [facility]:   Refer to the procedure report that was given to you for any specific questions about what was found during the examination.  If the procedure report does not answer your questions, please call your gastroenterologist to clarify.  If you requested that your care partner not be given the details of your procedure findings, then the procedure report has been included in a sealed envelope for you to review at your convenience later.  YOU SHOULD EXPECT: Some feelings of bloating in the abdomen. Passage of more gas than usual.  Walking can help get rid of the air that was put into your GI tract during the procedure and reduce the bloating. If you had a lower endoscopy (such as a colonoscopy or flexible sigmoidoscopy) you may notice spotting of blood in your stool or on the toilet paper. If you underwent a bowel prep for your procedure, you may not have a normal bowel movement for a few days.  Please Note:  You might notice some irritation and congestion in your nose or some drainage.  This is from the oxygen used during your procedure.  There is no need for concern and it should clear up in a day or so.  SYMPTOMS TO REPORT IMMEDIATELY:   Following upper endoscopy (EGD)  Vomiting of blood or coffee ground material  New chest pain or pain under the shoulder blades  Painful or persistently difficult swallowing  New shortness of breath  Fever of 100F or higher  Black, tarry-looking stools  For urgent or emergent issues, a gastroenterologist can be reached at any hour by calling [phone].   DIET:  We do recommend a small meal at first, but then you may proceed to your regular diet.  Drink plenty of fluids but you should avoid alcoholic beverages for 24  hours.  ACTIVITY:  You should plan to take it easy for the rest of today and you should NOT DRIVE or use heavy machinery until tomorrow (because of the sedation medicines used during the test).    FOLLOW UP: Our staff will call the number listed on your records 48-72 hours following your procedure to check on you and address any questions or concerns that you may have regarding the information given to you following your procedure. If we do not reach you, we will leave a message.  We will attempt to reach you two times.  During this call, we will ask if you have developed any symptoms of COVID 19. If you develop any symptoms (ie: fever, flu-like symptoms, shortness of breath, cough etc.) before then, please call [phone].  If you test positive for Covid 19 in the 2 weeks post procedure, please call and report this information to [place].    If any biopsies were taken you will be contacted by phone or by letter within the next 1-3 weeks.  Please call us at [phone] if you have not heard about the biopsies in 3 weeks.    SIGNATURES/CONFIDENTIALITY: You and/or your care partner have signed paperwork which will be entered into your electronic medical record.  These signatures attest to the fact that that the information above on your After Visit Summary has been reviewed and is understood.  Full responsibility of the confidentiality of this discharge information lies with you and/or your care-partner.

## 2018-12-04 NOTE — Op Note (Signed)
[name] Procedure Date: 12/04/2018 11:26 AM MRN: [id number] Endoscopist: Mallie Mussel L. Emily Santiago , MD Age: 58 Referring MD:  Date of Birth: 1960-12-12 Gender: Female Account #: [id number] Procedure:                Upper GI endoscopy Indications:              Surveillance for malignancy due to personal history                            of Barrett's esophagus (short segment, indefinite                            for dysplasia, 05/2018) Medicines:                Monitored Anesthesia Care Procedure:                Pre-Anesthesia Assessment:                           - Prior to the procedure, a History and Physical                            was performed, and patient medications and                            allergies were reviewed. The patient's tolerance of                            previous anesthesia was also reviewed. The risks                            and benefits of the procedure and the sedation                            options and risks were discussed with the patient.                            All questions were answered, and informed consent                            was obtained. Prior Anticoagulants: The patient has                            taken no previous anticoagulant or antiplatelet                            agents. ASA Grade Assessment: III - A patient with                            severe systemic disease. After reviewing the risks                            and benefits, the patient was deemed in  satisfactory condition to undergo the procedure.                           After obtaining informed consent, the endoscope was                            passed under direct vision. Throughout the                            procedure, the patient's blood pressure, pulse, and                            oxygen saturations were monitored continuously. The                            Endoscope was introduced  through the mouth, and                            advanced to the second part of duodenum. The upper                            GI endoscopy was accomplished without difficulty.                            The patient tolerated the procedure well. Scope In: Scope Out: Findings:                 One tongue of salmon-colored mucosa was present at                            39 cm. No other visible abnormalities were present.                            The maximum longitudinal extent of these esophageal                            mucosal changes was 1 cm in length. Biopsies were                            taken with a cold forceps for histology.                           The lower third of the esophagus was tortuous.                           Several non-bleeding gastric ulcers with no                            stigmata of bleeding were found in the gastric                            antrum. Similar appearance to last EGD.  The cardia and gastric fundus were normal on                            retroflexion.                           The examined duodenum was normal. Complications:            No immediate complications. Estimated Blood Loss:     Estimated blood loss was minimal. Impression:               - Salmon-colored mucosa secondary to established                            short-segment Barrett's disease. Biopsied.                           - Tortuous esophagus.                           - Non-bleeding gastric ulcers with no stigmata of                            bleeding. Aspirin-related, as patient know to be H.                            pylori negative.                           - Normal examined duodenum.                           Patient again strongly advised to stop smoking and                            using aspirin-containing medicines (i.e. BC/Goody                            powder) Recommendation:           - Patient has a contact number available  for                            emergencies. The signs and symptoms of potential                            delayed complications were discussed with the                            patient. Return to normal activities tomorrow.                            Written discharge instructions were provided to the                            patient.                           -  Resume previous diet.                           - Continue present medications.                           - Await pathology results.                           - Repeat upper endoscopy for surveillance based on                            pathology results. Oma Alpert L. Emily Carrow, MD 12/04/2018 11:54:06 AM This report has been signed electronically.

## 2018-12-04 NOTE — Progress Notes (Signed)
Called to room to assist during endoscopic procedure.  Patient ID and intended procedure confirmed with present staff. Received instructions for my participation in the procedure from the performing physician.  

## 2018-12-06 ENCOUNTER — Telehealth: Payer: Self-pay | Admitting: *Deleted

## 2018-12-06 NOTE — Telephone Encounter (Signed)
1. Have you developed a fever since your procedure? no  2.   Have you had an respiratory symptoms (SOB or cough) since your procedure? no  3.   Have you tested positive for COVID 19 since your procedure no  4.   Have you had any family members/close contacts diagnosed with the COVID 19 since your procedure?  no   If yes to any of these questions please route to Joylene John, RN and Alphonsa Gin, Therapist, sports.    Follow up Call-  Call back number 12/04/2018 05/15/2018 05/20/2017 04/23/2016  Post procedure Call Back phone  # [phone]  Permission to leave phone message Yes Yes Yes Yes  Some recent data might be hidden     Patient questions:  Do you have a fever, pain , or abdominal swelling? No. Pain Score  0 *  Have you tolerated food without any problems? Yes.    Have you been able to return to your normal activities? Yes.    Do you have any questions about your discharge instructions: Diet   No. Medications  No. Follow up visit  No.  Do you have questions or concerns about your Care? No.  Actions: * If pain score is 4 or above: No action needed, pain <4.

## 2018-12-07 ENCOUNTER — Encounter: Payer: Self-pay | Admitting: Gastroenterology

## 2018-12-13 ENCOUNTER — Telehealth: Payer: Self-pay | Admitting: Gastroenterology

## 2018-12-13 NOTE — Telephone Encounter (Signed)
Path letter reviewed with pt over the phone and questions answered.

## 2018-12-13 NOTE — Telephone Encounter (Signed)
Pt called inquiring about path results.

## 2018-12-28 ENCOUNTER — Other Ambulatory Visit: Payer: Self-pay | Admitting: Hematology and Oncology

## 2018-12-28 DIAGNOSIS — C8231 Follicular lymphoma grade IIIa, lymph nodes of head, face, and neck: Secondary | ICD-10-CM

## 2018-12-29 ENCOUNTER — Other Ambulatory Visit: Payer: Self-pay

## 2018-12-29 ENCOUNTER — Inpatient Hospital Stay: Payer: BC Managed Care – PPO | Attending: Hematology and Oncology | Admitting: Hematology and Oncology

## 2018-12-29 ENCOUNTER — Inpatient Hospital Stay: Payer: BC Managed Care – PPO

## 2018-12-29 ENCOUNTER — Encounter: Payer: Self-pay | Admitting: Hematology and Oncology

## 2018-12-29 DIAGNOSIS — Z9221 Personal history of antineoplastic chemotherapy: Secondary | ICD-10-CM

## 2018-12-29 DIAGNOSIS — C8231 Follicular lymphoma grade IIIa, lymph nodes of head, face, and neck: Secondary | ICD-10-CM

## 2018-12-29 DIAGNOSIS — Z8572 Personal history of non-Hodgkin lymphomas: Secondary | ICD-10-CM | POA: Diagnosis not present

## 2018-12-29 DIAGNOSIS — I10 Essential (primary) hypertension: Secondary | ICD-10-CM | POA: Insufficient documentation

## 2018-12-29 LAB — CBC WITH DIFFERENTIAL/PLATELET
Abs Immature Granulocytes: 0.02 10*3/uL (ref 0.00–0.07)
Basophils Absolute: 0 10*3/uL (ref 0.0–0.1)
Basophils Relative: 0 %
Eosinophils Absolute: 0.1 10*3/uL (ref 0.0–0.5)
Eosinophils Relative: 2 %
HCT: 44.4 % (ref 36.0–46.0)
Hemoglobin: 14.5 g/dL (ref 12.0–15.0)
Immature Granulocytes: 0 %
Lymphocytes Relative: 16 %
Lymphs Abs: 1.3 10*3/uL (ref 0.7–4.0)
MCH: 30.5 pg (ref 26.0–34.0)
MCHC: 32.7 g/dL (ref 30.0–36.0)
MCV: 93.5 fL (ref 80.0–100.0)
Monocytes Absolute: 0.5 10*3/uL (ref 0.1–1.0)
Monocytes Relative: 6 %
Neutro Abs: 6.2 10*3/uL (ref 1.7–7.7)
Neutrophils Relative %: 76 %
Platelets: 224 10*3/uL (ref 150–400)
RBC: 4.75 MIL/uL (ref 3.87–5.11)
RDW: 12.9 % (ref 11.5–15.5)
WBC: 8.1 10*3/uL (ref 4.0–10.5)
nRBC: 0 % (ref 0.0–0.2)

## 2018-12-29 LAB — COMPREHENSIVE METABOLIC PANEL
ALT: 8 U/L (ref 0–44)
AST: 12 U/L — ABNORMAL LOW (ref 15–41)
Albumin: 3.8 g/dL (ref 3.5–5.0)
Alkaline Phosphatase: 86 U/L (ref 38–126)
Anion gap: 10 (ref 5–15)
BUN: 14 mg/dL (ref 6–20)
CO2: 25 mmol/L (ref 22–32)
Calcium: 8.6 mg/dL — ABNORMAL LOW (ref 8.9–10.3)
Chloride: 106 mmol/L (ref 98–111)
Creatinine, Ser: 0.63 mg/dL (ref 0.44–1.00)
GFR calc Af Amer: >60 mL/min (ref >60)
GFR calc non Af Amer: >60 mL/min (ref >60)
Glucose, Bld: 74 mg/dL (ref 70–99)
Potassium: 4.3 mmol/L (ref 3.5–5.1)
Sodium: 141 mmol/L (ref 135–145)
Total Bilirubin: 0.3 mg/dL (ref 0.3–1.2)
Total Protein: 6.9 g/dL (ref 6.5–8.1)

## 2018-12-29 NOTE — Assessment & Plan Note (Signed)
She has no signs or symptoms to suggest cancer recurrence Her last CT imaging showed no evidence of disease The patient is educated to watch out for signs and symptoms of cancer recurrence such as palpable lymphadenopathy, unexplained weight loss or night sweats I emphasized the importance of annual influenza vaccination I will see her back in a year for further follow-up There is no benefit for routine surveillance imaging study

## 2018-12-29 NOTE — Progress Notes (Signed)
Emily Santiago OFFICE PROGRESS NOTE  Patient Care Team: Velna Hatchet, MD as PCP - General (Internal Medicine) Leta Baptist, MD as Consulting Physician (Otolaryngology)  ASSESSMENT & PLAN:  Follicular lymphoma grade iiia, lymph nodes of head, face, and neck ([place]) She has no signs or symptoms to suggest cancer recurrence Her last CT imaging showed no evidence of disease The patient is educated to watch out for signs and symptoms of cancer recurrence such as palpable lymphadenopathy, unexplained weight loss or night sweats I emphasized the importance of annual influenza vaccination I will see her back in a year for further follow-up There is no benefit for routine surveillance imaging study  Essential hypertension Her blood pressure is elevated It could be due to anxiety She will continue close follow-up with her primary care doctor for medical management She will continue current prescription antihypertensives   No orders of the defined types were placed in this encounter.   INTERVAL HISTORY: Please see below for problem oriented charting. She returns for further follow-up No new lymphadenopathy She had recent EGD evaluation Her stomach reflux sensation is improved with proton pump inhibitor She denies recent infection, fever or chills  SUMMARY OF ONCOLOGIC HISTORY: Oncology History Overview Note  FLIPI score 0 (low risk)   Follicular lymphoma grade iiia, lymph nodes of head, face, and neck ([place])  03/08/2016 Imaging   Ct neck showed right level IIa lymphadenopathy at site of palpable interest. Additionally, there are prominent right upper cervical and left supraclavicular lymph nodes. This may represent nodal metastasis or be related to a systemic inflammatory or lymphoproliferative process. No infectious/and process in the neck identified. Effacement of the right vallecula an asymmetric thickening of the right aryepiglottic fold, direct visualization is recommended to  evaluate for mucosal lesion.   03/19/2016 Pathology Results   Accession: [id number] Histologic type: Follicular lymphoma, large cell type. Grade (if applicable): High grade, 3 A. Flow cytometry: B cell population with kappa light chain excess ([id number]) Immunohistochemical stains: CD20, CD3, CD10, BCL-2, BCL-6 and Ki-67 with appropriate controls. Touch preps/imprints: A mixture of small and large lymphoid cells. Comments: The sections show effacement of the lymph nodal architecture by numerous variably sized and ill defined atypical lymphoid follicles characterized by lack of polarity, attenuated or absent mantle zones and relatively homogeneous composition of small and large transformed and cleaved lymphoid cells. The number of large lymphoid cells varies in different follicles but generally average more than 15 cells / high power field . This is associated with scattered mitosis. No diffuse component is identified. To further evaluate this process, flow cytometric analysis was performed and shows a B cell population displaying expression of pan B cell antigens including CD20 associated with CD10 and kappa light chain excess. Immunohistochemical stains were performed and show that the lymphoid follicles are positive for CD20, CD10, BCL-6 and BCL-2. Ki 67 shows variable increased expression ranging for 10% to >50% in many of the follicles. The atypical lymphoid follicles are surrounded by an abundant population of T cells as primarily seen with CD3. 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 (BNS:kh 03-23-16)   03/19/2016 Procedure   Direct laryngoscopy was negative for abnormalities. Biopsies are taken from right neck LN   04/12/2016 PET scan   PET scan showed abnormal right station 2A lymph node measuring 1.1 cm in length with maximum SUV of 6.1, Deauville scale 4. There is some symmetric and probably physiologic activity along the lingual tonsils. Spleen normal, and no other  adenopathy  identified   05/11/2016 - 06/22/2016 Chemotherapy   She received R-CHOP chemo x 3   07/13/2016 PET scan   Interval resolution of hypermetabolic right cervical level 2A lymph node. No evidence of metabolically active lymphoma.   03/29/2018 Imaging   1. No findings to suggest residual or recurrent lymphoma in the chest, abdomen, or pelvis. 2. Gastric antral wall thickening, at least partially felt to be due to underdistention. Correlate with symptoms of gastritis. Consider endoscopy depending on clinical symptoms. Of note, the appearance is grossly similar to 2014. 3. Possible constipation. 4. Aortic atherosclerosis (ICD10-I70.0) and emphysema (ICD10-J43.9).     REVIEW OF SYSTEMS:   Constitutional: Denies fevers, chills or abnormal weight loss Eyes: Denies blurriness of vision Ears, nose, mouth, throat, and face: Denies mucositis or sore throat Respiratory: Denies cough, dyspnea or wheezes Cardiovascular: Denies palpitation, chest discomfort or lower extremity swelling Gastrointestinal:  Denies nausea, heartburn or change in bowel habits Skin: Denies abnormal skin rashes Lymphatics: Denies new lymphadenopathy or easy bruising Neurological:Denies numbness, tingling or new weaknesses Behavioral/Psych: Mood is stable, no new changes  All other systems were reviewed with the patient and are negative.  I have reviewed the past medical history, past surgical history, social history and family history with the patient and they are unchanged from previous note.  ALLERGIES:  is allergic to lisinopril; penicillins; and prednisone.  MEDICATIONS:  Current Outpatient Medications  Medication Sig Dispense Refill  . amLODipine (NORVASC) 10 MG tablet Take 1 tablet (10 mg total) daily by mouth. 30 tablet 9  . B Complex Vitamins (B-COMPLEX/B-12) TABS Take 1 tablet by mouth daily.    Marland Kitchen BIOTIN 5000 PO Take 1 capsule by mouth daily.    . Cholecalciferol (VITAMIN D3) 5000 units CAPS Take  5,000 Units by mouth daily.    Marland Kitchen escitalopram (LEXAPRO) 10 MG tablet TAKE 1 TABLET BY MOUTH EVERY DAY AS NEEDED FOR ANXIETY  6  . hydrOXYzine (ATARAX/VISTARIL) 25 MG tablet TAKE 1 TO 2 TABS 3 TIMES DAILY AS NEEDED FOR ANXIETY/INSOMNIA  3  . Multiple Vitamin (MULTIVITAMIN WITH MINERALS) TABS tablet Take 1 tablet by mouth daily.    Marland Kitchen omeprazole (PRILOSEC) 40 MG capsule Take 40 mg by mouth 2 (two) times a day.    . polyethylene glycol (MIRALAX / GLYCOLAX) packet Take 17 g by mouth every other day.     . zolpidem (AMBIEN) 10 MG tablet TAKE 1/2 TO 1 TABLET BY MOUTH AT BEDTIME FOR INSOMNIA  4   Current Facility-Administered Medications  Medication Dose Route Frequency Provider Last Rate Last Dose  . 0.9 %  sodium chloride infusion  500 mL Intravenous Once [name], MD        PHYSICAL EXAMINATION: ECOG PERFORMANCE STATUS: 0 - Asymptomatic  Vitals:   12/29/18 0956  BP: (!) 151/78  Pulse: 72  Resp: 18  Temp: 98.5 F (36.9 C)  SpO2: 98%   Filed Weights   12/29/18 0956  Weight: 141 lb (64 kg)    GENERAL:alert, no distress and comfortable SKIN: skin color, texture, turgor are normal, no rashes or significant lesions EYES: normal, Conjunctiva are pink and non-injected, sclera clear OROPHARYNX:no exudate, no erythema and lips, buccal mucosa, and tongue normal  NECK: supple, thyroid normal size, non-tender, without nodularity LYMPH:  no palpable lymphadenopathy in the cervical, axillary or inguinal LUNGS: clear to auscultation and percussion with normal breathing effort HEART: regular rate & rhythm and no murmurs and no lower extremity edema ABDOMEN:abdomen soft, non-tender and normal  bowel sounds Musculoskeletal:no cyanosis of digits and no clubbing  NEURO: alert & oriented x 3 with fluent speech, no focal motor/sensory deficits  LABORATORY DATA:  I have reviewed the data as listed    Component Value Date/Time   NA 141 12/29/2018 0941   NA 137 04/22/2017 1407   K 4.3  12/29/2018 0941   K 5.0 04/22/2017 1407   CL 106 12/29/2018 0941   CO2 25 12/29/2018 0941   CO2 24 04/22/2017 1407   GLUCOSE 74 12/29/2018 0941   GLUCOSE 77 04/22/2017 1407   BUN 14 12/29/2018 0941   BUN 21.6 04/22/2017 1407   CREATININE 0.63 12/29/2018 0941   CREATININE 0.8 04/22/2017 1407   CALCIUM 8.6 (L) 12/29/2018 0941   CALCIUM 9.5 04/22/2017 1407   PROT 6.9 12/29/2018 0941   PROT 7.1 04/22/2017 1407   ALBUMIN 3.8 12/29/2018 0941   ALBUMIN 3.9 04/22/2017 1407   AST 12 (L) 12/29/2018 0941   AST 18 04/22/2017 1407   ALT 8 12/29/2018 0941   ALT 16 04/22/2017 1407   ALKPHOS 86 12/29/2018 0941   ALKPHOS 91 04/22/2017 1407   BILITOT 0.3 12/29/2018 0941   BILITOT 0.55 04/22/2017 1407   GFRNONAA >60 12/29/2018 0941   GFRAA >60 12/29/2018 0941    No results found for: SPEP, UPEP  Lab Results  Component Value Date   WBC 8.1 12/29/2018   NEUTROABS 6.2 12/29/2018   HGB 14.5 12/29/2018   HCT 44.4 12/29/2018   MCV 93.5 12/29/2018   PLT 224 12/29/2018      Chemistry      Component Value Date/Time   NA 141 12/29/2018 0941   NA 137 04/22/2017 1407   K 4.3 12/29/2018 0941   K 5.0 04/22/2017 1407   CL 106 12/29/2018 0941   CO2 25 12/29/2018 0941   CO2 24 04/22/2017 1407   BUN 14 12/29/2018 0941   BUN 21.6 04/22/2017 1407   CREATININE 0.63 12/29/2018 0941   CREATININE 0.8 04/22/2017 1407      Component Value Date/Time   CALCIUM 8.6 (L) 12/29/2018 0941   CALCIUM 9.5 04/22/2017 1407   ALKPHOS 86 12/29/2018 0941   ALKPHOS 91 04/22/2017 1407   AST 12 (L) 12/29/2018 0941   AST 18 04/22/2017 1407   ALT 8 12/29/2018 0941   ALT 16 04/22/2017 1407   BILITOT 0.3 12/29/2018 0941   BILITOT 0.55 04/22/2017 1407      All questions were answered. The patient knows to call the clinic with any problems, questions or concerns. No barriers to learning was detected.  I spent 10 minutes counseling the patient face to face. The total time spent in the appointment was 15 minutes  and more than 50% was on counseling and review of test results  Heath Lark, MD 12/29/2018 12:14 PM

## 2018-12-29 NOTE — Assessment & Plan Note (Signed)
Her blood pressure is elevated It could be due to anxiety She will continue close follow-up with her primary care doctor for medical management She will continue current prescription antihypertensives

## 2019-01-01 ENCOUNTER — Telehealth: Payer: Self-pay | Admitting: Hematology and Oncology

## 2019-01-01 NOTE — Telephone Encounter (Signed)
I left a message regarding schedule  

## 2019-02-07 DIAGNOSIS — R062 Wheezing: Secondary | ICD-10-CM | POA: Diagnosis not present

## 2019-02-07 DIAGNOSIS — Z20828 Contact with and (suspected) exposure to other viral communicable diseases: Secondary | ICD-10-CM | POA: Diagnosis not present

## 2019-03-26 DIAGNOSIS — E7849 Other hyperlipidemia: Secondary | ICD-10-CM | POA: Diagnosis not present

## 2019-03-30 DIAGNOSIS — F419 Anxiety disorder, unspecified: Secondary | ICD-10-CM | POA: Diagnosis not present

## 2019-03-30 DIAGNOSIS — F331 Major depressive disorder, recurrent, moderate: Secondary | ICD-10-CM | POA: Diagnosis not present

## 2019-03-30 DIAGNOSIS — C8591 Non-Hodgkin lymphoma, unspecified, lymph nodes of head, face, and neck: Secondary | ICD-10-CM | POA: Diagnosis not present

## 2019-03-30 DIAGNOSIS — I10 Essential (primary) hypertension: Secondary | ICD-10-CM | POA: Diagnosis not present

## 2019-04-03 DIAGNOSIS — I129 Hypertensive chronic kidney disease with stage 1 through stage 4 chronic kidney disease, or unspecified chronic kidney disease: Secondary | ICD-10-CM | POA: Diagnosis not present

## 2019-04-03 DIAGNOSIS — R05 Cough: Secondary | ICD-10-CM | POA: Diagnosis not present

## 2019-04-03 DIAGNOSIS — N182 Chronic kidney disease, stage 2 (mild): Secondary | ICD-10-CM | POA: Diagnosis not present

## 2019-04-03 DIAGNOSIS — B349 Viral infection, unspecified: Secondary | ICD-10-CM | POA: Diagnosis not present

## 2019-04-06 ENCOUNTER — Other Ambulatory Visit: Payer: Self-pay

## 2019-04-06 DIAGNOSIS — Z20828 Contact with and (suspected) exposure to other viral communicable diseases: Secondary | ICD-10-CM | POA: Diagnosis not present

## 2019-04-06 DIAGNOSIS — Z20822 Contact with and (suspected) exposure to covid-19: Secondary | ICD-10-CM

## 2019-04-07 LAB — NOVEL CORONAVIRUS, NAA: SARS-CoV-2, NAA: NOT DETECTED

## 2019-04-11 ENCOUNTER — Telehealth: Payer: Self-pay | Admitting: General Practice

## 2019-04-11 NOTE — Telephone Encounter (Signed)
Negative COVID results given. Patient results "NOT Detected." Caller expressed understanding. ° °

## 2019-04-27 DIAGNOSIS — N390 Urinary tract infection, site not specified: Secondary | ICD-10-CM | POA: Diagnosis not present

## 2019-05-04 DIAGNOSIS — N182 Chronic kidney disease, stage 2 (mild): Secondary | ICD-10-CM | POA: Diagnosis not present

## 2019-05-04 DIAGNOSIS — F419 Anxiety disorder, unspecified: Secondary | ICD-10-CM | POA: Diagnosis not present

## 2019-05-04 DIAGNOSIS — E785 Hyperlipidemia, unspecified: Secondary | ICD-10-CM | POA: Diagnosis not present

## 2019-05-04 DIAGNOSIS — I129 Hypertensive chronic kidney disease with stage 1 through stage 4 chronic kidney disease, or unspecified chronic kidney disease: Secondary | ICD-10-CM | POA: Diagnosis not present

## 2019-07-19 DIAGNOSIS — M25561 Pain in right knee: Secondary | ICD-10-CM | POA: Diagnosis not present

## 2019-07-19 DIAGNOSIS — M545 Low back pain: Secondary | ICD-10-CM | POA: Diagnosis not present

## 2019-09-27 DIAGNOSIS — M859 Disorder of bone density and structure, unspecified: Secondary | ICD-10-CM | POA: Diagnosis not present

## 2019-09-27 DIAGNOSIS — Z0000 Encounter for general adult medical examination without abnormal findings: Secondary | ICD-10-CM | POA: Diagnosis not present

## 2019-09-27 DIAGNOSIS — E7849 Other hyperlipidemia: Secondary | ICD-10-CM | POA: Diagnosis not present

## 2019-10-01 DIAGNOSIS — R82998 Other abnormal findings in urine: Secondary | ICD-10-CM | POA: Diagnosis not present

## 2019-10-01 DIAGNOSIS — I10 Essential (primary) hypertension: Secondary | ICD-10-CM | POA: Diagnosis not present

## 2019-10-03 DIAGNOSIS — Z1212 Encounter for screening for malignant neoplasm of rectum: Secondary | ICD-10-CM | POA: Diagnosis not present

## 2019-10-03 LAB — IFOBT (OCCULT BLOOD): IFOBT: NEGATIVE

## 2019-10-08 DIAGNOSIS — M25562 Pain in left knee: Secondary | ICD-10-CM | POA: Diagnosis not present

## 2019-10-08 DIAGNOSIS — Z1331 Encounter for screening for depression: Secondary | ICD-10-CM | POA: Diagnosis not present

## 2019-10-08 DIAGNOSIS — Z0000 Encounter for general adult medical examination without abnormal findings: Secondary | ICD-10-CM | POA: Diagnosis not present

## 2019-10-08 DIAGNOSIS — F419 Anxiety disorder, unspecified: Secondary | ICD-10-CM | POA: Diagnosis not present

## 2019-10-08 DIAGNOSIS — K2970 Gastritis, unspecified, without bleeding: Secondary | ICD-10-CM | POA: Diagnosis not present

## 2019-10-08 DIAGNOSIS — F331 Major depressive disorder, recurrent, moderate: Secondary | ICD-10-CM | POA: Diagnosis not present

## 2019-10-15 DIAGNOSIS — M1711 Unilateral primary osteoarthritis, right knee: Secondary | ICD-10-CM | POA: Diagnosis not present

## 2019-10-15 DIAGNOSIS — M25561 Pain in right knee: Secondary | ICD-10-CM | POA: Diagnosis not present

## 2019-10-16 ENCOUNTER — Encounter (HOSPITAL_COMMUNITY): Payer: Self-pay | Admitting: Emergency Medicine

## 2019-10-16 ENCOUNTER — Other Ambulatory Visit: Payer: Self-pay

## 2019-10-16 ENCOUNTER — Emergency Department (HOSPITAL_COMMUNITY): Payer: BC Managed Care – PPO

## 2019-10-16 ENCOUNTER — Emergency Department (HOSPITAL_COMMUNITY)
Admission: EM | Admit: 2019-10-16 | Discharge: 2019-10-16 | Disposition: A | Discharge status: Home / Self Care | Payer: BC Managed Care – PPO | Attending: Emergency Medicine | Admitting: Emergency Medicine

## 2019-10-16 DIAGNOSIS — E876 Hypokalemia: Secondary | ICD-10-CM | POA: Insufficient documentation

## 2019-10-16 DIAGNOSIS — Z79899 Other long term (current) drug therapy: Secondary | ICD-10-CM | POA: Diagnosis not present

## 2019-10-16 DIAGNOSIS — G44209 Tension-type headache, unspecified, not intractable: Secondary | ICD-10-CM | POA: Diagnosis not present

## 2019-10-16 DIAGNOSIS — F17210 Nicotine dependence, cigarettes, uncomplicated: Secondary | ICD-10-CM | POA: Diagnosis not present

## 2019-10-16 DIAGNOSIS — E871 Hypo-osmolality and hyponatremia: Secondary | ICD-10-CM | POA: Diagnosis not present

## 2019-10-16 DIAGNOSIS — R519 Headache, unspecified: Secondary | ICD-10-CM | POA: Diagnosis not present

## 2019-10-16 DIAGNOSIS — R0789 Other chest pain: Secondary | ICD-10-CM | POA: Diagnosis not present

## 2019-10-16 DIAGNOSIS — I129 Hypertensive chronic kidney disease with stage 1 through stage 4 chronic kidney disease, or unspecified chronic kidney disease: Secondary | ICD-10-CM | POA: Diagnosis not present

## 2019-10-16 DIAGNOSIS — N182 Chronic kidney disease, stage 2 (mild): Secondary | ICD-10-CM | POA: Insufficient documentation

## 2019-10-16 DIAGNOSIS — R079 Chest pain, unspecified: Secondary | ICD-10-CM | POA: Diagnosis not present

## 2019-10-16 DIAGNOSIS — J449 Chronic obstructive pulmonary disease, unspecified: Secondary | ICD-10-CM | POA: Diagnosis not present

## 2019-10-16 LAB — BASIC METABOLIC PANEL
Anion gap: 12 (ref 5–15)
BUN: 10 mg/dL (ref 6–20)
CO2: 25 mmol/L (ref 22–32)
Calcium: 9.1 mg/dL (ref 8.9–10.3)
Chloride: 91 mmol/L — ABNORMAL LOW (ref 98–111)
Creatinine, Ser: 0.39 mg/dL — ABNORMAL LOW (ref 0.44–1.00)
GFR calc Af Amer: >60 mL/min (ref >60)
GFR calc non Af Amer: >60 mL/min (ref >60)
Glucose, Bld: 90 mg/dL (ref 70–99)
Potassium: 3.1 mmol/L — ABNORMAL LOW (ref 3.5–5.1)
Sodium: 128 mmol/L — ABNORMAL LOW (ref 135–145)

## 2019-10-16 LAB — CBC
HCT: 43.9 % (ref 36.0–46.0)
Hemoglobin: 15.1 g/dL — ABNORMAL HIGH (ref 12.0–15.0)
MCH: 31.4 pg (ref 26.0–34.0)
MCHC: 34.4 g/dL (ref 30.0–36.0)
MCV: 91.3 fL (ref 80.0–100.0)
Platelets: 257 10*3/uL (ref 150–400)
RBC: 4.81 MIL/uL (ref 3.87–5.11)
RDW: 12.3 % (ref 11.5–15.5)
WBC: 7.4 10*3/uL (ref 4.0–10.5)
nRBC: 0 % (ref 0.0–0.2)

## 2019-10-16 LAB — TROPONIN I (HIGH SENSITIVITY)
Troponin I (High Sensitivity): 3 ng/L (ref <18)
Troponin I (High Sensitivity): 4 ng/L (ref <18)

## 2019-10-16 LAB — MAGNESIUM: Magnesium: 1.8 mg/dL (ref 1.7–2.4)

## 2019-10-16 LAB — I-STAT BETA HCG BLOOD, ED (MC, WL, AP ONLY): I-stat hCG, quantitative: <5 m[IU]/mL (ref <5)

## 2019-10-16 MED ORDER — DIPHENHYDRAMINE HCL 50 MG/ML IJ SOLN
25.0000 mg | Freq: Once | INTRAMUSCULAR | Status: AC
  Administered 2019-10-16: 20:00:00 25 mg via INTRAVENOUS
  Filled 2019-10-16: qty 1

## 2019-10-16 MED ORDER — POTASSIUM CHLORIDE ER 10 MEQ PO TBCR
30.0000 meq | EXTENDED_RELEASE_TABLET | Freq: Every day | ORAL | 0 refills | Status: DC
Start: 2019-10-16 — End: 2020-02-25

## 2019-10-16 MED ORDER — METOCLOPRAMIDE HCL 5 MG/ML IJ SOLN
10.0000 mg | Freq: Once | INTRAMUSCULAR | Status: AC
  Administered 2019-10-16: 10 mg via INTRAVENOUS
  Filled 2019-10-16: qty 2

## 2019-10-16 MED ORDER — SODIUM CHLORIDE 0.9 % IV BOLUS
1000.0000 mL | Freq: Once | INTRAVENOUS | Status: AC
  Administered 2019-10-16: 1000 mL via INTRAVENOUS

## 2019-10-16 MED ORDER — POTASSIUM CHLORIDE CRYS ER 20 MEQ PO TBCR
40.0000 meq | EXTENDED_RELEASE_TABLET | Freq: Once | ORAL | Status: AC
  Administered 2019-10-16: 21:00:00 40 meq via ORAL
  Filled 2019-10-16: qty 2

## 2019-10-16 MED ORDER — SODIUM CHLORIDE 0.9% FLUSH: 3.0000 mL | Freq: Once | INTRAVENOUS | Status: DC

## 2019-10-16 NOTE — ED Notes (Signed)
Pt in X ray

## 2019-10-16 NOTE — ED Provider Notes (Signed)
[facility] DEPT Provider Note   CSN: [id number] Arrival date & time: 10/16/19  1446     History Chief Complaint  Patient presents with  . Headache  . Chest Pain  . Shortness of Breath  . Medication Reaction    Emily Santiago is a 59 y.o. female with a past medical history of COPD, GERD, hypertension, arthritis presenting to the ED with a chief complaint of chest pain and headache.  Yesterday was prescribed meloxicam by her PCP for chronic right knee pain.  She took one dose of this and felt like she was having a "bad reaction to it."  States that she began experiencing a headache and intermittent chest pain and palpitations.  Also was having shortness of breath.  She denies taking meloxicam in the past or having this type of reaction.  She denies any rash, signs of angioedema or anaphylaxis.  She continues to have intermittent chest pain and headache.  She has not tried any other medications with her symptoms.  She denies any cough different than baseline, leg swelling, vision changes, vomiting, numbness in arms or legs, weakness, or neck stiffness.  HPI     Past Medical History:  Diagnosis Date  . Anxiety   . Arthritis    WRIST AND BACK  . Atrophic vaginitis   . CKD (chronic kidney disease), stage II   . COPD (chronic obstructive pulmonary disease) ([place])   . Depression   . GERD (gastroesophageal reflux disease)   . History of head and neck cancer   . Hx of colonic polyps   . Hyperlipidemia   . Hypertension   . Insomnia   . Lymphoma ([place])    Lymphoma  . Osteopenia   . Osteoporosis   . Plantar fasciitis   . Tobacco abuse   . Vallecular mass   . Varicose veins   . Vitamin D deficiency     Patient Active Problem List   Diagnosis Date Noted  . Weight loss 03/23/2018  . Family discord 04/22/2017  . Insomnia 04/22/2017  . Epigastric pain 02/19/2017  . Chest pain 02/18/2017  . Hypotension due to drugs 02/18/2017  . Hypokalemia 02/18/2017   . Chronic fatigue 01/21/2017  . DJD (degenerative joint disease) of cervical spine 10/15/2016  . Dysuria 07/05/2016  . Other constipation 06/22/2016  . Mucositis due to antineoplastic therapy 06/08/2016  . Hypotension 06/03/2016  . Sore throat 05/04/2016  . Dysphagia 04/22/2016  . Gastroesophageal reflux disease 04/22/2016  . 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 Essential hypertension 04/21/2016  . Tobacco abuse 04/20/2016  . Pain of left lower extremity-Thigh / Leg 02/05/2014  . Varicose veins of lower extremities with other complications 40/98/1191    Past Surgical History:  Procedure Laterality Date  . ABDOMINAL HYSTERECTOMY    . APPENDECTOMY    . CHOLECYSTECTOMY    . ESOPHAGEAL MANOMETRY N/A 04/28/2016   Procedure: ESOPHAGEAL MANOMETRY (EM);  Surgeon: Mauri Pole, MD;  Location: [facility] END[facility]COPY;  Service: Endoscopy;  Laterality: N/A;  dr. Loletha Carrow  . LYMPH NODE BIOPSY    . UPPER GASTROINTESTINAL ENDOSCOPY    . WRIST SURGERY Right      OB History   No obstetric history on file.     Family History  Problem Relation Age of Onset  . Heart disease Father   . Colon cancer Father        dx in his 63's  . Diabetes Father  metastatic  . Colon polyps Father   . Heart disease Maternal Grandmother   . Breast cancer Maternal Grandmother   . Prostate cancer Maternal Uncle   . Brain cancer Brother   . Liver cancer Brother   . Bone cancer Brother   . Prostate cancer Brother   . Hyperlipidemia Mother   . Colon polyps Mother   . Breast cancer Cousin        x3  . Stomach cancer Neg Hx   . Pancreatic cancer Neg Hx   . Esophageal cancer Neg Hx   . Rectal cancer Neg Hx     Social History   Tobacco Use  . Smoking status: Current Every Day Smoker    Packs/day: 1.00    Years: 35.00    Pack years: 35.00    Types: Cigarettes  . Smokeless tobacco: Never Used  . Tobacco comment: form given 04-22-16  Substance Use  Topics  . Alcohol use: No  . Drug use: No    Home Medications Prior to Admission medications   Medication Sig Start Date End Date Taking? Authorizing Provider  acetaminophen (TYLENOL) 500 MG tablet Take 500 mg by mouth every 6 (six) hours as needed for moderate pain.   Yes [provider]  amLODipine (NORVASC) 10 MG tablet Take 1 tablet (10 mg total) daily by mouth. 04/22/17  Yes Gorsuch, Ni, MD  B Complex Vitamins (B-COMPLEX/B-12) TABS Take 1 tablet by mouth daily.   Yes [provider]  BIOTIN 5000 PO Take 1 capsule by mouth daily.   Yes [provider]  chlorthalidone (HYGROTON) 25 MG tablet Take 25 mg by mouth daily. 10/10/19  Yes [provider]  Cholecalciferol (VITAMIN D3) 5000 units CAPS Take 10,000 Units by mouth daily.    Yes [provider]  escitalopram (LEXAPRO) 10 MG tablet Take 10 mg by mouth daily as needed (anxiety).  05/03/18  Yes [provider]  famotidine (PEPCID) 40 MG tablet Take 40 mg by mouth 2 (two) times daily.  10/08/19  Yes [provider]  FLUoxetine (PROZAC) 20 MG capsule Take 20 mg by mouth daily. 10/08/19  Yes [provider]  hydrOXYzine (ATARAX/VISTARIL) 25 MG tablet Take 25-50 mg by mouth every 8 (eight) hours as needed for anxiety.  03/15/18  Yes [provider]  ibuprofen (ADVIL) 200 MG tablet Take 200 mg by mouth every 6 (six) hours as needed for moderate pain.   Yes [provider]  Multiple Vitamin (MULTIVITAMIN WITH MINERALS) TABS tablet Take 1 tablet by mouth daily.   Yes [provider]  omeprazole (PRILOSEC) 40 MG capsule Take 40 mg by mouth 2 (two) times a day.   Yes [provider]  polyethylene glycol (MIRALAX / GLYCOLAX) packet Take 17 g by mouth daily as needed for moderate constipation.    Yes [provider]  zolpidem (AMBIEN) 10 MG tablet Take 5 mg by mouth at bedtime.  03/16/18  Yes [provider]  potassium chloride  (KLOR-CON) 10 MEQ tablet Take 3 tablets (30 mEq total) by mouth daily for 4 days. 10/16/19 10/20/19  Aasiya Creasey, Nicanor Alcon, PA-C    Allergies    Lisinopril, Penicillins, and Prednisone  Review of Systems   Review of Systems  Constitutional: Negative for appetite change, chills and fever.  HENT: Negative for ear pain, rhinorrhea, sneezing and sore throat.   Eyes: Negative for photophobia and visual disturbance.  Respiratory: Positive for shortness of breath. Negative for cough, chest tightness and wheezing.  Cardiovascular: Positive for chest pain. Negative for palpitations.  Gastrointestinal: Negative for abdominal pain, blood in stool, constipation, diarrhea, nausea and vomiting.  Genitourinary: Negative for dysuria, hematuria and urgency.  Musculoskeletal: Negative for myalgias.  Skin: Negative for rash.  Neurological: Positive for headaches. Negative for dizziness, weakness and light-headedness.    Physical Exam Updated Vital Signs BP (!) 144/67   Pulse 66   Temp 98.7 F (37.1 C) (Oral)   Resp 18   SpO2 98%   Physical Exam Vitals and nursing note reviewed.  Constitutional:      General: She is not in acute distress.    Appearance: She is well-developed.     Comments: Speaking in complete sentences without difficulty.  No signs of respiratory distress, angioedema or anaphylaxis.  HENT:     Head: Normocephalic and atraumatic.     Nose: Nose normal.  Eyes:     General: No scleral icterus.       Left eye: No discharge.     Conjunctiva/sclera: Conjunctivae normal.  Cardiovascular:     Rate and Rhythm: Normal rate and regular rhythm.     Heart sounds: Normal heart sounds. No murmur. No friction rub. No gallop.   Pulmonary:     Effort: Pulmonary effort is normal. No respiratory distress.     Breath sounds: Normal breath sounds.  Abdominal:     General: Bowel sounds are normal. There is no distension.     Palpations: Abdomen is soft.     Tenderness: There is no abdominal tenderness.  There is no guarding.  Musculoskeletal:        General: Normal range of motion.     Cervical back: Normal range of motion and neck supple.     Right lower leg: No edema.     Left lower leg: No edema.  Skin:    General: Skin is warm and dry.     Findings: No rash.  Neurological:     General: No focal deficit present.     Mental Status: She is alert and oriented to person, place, and time. Mental status is at baseline.     Cranial Nerves: No cranial nerve deficit.     Sensory: No sensory deficit.     Motor: No weakness or abnormal muscle tone.     Coordination: Coordination normal.     Comments: Pupils reactive. No facial asymmetry noted. Cranial nerves appear grossly intact. Sensation intact to light touch on face, BUE and BLE. Strength 5/5 in BUE and BLE. No ataxia.     ED Results / Procedures / Treatments   Labs (all labs ordered are listed, but only abnormal results are displayed) Labs Reviewed  CBC - Abnormal; Notable for the following components:      Result Value   Hemoglobin 15.1 (*)    All other components within normal limits  BASIC METABOLIC PANEL - Abnormal; Notable for the following components:   Sodium 128 (*)    Potassium 3.1 (*)    Chloride 91 (*)    Creatinine, Ser 0.39 (*)    All other components within normal limits  MAGNESIUM  I-STAT BETA HCG BLOOD, ED ([facility], [facility], [facility] ONLY)  TROPONIN I (HIGH SENSITIVITY)  TROPONIN I (HIGH SENSITIVITY)    EKG EKG Interpretation  Date/Time:  Tuesday Oct 16 2019 14:55:46 EDT Ventricular Rate:  74 PR Interval:    QRS Duration: 101 QT Interval:  397 QTC Calculation: 441 R Axis:   85 Text Interpretation: Sinus rhythm Borderline right  axis deviation Probable anteroseptal infarct, old Confirmed by Virgel Manifold [phone]) on 10/16/2019 6:34:31 PM   Radiology DG Chest 2 View  Result Date: 10/16/2019 CLINICAL DATA:  Chest pain EXAM: CHEST - 2 VIEW COMPARISON:  02/18/2017 FINDINGS: The heart size and mediastinal contours are  within normal limits. Both lungs are clear. Disc degenerative disease of the thoracic spine. IMPRESSION: No acute abnormality of the lungs. Electronically Signed   By: Eddie Candle M.D.   On: 10/16/2019 15:38   CT Head Wo Contrast  Result Date: 10/16/2019 CLINICAL DATA:  Headache, chest pain and shortness of breath. EXAM: CT HEAD WITHOUT CONTRAST TECHNIQUE: Contiguous axial images were obtained from the base of the skull through the vertex without intravenous contrast. COMPARISON:  Head CT 02/18/2017 FINDINGS: Brain: Stable mild age advanced cerebral atrophy. Stable asymmetry in the size of the lateral ventricles. This is likely a normal variant. No acute intracranial findings such as hemispheric infarction or intracranial hemorrhage. No extra-axial fluid collections are identified. The gray-white differentiation is maintained. The brainstem and cerebellum are grossly normal in stable. Vascular: Scattered vascular calcifications but no aneurysm or hyperdense vessels. Skull: No skull fracture or bone lesions. Sinuses/Orbits: The paranasal sinuses and mastoid air cells are clear. The globes are intact. Other: No scalp lesions or hematoma. IMPRESSION: No acute intracranial findings or mass lesions. Electronically Signed   By: Marijo Sanes M.D.   On: 10/16/2019 18:40    Procedures Procedures (including critical care time)  Medications Ordered in ED Medications  sodium chloride flush (NS) 0.9 % injection 3 mL (3 mLs Intravenous Not Given 10/16/19 2014)  metoCLOPramide (REGLAN) injection 10 mg (10 mg Intravenous Given 10/16/19 2016)  diphenhydrAMINE (BENADRYL) injection 25 mg (25 mg Intravenous Given 10/16/19 2016)  sodium chloride 0.9 % bolus 1,000 mL (0 mLs Intravenous Stopped 10/16/19 2132)  potassium chloride SA (KLOR-CON) CR tablet 40 mEq (40 mEq Oral Given 10/16/19 2115)    ED Course  I have reviewed the triage vital signs and the nursing notes.  Pertinent labs & imaging results that were available  during my care of the patient were reviewed by me and considered in my medical decision making (see chart for details).    MDM Rules/Calculators/A&P                      42-year-old female with past medical history of COPD, GERD, hypertension, arthritis presenting to the ED with a chief complaint of chest pain and headache.  Yesterday she was prescribed meloxicam by her PCP for chronic right knee pain.  She took 1 dose and felt like she was having a "bad reaction to it."  Reports headache intermittent chest pain palpitations.  Denies any rash, angioedema or anaphylaxis.  She continues to have intermittent chest pain and headache today.  On exam she is overall well-appearing.  She does appear somewhat anxious but no neurological deficits noted on my exam.  She is not confused, she is alert and oriented x3.  No ataxia noted.  She denies any vision changes.  No lower extremity edema, erythema or calf tenderness of concern me for DVT.  Work-up here shows EKG showing sinus rhythm, no acute ischemic changes.  Both troponins are negative here.  BMP significant for sodium of 128, hypokalemia of 3.1 which is repleted orally.  She was given IV fluids.  Magnesium level is normal.  CT of the head is negative for acute abnormality.  Chest x-ray is unremarkable.  Patient was given  migraine cocktail along with IV fluids with resolution of her symptoms.  She she is able to tolerate p.o. intake without difficulty. There are no headache characteristics that are lateralizing or concerning for increased ICP, infectious or vascular cause of her symptoms.  She is requesting discharge and does not feel that she needs further workup or treatment. She did have an admission several months ago for chest pain rule out with reassuring stress test so I doubt ACS as the cause of her chest pain. Thankfully, there are no signs of a severe allergic reaction such as angioedema or anaphylaxis.  I told her to stop taking the Mobic.  Have her  follow-up with PCP and return for worsening symptoms.  All imaging, if done today, including plain films, CT scans, and ultrasounds, independently reviewed by me, and interpretations confirmed via formal radiology reads.  Patient is hemodynamically stable, in NAD, and able to ambulate in the ED. Evaluation does not show pathology that would require ongoing emergent intervention or inpatient treatment. I explained the diagnosis to the patient. Pain has been managed and has no complaints prior to discharge. Patient is comfortable with above plan and is stable for discharge at this time. All questions were answered prior to disposition. Strict return precautions for returning to the ED were discussed. Encouraged follow up with PCP.   An After Visit Summary was printed and given to the patient.   Portions of this note were generated with [employer]. Dictation errors may occur despite best attempts at proofreading.  Final Clinical Impression(s) / ED Diagnoses Final diagnoses:  Chest wall pain  Tension headache  Hypokalemia  Hyponatremia    Rx / DC Orders ED Discharge Orders         Ordered    potassium chloride (KLOR-CON) 10 MEQ tablet  Daily     10/16/19 2134           Delia Heady, PA-C 10/16/19 2137    Virgel Manifold, MD 10/16/19 2143

## 2019-10-16 NOTE — Discharge Instructions (Addendum)
Continue your home medications as previously prescribed. Stop taking the meloxicam. Follow-up with your primary care provider.  They will need to recheck your potassium level in 1 week. Return to the ER if you start to experience worsening chest pain, headache, blurry vision, numbness in arms or legs.

## 2019-10-16 NOTE — ED Notes (Signed)
Called phlebotomy for assistance

## 2019-10-16 NOTE — ED Notes (Signed)
Pt verbalizes understanding of DC instructions. Pt belongings returned and is ambulatory out of ED.  

## 2019-10-16 NOTE — ED Triage Notes (Signed)
Pt reports started taking Mobic yesterday for her pains. reports an hour after she took it started having headache, chest pains and SOB.

## 2019-10-27 DIAGNOSIS — M25561 Pain in right knee: Secondary | ICD-10-CM | POA: Diagnosis not present

## 2019-10-31 DIAGNOSIS — S83281A Other tear of lateral meniscus, current injury, right knee, initial encounter: Secondary | ICD-10-CM | POA: Diagnosis not present

## 2019-10-31 DIAGNOSIS — M25561 Pain in right knee: Secondary | ICD-10-CM | POA: Diagnosis not present

## 2019-10-31 DIAGNOSIS — M1711 Unilateral primary osteoarthritis, right knee: Secondary | ICD-10-CM | POA: Diagnosis not present

## 2019-10-31 DIAGNOSIS — M2341 Loose body in knee, right knee: Secondary | ICD-10-CM | POA: Diagnosis not present

## 2019-11-11 DIAGNOSIS — Z20822 Contact with and (suspected) exposure to covid-19: Secondary | ICD-10-CM | POA: Diagnosis not present

## 2019-11-11 DIAGNOSIS — J209 Acute bronchitis, unspecified: Secondary | ICD-10-CM | POA: Diagnosis not present

## 2019-11-11 DIAGNOSIS — R112 Nausea with vomiting, unspecified: Secondary | ICD-10-CM | POA: Diagnosis not present

## 2019-11-13 DIAGNOSIS — F17200 Nicotine dependence, unspecified, uncomplicated: Secondary | ICD-10-CM | POA: Diagnosis not present

## 2019-11-13 DIAGNOSIS — J209 Acute bronchitis, unspecified: Secondary | ICD-10-CM | POA: Diagnosis not present

## 2019-11-13 DIAGNOSIS — K2970 Gastritis, unspecified, without bleeding: Secondary | ICD-10-CM | POA: Diagnosis not present

## 2019-11-13 DIAGNOSIS — J040 Acute laryngitis: Secondary | ICD-10-CM | POA: Diagnosis not present

## 2019-11-16 DIAGNOSIS — J209 Acute bronchitis, unspecified: Secondary | ICD-10-CM | POA: Diagnosis not present

## 2019-11-16 DIAGNOSIS — F17200 Nicotine dependence, unspecified, uncomplicated: Secondary | ICD-10-CM | POA: Diagnosis not present

## 2019-11-16 DIAGNOSIS — J441 Chronic obstructive pulmonary disease with (acute) exacerbation: Secondary | ICD-10-CM | POA: Diagnosis not present

## 2019-11-16 DIAGNOSIS — Z1152 Encounter for screening for COVID-19: Secondary | ICD-10-CM | POA: Diagnosis not present

## 2019-11-16 DIAGNOSIS — K219 Gastro-esophageal reflux disease without esophagitis: Secondary | ICD-10-CM | POA: Diagnosis not present

## 2019-11-19 DIAGNOSIS — M1711 Unilateral primary osteoarthritis, right knee: Secondary | ICD-10-CM | POA: Diagnosis not present

## 2019-11-20 DIAGNOSIS — M659 Synovitis and tenosynovitis, unspecified: Secondary | ICD-10-CM | POA: Diagnosis not present

## 2019-11-20 DIAGNOSIS — M25461 Effusion, right knee: Secondary | ICD-10-CM | POA: Diagnosis not present

## 2019-11-20 DIAGNOSIS — M1711 Unilateral primary osteoarthritis, right knee: Secondary | ICD-10-CM | POA: Diagnosis not present

## 2019-11-20 DIAGNOSIS — M25561 Pain in right knee: Secondary | ICD-10-CM | POA: Diagnosis not present

## 2019-11-22 ENCOUNTER — Ambulatory Visit: Payer: BC Managed Care – PPO | Admitting: Gastroenterology

## 2019-11-29 ENCOUNTER — Ambulatory Visit: Payer: BC Managed Care – PPO | Admitting: Gastroenterology

## 2019-11-29 ENCOUNTER — Encounter: Payer: Self-pay | Admitting: Gastroenterology

## 2019-12-10 DIAGNOSIS — M2341 Loose body in knee, right knee: Secondary | ICD-10-CM | POA: Diagnosis not present

## 2019-12-10 DIAGNOSIS — S83281A Other tear of lateral meniscus, current injury, right knee, initial encounter: Secondary | ICD-10-CM | POA: Diagnosis not present

## 2019-12-14 DIAGNOSIS — I10 Essential (primary) hypertension: Secondary | ICD-10-CM | POA: Diagnosis not present

## 2019-12-14 DIAGNOSIS — M25562 Pain in left knee: Secondary | ICD-10-CM | POA: Diagnosis not present

## 2019-12-18 DIAGNOSIS — E871 Hypo-osmolality and hyponatremia: Secondary | ICD-10-CM | POA: Diagnosis not present

## 2019-12-18 DIAGNOSIS — F331 Major depressive disorder, recurrent, moderate: Secondary | ICD-10-CM | POA: Diagnosis not present

## 2019-12-18 DIAGNOSIS — M25562 Pain in left knee: Secondary | ICD-10-CM | POA: Diagnosis not present

## 2019-12-18 DIAGNOSIS — R05 Cough: Secondary | ICD-10-CM | POA: Diagnosis not present

## 2019-12-18 DIAGNOSIS — I10 Essential (primary) hypertension: Secondary | ICD-10-CM | POA: Diagnosis not present

## 2019-12-20 DIAGNOSIS — S83241A Other tear of medial meniscus, current injury, right knee, initial encounter: Secondary | ICD-10-CM | POA: Diagnosis not present

## 2019-12-20 DIAGNOSIS — M94261 Chondromalacia, right knee: Secondary | ICD-10-CM | POA: Diagnosis not present

## 2019-12-20 DIAGNOSIS — M25361 Other instability, right knee: Secondary | ICD-10-CM | POA: Diagnosis not present

## 2019-12-20 DIAGNOSIS — S83001A Unspecified subluxation of right patella, initial encounter: Secondary | ICD-10-CM | POA: Diagnosis not present

## 2019-12-20 DIAGNOSIS — S83281A Other tear of lateral meniscus, current injury, right knee, initial encounter: Secondary | ICD-10-CM | POA: Diagnosis not present

## 2019-12-20 DIAGNOSIS — M2341 Loose body in knee, right knee: Secondary | ICD-10-CM | POA: Diagnosis not present

## 2019-12-20 DIAGNOSIS — G8918 Other acute postprocedural pain: Secondary | ICD-10-CM | POA: Diagnosis not present

## 2019-12-20 DIAGNOSIS — X503XXA Overexertion from repetitive movements, initial encounter: Secondary | ICD-10-CM | POA: Diagnosis not present

## 2019-12-20 DIAGNOSIS — X58XXXA Exposure to other specified factors, initial encounter: Secondary | ICD-10-CM | POA: Diagnosis not present

## 2019-12-27 DIAGNOSIS — M2341 Loose body in knee, right knee: Secondary | ICD-10-CM | POA: Diagnosis not present

## 2019-12-28 ENCOUNTER — Other Ambulatory Visit: Payer: Self-pay | Admitting: Hematology and Oncology

## 2019-12-28 DIAGNOSIS — C8231 Follicular lymphoma grade IIIa, lymph nodes of head, face, and neck: Secondary | ICD-10-CM

## 2019-12-31 ENCOUNTER — Inpatient Hospital Stay: Payer: BC Managed Care – PPO

## 2019-12-31 ENCOUNTER — Other Ambulatory Visit: Payer: Self-pay

## 2019-12-31 ENCOUNTER — Inpatient Hospital Stay: Payer: BC Managed Care – PPO | Attending: Hematology and Oncology | Admitting: Hematology and Oncology

## 2019-12-31 ENCOUNTER — Encounter: Payer: Self-pay | Admitting: Hematology and Oncology

## 2019-12-31 DIAGNOSIS — R6889 Other general symptoms and signs: Secondary | ICD-10-CM

## 2019-12-31 DIAGNOSIS — F329 Major depressive disorder, single episode, unspecified: Secondary | ICD-10-CM | POA: Insufficient documentation

## 2019-12-31 DIAGNOSIS — Z791 Long term (current) use of non-steroidal anti-inflammatories (NSAID): Secondary | ICD-10-CM | POA: Diagnosis not present

## 2019-12-31 DIAGNOSIS — Z79899 Other long term (current) drug therapy: Secondary | ICD-10-CM | POA: Diagnosis not present

## 2019-12-31 DIAGNOSIS — Z8572 Personal history of non-Hodgkin lymphomas: Secondary | ICD-10-CM | POA: Insufficient documentation

## 2019-12-31 DIAGNOSIS — C8231 Follicular lymphoma grade IIIa, lymph nodes of head, face, and neck: Secondary | ICD-10-CM

## 2019-12-31 DIAGNOSIS — K5909 Other constipation: Secondary | ICD-10-CM | POA: Insufficient documentation

## 2019-12-31 DIAGNOSIS — Z87891 Personal history of nicotine dependence: Secondary | ICD-10-CM | POA: Diagnosis not present

## 2019-12-31 DIAGNOSIS — G4700 Insomnia, unspecified: Secondary | ICD-10-CM | POA: Diagnosis not present

## 2019-12-31 DIAGNOSIS — I10 Essential (primary) hypertension: Secondary | ICD-10-CM | POA: Diagnosis not present

## 2019-12-31 DIAGNOSIS — Z9221 Personal history of antineoplastic chemotherapy: Secondary | ICD-10-CM | POA: Insufficient documentation

## 2019-12-31 DIAGNOSIS — Z720 Tobacco use: Secondary | ICD-10-CM | POA: Diagnosis not present

## 2019-12-31 LAB — COMPREHENSIVE METABOLIC PANEL
ALT: 13 U/L (ref 0–44)
AST: 14 U/L — ABNORMAL LOW (ref 15–41)
Albumin: 3.7 g/dL (ref 3.5–5.0)
Alkaline Phosphatase: 81 U/L (ref 38–126)
Anion gap: 9 (ref 5–15)
BUN: 11 mg/dL (ref 6–20)
CO2: 29 mmol/L (ref 22–32)
Calcium: 9.3 mg/dL (ref 8.9–10.3)
Chloride: 94 mmol/L — ABNORMAL LOW (ref 98–111)
Creatinine, Ser: 0.68 mg/dL (ref 0.44–1.00)
GFR calc Af Amer: >60 mL/min (ref >60)
GFR calc non Af Amer: >60 mL/min (ref >60)
Glucose, Bld: 57 mg/dL — ABNORMAL LOW (ref 70–99)
Potassium: 3.8 mmol/L (ref 3.5–5.1)
Sodium: 132 mmol/L — ABNORMAL LOW (ref 135–145)
Total Bilirubin: 0.5 mg/dL (ref 0.3–1.2)
Total Protein: 6.7 g/dL (ref 6.5–8.1)

## 2019-12-31 LAB — CBC WITH DIFFERENTIAL/PLATELET
Abs Immature Granulocytes: 0.05 10*3/uL (ref 0.00–0.07)
Basophils Absolute: 0 10*3/uL (ref 0.0–0.1)
Basophils Relative: 0 %
Eosinophils Absolute: 0.3 10*3/uL (ref 0.0–0.5)
Eosinophils Relative: 3 %
HCT: 39.5 % (ref 36.0–46.0)
Hemoglobin: 13.4 g/dL (ref 12.0–15.0)
Immature Granulocytes: 1 %
Lymphocytes Relative: 20 %
Lymphs Abs: 1.7 10*3/uL (ref 0.7–4.0)
MCH: 31.5 pg (ref 26.0–34.0)
MCHC: 33.9 g/dL (ref 30.0–36.0)
MCV: 92.7 fL (ref 80.0–100.0)
Monocytes Absolute: 0.7 10*3/uL (ref 0.1–1.0)
Monocytes Relative: 9 %
Neutro Abs: 5.6 10*3/uL (ref 1.7–7.7)
Neutrophils Relative %: 67 %
Platelets: 293 10*3/uL (ref 150–400)
RBC: 4.26 MIL/uL (ref 3.87–5.11)
RDW: 13.4 % (ref 11.5–15.5)
WBC: 8.4 10*3/uL (ref 4.0–10.5)
nRBC: 0 % (ref 0.0–0.2)

## 2019-12-31 NOTE — Assessment & Plan Note (Signed)
We have extensive discussions about the importance of smoking cessation

## 2019-12-31 NOTE — Assessment & Plan Note (Signed)
She has no signs or symptoms to suggest cancer recurrence Her last CT imaging showed no evidence of disease The patient is educated to watch out for signs and symptoms of cancer recurrence such as palpable lymphadenopathy, unexplained weight loss or night sweats I emphasized the importance of annual influenza vaccination I will see her back in a year for further follow-up There is no benefit for routine surveillance imaging study

## 2019-12-31 NOTE — Assessment & Plan Note (Signed)
She has multiple complaints about insomnia, depression, changes in her blood work, right knee pain, etc. None of her symptoms were related to her prior history of lymphoma She is not on any active treatment for her lymphoma She is on extensive amount of medications and according to her mother, the patient does not take care of herself well She does not eat healthy, no exercise and had recurrent bronchitis due to smoking We discussed the importance of taking good care of herself starting with healthy diet, exercise once her knee problem has resolved and to quit smoking permanently I also recommend close follow-up with primary care doctor due to recent electrolyte changes and her borderline low blood pressure

## 2019-12-31 NOTE — Progress Notes (Signed)
[facility] OFFICE PROGRESS NOTE  Patient Care Team: Velna Hatchet, MD as PCP - General (Internal Medicine) Leta Baptist, MD as Consulting Physician (Otolaryngology)  ASSESSMENT & PLAN:  Follicular lymphoma grade iiia, lymph nodes of head, face, and neck ([place]) She has no signs or symptoms to suggest cancer recurrence Her last CT imaging showed no evidence of disease The patient is educated to watch out for signs and symptoms of cancer recurrence such as palpable lymphadenopathy, unexplained weight loss or night sweats I emphasized the importance of annual influenza vaccination I will see her back in a year for further follow-up There is no benefit for routine surveillance imaging study  Tobacco abuse We have extensive discussions about the importance of smoking cessation  Essential hypertension The patient is on multiple different blood pressure medications Her blood pressure is low The patient had recent electrolyte imbalances that has resolved I recommend close follow-up with her primary care doctor for medication adjustment  Multiple complaints She has multiple complaints about insomnia, depression, changes in her blood work, right knee pain, etc. None of her symptoms were related to her prior history of lymphoma She is not on any active treatment for her lymphoma She is on extensive amount of medications and according to her mother, the patient does not take care of herself well She does not eat healthy, no exercise and had recurrent bronchitis due to smoking We discussed the importance of taking good care of herself starting with healthy diet, exercise once her knee problem has resolved and to quit smoking permanently I also recommend close follow-up with primary care doctor due to recent electrolyte changes and her borderline low blood pressure   No orders of the defined types were placed in this encounter.   All questions were answered. The patient knows to  call the clinic with any problems, questions or concerns. The total time spent in the appointment was 20 minutes encounter with patients including review of chart and various tests results, discussions about plan of care and coordination of care plan   Heath Lark, MD 12/31/2019 12:04 PM  INTERVAL HISTORY: Please see below for problem oriented charting. She returns with her mother today for her follow-up The patient has not been feeling well She have recurrent bouts of bronchitis this year.  Every time she got sick, she quit smoking but as soon as she got better, she smoked again She eat junk food all day She brought with her an extensive list of medications that she takes for anxiety, insomnia, depression, blood pressure and others She was in the emergency department recently due to electrolyte imbalances She had significant knee pain and wants to go for knee surgery but unable to proceed due to changes to her electrolytes There were no recent lymphadenopathy Her mother has come to live with her to take care of the patient She has intermittent chronic constipation  SUMMARY OF ONCOLOGIC HISTORY: Oncology History Overview Note  FLIPI score 0 (low risk)   Follicular lymphoma grade iiia, lymph nodes of head, face, and neck ([place])  03/08/2016 Imaging   Ct neck showed right level IIa lymphadenopathy at site of palpable interest. Additionally, there are prominent right upper cervical and left supraclavicular lymph nodes. This may represent nodal metastasis or be related to a systemic inflammatory or lymphoproliferative process. No infectious/and process in the neck identified. Effacement of the right vallecula an asymmetric thickening of the right aryepiglottic fold, direct visualization is recommended to evaluate for mucosal lesion.  03/19/2016 Pathology Results   Accession: [id number] Histologic type: Follicular lymphoma, large cell type. Grade (if applicable): High grade, 3 A. Flow  cytometry: B cell population with kappa light chain excess ([id number]) Immunohistochemical stains: CD20, CD3, CD10, BCL-2, BCL-6 and Ki-67 with appropriate controls. Touch preps/imprints: A mixture of small and large lymphoid cells. Comments: The sections show effacement of the lymph nodal architecture by numerous variably sized and ill defined atypical lymphoid follicles characterized by lack of polarity, attenuated or absent mantle zones and relatively homogeneous composition of small and large transformed and cleaved lymphoid cells. The number of large lymphoid cells varies in different follicles but generally average more than 15 cells / high power field . This is associated with scattered mitosis. No diffuse component is identified. To further evaluate this process, flow cytometric analysis was performed and shows a B cell population displaying expression of pan B cell antigens including CD20 associated with CD10 and kappa light chain excess. Immunohistochemical stains were performed and show that the lymphoid follicles are positive for CD20, CD10, BCL-6 and BCL-2. Ki 67 shows variable increased expression ranging for 10% to >50% in many of the follicles. The atypical lymphoid follicles are surrounded by an abundant population of T cells as primarily seen with CD3. 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 (BNS:kh 03-23-16)   03/19/2016 Procedure   Direct laryngoscopy was negative for abnormalities. Biopsies are taken from right neck LN   04/12/2016 PET scan   PET scan showed abnormal right station 2A lymph node measuring 1.1 cm in length with maximum SUV of 6.1, Deauville scale 4. There is some symmetric and probably physiologic activity along the lingual tonsils. Spleen normal, and no other adenopathy identified   05/11/2016 - 06/22/2016 Chemotherapy   She received R-CHOP chemo x 3   07/13/2016 PET scan   Interval resolution of hypermetabolic right cervical level 2A lymph  node. No evidence of metabolically active lymphoma.   03/29/2018 Imaging   1. No findings to suggest residual or recurrent lymphoma in the chest, abdomen, or pelvis. 2. Gastric antral wall thickening, at least partially felt to be due to underdistention. Correlate with symptoms of gastritis. Consider endoscopy depending on clinical symptoms. Of note, the appearance is grossly similar to 2014. 3. Possible constipation. 4. Aortic atherosclerosis (ICD10-I70.0) and emphysema (ICD10-J43.9).     REVIEW OF SYSTEMS:   Constitutional: Denies fevers, chills or abnormal weight loss Eyes: Denies blurriness of vision Ears, nose, mouth, throat, and face: Denies mucositis or sore throat Respiratory: Denies cough, dyspnea or wheezes Cardiovascular: Denies palpitation, chest discomfort or lower extremity swelling Skin: Denies abnormal skin rashes Lymphatics: Denies new lymphadenopathy or easy bruising Neurological:Denies numbness, tingling or new weaknesses Behavioral/Psych: Mood is stable, no new changes  All other systems were reviewed with the patient and are negative.  I have reviewed the past medical history, past surgical history, social history and family history with the patient and they are unchanged from previous note.  ALLERGIES:  is allergic to lisinopril, penicillins, and prednisone.  MEDICATIONS:  Current Outpatient Medications  Medication Sig Dispense Refill  . rosuvastatin (CRESTOR) 5 MG tablet Take 5 mg by mouth daily.    Marland Kitchen telmisartan (MICARDIS) 40 MG tablet Take 40 mg by mouth daily.    Marland Kitchen acetaminophen (TYLENOL) 500 MG tablet Take 500 mg by mouth every 6 (six) hours as needed for moderate pain.    Marland Kitchen amLODipine (NORVASC) 10 MG tablet Take 1 tablet (10 mg total) daily by mouth. 30 tablet  9  . B Complex Vitamins (B-COMPLEX/B-12) TABS Take 1 tablet by mouth daily.    Marland Kitchen BIOTIN 5000 PO Take 1 capsule by mouth daily.    . chlorthalidone (HYGROTON) 25 MG tablet Take 25 mg by mouth  daily.    . Cholecalciferol (VITAMIN D3) 5000 units CAPS Take 10,000 Units by mouth daily.     Marland Kitchen escitalopram (LEXAPRO) 10 MG tablet Take 10 mg by mouth daily as needed (anxiety).   6  . famotidine (PEPCID) 40 MG tablet Take 40 mg by mouth 2 (two) times daily.     Marland Kitchen FLUoxetine (PROZAC) 20 MG capsule Take 20 mg by mouth daily.    . hydrOXYzine (ATARAX/VISTARIL) 25 MG tablet Take 25-50 mg by mouth every 8 (eight) hours as needed for anxiety.   3  . ibuprofen (ADVIL) 200 MG tablet Take 200 mg by mouth every 6 (six) hours as needed for moderate pain.    . Multiple Vitamin (MULTIVITAMIN WITH MINERALS) TABS tablet Take 1 tablet by mouth daily.    Marland Kitchen omeprazole (PRILOSEC) 40 MG capsule Take 40 mg by mouth 2 (two) times a day.    . polyethylene glycol (MIRALAX / GLYCOLAX) packet Take 17 g by mouth daily as needed for moderate constipation.     . potassium chloride (KLOR-CON) 10 MEQ tablet Take 3 tablets (30 mEq total) by mouth daily for 4 days. 12 tablet 0  . zolpidem (AMBIEN) 10 MG tablet Take 5 mg by mouth at bedtime.   4   Current Facility-Administered Medications  Medication Dose Route Frequency Provider Last Rate Last Admin  . 0.9 %  sodium chloride infusion  500 mL Intravenous Once Doran Stabler, MD        PHYSICAL EXAMINATION: ECOG PERFORMANCE STATUS: 2 - Symptomatic, <50% confined to bed  Vitals:   12/31/19 1017  BP: (!) 98/58  Pulse: 82  Resp: 18  Temp: 98.4 F (36.9 C)   Filed Weights   12/31/19 1017  Weight: 135 lb (61.2 kg)    GENERAL:alert, no distress and comfortable SKIN: skin color, texture, turgor are normal, no rashes or significant lesions EYES: normal, Conjunctiva are pink and non-injected, sclera clear OROPHARYNX:no exudate, no erythema and lips, buccal mucosa, and tongue normal  NECK: supple, thyroid normal size, non-tender, without nodularity LYMPH:  no palpable lymphadenopathy in the cervical, axillary or inguinal LUNGS: clear to auscultation and percussion  with normal breathing effort HEART: regular rate & rhythm and no murmurs and no lower extremity edema ABDOMEN:abdomen soft, non-tender and normal bowel sounds Musculoskeletal:no cyanosis of digits and no clubbing  NEURO: alert & oriented x 3 with fluent speech, no focal motor/sensory deficits  LABORATORY DATA:  I have reviewed the data as listed    Component Value Date/Time   NA 132 (L) 12/31/2019 0938   NA 137 04/22/2017 1407   K 3.8 12/31/2019 0938   K 5.0 04/22/2017 1407   CL 94 (L) 12/31/2019 0938   CO2 29 12/31/2019 0938   CO2 24 04/22/2017 1407   GLUCOSE 57 (L) 12/31/2019 0938   GLUCOSE 77 04/22/2017 1407   BUN 11 12/31/2019 0938   BUN 21.6 04/22/2017 1407   CREATININE 0.68 12/31/2019 0938   CREATININE 0.8 04/22/2017 1407   CALCIUM 9.3 12/31/2019 0938   CALCIUM 9.5 04/22/2017 1407   PROT 6.7 12/31/2019 0938   PROT 7.1 04/22/2017 1407   ALBUMIN 3.7 12/31/2019 0938   ALBUMIN 3.9 04/22/2017 1407   AST 14 (L) 12/31/2019 2778  AST 18 04/22/2017 1407   ALT 13 12/31/2019 0938   ALT 16 04/22/2017 1407   ALKPHOS 81 12/31/2019 0938   ALKPHOS 91 04/22/2017 1407   BILITOT 0.5 12/31/2019 0938   BILITOT 0.55 04/22/2017 1407   GFRNONAA >60 12/31/2019 0938   GFRAA >60 12/31/2019 0938    No results found for: SPEP, UPEP  Lab Results  Component Value Date   WBC 8.4 12/31/2019   NEUTROABS 5.6 12/31/2019   HGB 13.4 12/31/2019   HCT 39.5 12/31/2019   MCV 92.7 12/31/2019   PLT 293 12/31/2019      Chemistry      Component Value Date/Time   NA 132 (L) 12/31/2019 0938   NA 137 04/22/2017 1407   K 3.8 12/31/2019 0938   K 5.0 04/22/2017 1407   CL 94 (L) 12/31/2019 0938   CO2 29 12/31/2019 0938   CO2 24 04/22/2017 1407   BUN 11 12/31/2019 0938   BUN 21.6 04/22/2017 1407   CREATININE 0.68 12/31/2019 0938   CREATININE 0.8 04/22/2017 1407      Component Value Date/Time   CALCIUM 9.3 12/31/2019 0938   CALCIUM 9.5 04/22/2017 1407   ALKPHOS 81 12/31/2019 0938   ALKPHOS 91  04/22/2017 1407   AST 14 (L) 12/31/2019 0938   AST 18 04/22/2017 1407   ALT 13 12/31/2019 0938   ALT 16 04/22/2017 1407   BILITOT 0.5 12/31/2019 0938   BILITOT 0.55 04/22/2017 1407

## 2019-12-31 NOTE — Assessment & Plan Note (Signed)
The patient is on multiple different blood pressure medications Her blood pressure is low The patient had recent electrolyte imbalances that has resolved I recommend close follow-up with her primary care doctor for medication adjustment

## 2020-01-01 ENCOUNTER — Telehealth: Payer: Self-pay | Admitting: Hematology and Oncology

## 2020-01-01 NOTE — Telephone Encounter (Signed)
Scheduled per 7/19 sch msg. Called and left a msg, mailing appt letter and calendar printout

## 2020-01-02 ENCOUNTER — Institutional Professional Consult (permissible substitution): Payer: BC Managed Care – PPO | Admitting: Internal Medicine

## 2020-01-08 DIAGNOSIS — M2341 Loose body in knee, right knee: Secondary | ICD-10-CM | POA: Diagnosis not present

## 2020-01-08 DIAGNOSIS — M25461 Effusion, right knee: Secondary | ICD-10-CM | POA: Diagnosis not present

## 2020-01-08 DIAGNOSIS — M25561 Pain in right knee: Secondary | ICD-10-CM | POA: Diagnosis not present

## 2020-01-09 DIAGNOSIS — M23203 Derangement of unspecified medial meniscus due to old tear or injury, right knee: Secondary | ICD-10-CM | POA: Diagnosis not present

## 2020-01-09 DIAGNOSIS — M23261 Derangement of other lateral meniscus due to old tear or injury, right knee: Secondary | ICD-10-CM | POA: Diagnosis not present

## 2020-01-09 DIAGNOSIS — M25561 Pain in right knee: Secondary | ICD-10-CM | POA: Diagnosis not present

## 2020-01-10 DIAGNOSIS — M25561 Pain in right knee: Secondary | ICD-10-CM | POA: Diagnosis not present

## 2020-01-14 DIAGNOSIS — Z20822 Contact with and (suspected) exposure to covid-19: Secondary | ICD-10-CM | POA: Diagnosis not present

## 2020-01-17 DIAGNOSIS — M23261 Derangement of other lateral meniscus due to old tear or injury, right knee: Secondary | ICD-10-CM | POA: Diagnosis not present

## 2020-01-17 DIAGNOSIS — M23203 Derangement of unspecified medial meniscus due to old tear or injury, right knee: Secondary | ICD-10-CM | POA: Diagnosis not present

## 2020-01-17 DIAGNOSIS — M25561 Pain in right knee: Secondary | ICD-10-CM | POA: Diagnosis not present

## 2020-01-21 ENCOUNTER — Ambulatory Visit (INDEPENDENT_AMBULATORY_CARE_PROVIDER_SITE_OTHER): Payer: BC Managed Care – PPO | Admitting: Gastroenterology

## 2020-01-21 ENCOUNTER — Encounter: Payer: Self-pay | Admitting: Gastroenterology

## 2020-01-21 VITALS — BP 150/84 | HR 92 | Ht 60.0 in | Wt 138.2 lb

## 2020-01-21 DIAGNOSIS — R1314 Dysphagia, pharyngoesophageal phase: Secondary | ICD-10-CM

## 2020-01-21 DIAGNOSIS — R05 Cough: Secondary | ICD-10-CM | POA: Diagnosis not present

## 2020-01-21 DIAGNOSIS — R1013 Epigastric pain: Secondary | ICD-10-CM

## 2020-01-21 DIAGNOSIS — R053 Chronic cough: Secondary | ICD-10-CM

## 2020-01-21 NOTE — Progress Notes (Signed)
[facility] Progress Note  Chief Complaint: Dysphagia and cough  Subjective  History: Emily Santiago was last seen for an upper endoscopy for Barrett's surveillance in June 2020.  Endoscopy findings were similar to her December 2019 exam, with a single short tongue of salmon-colored mucosa, tortuous distal esophagus and several clean-based gastric ulcers.  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  Biopsy on that exam showed no Barrett's. Primary care saw her a few months back and referred her back to [place] for ongoing symptoms.  She was a no-show for 2 visits in June, so I sent her a letter offering 1 last chance for reschedule.  Emily Santiago wanted to see me for 2 to 3 months of chronic dry cough and a feeling of getting "strangled" with some solid food and liquids. She is seen primary care on at least a couple of occasions, and is now on some antibiotics for this cough. She says they wanted her to see a lung doctor, she was contacted by the clinic but said she was not able to have a visit at that time because she was dealing with multiple other issues. She had a right knee surgery and then infection and subsequent surgery. This is led to some difficulty with transportation, which is why she could not get to the visit with me.  She does not feel regurgitation or pyrosis. She still has some chronic epigastric discomfort. She cannot seem to get any relief from the cough, and unfortunately continues to smoke with recent attempts to cut down.  ROS: Cardiovascular:  no chest pain Respiratory: No dyspnea except during coughing episodes. Chronic arthralgias and knee pain Depression and anxiety stable Remainder of systems negative except as above The patient's Past Medical, Family and Social History were reviewed and are on file in the EMR.  Objective:  Med list reviewed  Current Outpatient Medications:  .   acetaminophen (TYLENOL) 500 MG tablet, Take 500 mg by mouth every 6 (six) hours as needed for moderate pain., Disp: , Rfl:  .  amLODipine (NORVASC) 10 MG tablet, Take 1 tablet (10 mg total) daily by mouth., Disp: 30 tablet, Rfl: 9 .  B Complex Vitamins (B-COMPLEX/B-12) TABS, Take 1 tablet by mouth daily., Disp: , Rfl:  .  benzonatate (TESSALON) 100 MG capsule, Take 200 mg by mouth every 8 (eight) hours as needed., Disp: , Rfl:  .  BIOTIN 5000 PO, Take 1 capsule by mouth daily., Disp: , Rfl:  .  cephALEXin (KEFLEX) 500 MG capsule, Take 500 mg by mouth 4 (four) times daily., Disp: , Rfl:  .  chlorthalidone (HYGROTON) 25 MG tablet, Take 25 mg by mouth daily., Disp: , Rfl:  .  Cholecalciferol (VITAMIN D3) 5000 units CAPS, Take 10,000 Units by mouth daily. , Disp: , Rfl:  .  diclofenac (VOLTAREN) 50 MG EC tablet, Take 50 mg by mouth 2 (two) times daily as needed., Disp: , Rfl:  .  escitalopram (LEXAPRO) 10 MG tablet, Take 10 mg by mouth daily as needed (anxiety). , Disp: , Rfl: 6 .  famotidine (PEPCID) 40 MG tablet, Take 40 mg by mouth 2 (two) times daily. , Disp: , Rfl:  .  FLUoxetine (PROZAC) 20 MG capsule, Take 20 mg by mouth daily., Disp: , Rfl:  .  HYDROcodone-acetaminophen (NORCO/VICODIN) 5-325 MG tablet, Take 1 tablet by mouth every 4 (four) hours as needed., Disp: , Rfl:  .  hydrOXYzine (ATARAX/VISTARIL) 25 MG tablet, Take 25-50 mg by mouth every 8 (eight) hours as needed for anxiety. , Disp: , Rfl: 3 .  ibuprofen (ADVIL) 200 MG tablet, Take 200 mg by mouth every 6 (six) hours as needed for moderate pain., Disp: , Rfl:  .  LORazepam (ATIVAN) 0.5 MG tablet, Take 0.5 mg by mouth 2 (two) times daily as needed., Disp: , Rfl:  .  Multiple Vitamin (MULTIVITAMIN WITH MINERALS) TABS tablet, Take 1 tablet by mouth daily., Disp: , Rfl:  .  omeprazole (PRILOSEC) 40 MG capsule, Take 40 mg by mouth 2 (two) times a day., Disp: , Rfl:  .  polyethylene glycol (MIRALAX / GLYCOLAX) packet, Take 17 g by mouth  daily as needed for moderate constipation. , Disp: , Rfl:  .  potassium chloride (KLOR-CON) 10 MEQ tablet, Take 3 tablets (30 mEq total) by mouth daily for 4 days., Disp: 12 tablet, Rfl: 0 .  rosuvastatin (CRESTOR) 5 MG tablet, Take 5 mg by mouth daily., Disp: , Rfl:  .  telmisartan (MICARDIS) 40 MG tablet, Take 40 mg by mouth daily., Disp: , Rfl:  .  zolpidem (AMBIEN) 10 MG tablet, Take 5 mg by mouth at bedtime. , Disp: , Rfl: 4  Current Facility-Administered Medications:  .  0.9 %  sodium chloride infusion, 500 mL, Intravenous, Once, Danis, Estill Cotta III, MD   Vital signs in last 24 hrs: Vitals:   01/21/20 1518  BP: (!) 150/84  Pulse: 92    Physical Exam  Raspy vocal quality as before.  HEENT: sclera anicteric, oral mucosa moist without lesions  Neck: supple, no thyromegaly, JVD or lymphadenopathy  Cardiac: RRR without murmurs, S1S2 heard, no peripheral edema  Pulm: clear to auscultation bilaterally, normal RR and effort noted  Abdomen: soft, mild epigastric tenderness, with active bowel sounds. No guarding or palpable hepatosplenomegaly.  Skin; warm and dry, no jaundice or rash  Labs:   ___________________________________________ Radiologic studies:   ____________________________________________ Other:   _____________________________________________ Assessment & Plan  Assessment: Encounter Diagnoses  Name Primary?  . Pharyngoesophageal dysphagia Yes  . Chronic cough   . Epigastric pain    Chronic epigastric discomfort, aspirin induced gastric ulcers. Now with several months of pharyngeal esophageal dysphagia in the setting of chronic cough. Her dysphagia is probably some UES and pharyngeal dysfunction from this upper airway cough syndrome. I do not think she is likely to have a structural lesion such as a stricture or mass in the esophagus.  I strongly urged her to follow through with the pulmonary evaluation, and also recommended an ENT evaluation for  consideration of fiberoptic exam in a patient with upper respiratory symptoms and chronic smoking history. Plan:  Referral placed to [facility] Referral placed to Dr. Melida Quitter at [facility]. Strongly advised to do everything she can to quit smoking.  30 minutes were spent on this encounter (including chart review, history/exam, counseling/coordination of care, and documentation)  [name]

## 2020-01-21 NOTE — Patient Instructions (Signed)
If you are age 60 or older, your body mass index should be between 23-30. Your Body mass index is 27 kg/m. If this is out of the aforementioned range listed, please consider follow up with your Primary Care Provider.  If you are age 18 or younger, your body mass index should be between 19-25. Your Body mass index is 27 kg/m. If this is out of the aformentioned range listed, please consider follow up with your Primary Care Provider.   We will send your records to [facility] and Dr Melida Quitter at [facility]. They will both reach out to you to schedule.   It was a pleasure to see you today!  Dr. Loletha Carrow

## 2020-01-23 ENCOUNTER — Telehealth: Payer: Self-pay

## 2020-01-23 NOTE — Telephone Encounter (Signed)
Records have been faxed to Dr Redmond Baseman. Will await appointment information.  Internal referral sent to Pulmonary at [facility] and appointment scheduled 02-20-2020.

## 2020-01-24 DIAGNOSIS — M25561 Pain in right knee: Secondary | ICD-10-CM | POA: Diagnosis not present

## 2020-01-24 DIAGNOSIS — M23261 Derangement of other lateral meniscus due to old tear or injury, right knee: Secondary | ICD-10-CM | POA: Diagnosis not present

## 2020-01-24 DIAGNOSIS — M23203 Derangement of unspecified medial meniscus due to old tear or injury, right knee: Secondary | ICD-10-CM | POA: Diagnosis not present

## 2020-01-29 DIAGNOSIS — M23203 Derangement of unspecified medial meniscus due to old tear or injury, right knee: Secondary | ICD-10-CM | POA: Diagnosis not present

## 2020-01-29 DIAGNOSIS — M23261 Derangement of other lateral meniscus due to old tear or injury, right knee: Secondary | ICD-10-CM | POA: Diagnosis not present

## 2020-01-29 DIAGNOSIS — M25561 Pain in right knee: Secondary | ICD-10-CM | POA: Diagnosis not present

## 2020-01-30 NOTE — Telephone Encounter (Signed)
Per Dr Redmond Baseman office, they have tried to reach her x1. No appointment made yet. I called and left a voicemail for the patient with Dr Redmond Baseman contact info to schedule the appointment.

## 2020-01-31 DIAGNOSIS — M23261 Derangement of other lateral meniscus due to old tear or injury, right knee: Secondary | ICD-10-CM | POA: Diagnosis not present

## 2020-01-31 DIAGNOSIS — M25561 Pain in right knee: Secondary | ICD-10-CM | POA: Diagnosis not present

## 2020-01-31 DIAGNOSIS — M23203 Derangement of unspecified medial meniscus due to old tear or injury, right knee: Secondary | ICD-10-CM | POA: Diagnosis not present

## 2020-02-11 DIAGNOSIS — M23203 Derangement of unspecified medial meniscus due to old tear or injury, right knee: Secondary | ICD-10-CM | POA: Diagnosis not present

## 2020-02-11 DIAGNOSIS — M23261 Derangement of other lateral meniscus due to old tear or injury, right knee: Secondary | ICD-10-CM | POA: Diagnosis not present

## 2020-02-11 DIAGNOSIS — M25561 Pain in right knee: Secondary | ICD-10-CM | POA: Diagnosis not present

## 2020-02-12 NOTE — Telephone Encounter (Signed)
Patient has been scheduled for 03-04-2020

## 2020-02-19 ENCOUNTER — Telehealth: Payer: Self-pay | Admitting: Gastroenterology

## 2020-02-19 DIAGNOSIS — M23203 Derangement of unspecified medial meniscus due to old tear or injury, right knee: Secondary | ICD-10-CM | POA: Diagnosis not present

## 2020-02-19 DIAGNOSIS — M23261 Derangement of other lateral meniscus due to old tear or injury, right knee: Secondary | ICD-10-CM | POA: Diagnosis not present

## 2020-02-19 DIAGNOSIS — M25561 Pain in right knee: Secondary | ICD-10-CM | POA: Diagnosis not present

## 2020-02-19 NOTE — Telephone Encounter (Signed)
Spoke with patient regarding Pulmonology referral, called [facility] and they stated that they have her scheduled to see Dr. Rodman Pickle on 02/20/20 at 9 am. Pt is aware of appt.

## 2020-02-20 ENCOUNTER — Institutional Professional Consult (permissible substitution): Payer: BC Managed Care – PPO | Admitting: Pulmonary Disease

## 2020-02-25 ENCOUNTER — Other Ambulatory Visit: Payer: Self-pay

## 2020-02-25 ENCOUNTER — Ambulatory Visit (INDEPENDENT_AMBULATORY_CARE_PROVIDER_SITE_OTHER): Payer: BC Managed Care – PPO

## 2020-02-25 ENCOUNTER — Ambulatory Visit (INDEPENDENT_AMBULATORY_CARE_PROVIDER_SITE_OTHER): Payer: BC Managed Care – PPO | Admitting: Pulmonary Disease

## 2020-02-25 ENCOUNTER — Encounter: Payer: Self-pay | Admitting: Pulmonary Disease

## 2020-02-25 VITALS — BP 126/70 | HR 77 | Temp 98.5°F | Ht 60.0 in | Wt 138.4 lb

## 2020-02-25 DIAGNOSIS — R05 Cough: Secondary | ICD-10-CM

## 2020-02-25 DIAGNOSIS — R053 Chronic cough: Secondary | ICD-10-CM

## 2020-02-25 DIAGNOSIS — M25561 Pain in right knee: Secondary | ICD-10-CM | POA: Diagnosis not present

## 2020-02-25 NOTE — Patient Instructions (Addendum)
Nice to meet you  I think the cough is due to smoking and due to reflux and issues with swallowing. I sent a referral to the GI doctors to help with the cough when eating and drinking and reflux as this is likely a major contributor to your cough.   We will get a CXR today.  Please schedule PFTs in the next 2-4 weeks.  Come back in 3 months with Dr. Silas Flood

## 2020-02-25 NOTE — Progress Notes (Signed)
Patient ID: Emily Santiago, female    DOB: [birth year], 60 y.o.   MRN: [id number]  Chief Complaint  Patient presents with  . Pulmonary Consult    Referred by Dr. Wilfrid Lund.  Pt c/o cough for the past 6 months- non prod and tends to be worse at night and after eating, sometimes wakes her up.     Referring provider: Doran Stabler, MD  HPI:   Ms. MIREYA Santiago is a 59 y.o. woman with PMH tobacco abuse, emphysema, HTN whom we are seeing in consultation at the request of Wilfrid Lund MD for evaluation of cough. Notes from referring provider reviewed.   History obtained per patient and mother at bedside who frequently interjects.  Patient notes cough is actually been present for many years.  She thinks cough is not worsened over the last 6 months or so.  In the past, she attributed to her smoking.  She reportedly has had many episodes of bronchitis. She has been told she has COPD based on her symptoms and smoking. Never had PFTs. Requires prednisone everythime she has bronchitis. At least once a year.  2 courses this calendar year already to date.   Cough is daily. Worse when eating and drinking. Gets choked on water. Endorses sensation of solids getting stuck in throat. Cough worse when lying supine. Cough is non-productive. Endorses occasional heartburn despite PPI and H2 blocker. Endorses refractory reflux. Reports she has a hiatal hernia. Had curse of abx with no improvement. Uses Breo and thinks helps cough mildly and gives a little more breath.  She is an every day smoker. 2 ppd for about 40 years. Interested in quitting but seems more pre-contemplative at the moment.   Prior imaging reviewed and interpreted as: CXR 10/2019 - hyperinflated, no infiltrate; CT chest 03/2018 - emphysema, clear lungs.   PMH: HTN, GERD, emphysema Surgical history: Endoscopy, hysterectomy, cholecystectomy Family History: CAD, COPD in father, COPD in brother Social History: every day smoker, lives in  [place] / Pulmonary Flowsheets:   ACT:  No flowsheet data found.  MMRC: No flowsheet data found.  Epworth:  No flowsheet data found.  Tests:   FENO:  No results found for: NITRICOXIDE  PFT: No flowsheet data found.  WALK:  No flowsheet data found.  Imaging: No results found.  Lab Results:  CBC    Component Value Date/Time   WBC 8.4 12/31/2019 0938   RBC 4.26 12/31/2019 0938   HGB 13.4 12/31/2019 0938   HGB 13.4 04/22/2017 1407   HCT 39.5 12/31/2019 0938   HCT 40.5 04/22/2017 1407   PLT 293 12/31/2019 0938   PLT 219 04/22/2017 1407   MCV 92.7 12/31/2019 0938   MCV 95.1 04/22/2017 1407   MCH 31.5 12/31/2019 0938   MCHC 33.9 12/31/2019 0938   RDW 13.4 12/31/2019 0938   RDW 13.4 04/22/2017 1407   LYMPHSABS 1.7 12/31/2019 0938   LYMPHSABS 1.7 04/22/2017 1407   MONOABS 0.7 12/31/2019 0938   MONOABS 0.4 04/22/2017 1407   EOSABS 0.3 12/31/2019 0938   EOSABS 0.1 04/22/2017 1407   BASOSABS 0.0 12/31/2019 0938   BASOSABS 0.0 04/22/2017 1407    BMET    Component Value Date/Time   NA 132 (L) 12/31/2019 0938   NA 137 04/22/2017 1407   K 3.8 12/31/2019 0938   K 5.0 04/22/2017 1407   CL 94 (L) 12/31/2019 0938   CO2 29 12/31/2019 0938   CO2 24 04/22/2017 1407  GLUCOSE 57 (L) 12/31/2019 0938   GLUCOSE 77 04/22/2017 1407   BUN 11 12/31/2019 0938   BUN 21.6 04/22/2017 1407   CREATININE 0.68 12/31/2019 0938   CREATININE 0.8 04/22/2017 1407   CALCIUM 9.3 12/31/2019 0938   CALCIUM 9.5 04/22/2017 1407   GFRNONAA >60 12/31/2019 0938   GFRAA >60 12/31/2019 0938    BNP    Component Value Date/Time   BNP 16.5 02/18/2017 1659    ProBNP No results found for: PROBNP  Specialty Problems      Pulmonary Problems   Sore throat      Allergies  Allergen Reactions  . Lisinopril Cough  . Penicillins Nausea Only and Rash    Stomach upset Has patient had a PCN reaction causing immediate rash, facial/tongue/throat swelling, SOB or  lightheadedness with hypotension: No Has patient had a PCN reaction causing severe rash involving mucus membranes or skin necrosis: Yes Has patient had a PCN reaction that required hospitalization: NO Has patient had a PCN reaction occurring within the last 10 years: Yes If all of the above answers are "NO", then may proceed with Cephalosporin use.      Immunization History  Administered Date(s) Administered  . Influenza,inj,Quad PF,6+ Mos 06/01/2016, 03/15/2018  . Moderna SARS-COVID-2 Vaccination 01/14/2020, 02/13/2020    Past Medical History:  Diagnosis Date  . Anxiety   . Arthritis    WRIST AND BACK  . Atrophic vaginitis   . CKD (chronic kidney disease), stage II   . COPD (chronic obstructive pulmonary disease) ([place])   . Depression   . GERD (gastroesophageal reflux disease)   . History of Barrett's esophagus   . History of head and neck cancer   . Hx of colonic polyps   . Hyperlipidemia   . Hypertension   . Insomnia   . Lymphoma ([place])    Lymphoma  . Osteopenia   . Osteoporosis   . Plantar fasciitis   . Tobacco abuse   . Vallecular mass   . Varicose veins   . Vitamin D deficiency     Tobacco History: Social History   Tobacco Use  Smoking Status Current Every Day Smoker  . Packs/day: 2.00  . Years: 39.00  . Pack years: 78.00  . Types: Cigarettes  Smokeless Tobacco Never Used   Ready to quit: Not Answered Counseling given: Not Answered   Continue to not smoke  Outpatient Encounter Medications as of 02/25/2020  Medication Sig  . acetaminophen (TYLENOL) 500 MG tablet Take 500 mg by mouth every 6 (six) hours as needed for moderate pain.  Marland Kitchen amLODipine (NORVASC) 10 MG tablet Take 1 tablet (10 mg total) daily by mouth.  . B Complex Vitamins (B-COMPLEX/B-12) TABS Take 1 tablet by mouth daily.  . benzonatate (TESSALON) 100 MG capsule Take 200 mg by mouth every 8 (eight) hours as needed.  . chlorthalidone (HYGROTON) 25 MG tablet Take 25 mg by mouth daily.  .  Cholecalciferol (VITAMIN D3) 5000 units CAPS Take 10,000 Units by mouth daily.   . diclofenac (VOLTAREN) 50 MG EC tablet Take 50 mg by mouth 2 (two) times daily as needed.  Marland Kitchen escitalopram (LEXAPRO) 10 MG tablet Take 10 mg by mouth daily as needed (anxiety).   . famotidine (PEPCID) 40 MG tablet Take 40 mg by mouth 2 (two) times daily.   Marland Kitchen HYDROcodone-acetaminophen (NORCO/VICODIN) 5-325 MG tablet Take 1 tablet by mouth every 4 (four) hours as needed.  . hydrOXYzine (ATARAX/VISTARIL) 25 MG tablet Take 25-50 mg by mouth every  8 (eight) hours as needed for anxiety.   Marland Kitchen ibuprofen (ADVIL) 200 MG tablet Take 200 mg by mouth every 6 (six) hours as needed for moderate pain.  Marland Kitchen LORazepam (ATIVAN) 0.5 MG tablet Take 0.5 mg by mouth 2 (two) times daily as needed.  . Multiple Vitamin (MULTIVITAMIN WITH MINERALS) TABS tablet Take 1 tablet by mouth daily.  . Multiple Vitamins-Minerals (MULTIVITAMIN WITH MINERALS) tablet Take 1 tablet by mouth daily.  Marland Kitchen omeprazole (PRILOSEC) 40 MG capsule Take 40 mg by mouth 2 (two) times a day.  . polyethylene glycol (MIRALAX / GLYCOLAX) packet Take 17 g by mouth daily as needed for moderate constipation.   . rosuvastatin (CRESTOR) 5 MG tablet Take 5 mg by mouth daily.  Marland Kitchen telmisartan (MICARDIS) 40 MG tablet Take 40 mg by mouth daily.  Marland Kitchen zolpidem (AMBIEN) 10 MG tablet Take 5 mg by mouth at bedtime.   . [DISCONTINUED] BIOTIN 5000 PO Take 1 capsule by mouth daily.  . [DISCONTINUED] cephALEXin (KEFLEX) 500 MG capsule Take 500 mg by mouth 4 (four) times daily.  . [DISCONTINUED] FLUoxetine (PROZAC) 20 MG capsule Take 20 mg by mouth daily.  . [DISCONTINUED] potassium chloride (KLOR-CON) 10 MEQ tablet Take 3 tablets (30 mEq total) by mouth daily for 4 days.   Facility-Administered Encounter Medications as of 02/25/2020  Medication  . 0.9 %  sodium chloride infusion     Review of Systems  Review of Systems  No chest pain with exertion. No fever. No odynophagia. Comprehensive  review of systems otherwise negative.   Physical Exam  BP 126/70 (BP Location: Left Arm, Cuff Size: Normal)   Pulse 77   Temp 98.5 F (36.9 C) (Oral)   Ht 5' (1.524 m)   Wt 138 lb 6.4 oz (62.8 kg)   SpO2 96% Comment: on RA  BMI 27.03 kg/m   Wt Readings from Last 5 Encounters:  02/25/20 138 lb 6.4 oz (62.8 kg)  01/21/20 138 lb 4 oz (62.7 kg)  12/31/19 135 lb (61.2 kg)  12/29/18 141 lb (64 kg)  12/04/18 138 lb (62.6 kg)    BMI Readings from Last 5 Encounters:  02/25/20 27.03 kg/m  01/21/20 27.00 kg/m  12/31/19 25.51 kg/m  12/29/18 26.64 kg/m  12/04/18 26.07 kg/m     Physical Exam General: Well appearing, in NAD Eyes: EOMI, no icterus ENT: No JVP appreciated, voice is mildly hoarse Respiratory: Distant sounds, no wheeze CV: RRR, no murmurs Abdomen/GI: soft, BS present MSK: No synovitis, no joint effusions Neuro: Normal gait, no weakness Psych: Normal mood, flat affect   Assessment & Plan:   Chronic Cough: Worsening. Suspect multifactorial. Component of chronic bronchitis but no sputum production. Suspect largest contributors are refractory GERD and aspiration with dysphagia. To continue Breo. Will obtain PFTs next visit and if COPD present will move to triple therapy given frequent exacerbations. CXR ordered given ongoing cough and risk for aspiration.    Return in about 3 months (around 05/26/2020).   Lanier Clam, MD 02/25/2020

## 2020-02-26 ENCOUNTER — Telehealth: Payer: Self-pay | Admitting: Pulmonary Disease

## 2020-02-26 NOTE — Telephone Encounter (Signed)
Pt calling for cxr results from 02/25/20 please advise, thanks!

## 2020-02-27 DIAGNOSIS — M23203 Derangement of unspecified medial meniscus due to old tear or injury, right knee: Secondary | ICD-10-CM | POA: Diagnosis not present

## 2020-02-27 DIAGNOSIS — M25561 Pain in right knee: Secondary | ICD-10-CM | POA: Diagnosis not present

## 2020-02-27 DIAGNOSIS — M23261 Derangement of other lateral meniscus due to old tear or injury, right knee: Secondary | ICD-10-CM | POA: Diagnosis not present

## 2020-02-27 NOTE — Telephone Encounter (Signed)
CXR is clear. This is  expected but good news. Thanks!

## 2020-02-27 NOTE — Telephone Encounter (Signed)
Spoke with pt and notified of results per Dr Garen Grams. Pt verbalized understanding and denied any questions.

## 2020-03-04 DIAGNOSIS — R05 Cough: Secondary | ICD-10-CM | POA: Diagnosis not present

## 2020-03-04 DIAGNOSIS — J343 Hypertrophy of nasal turbinates: Secondary | ICD-10-CM | POA: Diagnosis not present

## 2020-03-04 DIAGNOSIS — F17210 Nicotine dependence, cigarettes, uncomplicated: Secondary | ICD-10-CM | POA: Diagnosis not present

## 2020-03-12 DIAGNOSIS — M23203 Derangement of unspecified medial meniscus due to old tear or injury, right knee: Secondary | ICD-10-CM | POA: Diagnosis not present

## 2020-03-12 DIAGNOSIS — M25561 Pain in right knee: Secondary | ICD-10-CM | POA: Diagnosis not present

## 2020-03-12 DIAGNOSIS — M23261 Derangement of other lateral meniscus due to old tear or injury, right knee: Secondary | ICD-10-CM | POA: Diagnosis not present

## 2020-03-26 ENCOUNTER — Other Ambulatory Visit: Payer: Self-pay

## 2020-03-26 ENCOUNTER — Ambulatory Visit (INDEPENDENT_AMBULATORY_CARE_PROVIDER_SITE_OTHER): Payer: BC Managed Care – PPO | Admitting: Pulmonary Disease

## 2020-03-26 DIAGNOSIS — R053 Chronic cough: Secondary | ICD-10-CM | POA: Diagnosis not present

## 2020-03-26 DIAGNOSIS — M23203 Derangement of unspecified medial meniscus due to old tear or injury, right knee: Secondary | ICD-10-CM | POA: Diagnosis not present

## 2020-03-26 DIAGNOSIS — M23261 Derangement of other lateral meniscus due to old tear or injury, right knee: Secondary | ICD-10-CM | POA: Diagnosis not present

## 2020-03-26 DIAGNOSIS — M25561 Pain in right knee: Secondary | ICD-10-CM | POA: Diagnosis not present

## 2020-03-26 LAB — PULMONARY FUNCTION TEST
DL/VA % pred: 81 %
DL/VA: 3.52 ml/min/mmHg/L
DLCO cor % pred: 80 %
DLCO cor: 14.63 ml/min/mmHg
DLCO unc % pred: 80 %
DLCO unc: 14.63 ml/min/mmHg
FEF 25-75 Post: 1.68 L/sec
FEF 25-75 Pre: 0.99 L/sec
FEF2575-%Change-Post: 70 %
FEF2575-%Pred-Post: 77 %
FEF2575-%Pred-Pre: 45 %
FEV1-%Change-Post: 15 %
FEV1-%Pred-Post: 78 %
FEV1-%Pred-Pre: 67 %
FEV1-Post: 1.78 L
FEV1-Pre: 1.55 L
FEV1FVC-%Change-Post: 2 %
FEV1FVC-%Pred-Pre: 89 %
FEV6-%Change-Post: 11 %
FEV6-%Pred-Post: 84 %
FEV6-%Pred-Pre: 75 %
FEV6-Post: 2.4 L
FEV6-Pre: 2.16 L
FEV6FVC-%Change-Post: 0 %
FEV6FVC-%Pred-Post: 101 %
FEV6FVC-%Pred-Pre: 101 %
FVC-%Change-Post: 12 %
FVC-%Pred-Post: 84 %
FVC-%Pred-Pre: 74 %
FVC-Post: 2.49 L
FVC-Pre: 2.21 L
Post FEV1/FVC ratio: 72 %
Post FEV6/FVC ratio: 98 %
Pre FEV1/FVC ratio: 70 %
Pre FEV6/FVC Ratio: 98 %
RV % pred: 136 %
RV: 2.52 L
TLC % pred: 102 %
TLC: 4.73 L

## 2020-03-26 NOTE — Progress Notes (Signed)
PFT done otday.

## 2020-03-31 ENCOUNTER — Telehealth: Payer: Self-pay | Admitting: Pulmonary Disease

## 2020-03-31 MED ORDER — BREO ELLIPTA 200-25 MCG/INH IN AEPB: 1.0000 | INHALATION_SPRAY | Freq: Every day | RESPIRATORY_TRACT | 5 refills | Status: DC

## 2020-03-31 NOTE — Telephone Encounter (Signed)
Hunsucker, Bonna Gains, MD  You 58 minutes ago (2:51 PM)  MH High dose would be preferred. Thanks!   Message text      Rx for Breo 200 has been sent to preferred pharmacy for pt. Nothing further needed.

## 2020-03-31 NOTE — Telephone Encounter (Signed)
She had a big improvement in her breathing after albuterol was  given. Otherwise they were pretty normal. We can see this with asthma. She was taking Breo which would help with this. Recommend she continue this medication.

## 2020-03-31 NOTE — Telephone Encounter (Signed)
Pt called requesting to know the results of the PFT which was performed on 03/26/20. Dr. Silas Flood, please advise.

## 2020-03-31 NOTE — Telephone Encounter (Signed)
Called and spoke with pt letting her know the results of the PFT and she verbalized understanding. Stated to her to continue taking Breo as it would help with her symptoms and she verbalized understanding.  After further reviewing pt's medication list, I am not seeing Breo on her med list. Dr. Silas Flood, please advise if Breo 100 or Breo 200 should be sent in for pt. Pharmacy is [facility] in [place], [place] off [address].

## 2020-04-14 ENCOUNTER — Ambulatory Visit: Payer: BC Managed Care – PPO | Admitting: Gastroenterology

## 2020-04-15 DIAGNOSIS — F419 Anxiety disorder, unspecified: Secondary | ICD-10-CM | POA: Diagnosis not present

## 2020-04-15 DIAGNOSIS — I10 Essential (primary) hypertension: Secondary | ICD-10-CM | POA: Diagnosis not present

## 2020-04-15 DIAGNOSIS — E7800 Pure hypercholesterolemia, unspecified: Secondary | ICD-10-CM | POA: Diagnosis not present

## 2020-04-15 DIAGNOSIS — M542 Cervicalgia: Secondary | ICD-10-CM | POA: Diagnosis not present

## 2020-04-15 DIAGNOSIS — R079 Chest pain, unspecified: Secondary | ICD-10-CM | POA: Diagnosis not present

## 2020-04-21 DIAGNOSIS — S83001D Unspecified subluxation of right patella, subsequent encounter: Secondary | ICD-10-CM | POA: Diagnosis not present

## 2020-05-19 DIAGNOSIS — M25561 Pain in right knee: Secondary | ICD-10-CM | POA: Diagnosis not present

## 2020-05-26 DIAGNOSIS — M4722 Other spondylosis with radiculopathy, cervical region: Secondary | ICD-10-CM | POA: Diagnosis not present

## 2020-05-26 DIAGNOSIS — M542 Cervicalgia: Secondary | ICD-10-CM | POA: Diagnosis not present

## 2020-05-28 DIAGNOSIS — M4722 Other spondylosis with radiculopathy, cervical region: Secondary | ICD-10-CM | POA: Diagnosis not present

## 2020-05-28 DIAGNOSIS — M542 Cervicalgia: Secondary | ICD-10-CM | POA: Diagnosis not present

## 2020-05-30 DIAGNOSIS — M25561 Pain in right knee: Secondary | ICD-10-CM | POA: Diagnosis not present

## 2020-06-04 ENCOUNTER — Ambulatory Visit: Payer: BC Managed Care – PPO | Admitting: Gastroenterology

## 2020-06-14 DIAGNOSIS — U071 COVID-19: Secondary | ICD-10-CM

## 2020-06-14 HISTORY — DX: COVID-19: U07.1

## 2020-07-09 ENCOUNTER — Emergency Department (HOSPITAL_COMMUNITY): Payer: 59

## 2020-07-09 ENCOUNTER — Other Ambulatory Visit: Payer: Self-pay

## 2020-07-09 ENCOUNTER — Emergency Department (HOSPITAL_COMMUNITY)
Admission: EM | Admit: 2020-07-09 | Discharge: 2020-07-10 | Disposition: A | Discharge status: Home / Self Care | Payer: 59 | Attending: Emergency Medicine | Admitting: Emergency Medicine

## 2020-07-09 DIAGNOSIS — J449 Chronic obstructive pulmonary disease, unspecified: Secondary | ICD-10-CM | POA: Diagnosis not present

## 2020-07-09 DIAGNOSIS — Z79899 Other long term (current) drug therapy: Secondary | ICD-10-CM | POA: Insufficient documentation

## 2020-07-09 DIAGNOSIS — K2970 Gastritis, unspecified, without bleeding: Secondary | ICD-10-CM | POA: Insufficient documentation

## 2020-07-09 DIAGNOSIS — I129 Hypertensive chronic kidney disease with stage 1 through stage 4 chronic kidney disease, or unspecified chronic kidney disease: Secondary | ICD-10-CM | POA: Insufficient documentation

## 2020-07-09 DIAGNOSIS — N182 Chronic kidney disease, stage 2 (mild): Secondary | ICD-10-CM | POA: Diagnosis not present

## 2020-07-09 DIAGNOSIS — Z8711 Personal history of peptic ulcer disease: Secondary | ICD-10-CM | POA: Diagnosis not present

## 2020-07-09 DIAGNOSIS — Z7951 Long term (current) use of inhaled steroids: Secondary | ICD-10-CM | POA: Diagnosis not present

## 2020-07-09 DIAGNOSIS — U071 COVID-19: Secondary | ICD-10-CM | POA: Insufficient documentation

## 2020-07-09 DIAGNOSIS — R1013 Epigastric pain: Secondary | ICD-10-CM

## 2020-07-09 DIAGNOSIS — K2900 Acute gastritis without bleeding: Secondary | ICD-10-CM

## 2020-07-09 DIAGNOSIS — F17210 Nicotine dependence, cigarettes, uncomplicated: Secondary | ICD-10-CM | POA: Diagnosis not present

## 2020-07-09 DIAGNOSIS — R0602 Shortness of breath: Secondary | ICD-10-CM | POA: Diagnosis present

## 2020-07-09 LAB — BASIC METABOLIC PANEL
Anion gap: 10 (ref 5–15)
BUN: 13 mg/dL (ref 8–23)
CO2: 29 mmol/L (ref 22–32)
Calcium: 9.2 mg/dL (ref 8.9–10.3)
Chloride: 88 mmol/L — ABNORMAL LOW (ref 98–111)
Creatinine, Ser: 0.62 mg/dL (ref 0.44–1.00)
GFR, Estimated: >60 mL/min (ref >60)
Glucose, Bld: 155 mg/dL — ABNORMAL HIGH (ref 70–99)
Potassium: 3.4 mmol/L — ABNORMAL LOW (ref 3.5–5.1)
Sodium: 127 mmol/L — ABNORMAL LOW (ref 135–145)

## 2020-07-09 LAB — CBC
HCT: 39.4 % (ref 36.0–46.0)
Hemoglobin: 13.9 g/dL (ref 12.0–15.0)
MCH: 31.9 pg (ref 26.0–34.0)
MCHC: 35.3 g/dL (ref 30.0–36.0)
MCV: 90.4 fL (ref 80.0–100.0)
Platelets: 290 10*3/uL (ref 150–400)
RBC: 4.36 MIL/uL (ref 3.87–5.11)
RDW: 13.2 % (ref 11.5–15.5)
WBC: 11.4 10*3/uL — ABNORMAL HIGH (ref 4.0–10.5)
nRBC: 0 % (ref 0.0–0.2)

## 2020-07-09 LAB — TROPONIN I (HIGH SENSITIVITY): Troponin I (High Sensitivity): 3 ng/L (ref <18)

## 2020-07-09 LAB — LIPASE, BLOOD: Lipase: 29 U/L (ref 11–51)

## 2020-07-09 MED ORDER — ONDANSETRON HCL 4 MG/2ML IJ SOLN
4.0000 mg | Freq: Once | INTRAMUSCULAR | Status: AC
  Administered 2020-07-09: 4 mg via INTRAVENOUS
  Filled 2020-07-09: qty 2

## 2020-07-09 MED ORDER — SUCRALFATE 1 G PO TABS
1.0000 g | ORAL_TABLET | Freq: Once | ORAL | Status: AC
  Administered 2020-07-09: 1 g via ORAL
  Filled 2020-07-09: qty 1

## 2020-07-09 MED ORDER — FAMOTIDINE IN NACL 20-0.9 MG/50ML-% IV SOLN
20.0000 mg | Freq: Once | INTRAVENOUS | Status: AC
  Administered 2020-07-09: 20 mg via INTRAVENOUS
  Filled 2020-07-09: qty 50

## 2020-07-09 MED ORDER — ALBUTEROL SULFATE HFA 108 (90 BASE) MCG/ACT IN AERS
8.0000 | INHALATION_SPRAY | Freq: Once | RESPIRATORY_TRACT | Status: AC
  Administered 2020-07-09: 8 via RESPIRATORY_TRACT
  Filled 2020-07-09: qty 6.7

## 2020-07-09 MED ORDER — SODIUM CHLORIDE 0.9 % IV BOLUS
1000.0000 mL | Freq: Once | INTRAVENOUS | Status: AC
  Administered 2020-07-09: 1000 mL via INTRAVENOUS

## 2020-07-09 MED ORDER — AEROCHAMBER PLUS FLO-VU SMALL MISC
1.0000 | Freq: Once | Status: AC
  Administered 2020-07-09: 1
  Filled 2020-07-09 (×2): qty 1

## 2020-07-09 MED ORDER — PANTOPRAZOLE SODIUM 40 MG IV SOLR
40.0000 mg | Freq: Once | INTRAVENOUS | Status: AC
  Administered 2020-07-09: 40 mg via INTRAVENOUS
  Filled 2020-07-09: qty 40

## 2020-07-09 MED ORDER — FENTANYL CITRATE (PF) 100 MCG/2ML IJ SOLN
50.0000 ug | Freq: Once | INTRAMUSCULAR | Status: AC
  Administered 2020-07-09: 50 ug via INTRAVENOUS
  Filled 2020-07-09: qty 2

## 2020-07-09 NOTE — ED Notes (Signed)
Pt ambulated in room while monitoring oxygen saturation.  Pt's oxygen maintained at 96-97% throughout.

## 2020-07-09 NOTE — ED Provider Notes (Signed)
[facility] DEPT Provider Note   CSN: [id number] Arrival date & time: 07/09/20  1649     History Chief Complaint  Patient presents with  . Covid Positive  . Chest Pain    Emily Santiago is a 60 y.o. 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  The patient reports that she tested positive for COVID-19 on January 14.  She reports associated cough, fatigue, generalized weakness, diarrhea, headache, body aches, and loss of sense of taste and smell since she tested positive.  No vomiting, melena, hematochezia, syncope, numbness.  However, she reports that over the last 2 to 3 days shortness of breath has significantly worsened and today, she developed sharp, mid to left-sided chest pain and epigastric pain that radiates through to her mid back and between her shoulder blades.  Pain is pleuritic.  No other known aggravating or alleviating factors.  She states that she is having some numbness and tingling down her left arm, but states that this is chronic from a neck injury.  No new numbness or focal weakness.  She reports that she has been taking 4 Aleve daily since her symptoms began 2 weeks ago.  She has a history of a gastric ulcer.  Patient is fully vaccinated against COVID-19.  The history is provided by the patient and medical records. No language interpreter was used.       Past Medical History:  Diagnosis Date  . Anxiety   . Arthritis    WRIST AND BACK  . Atrophic vaginitis   . CKD (chronic kidney disease), stage II   . COPD (chronic obstructive pulmonary disease) ([place])   . Depression   . GERD (gastroesophageal reflux disease)   . History of Barrett's esophagus   . History of head and neck  cancer   . Hx of colonic polyps   . Hyperlipidemia   . Hypertension   . Insomnia   . Lymphoma ([place])    Lymphoma  . Osteopenia   . Osteoporosis   . Plantar fasciitis   . Tobacco abuse   . Vallecular mass   . Varicose veins   . Vitamin D deficiency     Patient Active Problem List   Diagnosis Date Noted  . Multiple complaints 12/31/2019  . Weight loss 03/23/2018  . Family discord 04/22/2017  . Insomnia 04/22/2017  . Epigastric pain 02/19/2017  . Chest pain 02/18/2017  . Hypotension due to drugs 02/18/2017  . Hypokalemia 02/18/2017  . Chronic fatigue 01/21/2017  . DJD (degenerative joint disease) of cervical spine 10/15/2016  . Dysuria 07/05/2016  . Other constipation 06/22/2016  . Mucositis due to antineoplastic therapy 06/08/2016  . Hypotension 06/03/2016  . Sore throat 05/04/2016  . Dysphagia 04/22/2016  . Gastroesophageal reflux disease 04/22/2016  . Follicular lymphoma grade iiia, lymph nodes of head, face, and neck ([place]) 04/21/2016  . Essential hypertension 04/21/2016  . Tobacco abuse 04/20/2016  . Pain of left lower extremity-Thigh / Leg 02/05/2014  . Varicose veins of lower extremities with other complications 95/02/3266    Past Surgical History:  Procedure Laterality Date  . ABDOMINAL HYSTERECTOMY    . APPENDECTOMY    . BILATERAL SALPINGOOPHORECTOMY    . CHOLECYSTECTOMY    . ESOPHAGEAL MANOMETRY N/A 04/28/2016   Procedure:  ESOPHAGEAL MANOMETRY (EM);  Surgeon: Mauri Pole, MD;  Location: [facility] ENDOSCOPY;  Service: Endoscopy;  Laterality: N/A;  dr. Loletha Carrow  . KNEE ARTHROSCOPY    . LYMPH NODE BIOPSY    . TONSILLECTOMY    . UPPER GASTROINTESTINAL ENDOSCOPY    . WRIST SURGERY Right      OB History   No obstetric history on file.     Family History  Problem Relation Age of Onset  . Heart disease Father   . Colon cancer Father        dx in his 43's  . Diabetes Father        metastatic  . Colon polyps Father   . Heart disease Maternal Grandmother    . Breast cancer Maternal Grandmother   . Prostate cancer Maternal Uncle   . Brain cancer Brother   . Liver cancer Brother   . Bone cancer Brother   . Prostate cancer Brother   . Hyperlipidemia Mother   . Colon polyps Mother   . Breast cancer Cousin        x3  . Stomach cancer Neg Hx   . Pancreatic cancer Neg Hx   . Esophageal cancer Neg Hx   . Rectal cancer Neg Hx     Social History   Tobacco Use  . Smoking status: Current Every Day Smoker    Packs/day: 2.00    Years: 39.00    Pack years: 78.00    Types: Cigarettes  . Smokeless tobacco: Never Used  Vaping Use  . Vaping Use: Never used  Substance Use Topics  . Alcohol use: No  . Drug use: No    Home Medications Prior to Admission medications   Medication Sig Start Date End Date Taking? Authorizing Provider  lidocaine (XYLOCAINE) 2 % solution Swallow 15 mL every 3 hours as needed for upper abdominal pain. 07/10/20  Yes Prateek Knipple A, PA-C  sucralfate (CARAFATE) 1 g tablet Take 1 tablet (1 g total) by mouth 4 (four) times daily. 07/10/20 08/09/20 Yes Mckay Brandt A, PA-C  acetaminophen (TYLENOL) 500 MG tablet Take 500 mg by mouth every 6 (six) hours as needed for moderate pain.    [provider]  amLODipine (NORVASC) 10 MG tablet Take 1 tablet (10 mg total) daily by mouth. 04/22/17   Heath Lark, MD  B Complex Vitamins (B-COMPLEX/B-12) TABS Take 1 tablet by mouth daily.    [provider]  benzonatate (TESSALON) 100 MG capsule Take 200 mg by mouth every 8 (eight) hours as needed. 01/13/20   [provider]  chlorthalidone (HYGROTON) 25 MG tablet Take 25 mg by mouth daily. 10/10/19   [provider]  Cholecalciferol (VITAMIN D3) 5000 units CAPS Take 10,000 Units by mouth daily.     [provider]  diclofenac (VOLTAREN) 50 MG EC tablet Take 50 mg by mouth 2 (two) times daily as needed. 12/27/19   [provider]  escitalopram (LEXAPRO) 10 MG tablet Take 10 mg by mouth daily  as needed (anxiety).  05/03/18   [provider]  famotidine (PEPCID) 40 MG tablet Take 40 mg by mouth 2 (two) times daily.  10/08/19   [provider]  fluticasone furoate-vilanterol (BREO ELLIPTA) 200-25 MCG/INH AEPB Inhale 1 puff into the lungs daily. 03/31/20   Hunsucker, Bonna Gains, MD  HYDROcodone-acetaminophen (NORCO/VICODIN) 5-325 MG tablet Take 1 tablet by mouth every 4 (four) hours as needed. 12/20/19   [provider]  hydrOXYzine (ATARAX/VISTARIL) 25 MG tablet Take  25-50 mg by mouth every 8 (eight) hours as needed for anxiety.  03/15/18   [provider]  ibuprofen (ADVIL) 200 MG tablet Take 200 mg by mouth every 6 (six) hours as needed for moderate pain.    [provider]  LORazepam (ATIVAN) 0.5 MG tablet Take 0.5 mg by mouth 2 (two) times daily as needed. 01/19/20   [provider]  Multiple Vitamin (MULTIVITAMIN WITH MINERALS) TABS tablet Take 1 tablet by mouth daily.    [provider]  Multiple Vitamins-Minerals (MULTIVITAMIN WITH MINERALS) tablet Take 1 tablet by mouth daily.    [provider]  omeprazole (PRILOSEC) 40 MG capsule Take 40 mg by mouth 2 (two) times a day.    [provider]  polyethylene glycol (MIRALAX / GLYCOLAX) packet Take 17 g by mouth daily as needed for moderate constipation.     [provider]  rosuvastatin (CRESTOR) 5 MG tablet Take 5 mg by mouth daily.    [provider]  telmisartan (MICARDIS) 40 MG tablet Take 40 mg by mouth daily.    [provider]  zolpidem (AMBIEN) 10 MG tablet Take 5 mg by mouth at bedtime.  03/16/18   [provider]    Allergies    Lisinopril and Penicillins  Review of Systems   Review of Systems  Constitutional: Positive for fatigue. Negative for activity change, chills and fever.  HENT: Positive for sore throat. Negative for congestion.        Loss of sense of taste and smell  Respiratory: Positive for cough and  shortness of breath. Negative for chest tightness and wheezing.   Cardiovascular: Negative for chest pain, palpitations and leg swelling.  Gastrointestinal: Positive for abdominal pain, diarrhea and nausea. Negative for blood in stool and vomiting.  Genitourinary: Negative for dysuria.  Musculoskeletal: Positive for myalgias. Negative for back pain, gait problem, neck pain and neck stiffness.  Skin: Negative for color change, rash and wound.  Allergic/Immunologic: Negative for immunocompromised state.  Neurological: Negative for seizures, syncope, weakness, numbness and headaches.  Psychiatric/Behavioral: Negative for confusion.    Physical Exam Updated Vital Signs BP (!) 107/57   Pulse 71   Temp 97.8 F (36.6 C) (Oral)   Resp 15   Ht 5\' 1"  (1.549 m)   Wt 60.3 kg   SpO2 94%   BMI 25.13 kg/m   Physical Exam Vitals and nursing note reviewed.  Constitutional:      General: She is not in acute distress.    Appearance: She is not ill-appearing, toxic-appearing or diaphoretic.     Comments: Well appearing. No acute distress.   HENT:     Head: Normocephalic.     Nose: No congestion or rhinorrhea.     Mouth/Throat:     Mouth: Mucous membranes are moist.  Eyes:     Extraocular Movements: Extraocular movements intact.     Conjunctiva/sclera: Conjunctivae normal.     Pupils: Pupils are equal, round, and reactive to light.  Cardiovascular:     Rate and Rhythm: Normal rate and regular rhythm.     Pulses: Normal pulses.     Heart sounds: Normal heart sounds. No murmur heard. No friction rub. No gallop.   Pulmonary:     Effort: Pulmonary effort is normal. No respiratory distress.     Breath sounds: No stridor. No wheezing, rhonchi or rales.  Chest:     Chest wall: Tenderness present.  Abdominal:     General: There is no distension.  Palpations: Abdomen is soft. There is no mass.     Tenderness: There is abdominal tenderness. There is no right CVA tenderness, left CVA  tenderness, guarding or rebound.     Hernia: No hernia is present.     Comments: Tender to palpation in the epigastric region.  There is voluntary guarding.  No rebound.  Abdomen is soft and nondistended.  Negative Murphy sign.  No CVA tenderness bilaterally.  No tenderness over McBurney's point.  Normoactive bowel sounds.  Musculoskeletal:        General: No tenderness.     Cervical back: Neck supple.     Right lower leg: No edema.     Left lower leg: No edema.  Skin:    General: Skin is warm.     Capillary Refill: Capillary refill takes less than 2 seconds.     Coloration: Skin is not jaundiced.     Findings: No rash.  Neurological:     General: No focal deficit present.     Mental Status: She is alert.  Psychiatric:        Behavior: Behavior normal.     ED Results / Procedures / Treatments   Labs (all labs ordered are listed, but only abnormal results are displayed) Labs Reviewed  BASIC METABOLIC PANEL - Abnormal; Notable for the following components:      Result Value   Sodium 127 (*)    Potassium 3.4 (*)    Chloride 88 (*)    Glucose, Bld 155 (*)    All other components within normal limits  CBC - Abnormal; Notable for the following components:   WBC 11.4 (*)    All other components within normal limits  LIPASE, BLOOD  TROPONIN I (HIGH SENSITIVITY)  TROPONIN I (HIGH SENSITIVITY)    EKG EKG Interpretation  Date/Time:  Thursday July 10 2020 00:16:19 EST Ventricular Rate:  73 PR Interval:    QRS Duration: 123 QT Interval:  422 QTC Calculation: 465 R Axis:   54 Text Interpretation: Sinus rhythm Probable left atrial enlargement Nonspecific intraventricular conduction delay Confirmed by Randal Buba, April ([id number]) on 07/10/2020 12:19:13 AM   Radiology DG Chest 2 View  Result Date: 07/09/2020 CLINICAL DATA:  60 year old female with shortness of breath. Positive COVID-19. EXAM: CHEST - 2 VIEW COMPARISON:  Chest radiograph dated 02/25/2020. FINDINGS: No focal  consolidation, pleural effusion or pneumothorax. The cardiac silhouette is within limits. No acute osseous pathology. Degenerative changes of spine. IMPRESSION: No active cardiopulmonary disease. Electronically Signed   By: Anner Crete M.D.   On: 07/09/2020 17:22    Procedures Procedures   Medications Ordered in ED Medications  alum & mag hydroxide-simeth (MAALOX/MYLANTA) 200-200-20 MG/5ML suspension 30 mL (has no administration in time range)    And  lidocaine (XYLOCAINE) 2 % viscous mouth solution 15 mL (has no administration in time range)  ondansetron (ZOFRAN) injection 4 mg (4 mg Intravenous Given 07/09/20 2331)  albuterol (VENTOLIN HFA) 108 (90 Base) MCG/ACT inhaler 8 puff (8 puffs Inhalation Given 07/09/20 2335)  AeroChamber Plus Flo-Vu Small device MISC 1 each (1 each Other Given 07/09/20 2339)  sodium chloride 0.9 % bolus 1,000 mL (0 mLs Intravenous Stopped 07/10/20 0052)  famotidine (PEPCID) IVPB 20 mg premix (0 mg Intravenous Stopped 07/10/20 0052)  fentaNYL (SUBLIMAZE) injection 50 mcg (50 mcg Intravenous Given 07/09/20 2356)  sucralfate (CARAFATE) tablet 1 g (1 g Oral Given 07/09/20 2356)  pantoprazole (PROTONIX) injection 40 mg (40 mg Intravenous Given 07/09/20 2356)    ED  Course  I have reviewed the triage vital signs and the nursing notes.  Pertinent labs & imaging results that were available during my care of the patient were reviewed by me and considered in my medical decision making (see chart for details).  Clinical Course as of 07/10/20 0111  Wed Jul 09, 2020  2338 Attempted to call and update the patient's mother, as requested by the patient, unsuccessfully.  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  She was diagnosed with COVID-19 on January 14 and has been having diarrhea, loss of sense of taste and smell, daily headaches, and nausea.  Vital signs are reassuring.  Patient was ambulated in the department and had no evidence of hypoxia.  On exam, maximal tenderness palpation is in the epigastric region.  She reports that she has been taking 4 tablets of Aleve daily for the last 2 weeks.  Per chart review, she has a history of a gastric ulcer.  No melena or hematochezia.  The patient was discussed with Dr. Randal Buba, attending physician.  Labs and imaging have been reviewed and independently interpreted by me.  Differential diagnosis includes COVID-19, PE, ACS, aortic dissection, community-acquired pneumonia, PUD, pericarditis, myocarditis, esophageal rupture.  Chest x-ray is unremarkable.  Mild leukocytosis.  Metabolic panel with hyponatremia to 127.  She does have a history of similar.  She denies any alcohol use.  She has been having some poor p.o. intake and diarrhea will fluid resuscitate and reassess.  Orthostatic vital signs are negative.  Regarding her pain, her PERC score is 1 due to her age.  She has no hypoxia or tachycardia.  Her only other risk factor for PE is COVID-19.  However, given that she has been taking Aleve for the last 2 weeks, I am more suspicious that she has gastritis or PUD.  Will give pantoprazole, Pepcid, Carafate, and reassess.  On reevaluation, the patient feels much improved.  She is tolerating fluids and eating a [place] sandwich.  Discussed that she should avoid all NSAID medications.  We will discharge the patient with Carafate and viscous lidocaine.  She has been encouraged to continue her home omeprazole.  She is to follow-up with GI if symptoms do not  significantly improve within the next week.  She can also use albuterol as needed for shortness of breath, but I suspect that most of her shortness of breath was coming from gastritis or irritation of her previous gastric ulcer.  She is hemodynamically stable and in no acute distress.  Safe for discharge to home with outpatient follow-up as discussed.    Final Clinical Impression(s) / ED Diagnoses Final diagnoses:  COVID-19  Epigastric pain  Personal history of gastric ulcer  Acute gastritis without hemorrhage, unspecified gastritis type    Rx / DC Orders ED Discharge Orders         Ordered    lidocaine (XYLOCAINE) 2 % solution  07/10/20 0107    sucralfate (CARAFATE) 1 g tablet  4 times daily        07/10/20 0107           Willodean Leven, Laymond Purser, PA-C 07/10/20 0111    Palumbo, April, MD 07/10/20 1975

## 2020-07-09 NOTE — ED Notes (Signed)
Patient ambulated to bedside toilet.

## 2020-07-09 NOTE — ED Triage Notes (Signed)
Pt states that she has been Covid Positive since 1/14 and has started having chest pain, SOB and lethargy. Alert and oriented.

## 2020-07-10 ENCOUNTER — Encounter (HOSPITAL_COMMUNITY): Payer: Self-pay | Admitting: Emergency Medicine

## 2020-07-10 ENCOUNTER — Other Ambulatory Visit: Payer: Self-pay

## 2020-07-10 LAB — TROPONIN I (HIGH SENSITIVITY): Troponin I (High Sensitivity): 3 ng/L (ref <18)

## 2020-07-10 MED ORDER — LIDOCAINE VISCOUS HCL 2 % MT SOLN
15.0000 mL | Freq: Once | OROMUCOSAL | Status: AC
  Administered 2020-07-10: 15 mL via ORAL
  Filled 2020-07-10: qty 15

## 2020-07-10 MED ORDER — SUCRALFATE 1 G PO TABS: 1.0000 g | ORAL_TABLET | Freq: Four times a day (QID) | ORAL | 0 refills | Status: DC

## 2020-07-10 MED ORDER — ALUM & MAG HYDROXIDE-SIMETH 200-200-20 MG/5ML PO SUSP
30.0000 mL | Freq: Once | ORAL | Status: AC
  Administered 2020-07-10: 30 mL via ORAL
  Filled 2020-07-10: qty 30

## 2020-07-10 MED ORDER — LIDOCAINE VISCOUS HCL 2 % MT SOLN: OROMUCOSAL | 0 refills | Status: DC

## 2020-07-10 NOTE — Discharge Instructions (Signed)
Thank you for allowing me to care for you today in the Emergency Department.   You can use 2 puffs of the albuterol inhaler with a spacer every 4 hours as needed for chest tightness or shortness of breath.  Please refrain from using Aleve, naproxen, Motrin, or ibuprofen for your symptoms as I suspect this is irritating the lining of your stomach.  You should avoid these medications in the future since you have a history of an ulcer.  At home, continue to take your home omeprazole as prescribed by Dr. Loletha Carrow.  Take 1 tablet of Carafate every 6 hours.  You can swallow 15 mL of viscous lidocaine every 3 hours as needed.  Return to the emergency department if you develop black or bloody stool, if your fingers or lips turn blue, if you pass out, if you develop respiratory distress, or other new, concerning symptoms.

## 2020-07-10 NOTE — ED Notes (Signed)
Patient provided with [place] sandwich and a drink.

## 2020-08-21 ENCOUNTER — Encounter: Payer: Self-pay | Admitting: *Deleted

## 2020-08-27 ENCOUNTER — Ambulatory Visit: Payer: 59 | Admitting: Gastroenterology

## 2020-09-23 ENCOUNTER — Encounter: Payer: Self-pay | Admitting: Gastroenterology

## 2020-12-30 ENCOUNTER — Inpatient Hospital Stay: Payer: 59 | Attending: Hematology and Oncology | Admitting: Hematology and Oncology

## 2020-12-30 ENCOUNTER — Other Ambulatory Visit: Payer: Self-pay

## 2020-12-30 ENCOUNTER — Inpatient Hospital Stay: Payer: 59

## 2020-12-30 ENCOUNTER — Encounter: Payer: Self-pay | Admitting: Hematology and Oncology

## 2020-12-30 VITALS — BP 172/83 | HR 65 | Temp 97.3°F | Resp 18 | Ht 61.0 in | Wt 143.4 lb

## 2020-12-30 DIAGNOSIS — C8231 Follicular lymphoma grade IIIa, lymph nodes of head, face, and neck: Secondary | ICD-10-CM

## 2020-12-30 DIAGNOSIS — C8230 Follicular lymphoma grade IIIa, unspecified site: Secondary | ICD-10-CM | POA: Insufficient documentation

## 2020-12-30 DIAGNOSIS — I10 Essential (primary) hypertension: Secondary | ICD-10-CM

## 2020-12-30 DIAGNOSIS — Z79899 Other long term (current) drug therapy: Secondary | ICD-10-CM | POA: Diagnosis not present

## 2020-12-30 LAB — COMPREHENSIVE METABOLIC PANEL
ALT: 11 U/L (ref 0–44)
AST: 16 U/L (ref 15–41)
Albumin: 3.9 g/dL (ref 3.5–5.0)
Alkaline Phosphatase: 81 U/L (ref 38–126)
Anion gap: 9 (ref 5–15)
BUN: 9 mg/dL (ref 8–23)
CO2: 30 mmol/L (ref 22–32)
Calcium: 9.3 mg/dL (ref 8.9–10.3)
Chloride: 100 mmol/L (ref 98–111)
Creatinine, Ser: 0.62 mg/dL (ref 0.44–1.00)
GFR, Estimated: >60 mL/min (ref >60)
Glucose, Bld: 76 mg/dL (ref 70–99)
Potassium: 3.9 mmol/L (ref 3.5–5.1)
Sodium: 139 mmol/L (ref 135–145)
Total Bilirubin: 0.4 mg/dL (ref 0.3–1.2)
Total Protein: 7.2 g/dL (ref 6.5–8.1)

## 2020-12-30 LAB — CBC WITH DIFFERENTIAL/PLATELET
Abs Immature Granulocytes: 0.02 10*3/uL (ref 0.00–0.07)
Basophils Absolute: 0 10*3/uL (ref 0.0–0.1)
Basophils Relative: 0 %
Eosinophils Absolute: 0.1 10*3/uL (ref 0.0–0.5)
Eosinophils Relative: 2 %
HCT: 41.3 % (ref 36.0–46.0)
Hemoglobin: 13.9 g/dL (ref 12.0–15.0)
Immature Granulocytes: 0 %
Lymphocytes Relative: 26 %
Lymphs Abs: 1.6 10*3/uL (ref 0.7–4.0)
MCH: 30.8 pg (ref 26.0–34.0)
MCHC: 33.7 g/dL (ref 30.0–36.0)
MCV: 91.4 fL (ref 80.0–100.0)
Monocytes Absolute: 0.4 10*3/uL (ref 0.1–1.0)
Monocytes Relative: 7 %
Neutro Abs: 3.7 10*3/uL (ref 1.7–7.7)
Neutrophils Relative %: 65 %
Platelets: 213 10*3/uL (ref 150–400)
RBC: 4.52 MIL/uL (ref 3.87–5.11)
RDW: 12.7 % (ref 11.5–15.5)
WBC: 5.9 10*3/uL (ref 4.0–10.5)
nRBC: 0 % (ref 0.0–0.2)

## 2020-12-30 NOTE — Assessment & Plan Note (Signed)
She has no signs or symptoms to suggest cancer recurrence Her last CT imaging showed no evidence of disease The patient is educated to watch out for signs and symptoms of cancer recurrence such as palpable lymphadenopathy, unexplained weight loss or night sweats I emphasized the importance of annual influenza vaccination I will see her back in a year for further follow-up There is no benefit for routine surveillance imaging study

## 2020-12-30 NOTE — Assessment & Plan Note (Signed)
The patient is on multiple different blood pressure medications Her blood pressure is high today likely due to anxiety I recommend close follow-up with her primary care doctor for medication adjustment

## 2020-12-30 NOTE — Progress Notes (Signed)
[facility] OFFICE PROGRESS NOTE  Patient Care Team: Alroy Dust, L.Marlou Sa, MD as PCP - General (Family Medicine) Leta Baptist, MD as Consulting Physician (Otolaryngology)  ASSESSMENT & PLAN:  Follicular lymphoma grade iiia, lymph nodes of head, face, and neck ([place]) She has no signs or symptoms to suggest cancer recurrence Her last CT imaging showed no evidence of disease The patient is educated to watch out for signs and symptoms of cancer recurrence such as palpable lymphadenopathy, unexplained weight loss or night sweats I emphasized the importance of annual influenza vaccination I will see her back in a year for further follow-up There is no benefit for routine surveillance imaging study  Essential hypertension The patient is on multiple different blood pressure medications Her blood pressure is high today likely due to anxiety I recommend close follow-up with her primary care doctor for medication adjustment  Orders Placed This Encounter  Procedures   CBC with Differential/Platelet    Standing Status:   Standing    Number of Occurrences:   22    Standing Expiration Date:   12/30/2021   Comprehensive metabolic panel    Standing Status:   Standing    Number of Occurrences:   33    Standing Expiration Date:   12/30/2021    All questions were answered. The patient knows to call the clinic with any problems, questions or concerns. The total time spent in the appointment was 20 minutes encounter with patients including review of chart and various tests results, discussions about plan of care and coordination of care plan   Heath Lark, MD 12/30/2020 3:41 PM  INTERVAL HISTORY: Please see below for problem oriented charting. She returns for further follow-up She has no palpable lymphadenopathy No recent infection, fever or chills  SUMMARY OF ONCOLOGIC HISTORY: Oncology History Overview Note  FLIPI score 0 (low risk)    Follicular lymphoma grade iiia, lymph nodes of  head, face, and neck ([place])  03/08/2016 Imaging   Ct neck showed right level IIa lymphadenopathy at site of palpable interest. Additionally, there are prominent right upper cervical and left supraclavicular lymph nodes. This may represent nodal metastasis or be related to a systemic inflammatory or lymphoproliferative process. No infectious/and process in the neck identified. Effacement of the right vallecula an asymmetric thickening of the right aryepiglottic fold, direct visualization is recommended to evaluate for mucosal lesion.    03/19/2016 Pathology Results   Accession: [id number] Histologic type: Follicular lymphoma, large cell type. Grade (if applicable): High grade, 3 A. Flow cytometry: B cell population with kappa light chain excess ([id number]) Immunohistochemical stains: CD20, CD3, CD10, BCL-2, BCL-6 and Ki-67 with appropriate controls. Touch preps/imprints: A mixture of small and large lymphoid cells. Comments: The sections show effacement of the lymph nodal architecture by numerous variably sized and ill defined atypical lymphoid follicles characterized by lack of polarity, attenuated or absent mantle zones and relatively homogeneous composition of small and large transformed and cleaved lymphoid cells. The number of large lymphoid cells varies in different follicles but generally average more than 15 cells / high power field . This is associated with scattered mitosis. No diffuse component is identified. To further evaluate this process, flow cytometric analysis was performed and shows a B cell population displaying expression of pan B cell antigens including CD20 associated with CD10 and kappa light chain excess. Immunohistochemical stains were performed and show that the lymphoid follicles are positive for CD20, CD10, BCL-6 and BCL-2. Ki 67 shows variable increased expression ranging for 10%  to >50% in many of the follicles. The atypical lymphoid follicles are surrounded by an abundant  population of T cells as primarily seen with CD3. 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 (BNS:kh 03-23-16)    03/19/2016 Procedure   Direct laryngoscopy was negative for abnormalities. Biopsies are taken from right neck LN    04/12/2016 PET scan   PET scan showed abnormal right station 2A lymph node measuring 1.1 cm in length with maximum SUV of 6.1, Deauville scale 4. There is some symmetric and probably physiologic activity along the lingual tonsils. Spleen normal, and no other adenopathy identified    05/11/2016 - 06/22/2016 Chemotherapy   She received R-CHOP chemo x 3    07/13/2016 PET scan   Interval resolution of hypermetabolic right cervical level 2A lymph node. No evidence of metabolically active lymphoma.    03/29/2018 Imaging   1. No findings to suggest residual or recurrent lymphoma in the chest, abdomen, or pelvis. 2. Gastric antral wall thickening, at least partially felt to be due to underdistention. Correlate with symptoms of gastritis. Consider endoscopy depending on clinical symptoms. Of note, the appearance is grossly similar to 2014. 3.  Possible constipation. 4. Aortic atherosclerosis (ICD10-I70.0) and emphysema (ICD10-J43.9).      REVIEW OF SYSTEMS:   Constitutional: Denies fevers, chills or abnormal weight loss Eyes: Denies blurriness of vision Ears, nose, mouth, throat, and face: Denies mucositis or sore throat Respiratory: Denies cough, dyspnea or wheezes Cardiovascular: Denies palpitation, chest discomfort or lower extremity swelling Gastrointestinal:  Denies nausea, heartburn or change in bowel habits Skin: Denies abnormal skin rashes Lymphatics: Denies new lymphadenopathy or easy bruising Neurological:Denies numbness, tingling or new weaknesses Behavioral/Psych: Mood is stable, no new changes  All other systems were reviewed with the patient and are negative.  I have reviewed the past medical history, past surgical history,  social history and family history with the patient and they are unchanged from previous note.  ALLERGIES:  is allergic to lisinopril and penicillins.  MEDICATIONS:  Current Outpatient Medications  Medication Sig Dispense Refill   acetaminophen (TYLENOL) 500 MG tablet Take 500 mg by mouth every 6 (six) hours as needed for moderate pain.     amLODipine (NORVASC) 10 MG tablet Take 1 tablet (10 mg total) daily by mouth. 30 tablet 9   B Complex Vitamins (B-COMPLEX/B-12) TABS Take 1 tablet by mouth daily.     chlorthalidone (HYGROTON) 25 MG tablet Take 25 mg by mouth daily.     Cholecalciferol (VITAMIN D3) 5000 units CAPS Take 10,000 Units by mouth daily.      diclofenac (VOLTAREN) 50 MG EC tablet Take 50 mg by mouth 2 (two) times daily as needed.     escitalopram (LEXAPRO) 10 MG tablet Take 10 mg by mouth daily as needed (anxiety).   6   famotidine (PEPCID) 40 MG tablet Take 40 mg by mouth 2 (two) times daily.      fluticasone furoate-vilanterol (BREO ELLIPTA) 200-25 MCG/INH AEPB Inhale 1 puff into the lungs daily. 60 each 5   HYDROcodone-acetaminophen (NORCO/VICODIN) 5-325 MG tablet Take 1 tablet by mouth every 4 (four) hours as needed.     hydrOXYzine (ATARAX/VISTARIL) 25 MG tablet Take 25-50 mg by mouth every 8 (eight) hours as needed for anxiety.   3   ibuprofen (ADVIL) 200 MG tablet Take 200 mg by mouth every 6 (six) hours as needed for moderate pain.     lidocaine (XYLOCAINE) 2 % solution Swallow 15 mL every 3  hours as needed for upper abdominal pain. 100 mL 0   LORazepam (ATIVAN) 0.5 MG tablet Take 0.5 mg by mouth 2 (two) times daily as needed.     Multiple Vitamin (MULTIVITAMIN WITH MINERALS) TABS tablet Take 1 tablet by mouth daily.     Multiple Vitamins-Minerals (MULTIVITAMIN WITH MINERALS) tablet Take 1 tablet by mouth daily.     omeprazole (PRILOSEC) 40 MG capsule Take 40 mg by mouth 2 (two) times a day.     polyethylene glycol (MIRALAX / GLYCOLAX) packet Take 17 g by mouth daily as  needed for moderate constipation.      rosuvastatin (CRESTOR) 5 MG tablet Take 5 mg by mouth daily.     sucralfate (CARAFATE) 1 g tablet Take 1 tablet (1 g total) by mouth 4 (four) times daily. 120 tablet 0   telmisartan (MICARDIS) 40 MG tablet Take 40 mg by mouth daily.     zolpidem (AMBIEN) 10 MG tablet Take 5 mg by mouth at bedtime.   4   Current Facility-Administered Medications  Medication Dose Route Frequency Provider Last Rate Last Admin   0.9 %  sodium chloride infusion  500 mL Intravenous Once Doran Stabler, MD        PHYSICAL EXAMINATION: ECOG PERFORMANCE STATUS: 0 - Asymptomatic  Vitals:   12/30/20 0858  BP: (!) 172/83  Pulse: 65  Resp: 18  Temp: (!) 97.3 F (36.3 C)  SpO2: 99%   Filed Weights   12/30/20 0858  Weight: 143 lb 6.4 oz (65 kg)    GENERAL:alert, no distress and comfortable SKIN: skin color, texture, turgor are normal, no rashes or significant lesions EYES: normal, Conjunctiva are pink and non-injected, sclera clear OROPHARYNX:no exudate, no erythema and lips, buccal mucosa, and tongue normal  NECK: supple, thyroid normal size, non-tender, without nodularity LYMPH:  no palpable lymphadenopathy in the cervical, axillary or inguinal LUNGS: clear to auscultation and percussion with normal breathing effort HEART: regular rate & rhythm and no murmurs and no lower extremity edema ABDOMEN:abdomen soft, non-tender and normal bowel sounds Musculoskeletal:no cyanosis of digits and no clubbing  NEURO: alert & oriented x 3 with fluent speech, no focal motor/sensory deficits  LABORATORY DATA:  I have reviewed the data as listed    Component Value Date/Time   NA 139 12/30/2020 0836   NA 137 04/22/2017 1407   K 3.9 12/30/2020 0836   K 5.0 04/22/2017 1407   CL 100 12/30/2020 0836   CO2 30 12/30/2020 0836   CO2 24 04/22/2017 1407   GLUCOSE 76 12/30/2020 0836   GLUCOSE 77 04/22/2017 1407   BUN 9 12/30/2020 0836   BUN 21.6 04/22/2017 1407   CREATININE  0.62 12/30/2020 0836   CREATININE 0.8 04/22/2017 1407   CALCIUM 9.3 12/30/2020 0836   CALCIUM 9.5 04/22/2017 1407   PROT 7.2 12/30/2020 0836   PROT 7.1 04/22/2017 1407   ALBUMIN 3.9 12/30/2020 0836   ALBUMIN 3.9 04/22/2017 1407   AST 16 12/30/2020 0836   AST 18 04/22/2017 1407   ALT 11 12/30/2020 0836   ALT 16 04/22/2017 1407   ALKPHOS 81 12/30/2020 0836   ALKPHOS 91 04/22/2017 1407   BILITOT 0.4 12/30/2020 0836   BILITOT 0.55 04/22/2017 1407   GFRNONAA >60 12/30/2020 0836   GFRAA >60 12/31/2019 0938    No results found for: SPEP, UPEP  Lab Results  Component Value Date   WBC 5.9 12/30/2020   NEUTROABS 3.7 12/30/2020   HGB 13.9 12/30/2020  HCT 41.3 12/30/2020   MCV 91.4 12/30/2020   PLT 213 12/30/2020      Chemistry      Component Value Date/Time   NA 139 12/30/2020 0836   NA 137 04/22/2017 1407   K 3.9 12/30/2020 0836   K 5.0 04/22/2017 1407   CL 100 12/30/2020 0836   CO2 30 12/30/2020 0836   CO2 24 04/22/2017 1407   BUN 9 12/30/2020 0836   BUN 21.6 04/22/2017 1407   CREATININE 0.62 12/30/2020 0836   CREATININE 0.8 04/22/2017 1407      Component Value Date/Time   CALCIUM 9.3 12/30/2020 0836   CALCIUM 9.5 04/22/2017 1407   ALKPHOS 81 12/30/2020 0836   ALKPHOS 91 04/22/2017 1407   AST 16 12/30/2020 0836   AST 18 04/22/2017 1407   ALT 11 12/30/2020 0836   ALT 16 04/22/2017 1407   BILITOT 0.4 12/30/2020 0836   BILITOT 0.55 04/22/2017 1407

## 2020-12-31 ENCOUNTER — Telehealth: Payer: Self-pay | Admitting: Hematology and Oncology

## 2020-12-31 NOTE — Telephone Encounter (Signed)
Scheduled appointment per 07/19 sch msg. Patient is aware.

## 2021-04-15 ENCOUNTER — Ambulatory Visit
Admission: RE | Admit: 2021-04-15 | Discharge: 2021-04-15 | Disposition: A | Discharge status: Home / Self Care | Payer: 59 | Source: Ambulatory Visit | Attending: Family Medicine | Admitting: Family Medicine

## 2021-04-15 ENCOUNTER — Other Ambulatory Visit: Payer: Self-pay | Admitting: Family Medicine

## 2021-04-15 DIAGNOSIS — J449 Chronic obstructive pulmonary disease, unspecified: Secondary | ICD-10-CM

## 2021-10-22 ENCOUNTER — Inpatient Hospital Stay: Payer: Commercial Managed Care - HMO | Attending: Hematology and Oncology

## 2021-10-22 ENCOUNTER — Other Ambulatory Visit: Payer: Self-pay

## 2021-10-22 ENCOUNTER — Encounter: Payer: Self-pay | Admitting: Hematology and Oncology

## 2021-10-22 ENCOUNTER — Inpatient Hospital Stay (HOSPITAL_BASED_OUTPATIENT_CLINIC_OR_DEPARTMENT_OTHER): Payer: Commercial Managed Care - HMO | Admitting: Hematology and Oncology

## 2021-10-22 DIAGNOSIS — L0212 Furuncle of neck: Secondary | ICD-10-CM | POA: Insufficient documentation

## 2021-10-22 DIAGNOSIS — Z79899 Other long term (current) drug therapy: Secondary | ICD-10-CM | POA: Insufficient documentation

## 2021-10-22 DIAGNOSIS — Z87891 Personal history of nicotine dependence: Secondary | ICD-10-CM | POA: Insufficient documentation

## 2021-10-22 DIAGNOSIS — C8231 Follicular lymphoma grade IIIa, lymph nodes of head, face, and neck: Secondary | ICD-10-CM

## 2021-10-22 LAB — CBC WITH DIFFERENTIAL/PLATELET
Abs Immature Granulocytes: 0.02 10*3/uL (ref 0.00–0.07)
Basophils Absolute: 0 10*3/uL (ref 0.0–0.1)
Basophils Relative: 0 %
Eosinophils Absolute: 0.2 10*3/uL (ref 0.0–0.5)
Eosinophils Relative: 2 %
HCT: 37.7 % (ref 36.0–46.0)
Hemoglobin: 12.8 g/dL (ref 12.0–15.0)
Immature Granulocytes: 0 %
Lymphocytes Relative: 29 %
Lymphs Abs: 1.9 10*3/uL (ref 0.7–4.0)
MCH: 31.2 pg (ref 26.0–34.0)
MCHC: 34 g/dL (ref 30.0–36.0)
MCV: 92 fL (ref 80.0–100.0)
Monocytes Absolute: 0.3 10*3/uL (ref 0.1–1.0)
Monocytes Relative: 5 %
Neutro Abs: 4.2 10*3/uL (ref 1.7–7.7)
Neutrophils Relative %: 64 %
Platelets: 271 10*3/uL (ref 150–400)
RBC: 4.1 MIL/uL (ref 3.87–5.11)
RDW: 12.7 % (ref 11.5–15.5)
WBC: 6.7 10*3/uL (ref 4.0–10.5)
nRBC: 0 % (ref 0.0–0.2)

## 2021-10-22 LAB — COMPREHENSIVE METABOLIC PANEL
ALT: 11 U/L (ref 0–44)
AST: 12 U/L — ABNORMAL LOW (ref 15–41)
Albumin: 4.3 g/dL (ref 3.5–5.0)
Alkaline Phosphatase: 65 U/L (ref 38–126)
Anion gap: 6 (ref 5–15)
BUN: 16 mg/dL (ref 8–23)
CO2: 30 mmol/L (ref 22–32)
Calcium: 9.3 mg/dL (ref 8.9–10.3)
Chloride: 99 mmol/L (ref 98–111)
Creatinine, Ser: 0.91 mg/dL (ref 0.44–1.00)
GFR, Estimated: >60 mL/min (ref >60)
Glucose, Bld: 100 mg/dL — ABNORMAL HIGH (ref 70–99)
Potassium: 3.8 mmol/L (ref 3.5–5.1)
Sodium: 135 mmol/L (ref 135–145)
Total Bilirubin: 0.2 mg/dL — ABNORMAL LOW (ref 0.3–1.2)
Total Protein: 7.1 g/dL (ref 6.5–8.1)

## 2021-10-22 NOTE — Assessment & Plan Note (Signed)
She had skin infection resemble a boil on the side of her neck ?Her primary care doctor has lanced the area ?On further examination today, it is still draining pus ?I drained the boil a little further until it is almost flat against the skin ?Clean dressing is placed over the top ?I do not recommend the patient to putting any ointment over the wound ?She will continue antibiotics as prescribed ?I anticipate it will resolve in time ?

## 2021-10-22 NOTE — Progress Notes (Signed)
[facility] ?OFFICE PROGRESS NOTE ? ?Patient Care Team: ?Alroy Dust, L.Marlou Sa, MD as PCP - General (Family Medicine) ?Leta Baptist, MD as Consulting Physician (Otolaryngology) ? ?ASSESSMENT & PLAN:  ?Follicular lymphoma grade iiia, lymph nodes of head, face, and neck ([place]) ?Clinically, she have no signs or symptoms to suggest cancer recurrence ?She is more than 5 years out from her disease ?She does not need long-term follow-up ? ?Boil of neck ?She had skin infection resemble a boil on the side of her neck ?Her primary care doctor has lanced the area ?On further examination today, it is still draining pus ?I drained the boil a little further until it is almost flat against the skin ?Clean dressing is placed over the top ?I do not recommend the patient to putting any ointment over the wound ?She will continue antibiotics as prescribed ?I anticipate it will resolve in time ? ?No orders of the defined types were placed in this encounter. ? ? ?All questions were answered. The patient knows to call the clinic with any problems, questions or concerns. ?The total time spent in the appointment was 20 minutes encounter with patients including review of chart and various tests results, discussions about plan of care and coordination of care plan ?  ?Heath Lark, MD ?10/22/2021 1:34 PM ? ?INTERVAL HISTORY: ?Please see below for problem oriented charting. ?she returns for surveillance follow-up for history of lymphoma ?She had recent skin infection that was drained by her PCP yesterday ?She is started on antibiotics ?No other new skin lumps or lymphadenopathy ?The patient has quit smoking ? ?REVIEW OF SYSTEMS:   ?Constitutional: Denies fevers, chills or abnormal weight loss ?Eyes: Denies blurriness of vision ?Ears, nose, mouth, throat, and face: Denies mucositis or sore throat ?Respiratory: Denies cough, dyspnea or wheezes ?Cardiovascular: Denies palpitation, chest discomfort or lower extremity swelling ?Gastrointestinal:   Denies nausea, heartburn or change in bowel habits ?Lymphatics: Denies new lymphadenopathy or easy bruising ?Neurological:Denies numbness, tingling or new weaknesses ?Behavioral/Psych: Mood is stable, no new changes  ?All other systems were reviewed with the patient and are negative. ? ?I have reviewed the past medical history, past surgical history, social history and family history with the patient and they are unchanged from previous note. ? ?ALLERGIES:  is allergic to lisinopril and penicillins. ? ?MEDICATIONS:  ?Current Outpatient Medications  ?Medication Sig Dispense Refill  ? ALBUTEROL SULFATE HFA IN Inhale 2 Inhalers into the lungs every 4 (four) hours as needed. 2 puffs prn    ? BUPROPION HCL ER, SR, PO Take 150 mg by mouth daily.    ? clobetasol cream (TEMOVATE) 4.62 % Apply 1 application. topically 2 (two) times daily as needed.    ? esomeprazole (NEXIUM) 40 MG capsule Take 40 mg by mouth in the morning.    ? LORazepam (ATIVAN) 0.5 MG tablet Take 0.5 mg by mouth daily as needed for anxiety.    ? potassium chloride (KLOR-CON M) 10 MEQ tablet Take 10 mEq by mouth every other day.    ? sulfamethoxazole-trimethoprim (BACTRIM DS) 800-160 MG tablet 1 tablet    ? B Complex Vitamins (B-COMPLEX/B-12) TABS Take 1 tablet by mouth daily.    ? diclofenac (VOLTAREN) 50 MG EC tablet Take 50 mg by mouth 2 (two) times daily as needed.    ? Multiple Vitamin (MULTIVITAMIN WITH MINERALS) TABS tablet Take 1 tablet by mouth daily.    ? rosuvastatin (CRESTOR) 5 MG tablet Take 10 mg by mouth daily. 10 mg 3 X week    ?  telmisartan (MICARDIS) 40 MG tablet Take 40 mg by mouth daily.    ? zolpidem (AMBIEN) 10 MG tablet Take 5 mg by mouth at bedtime.   4  ? ?Current Facility-Administered Medications  ?Medication Dose Route Frequency Provider Last Rate Last Admin  ? 0.9 %  sodium chloride infusion  500 mL Intravenous Once Doran Stabler, MD      ? ? ?SUMMARY OF ONCOLOGIC HISTORY: ?Oncology History Overview Note  ?FLIPI score 0  (low risk) ? ?  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 Ct neck showed right level IIa lymphadenopathy at site of palpable interest. Additionally, there are prominent right upper cervical and left ?supraclavicular lymph nodes. This may represent nodal metastasis or be related to a systemic inflammatory or lymphoproliferative process. No infectious/and process in the neck identified. Effacement of the right vallecula an asymmetric thickening of the right aryepiglottic fold, direct visualization is recommended to evaluate for mucosal lesion. ? ?  ?03/19/2016 Pathology Results  ? Accession: [id number] Histologic type: Follicular lymphoma, large cell type. ?Grade (if applicable): High grade, 3 A. ?Flow cytometry: B cell population with kappa light chain excess ([id number]) ?Immunohistochemical stains: CD20, CD3, CD10, BCL-2, BCL-6 and Ki-67 with appropriate controls. ?Touch preps/imprints: A mixture of small and large lymphoid cells. ?Comments: The sections show effacement of the lymph nodal architecture by numerous variably sized and ill defined atypical lymphoid follicles characterized by lack of polarity, attenuated or absent mantle zones and relatively homogeneous composition of small and large transformed and cleaved lymphoid cells. The number of large lymphoid cells varies in different follicles but generally average more than 15 cells / high power field . This is associated with scattered mitosis. No diffuse component is identified. To further evaluate this process, flow cytometric analysis was ?performed and shows a B cell population displaying expression of pan B cell antigens including CD20 associated with CD10 and kappa light chain excess. Immunohistochemical stains were performed and show that the lymphoid follicles are positive for CD20, CD10, BCL-6 and BCL-2. Ki 67 shows variable increased expression ranging for 10% to >50% in many of the follicles.  The atypical lymphoid follicles are surrounded by an abundant population of T cells as primarily seen with CD3. 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 (BNS:kh 03-23-16) ? ?  ?03/19/2016 Procedure  ? Direct laryngoscopy was negative for abnormalities. Biopsies are taken from right neck LN ? ?  ?04/12/2016 PET scan  ? PET scan showed abnormal right station 2A lymph node measuring 1.1 cm in length with maximum SUV of 6.1, Deauville scale 4. There is some symmetric and probably physiologic activity along the lingual tonsils. Spleen normal, and no other adenopathy identified ? ?  ?05/11/2016 - 06/22/2016 Chemotherapy  ? She received R-CHOP chemo x 3 ? ?  ?07/13/2016 PET scan  ? Interval resolution of hypermetabolic right cervical level 2A lymph node. No evidence of metabolically active lymphoma. ? ?  ?03/29/2018 Imaging  ? 1. No findings to suggest residual or recurrent lymphoma in the chest, abdomen, or pelvis. ?2. Gastric antral wall thickening, at least partially felt to be due to underdistention. Correlate with symptoms of gastritis. Consider endoscopy depending on clinical symptoms. Of note, the appearance is ?grossly similar to 2014. ?3.  Possible constipation. ?4. Aortic atherosclerosis (ICD10-I70.0) and emphysema (ICD10-J43.9). ? ?  ? ? ?PHYSICAL EXAMINATION: ?ECOG PERFORMANCE STATUS: 1 - Symptomatic but completely ambulatory ? ?Vitals:  ? 10/22/21 1215  ?  BP: 127/72  ?Pulse: 76  ?Resp: 18  ?Temp: 97.7 ?F (36.5 ?C)  ?SpO2: 100%  ? ?Filed Weights  ? 10/22/21 1215  ?Weight: 147 lb 8 oz (66.9 kg)  ? ? ?GENERAL:alert, no distress and comfortable ?SKIN: She has an infected boil on the left side of her neck.  It is still draining pus.  I was able to drain it further and today it is almost flat.  New dressing changes placed over it ?NEURO: alert & oriented x 3 with fluent speech, no focal motor/sensory deficits ? ?LABORATORY DATA:  ?I have reviewed the data as listed ?   ?Component Value Date/Time   ? NA 135 10/22/2021 1157  ? NA 137 04/22/2017 1407  ? K 3.8 10/22/2021 1157  ? K 5.0 04/22/2017 1407  ? CL 99 10/22/2021 1157  ? CO2 30 10/22/2021 1157  ? CO2 24 04/22/2017 1407  ? GLUCOSE 100 (H) 05/11/

## 2021-10-22 NOTE — Assessment & Plan Note (Signed)
Clinically, she have no signs or symptoms to suggest cancer recurrence ?She is more than 5 years out from her disease ?She does not need long-term follow-up ?

## 2021-10-27 ENCOUNTER — Other Ambulatory Visit: Payer: Self-pay | Admitting: Family Medicine

## 2021-10-27 DIAGNOSIS — Z1231 Encounter for screening mammogram for malignant neoplasm of breast: Secondary | ICD-10-CM

## 2021-11-04 ENCOUNTER — Ambulatory Visit
Admission: RE | Admit: 2021-11-04 | Discharge: 2021-11-04 | Disposition: A | Discharge status: Home / Self Care | Payer: Commercial Managed Care - HMO | Source: Ambulatory Visit | Attending: Family Medicine | Admitting: Family Medicine

## 2021-11-04 DIAGNOSIS — Z1231 Encounter for screening mammogram for malignant neoplasm of breast: Secondary | ICD-10-CM

## 2021-12-09 ENCOUNTER — Other Ambulatory Visit: Payer: Self-pay | Admitting: Family Medicine

## 2021-12-09 ENCOUNTER — Ambulatory Visit
Admission: RE | Admit: 2021-12-09 | Discharge: 2021-12-09 | Disposition: A | Discharge status: Home / Self Care | Payer: Commercial Managed Care - HMO | Source: Ambulatory Visit | Attending: Family Medicine | Admitting: Family Medicine

## 2021-12-09 DIAGNOSIS — R0600 Dyspnea, unspecified: Secondary | ICD-10-CM

## 2021-12-09 DIAGNOSIS — R109 Unspecified abdominal pain: Secondary | ICD-10-CM

## 2021-12-17 ENCOUNTER — Other Ambulatory Visit: Payer: Self-pay | Admitting: Family Medicine

## 2021-12-17 DIAGNOSIS — Z87891 Personal history of nicotine dependence: Secondary | ICD-10-CM

## 2021-12-17 DIAGNOSIS — R0600 Dyspnea, unspecified: Secondary | ICD-10-CM

## 2021-12-17 DIAGNOSIS — J449 Chronic obstructive pulmonary disease, unspecified: Secondary | ICD-10-CM

## 2021-12-21 ENCOUNTER — Other Ambulatory Visit: Payer: Self-pay | Admitting: Family Medicine

## 2021-12-21 DIAGNOSIS — K30 Functional dyspepsia: Secondary | ICD-10-CM

## 2021-12-28 ENCOUNTER — Ambulatory Visit
Admission: RE | Admit: 2021-12-28 | Discharge: 2021-12-28 | Disposition: A | Discharge status: Home / Self Care | Payer: Commercial Managed Care - HMO | Source: Ambulatory Visit | Attending: Family Medicine | Admitting: Family Medicine

## 2021-12-28 DIAGNOSIS — J449 Chronic obstructive pulmonary disease, unspecified: Secondary | ICD-10-CM

## 2021-12-28 DIAGNOSIS — R0600 Dyspnea, unspecified: Secondary | ICD-10-CM

## 2021-12-28 DIAGNOSIS — Z87891 Personal history of nicotine dependence: Secondary | ICD-10-CM

## 2021-12-30 ENCOUNTER — Telehealth: Payer: Self-pay | Admitting: *Deleted

## 2021-12-30 ENCOUNTER — Encounter: Payer: Self-pay | Admitting: Physician Assistant

## 2021-12-30 ENCOUNTER — Other Ambulatory Visit (INDEPENDENT_AMBULATORY_CARE_PROVIDER_SITE_OTHER): Payer: Commercial Managed Care - HMO

## 2021-12-30 ENCOUNTER — Ambulatory Visit (INDEPENDENT_AMBULATORY_CARE_PROVIDER_SITE_OTHER): Payer: Commercial Managed Care - HMO | Admitting: Physician Assistant

## 2021-12-30 VITALS — BP 148/78 | HR 73 | Ht 61.0 in | Wt 150.0 lb

## 2021-12-30 DIAGNOSIS — R5383 Other fatigue: Secondary | ICD-10-CM

## 2021-12-30 DIAGNOSIS — K2270 Barrett's esophagus without dysplasia: Secondary | ICD-10-CM | POA: Diagnosis not present

## 2021-12-30 DIAGNOSIS — R1013 Epigastric pain: Secondary | ICD-10-CM

## 2021-12-30 DIAGNOSIS — Z860101 Personal history of adenomatous and serrated colon polyps: Secondary | ICD-10-CM

## 2021-12-30 DIAGNOSIS — K219 Gastro-esophageal reflux disease without esophagitis: Secondary | ICD-10-CM | POA: Diagnosis not present

## 2021-12-30 DIAGNOSIS — R0602 Shortness of breath: Secondary | ICD-10-CM

## 2021-12-30 DIAGNOSIS — K921 Melena: Secondary | ICD-10-CM

## 2021-12-30 DIAGNOSIS — Z86010 Personal history of colonic polyps: Secondary | ICD-10-CM | POA: Diagnosis not present

## 2021-12-30 LAB — CBC WITH DIFFERENTIAL/PLATELET
Basophils Absolute: 0 10*3/uL (ref 0.0–0.1)
Basophils Relative: 0.2 % (ref 0.0–3.0)
Eosinophils Absolute: 0.1 10*3/uL (ref 0.0–0.7)
Eosinophils Relative: 1.3 % (ref 0.0–5.0)
HCT: 37 % (ref 36.0–46.0)
Hemoglobin: 12.6 g/dL (ref 12.0–15.0)
Lymphocytes Relative: 23.5 % (ref 12.0–46.0)
Lymphs Abs: 1.8 10*3/uL (ref 0.7–4.0)
MCHC: 34.1 g/dL (ref 30.0–36.0)
MCV: 89.1 fl (ref 78.0–100.0)
Monocytes Absolute: 0.6 10*3/uL (ref 0.1–1.0)
Monocytes Relative: 7.6 % (ref 3.0–12.0)
Neutro Abs: 5.3 10*3/uL (ref 1.4–7.7)
Neutrophils Relative %: 67.4 % (ref 43.0–77.0)
Platelets: 247 10*3/uL (ref 150.0–400.0)
RBC: 4.16 Mil/uL (ref 3.87–5.11)
RDW: 13.2 % (ref 11.5–15.5)
WBC: 7.8 10*3/uL (ref 4.0–10.5)

## 2021-12-30 LAB — COMPREHENSIVE METABOLIC PANEL
ALT: 11 U/L (ref 0–35)
AST: 14 U/L (ref 0–37)
Albumin: 4.5 g/dL (ref 3.5–5.2)
Alkaline Phosphatase: 69 U/L (ref 39–117)
BUN: 34 mg/dL — ABNORMAL HIGH (ref 6–23)
CO2: 24 mEq/L (ref 19–32)
Calcium: 9.1 mg/dL (ref 8.4–10.5)
Chloride: 90 mEq/L — ABNORMAL LOW (ref 96–112)
Creatinine, Ser: 0.8 mg/dL (ref 0.40–1.20)
GFR: 78.93 mL/min (ref >60.00)
Glucose, Bld: 83 mg/dL (ref 70–99)
Potassium: 3.7 mEq/L (ref 3.5–5.1)
Sodium: 126 mEq/L — ABNORMAL LOW (ref 135–145)
Total Bilirubin: 0.4 mg/dL (ref 0.2–1.2)
Total Protein: 7 g/dL (ref 6.0–8.3)

## 2021-12-30 LAB — IBC + FERRITIN
Ferritin: 45.2 ng/mL (ref 10.0–291.0)
Iron: 43 ug/dL (ref 42–145)
Saturation Ratios: 10 % — ABNORMAL LOW (ref 20.0–50.0)
TIBC: 428.4 ug/dL (ref 250.0–450.0)
Transferrin: 306 mg/dL (ref 212.0–360.0)

## 2021-12-30 NOTE — Progress Notes (Signed)
Chief Complaint:Constipation and "black stools"  HPI:    Emily Santiago is a 61 year old Caucasian female with a past medical history of anxiety, CKD, COPD, GERD, Barrett's, follicular lymphoma, and multiple others listed below, known to Dr. Loletha Carrow, who presents to clinic today with a complaint of constipation and "black stools".    05/20/2017 colonoscopy with melanosis of the colon, one 12 mm polyp in the mid ascending colon, one 4 mm polyp in the mid sigmoid colon and diverticulosis in the left and right colon.  Pathology showed tubular adenomas and repeat recommended in 3 years.    01/21/2020 patient seen in clinic by Dr. Loletha Carrow.  At that time discussed an upper endoscopy for Barrett's surveillance in June 2020 with endoscopic findings similar to December 2019 exam with a short tongue of salmon-colored mucosa, tortuous distal esophagus and several clean-based gastric ulcers.  She was noted to be H. pylori positive from prior evaluation was again advised to stop BC and Goody powders as well as smoking.  Biopsies showed no Barrett's.  She was recommended to have repeat in 3 years.  At that time had complaints of dysphagia.  She was diagnosed with chronic epigastric discomfort, aspirin induced gastric ulcers and several months of dysphagia.  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      10/22/2021 CBC with a normal hemoglobin at 12.8.  CMP with elevated glucose at 100 and otherwise normal.    12/10/2021 abdominal x-ray with nonobstructive nonspecific bowel gas pattern done for abdominal pain.    12/29/2021 CT of the chest for lung cancer screening showed a new part solid nodule of the right lower lobe measuring 8.5 mm in overall diameter suspicious.  Also some new solid pulmonary nodules in the right upper lobe and right middle lobe.  Patient was scheduled follow-up appointment.    Today, patient  presents to clinic and tells me that she has seen some black stools off-and-on over the past 2 to 3 months maybe 1 a week or not even that often.  In general she has had some increased shortness of breath feeling like even just sitting still it is hard to take a deep breath.  She has been following with her PCP about this who recently did a CT as above and is told her to come for follow-up.  She denies oxygen use.    Also describes reflux uncontrolled on Nexium 40 mg once daily.  She tells me that sometimes she takes it twice a day was really bad.  Does still occasionally use Goody powders.  She did stop smoking about a year ago.  Also some generalized abdominal pain.    Denies fever, chills or weight loss.     Past Medical History:  Diagnosis Date   Anxiety    Aortic atherosclerosis (HCC)    Arthritis    WRIST AND BACK   Atrophic vaginitis    CKD (chronic kidney disease), stage II    COPD (chronic obstructive pulmonary disease) (HCC)    COVID-19    Depression    Diverticulosis    Emphysema/COPD (HCC)    Gastric ulcer    GERD (gastroesophageal reflux disease)    History of Barrett's esophagus    History of head and neck cancer    Hx of colonic polyps    Hyperlipidemia    Hypertension    Insomnia    Lymphoma ([place])  Lymphoma   Melanosis coli    Osteopenia    Osteoporosis    Plantar fasciitis    Tobacco abuse    Tubular adenoma of colon    Vallecular mass    Varicose veins    Vitamin D deficiency     Past Surgical History:  Procedure Laterality Date   ABDOMINAL HYSTERECTOMY     APPENDECTOMY     BILATERAL SALPINGOOPHORECTOMY     CHOLECYSTECTOMY     ESOPHAGEAL MANOMETRY N/A 04/28/2016   Procedure: ESOPHAGEAL MANOMETRY (EM);  Surgeon: Mauri Pole, MD;  Location: [facility];  Service: Endoscopy;  Laterality: N/A;  dr. Loletha Carrow   KNEE ARTHROSCOPY     LYMPH NODE BIOPSY     TONSILLECTOMY     UPPER GASTROINTESTINAL ENDOSCOPY     WRIST SURGERY Right     Current  Outpatient Medications  Medication Sig Dispense Refill   ALBUTEROL SULFATE HFA IN Inhale 2 Inhalers into the lungs every 4 (four) hours as needed. 2 puffs prn     B Complex Vitamins (B-COMPLEX/B-12) TABS Take 1 tablet by mouth daily.     BUPROPION HCL ER, SR, PO Take 150 mg by mouth daily.     diclofenac (VOLTAREN) 50 MG EC tablet Take 50 mg by mouth 2 (two) times daily as needed.     LORazepam (ATIVAN) 0.5 MG tablet Take 0.5 mg by mouth daily as needed for anxiety.     Multiple Vitamin (MULTIVITAMIN WITH MINERALS) TABS tablet Take 1 tablet by mouth daily.     potassium chloride (KLOR-CON M) 10 MEQ tablet Take 10 mEq by mouth every other day.     rosuvastatin (CRESTOR) 5 MG tablet Take 10 mg by mouth daily. 10 mg 3 X week     telmisartan (MICARDIS) 40 MG tablet Take 40 mg by mouth daily.     zolpidem (AMBIEN) 10 MG tablet Take 5 mg by mouth at bedtime.   4   No current facility-administered medications for this visit.    Allergies as of 12/30/2021 - Review Complete 12/30/2021  Allergen Reaction Noted   Lisinopril Cough 01/25/2013   Penicillins Nausea Only and Rash 01/25/2013    Family History  Problem Relation Age of Onset   Heart disease Father    Colon cancer Father        dx in his 25's   Diabetes Father        metastatic   Colon polyps Father    Heart disease Maternal Grandmother    Breast cancer Maternal Grandmother    Prostate cancer Maternal Uncle    Brain cancer Brother    Liver cancer Brother    Bone cancer Brother    Prostate cancer Brother    Hyperlipidemia Mother    Colon polyps Mother    Breast cancer Cousin        x3   Stomach cancer Neg Hx    Pancreatic cancer Neg Hx    Esophageal cancer Neg Hx    Rectal cancer Neg Hx     Social History   Socioeconomic History   Marital status: Divorced    Spouse name: Not on file   Number of children: 0   Years of education: Not on file   Highest education level: Not on file  Occupational History   Occupation:  Engineer, manufacturing  Tobacco Use   Smoking status: Former    Years: 39.00    Types: Cigarettes    Quit date: 2022    Years  since quitting: 1.5   Smokeless tobacco: Never  Vaping Use   Vaping Use: Never used  Substance and Sexual Activity   Alcohol use: No   Drug use: No   Sexual activity: Not on file  Other Topics Concern   Not on file  Social History Narrative   Not on file   Social Determinants of Health   Financial Resource Strain: Not on file  Food Insecurity: Not on file  Transportation Needs: Not on file  Physical Activity: Not on file  Stress: Not on file  Social Connections: Not on file  Intimate Partner Violence: Not on file    Review of Systems:    Constitutional: No weight loss, fever or chills Skin: No rash  Cardiovascular: No chest pain Respiratory: +SOB Gastrointestinal: See HPI and otherwise negative Genitourinary: No dysuria  Neurological: No headache, dizziness or syncope Musculoskeletal: No new muscle or joint pain Hematologic: No ruising Psychiatric: No history of depression or anxiety   Physical Exam:  Vital signs: BP (!) 148/78   Pulse 73   Ht 5\' 1"  (1.549 m)   Wt 150 lb (68 kg)   SpO2 94%   BMI 28.34 kg/m    Constitutional:   Pleasant Caucasian female appears to be in NAD, Well developed, Well nourished, alert and cooperative Respiratory: Respirations even and unlabored. Lungs clear to auscultation bilaterally.   No wheezes, crackles, or rhonchi.  Cardiovascular: Normal S1, S2. No MRG. Regular rate and rhythm. No peripheral edema, cyanosis or pallor.  Gastrointestinal:  Soft, nondistended, moderate epigastric TTP with some involuntary guarding. Normal bowel sounds. No appreciable masses or hepatomegaly. Rectal:  Not performed.  Psychiatric: Oriented to person, place and time. Demonstrates good judgement and reason without abnormal affect or behaviors.  RELEVANT LABS AND IMAGING: CBC    Component Value Date/Time   WBC 6.7 10/22/2021 1157    RBC 4.10 10/22/2021 1157   HGB 12.8 10/22/2021 1157   HGB 13.4 04/22/2017 1407   HCT 37.7 10/22/2021 1157   HCT 40.5 04/22/2017 1407   PLT 271 10/22/2021 1157   PLT 219 04/22/2017 1407   MCV 92.0 10/22/2021 1157   MCV 95.1 04/22/2017 1407   MCH 31.2 10/22/2021 1157   MCHC 34.0 10/22/2021 1157   RDW 12.7 10/22/2021 1157   RDW 13.4 04/22/2017 1407   LYMPHSABS 1.9 10/22/2021 1157   LYMPHSABS 1.7 04/22/2017 1407   MONOABS 0.3 10/22/2021 1157   MONOABS 0.4 04/22/2017 1407   EOSABS 0.2 10/22/2021 1157   EOSABS 0.1 04/22/2017 1407   BASOSABS 0.0 10/22/2021 1157   BASOSABS 0.0 04/22/2017 1407    CMP     Component Value Date/Time   NA 135 10/22/2021 1157   NA 137 04/22/2017 1407   K 3.8 10/22/2021 1157   K 5.0 04/22/2017 1407   CL 99 10/22/2021 1157   CO2 30 10/22/2021 1157   CO2 24 04/22/2017 1407   GLUCOSE 100 (H) 10/22/2021 1157   GLUCOSE 77 04/22/2017 1407   BUN 16 10/22/2021 1157   BUN 21.6 04/22/2017 1407   CREATININE 0.91 10/22/2021 1157   CREATININE 0.8 04/22/2017 1407   CALCIUM 9.3 10/22/2021 1157   CALCIUM 9.5 04/22/2017 1407   PROT 7.1 10/22/2021 1157   PROT 7.1 04/22/2017 1407   ALBUMIN 4.3 10/22/2021 1157   ALBUMIN 3.9 04/22/2017 1407   AST 12 (L) 10/22/2021 1157   AST 18 04/22/2017 1407   ALT 11 10/22/2021 1157   ALT 16 04/22/2017 1407   ALKPHOS 65  10/22/2021 1157   ALKPHOS 91 04/22/2017 1407   BILITOT 0.2 (L) 10/22/2021 1157   BILITOT 0.55 04/22/2017 1407   GFRNONAA >60 10/22/2021 1157   GFRAA >60 12/31/2019 0938    Assessment: 1.  Epigastric pain and reflux: Continues regardless of Nexium 40 mg once daily, likely due to NSAID use 2.  History of Barrett's esophagus: Repeat EGD recommended now 3.  History of adenomatous polyps: Overdue for colonoscopy 4.  Shortness of breath 5.  Abnormal chest CT: New pulmonary nodule, likely related to above 6.  Fatigue: Likely with shortness of breath and question of anemia given black stools 7.  Black stools:  Off-and-on over the past 2 to 3 months, previous history of ulcers related to NSAID use; likely the same  Plan: 1.  Ordered labs to include a CBC, CMP and iron studies. 2.  Scheduled patient for a diagnostic EGD and surveillance colonoscopy in the [place] with Dr. Loletha Carrow.  Did provide the patient a detailed list of risks for the procedures and she agrees to proceed. Patient is appropriate for endoscopic procedure(s) in the ambulatory ([place]) setting.  3.  We will ask for pulmonary clearance given her recent shortness of breath and abnormal CT.  It sounds like she may need biopsies going forward. 4.  Told patient to use her Nexium 40 mg twice a day.  This is how it was prescribed.  Recommend 30 minutes before breakfast and again before dinner. 5.  Again advised against NSAID use including Goody and BC powders. 6.  Patient to follow in clinic per recommendations after above.  Emily Newer, PA-C [facility] 12/30/2021, 1:29 PM  Cc: Alroy Dust, L.Marlou Sa, MD

## 2021-12-30 NOTE — Telephone Encounter (Signed)
Ok I will contact her PCP

## 2021-12-30 NOTE — Telephone Encounter (Signed)
She was last seen the fall of 2021. Her PFTs were normal with the exception of findings consistent with asthma and air trapping. Please schedule appointment with myself next available for pre-operative assessment.

## 2021-12-30 NOTE — Telephone Encounter (Signed)
Please advise if the patient can have pulmonary clearance for a colonoscopy and endoscopy scheduled on 01/13/2022   Thank You Genella Mech CMA

## 2021-12-30 NOTE — Patient Instructions (Signed)
Use your Nexium 40 mg twice a day   We will obtain Pulmonary clearance before your procedure   Follow up per recommendations after procedure  Your provider has requested that you go to the basement level for lab work before leaving today. Press "B" on the elevator. The lab is located at the first door on the left as you exit the elevator.   You have been scheduled for an endoscopy and colonoscopy. Please follow the written instructions given to you at your visit today. Please pick up your prep supplies at the pharmacy within the next 1-3 days. If you use inhalers (even only as needed), please bring them with you on the day of your procedure.   Due to recent changes in healthcare laws, you may see the results of your imaging and laboratory studies on MyChart before your provider has had a chance to review them.  We understand that in some cases there may be results that are confusing or concerning to you. Not all laboratory results come back in the same time frame and the provider may be waiting for multiple results in order to interpret others.  Please give [place] 48 hours in order for your provider to thoroughly review all the results before contacting the office for clarification of your results.    If you are age 65 or older, your body mass index should be between 23-30. Your Body mass index is 28.34 kg/m. If this is out of the aforementioned range listed, please consider follow up with your Primary Care Provider.  If you are age 19 or younger, your body mass index should be between 19-25. Your Body mass index is 28.34 kg/m. If this is out of the aformentioned range listed, please consider follow up with your Primary Care Provider.   ________________________________________________________  The [place] [facility] providers would like to encourage you to use [facility] to communicate with providers for non-urgent requests or questions.  Due to long hold times on the telephone, sending your provider a  message by [facility] may be a faster and more efficient way to get a response.  Please allow 48 business hours for a response.  Please remember that this is for non-urgent requests.  _______________________________________________________   I appreciate the  opportunity to care for you  Thank You   Lovett Calender

## 2021-12-31 NOTE — Telephone Encounter (Signed)
Faxed letter today to Renee Ramus patients PCP for pulmonary clearance Faxed through Epic

## 2022-01-01 ENCOUNTER — Telehealth: Payer: Self-pay | Admitting: Gastroenterology

## 2022-01-01 ENCOUNTER — Ambulatory Visit: Payer: 59 | Admitting: Hematology and Oncology

## 2022-01-01 ENCOUNTER — Other Ambulatory Visit: Payer: 59

## 2022-01-01 NOTE — Telephone Encounter (Signed)
Pt states that she has questions about some of her labs: Saturation Ratio, Sodium, BUN Please advise

## 2022-01-01 NOTE — Telephone Encounter (Signed)
Pt was made aware of Earnie Larsson PA recommendations: Report sent to Pt PCP: Pt made aware Pt verbalized understanding with all questions answered.

## 2022-01-01 NOTE — Telephone Encounter (Signed)
Inbound call from patient inquiring about lab results. Please give patient a call back to advise.  Thank you

## 2022-01-05 ENCOUNTER — Telehealth: Payer: Self-pay | Admitting: Gastroenterology

## 2022-01-05 ENCOUNTER — Telehealth: Payer: Self-pay

## 2022-01-05 ENCOUNTER — Telehealth: Payer: Self-pay | Admitting: Student

## 2022-01-05 DIAGNOSIS — E871 Hypo-osmolality and hyponatremia: Secondary | ICD-10-CM

## 2022-01-05 NOTE — Telephone Encounter (Signed)
Inbound call from [place] at [place] stating patient has an upcoming appointment with Pulmonary ad needs to have the Surgical clearance form sent over. Please advise.  Thank you

## 2022-01-05 NOTE — Telephone Encounter (Signed)
Called and spoke with patient regarding Dr. Loletha Carrow' recommendations. Pt has been advised to come by the lab at our office tomorrow or Thursday between 7:30 am-5 pm. Pt is aware that we may have to cancel her 8/2 procedure dependent upon her lab results. Pt verbalized understanding and had no concerns at the end of the call.  Lab order in epic.

## 2022-01-05 NOTE — Telephone Encounter (Signed)
Emily Santiago Jul 21, 1960 [id number]  Dear Dr. Silas Flood:  We have scheduled the above named patient for an EGD and colonoscopy procedure. Our records show that she will need pulmonary clearance before being able to proceed with endoscopic procedures.   Please advise as to whether the patient may proceed with their procedure which is scheduled for 01/13/22.  Please route your response to Gerre Couch, RN or fax response to [phone].  Sincerely,   [facility]

## 2022-01-05 NOTE — Telephone Encounter (Signed)
Called Emily Santiago at [place] at [facility] to clarify. She stated that we needed to fax a pulmonary clearance request as pt has an appt with Pulmonary tomorrow. See alternate telephone encounter for pulmonary clearance.

## 2022-01-05 NOTE — Telephone Encounter (Signed)
[place],  This patient was seen in clinic by JLL Fabio Asa) last week and is scheduled for an EGD and colonoscopy with me in the [facility] on August 2.  BMP last week revealed significantly low sodium and chloride with elevated BUN.  Results reviewed by JLL and attributed to possible volume depletion.  However, this patient is on chlorthalidone, which seems a likely culprit for these lab abnormalities.  This needs to be rectified by her primary care physician prior to preparation for endoscopic procedures.(Which I noted in my addendum to the recent clinic note)  Please contact this patient today and have her come to our lab tomorrow or the following day for a BMP.  Indication is hyponatremia.  I will be out of the office starting tomorrow afternoon and returning the afternoon of 01/11/2022., so please be sure to check on the BMP results.  If resulted while I am out, then communicate with JLL about the results and further recommendations.(JLL here 7/25 and 7/26, off 7/27) Speak to DOD if needed.  - HD  _____________________   Anderson Malta,    Pls see above  - HD

## 2022-01-05 NOTE — Progress Notes (Signed)
____________________________________________________________  Attending physician addendum:  Thank you for sending this case to me. I have reviewed the entire note and agree with the plan for endoscopic testing.  She is scheduled to see her pulmonary physician 01/06/2022.  Note from that provider after her visit review indicates that PFTs last year indicated asthma.  In addition, she was significantly hyponatremic and hypochloremic with elevated BUN on labs 12/30/2021. She needs a BMP drawn this week and, if similar lab abnormalities persist, her procedures should be canceled until that issue is addressed and rectified by primary care.  Otherwise, it presents an increased risk of volume electrolyte derangement from bowel preparation as well potential cardiac and respiratory complications of sedation.   -I will send a message to that effect to my nurse today.  Wilfrid Lund, MD  ____________________________________________________________

## 2022-01-05 NOTE — Telephone Encounter (Signed)
Called patient to give updated arrival time for appointment on 8/2. Told patient to arrive at 0730 for their 0830 appointment

## 2022-01-06 ENCOUNTER — Ambulatory Visit (INDEPENDENT_AMBULATORY_CARE_PROVIDER_SITE_OTHER): Payer: Commercial Managed Care - HMO | Admitting: Pulmonary Disease

## 2022-01-06 ENCOUNTER — Encounter: Payer: Self-pay | Admitting: Pulmonary Disease

## 2022-01-06 VITALS — BP 126/74 | HR 67 | Temp 98.5°F | Ht 61.0 in | Wt 149.2 lb

## 2022-01-06 DIAGNOSIS — R911 Solitary pulmonary nodule: Secondary | ICD-10-CM

## 2022-01-06 NOTE — Patient Instructions (Addendum)
Nice to see you again  For the shortness of breath, I recommend using Anoro 1 puff once a day.  This has to medicines to open up your airways and help you breathe deeper.  Based on your breathing test in 2021, a medication like this should help.  Your breathing test in 2021 were otherwise normal.  We will walk you today to make sure your oxygen is not dropping.  Return to clinic in 3 months or sooner as needed with Dr. Silas Flood

## 2022-01-06 NOTE — Progress Notes (Incomplete)
Patient ID: Emily Santiago, female    DOB: 1960/11/26, 61 y.o.   MRN: [id number]  Chief Complaint  Patient presents with  . Follow-up    Pt is here due to sob and low oxygen levels noted. Pt has a hx of copd noted in chart. Pt states her sodium is low and issues noted with kidneys. CT scan last week and blood work done. Pt is on albuterol as needed    Referring provider: Alroy Dust, L.Marlou Sa, MD  HPI:   Emily Santiago is a 61 y.o. woman with PMH tobacco abuse, emphysema, HTN whom we are seeing in consultation at the request of Wilfrid Lund MD for evaluation of cough. Notes from referring provider reviewed.   History obtained per patient and mother at bedside who frequently interjects.  Patient notes cough is actually been present for many years.  She thinks cough is not worsened over the last 6 months or so.  In the past, she attributed to her smoking.  She reportedly has had many episodes of bronchitis. She has been told she has COPD based on her symptoms and smoking. Never had PFTs. Requires prednisone everythime she has bronchitis. At least once a year.  2 courses this calendar year already to date.   Cough is daily. Worse when eating and drinking. Gets choked on water. Endorses sensation of solids getting stuck in throat. Cough worse when lying supine. Cough is non-productive. Endorses occasional heartburn despite PPI and H2 blocker. Endorses refractory reflux. Reports she has a hiatal hernia. Had curse of abx with no improvement. Uses Breo and thinks helps cough mildly and gives a little more breath.  She is an every day smoker. 2 ppd for about 40 years. Interested in quitting but seems more pre-contemplative at the moment.   Prior imaging reviewed and interpreted as: CXR 10/2019 - hyperinflated, no infiltrate; CT chest 03/2018 - emphysema, clear lungs.   PMH: HTN, GERD, emphysema Surgical history: Endoscopy, hysterectomy, cholecystectomy Family History: CAD, COPD in father, COPD in  brother Social History: every day smoker, lives in [place] / Pulmonary Flowsheets:   ACT:      No data to display          MMRC:     No data to display          Epworth:      No data to display          Tests:   FENO:  No results found for: "NITRICOXIDE"  PFT:    Latest Ref Rng & Units 03/26/2020    2:56 PM  PFT Results  FVC-Pre L 2.21   FVC-Predicted Pre % 74   FVC-Post L 2.49   FVC-Predicted Post % 84   Pre FEV1/FVC % % 70   Post FEV1/FCV % % 72   FEV1-Pre L 1.55   FEV1-Predicted Pre % 67   FEV1-Post L 1.78   DLCO uncorrected ml/min/mmHg 14.63   DLCO UNC% % 80   DLCO corrected ml/min/mmHg 14.63   DLCO COR %Predicted % 80   DLVA Predicted % 81   TLC L 4.73   TLC % Predicted % 102   RV % Predicted % 136    WALK:      No data to display          Imaging: CT CHEST LUNG CA SCREEN LOW DOSE W/O CM  Result Date: 12/29/2021 CLINICAL DATA:  Current smoker with 102.5 pack-year history EXAM: CT CHEST WITHOUT CONTRAST  LOW-DOSE FOR LUNG CANCER SCREENING TECHNIQUE: Multidetector CT imaging of the chest was performed following the standard protocol without IV contrast. RADIATION DOSE REDUCTION: This exam was performed according to the departmental dose-optimization program which includes automated exposure control, adjustment of the mA and/or kV according to patient size and/or use of iterative reconstruction technique. COMPARISON:  Chest CT dated March 29, 2018; lung cancer screening CT dated September 15, 2015 FINDINGS: Cardiovascular: Normal heart size. No pericardial effusion. No visible coronary artery calcifications. Normal caliber thoracic aorta with mild calcified plaque. Mediastinum/Nodes: Small hiatal hernia. Thyroid is unremarkable. No pathologically enlarged lymph nodes seen in the chest. Lungs/Pleura: Central airways are patent. Mild centrilobular emphysema. No consolidation, pleural effusion or pneumothorax. Part solid nodule of the  right lower lobe measuring 8.5 mm in overall diameter with 4.7 mm solid component, new when compared with prior exams. Numerous small solid pulmonary of the posterior right upper lobe right middle lobe, new when compared with prior exams, largest measures 4 mm on image 160. Other previously seen nodules are stable. Upper Abdomen: Cholecystectomy clips.  No acute abnormality. Musculoskeletal: Mild compression deformity of the T12 vertebral body, unchanged when compared to 2019 prior. No aggressive appearing osseous lesions. IMPRESSION: 1. New part solid nodule of the right lower lobe measuring 8.5 mm in overall diameter with 4.7 mm solid component. Recommend follow-up chest CT further evaluation. Lung-RADS 4A, suspicious. Follow up low-dose chest CT without contrast in 3 months (please use the following order, CT CHEST LCS NODULE FOLLOW-UP W/O CM) is recommended. Alternatively, PET may be considered when there is a solid component 86mm or larger. 2. Numerous new solid pulmonary nodules are seen in the right upper lobe and right middle lobe, likely sequela of infection or aspiration. Recommend attention on follow-up. 3. Aortic Atherosclerosis (ICD10-I70.0) and Emphysema (ICD10-J43.9). Electronically Signed   By: Yetta Glassman M.D.   On: 12/29/2021 20:24   DG Abd 2 Views  Result Date: 12/10/2021 CLINICAL DATA:  Abdominal pain.  Skip EXAM: ABDOMEN - 2 VIEW COMPARISON:  None Available. FINDINGS: The bowel gas pattern is normal. There is no evidence of free air. There are phleboliths in the pelvis. Cholecystectomy clips are present. No suspicious calcifications identified. Osseous structures are within normal limits. IMPRESSION: Nonobstructive, nonspecific bowel gas pattern. Electronically Signed   By: Ronney Asters M.D.   On: 12/10/2021 22:34   DG Chest 2 View  Result Date: 12/10/2021 CLINICAL DATA:  Patient with shortness of breath. Upper abdominal pain. EXAM: CHEST - 2 VIEW COMPARISON:  Chest radiograph  04/15/2021 FINDINGS: The heart size and mediastinal contours are within normal limits. Both lungs are clear. The visualized skeletal structures are unremarkable. IMPRESSION: No active cardiopulmonary disease. Electronically Signed   By: Lovey Newcomer M.D.   On: 12/10/2021 16:38    Lab Results:  CBC    Component Value Date/Time   WBC 7.8 12/30/2021 1416   RBC 4.16 12/30/2021 1416   HGB 12.6 12/30/2021 1416   HGB 13.4 04/22/2017 1407   HCT 37.0 12/30/2021 1416   HCT 40.5 04/22/2017 1407   PLT 247.0 12/30/2021 1416   PLT 219 04/22/2017 1407   MCV 89.1 12/30/2021 1416   MCV 95.1 04/22/2017 1407   MCH 31.2 10/22/2021 1157   MCHC 34.1 12/30/2021 1416   RDW 13.2 12/30/2021 1416   RDW 13.4 04/22/2017 1407   LYMPHSABS 1.8 12/30/2021 1416   LYMPHSABS 1.7 04/22/2017 1407   MONOABS 0.6 12/30/2021 1416   MONOABS 0.4 04/22/2017 1407  EOSABS 0.1 12/30/2021 1416   EOSABS 0.1 04/22/2017 1407   BASOSABS 0.0 12/30/2021 1416   BASOSABS 0.0 04/22/2017 1407    BMET    Component Value Date/Time   NA 126 (L) 12/30/2021 1416   NA 137 04/22/2017 1407   K 3.7 12/30/2021 1416   K 5.0 04/22/2017 1407   CL 90 (L) 12/30/2021 1416   CO2 24 12/30/2021 1416   CO2 24 04/22/2017 1407   GLUCOSE 83 12/30/2021 1416   GLUCOSE 77 04/22/2017 1407   BUN 34 (H) 12/30/2021 1416   BUN 21.6 04/22/2017 1407   CREATININE 0.80 12/30/2021 1416   CREATININE 0.8 04/22/2017 1407   CALCIUM 9.1 12/30/2021 1416   CALCIUM 9.5 04/22/2017 1407   GFRNONAA >60 10/22/2021 1157   GFRAA >60 12/31/2019 0938    BNP    Component Value Date/Time   BNP 16.5 02/18/2017 1659    ProBNP No results found for: "PROBNP"  Specialty Problems       Pulmonary Problems   Sore throat    Allergies  Allergen Reactions  . Lisinopril Cough  . Penicillins Nausea Only and Rash    Stomach upset Has patient had a PCN reaction causing immediate rash, facial/tongue/throat swelling, SOB or lightheadedness with hypotension: No Has  patient had a PCN reaction causing severe rash involving mucus membranes or skin necrosis: Yes Has patient had a PCN reaction that required hospitalization: NO Has patient had a PCN reaction occurring within the last 10 years: Yes If all of the above answers are "NO", then may proceed with Cephalosporin use.      Immunization History  Administered Date(s) Administered  . Influenza Split 02/13/2020  . Influenza,inj,Quad PF,6+ Mos 06/01/2016, 03/15/2018, 04/15/2021  . Influenza,inj,quad, With Preservative 05/17/2013  . Moderna Sars-Covid-2 Vaccination 01/14/2020, 02/13/2020, 11/10/2020  . PNEUMOCOCCAL CONJUGATE-20 05/20/2021  . Pension scheme manager 12yrs & up 05/20/2021  . Zoster, Live 10/14/2021    Past Medical History:  Diagnosis Date  . Anxiety   . Aortic atherosclerosis ([place])   . Arthritis    WRIST AND BACK  . Atrophic vaginitis   . CKD (chronic kidney disease), stage II   . COPD (chronic obstructive pulmonary disease) ([place])   . COVID-19   . Depression   . Diverticulosis   . Emphysema/COPD ([place])   . Gastric ulcer   . GERD (gastroesophageal reflux disease)   . History of Barrett's esophagus   . History of head and neck cancer   . Hx of colonic polyps   . Hyperlipidemia   . Hypertension   . Insomnia   . Lymphoma ([place])    Lymphoma  . Melanosis coli   . Osteopenia   . Osteoporosis   . Plantar fasciitis   . Tobacco abuse   . Tubular adenoma of colon   . Vallecular mass   . Varicose veins   . Vitamin D deficiency     Tobacco History: Social History   Tobacco Use  Smoking Status Former  . Years: 39.00  . Types: Cigarettes  . Quit date: 2022  . Years since quitting: 1.5  Smokeless Tobacco Never   Counseling given: Not Answered   Continue to not smoke  Outpatient Encounter Medications as of 01/06/2022  Medication Sig  . ALBUTEROL SULFATE HFA IN Inhale 2 Inhalers into the lungs every 4 (four) hours as needed. 2 puffs prn  . B Complex  Vitamins (B-COMPLEX/B-12) TABS Take 1 tablet by mouth daily.  . BUPROPION HCL ER, SR,  PO Take 150 mg by mouth daily.  . diclofenac (VOLTAREN) 50 MG EC tablet Take 50 mg by mouth 2 (two) times daily as needed.  Marland Kitchen LORazepam (ATIVAN) 0.5 MG tablet Take 0.5 mg by mouth daily as needed for anxiety.  . Multiple Vitamin (MULTIVITAMIN WITH MINERALS) TABS tablet Take 1 tablet by mouth daily.  . ondansetron (ZOFRAN) 4 MG tablet Take 4 mg by mouth as needed.  . potassium chloride (KLOR-CON M) 10 MEQ tablet Take 10 mEq by mouth every other day.  . rosuvastatin (CRESTOR) 5 MG tablet Take 10 mg by mouth daily. 10 mg 3 X week  . telmisartan (MICARDIS) 40 MG tablet Take 40 mg by mouth daily.  . [DISCONTINUED] chlorthalidone (HYGROTON) 25 MG tablet Take 25 mg by mouth every morning.  . [DISCONTINUED] esomeprazole (NEXIUM) 40 MG capsule Take 40 mg by mouth 2 (two) times daily.  . [DISCONTINUED] zolpidem (AMBIEN) 10 MG tablet Take 5 mg by mouth at bedtime.    No facility-administered encounter medications on file as of 01/06/2022.     Review of Systems  Review of Systems  No chest pain with exertion. No fever. No odynophagia. Comprehensive review of systems otherwise negative.   Physical Exam  BP 126/74 (BP Location: Left Arm, Patient Position: Sitting, Cuff Size: Normal)   Pulse 67   Temp 98.5 F (36.9 C) (Oral)   Ht 5\' 1"  (1.549 m)   Wt 149 lb 3.2 oz (67.7 kg)   SpO2 96%   BMI 28.19 kg/m   Wt Readings from Last 5 Encounters:  01/06/22 149 lb 3.2 oz (67.7 kg)  12/30/21 150 lb (68 kg)  10/22/21 147 lb 8 oz (66.9 kg)  12/30/20 143 lb 6.4 oz (65 kg)  07/09/20 133 lb (60.3 kg)    BMI Readings from Last 5 Encounters:  01/06/22 28.19 kg/m  12/30/21 28.34 kg/m  10/22/21 27.87 kg/m  12/30/20 27.10 kg/m  07/09/20 25.13 kg/m     Physical Exam General: Well appearing, in NAD Eyes: EOMI, no icterus ENT: No JVP appreciated, voice is mildly hoarse Respiratory: Distant sounds, no  wheeze CV: RRR, no murmurs Abdomen/GI: soft, BS present MSK: No synovitis, no joint effusions Neuro: Normal gait, no weakness Psych: Normal mood, flat affect   Assessment & Plan:   Chronic Cough: Worsening. Suspect multifactorial. Component of chronic bronchitis but no sputum production. Suspect largest contributors are refractory GERD and aspiration with dysphagia. To continue Breo. Will obtain PFTs next visit and if COPD present will move to triple therapy given frequent exacerbations. CXR ordered given ongoing cough and risk for aspiration.    Return in about 3 months (around 04/08/2022).   Lanier Clam, MD 01/06/2022

## 2022-01-07 ENCOUNTER — Other Ambulatory Visit (INDEPENDENT_AMBULATORY_CARE_PROVIDER_SITE_OTHER): Payer: Commercial Managed Care - HMO

## 2022-01-07 ENCOUNTER — Other Ambulatory Visit: Payer: Self-pay

## 2022-01-07 DIAGNOSIS — E871 Hypo-osmolality and hyponatremia: Secondary | ICD-10-CM

## 2022-01-07 LAB — BASIC METABOLIC PANEL
BUN: 27 mg/dL — ABNORMAL HIGH (ref 6–23)
CO2: 27 mEq/L (ref 19–32)
Calcium: 9.1 mg/dL (ref 8.4–10.5)
Chloride: 92 mEq/L — ABNORMAL LOW (ref 96–112)
Creatinine, Ser: 0.73 mg/dL (ref 0.40–1.20)
GFR: 88.08 mL/min (ref >60.00)
Glucose, Bld: 87 mg/dL (ref 70–99)
Potassium: 4.5 mEq/L (ref 3.5–5.1)
Sodium: 128 mEq/L — ABNORMAL LOW (ref 135–145)

## 2022-01-07 NOTE — Telephone Encounter (Signed)
I see that the patient was seen yesterday in pulmonary, we need the response for the pulmonary clearance procedures are scheduled on 01/13/2022  I see in another phone note that patient was cleared for procedures on 7/26  [facility] notified patient

## 2022-01-07 NOTE — Telephone Encounter (Signed)
Pt is still currently scheduled for procedures on 01/13/22 dependent upon pulmonary clearance and labs from today.  I sent another message to Dr. Silas Flood to follow up on clearance.  Will await lab results from today.

## 2022-01-07 NOTE — Telephone Encounter (Signed)
Please review pulmonary office visit note from yesterday regarding clearance. Thanks

## 2022-01-07 NOTE — Telephone Encounter (Signed)
That's crazy Im looking I thought I read somewhere she was ok to have the procedures. So she is still scheduled for 8-2

## 2022-01-07 NOTE — Telephone Encounter (Signed)
Pt has not come in for labs yet. I called and spoke with patient to remind her. She asked if we still wanted her to come in for labs. I told her yes, she will need to have labs done today because her sodium was low and the bowel prep could make that worse. I told pt to stop by the lab at her convenience today before 5 pm. Pt verbalized understanding and had no concerns at the end of the call.

## 2022-01-07 NOTE — Telephone Encounter (Signed)
Pt came in for labs today as requested. Will await results and pulmonary clearance.

## 2022-01-11 ENCOUNTER — Other Ambulatory Visit: Payer: Self-pay

## 2022-01-11 ENCOUNTER — Telehealth: Payer: Self-pay

## 2022-01-11 ENCOUNTER — Other Ambulatory Visit (INDEPENDENT_AMBULATORY_CARE_PROVIDER_SITE_OTHER): Payer: Commercial Managed Care - HMO

## 2022-01-11 DIAGNOSIS — K219 Gastro-esophageal reflux disease without esophagitis: Secondary | ICD-10-CM

## 2022-01-11 DIAGNOSIS — K921 Melena: Secondary | ICD-10-CM

## 2022-01-11 DIAGNOSIS — Z86010 Personal history of colonic polyps: Secondary | ICD-10-CM

## 2022-01-11 DIAGNOSIS — E871 Hypo-osmolality and hyponatremia: Secondary | ICD-10-CM

## 2022-01-11 DIAGNOSIS — K2270 Barrett's esophagus without dysplasia: Secondary | ICD-10-CM

## 2022-01-11 LAB — BASIC METABOLIC PANEL
BUN: 13 mg/dL (ref 6–23)
CO2: 28 mEq/L (ref 19–32)
Calcium: 9.3 mg/dL (ref 8.4–10.5)
Chloride: 99 mEq/L (ref 96–112)
Creatinine, Ser: 0.58 mg/dL (ref 0.40–1.20)
GFR: 96.92 mL/min (ref >60.00)
Glucose, Bld: 82 mg/dL (ref 70–99)
Potassium: 4.2 mEq/L (ref 3.5–5.1)
Sodium: 135 mEq/L (ref 135–145)

## 2022-01-11 NOTE — Telephone Encounter (Signed)
Spoke with patient to remind her that she is due for repeat labs today No appointment is necessary. Patient is aware that she can stop by the lab in the basement at her convenience today before 5 pm. Patient verbalized understanding and had no concerns at the end of the call.

## 2022-01-11 NOTE — Telephone Encounter (Signed)
Pt came by for labs.

## 2022-01-11 NOTE — Telephone Encounter (Signed)
-----   Message from Yevette Edwards, RN sent at 01/07/2022  2:29 PM EDT ----- Regarding: Labs today BMET today- order in epic

## 2022-01-13 ENCOUNTER — Encounter: Payer: Commercial Managed Care - HMO | Admitting: Gastroenterology

## 2022-01-14 ENCOUNTER — Telehealth: Payer: Self-pay | Admitting: Pulmonary Disease

## 2022-01-14 MED ORDER — ANORO ELLIPTA 62.5-25 MCG/ACT IN AEPB: 1.0000 | INHALATION_SPRAY | Freq: Every day | RESPIRATORY_TRACT | 5 refills | Status: DC

## 2022-01-14 NOTE — Telephone Encounter (Signed)
Called patient and verified what medication needed refilled and the pharmacy. Nothing further needed

## 2022-01-14 NOTE — Telephone Encounter (Signed)
Hey this is my step mom and she call and told me that the Anoro is way too expensive with her insurance.   Can we run a ticket to see what is covered for her insurance.   Thank you

## 2022-01-15 ENCOUNTER — Ambulatory Visit
Admission: RE | Admit: 2022-01-15 | Discharge: 2022-01-15 | Disposition: A | Discharge status: Home / Self Care | Payer: Commercial Managed Care - HMO | Source: Ambulatory Visit | Attending: Family Medicine | Admitting: Family Medicine

## 2022-01-15 ENCOUNTER — Other Ambulatory Visit (HOSPITAL_COMMUNITY): Payer: Self-pay

## 2022-01-15 DIAGNOSIS — K30 Functional dyspepsia: Secondary | ICD-10-CM

## 2022-01-15 MED ORDER — IOPAMIDOL (ISOVUE-300) INJECTION 61%
100.0000 mL | Freq: Once | INTRAVENOUS | Status: AC | PRN
  Administered 2022-01-15: 100 mL via INTRAVENOUS

## 2022-01-15 MED ORDER — INCRUSE ELLIPTA 62.5 MCG/ACT IN AEPB: 1.0000 | INHALATION_SPRAY | Freq: Every day | RESPIRATORY_TRACT | 0 refills | Status: DC

## 2022-01-15 NOTE — Telephone Encounter (Signed)
Called and spoke to patient and informed her that when she is done with the samples of Anoro to go an pick up Incruse. It will be 1 puff daily. I told her I would send 1 inhaler for now and see how she does with it. Nothing further needed

## 2022-01-15 NOTE — Telephone Encounter (Signed)
MH,  Patient is stating that the Anoro was costing her over $100 a month. So I asked the pharmacy team to run to see what was covered under insurance. Please note message below:  Good morning! I was able to do test claims on other LABA+LAMA inhalers and unfortunately, that is the only one covered under patient's plan. I ran claims on the LABA and LAMA inhalers indiviudally and the only ones covered are LAMA inhaler: Incruse $66.50 and the LABA inahler: Serevent Diskus $79.53.    Please advise sir

## 2022-01-15 NOTE — Telephone Encounter (Signed)
Ok to try Incruse 1 puff daily

## 2022-01-27 ENCOUNTER — Other Ambulatory Visit: Payer: Commercial Managed Care - HMO

## 2022-02-17 ENCOUNTER — Telehealth: Payer: Self-pay

## 2022-02-17 NOTE — Telephone Encounter (Signed)
Called and spoke with patient to confirm colonoscopy procedure appt at [facility] with Dr. Loletha Carrow on Thursday, 02/25/22 at 8:30 am. Pt came by the office today to pick up instructions. Pt is aware that she will need to arrive at [facility] by 7:00 am with a care partner. Pt verbalized understanding and had no concerns at the end of the call.

## 2022-02-18 ENCOUNTER — Encounter (HOSPITAL_COMMUNITY): Payer: Self-pay | Admitting: Gastroenterology

## 2022-02-25 ENCOUNTER — Ambulatory Visit (HOSPITAL_COMMUNITY)
Admission: RE | Admit: 2022-02-25 | Discharge: 2022-02-25 | Disposition: A | Discharge status: Home / Self Care | Payer: Commercial Managed Care - HMO | Attending: Gastroenterology | Admitting: Gastroenterology

## 2022-02-25 ENCOUNTER — Encounter (HOSPITAL_COMMUNITY): Payer: Self-pay | Admitting: Gastroenterology

## 2022-02-25 ENCOUNTER — Encounter (HOSPITAL_COMMUNITY)
Admission: RE | Disposition: A | Discharge status: Home / Self Care | Payer: Self-pay | Source: Home / Self Care | Attending: Gastroenterology

## 2022-02-25 ENCOUNTER — Other Ambulatory Visit: Payer: Self-pay

## 2022-02-25 ENCOUNTER — Ambulatory Visit (HOSPITAL_BASED_OUTPATIENT_CLINIC_OR_DEPARTMENT_OTHER): Payer: Commercial Managed Care - HMO | Admitting: Certified Registered Nurse Anesthetist

## 2022-02-25 ENCOUNTER — Ambulatory Visit (HOSPITAL_COMMUNITY): Payer: Commercial Managed Care - HMO | Admitting: Certified Registered Nurse Anesthetist

## 2022-02-25 DIAGNOSIS — I10 Essential (primary) hypertension: Secondary | ICD-10-CM | POA: Insufficient documentation

## 2022-02-25 DIAGNOSIS — Z87891 Personal history of nicotine dependence: Secondary | ICD-10-CM

## 2022-02-25 DIAGNOSIS — K5730 Diverticulosis of large intestine without perforation or abscess without bleeding: Secondary | ICD-10-CM

## 2022-02-25 DIAGNOSIS — E785 Hyperlipidemia, unspecified: Secondary | ICD-10-CM | POA: Diagnosis not present

## 2022-02-25 DIAGNOSIS — R1314 Dysphagia, pharyngoesophageal phase: Secondary | ICD-10-CM | POA: Diagnosis not present

## 2022-02-25 DIAGNOSIS — K2289 Other specified disease of esophagus: Secondary | ICD-10-CM | POA: Insufficient documentation

## 2022-02-25 DIAGNOSIS — R1313 Dysphagia, pharyngeal phase: Secondary | ICD-10-CM | POA: Insufficient documentation

## 2022-02-25 DIAGNOSIS — K257 Chronic gastric ulcer without hemorrhage or perforation: Secondary | ICD-10-CM | POA: Diagnosis not present

## 2022-02-25 DIAGNOSIS — Q399 Congenital malformation of esophagus, unspecified: Secondary | ICD-10-CM | POA: Insufficient documentation

## 2022-02-25 DIAGNOSIS — Z860100 Personal history of colon polyps, unspecified: Secondary | ICD-10-CM

## 2022-02-25 DIAGNOSIS — K219 Gastro-esophageal reflux disease without esophagitis: Secondary | ICD-10-CM | POA: Diagnosis not present

## 2022-02-25 DIAGNOSIS — Z09 Encounter for follow-up examination after completed treatment for conditions other than malignant neoplasm: Secondary | ICD-10-CM

## 2022-02-25 DIAGNOSIS — F418 Other specified anxiety disorders: Secondary | ICD-10-CM | POA: Diagnosis not present

## 2022-02-25 DIAGNOSIS — K648 Other hemorrhoids: Secondary | ICD-10-CM

## 2022-02-25 DIAGNOSIS — Z1211 Encounter for screening for malignant neoplasm of colon: Secondary | ICD-10-CM | POA: Insufficient documentation

## 2022-02-25 DIAGNOSIS — J449 Chronic obstructive pulmonary disease, unspecified: Secondary | ICD-10-CM | POA: Insufficient documentation

## 2022-02-25 DIAGNOSIS — M1990 Unspecified osteoarthritis, unspecified site: Secondary | ICD-10-CM | POA: Diagnosis not present

## 2022-02-25 DIAGNOSIS — K921 Melena: Secondary | ICD-10-CM

## 2022-02-25 HISTORY — PX: BIOPSY: SHX5522

## 2022-02-25 HISTORY — PX: ESOPHAGOGASTRODUODENOSCOPY (EGD) WITH PROPOFOL: SHX5813

## 2022-02-25 HISTORY — PX: COLONOSCOPY WITH PROPOFOL: SHX5780

## 2022-02-25 SURGERY — COLONOSCOPY WITH PROPOFOL
Anesthesia: Monitor Anesthesia Care

## 2022-02-25 MED ORDER — ONDANSETRON HCL 4 MG/2ML IJ SOLN
INTRAMUSCULAR | Status: DC | PRN
  Administered 2022-02-25: 4 mg via INTRAVENOUS

## 2022-02-25 MED ORDER — PROPOFOL 10 MG/ML IV BOLUS
INTRAVENOUS | Status: DC | PRN
  Administered 2022-02-25 (×2): 10 mg via INTRAVENOUS
  Administered 2022-02-25: 40 mg via INTRAVENOUS

## 2022-02-25 MED ORDER — FENTANYL CITRATE (PF) 100 MCG/2ML IJ SOLN
INTRAMUSCULAR | Status: AC
  Filled 2022-02-25: qty 2

## 2022-02-25 MED ORDER — FENTANYL CITRATE (PF) 100 MCG/2ML IJ SOLN
50.0000 ug | Freq: Once | INTRAMUSCULAR | Status: AC
  Administered 2022-02-25: 50 ug via INTRAVENOUS

## 2022-02-25 MED ORDER — PROPOFOL 500 MG/50ML IV EMUL
INTRAVENOUS | Status: DC | PRN
  Administered 2022-02-25: 125 ug/kg/min via INTRAVENOUS

## 2022-02-25 MED ORDER — LIDOCAINE 2% (20 MG/ML) 5 ML SYRINGE
INTRAMUSCULAR | Status: DC | PRN
  Administered 2022-02-25: 100 mg via INTRAVENOUS

## 2022-02-25 MED ORDER — PROPOFOL 1000 MG/100ML IV EMUL
INTRAVENOUS | Status: AC
  Filled 2022-02-25: qty 100

## 2022-02-25 MED ORDER — PROPOFOL 500 MG/50ML IV EMUL
INTRAVENOUS | Status: AC
  Filled 2022-02-25: qty 50

## 2022-02-25 MED ORDER — LACTATED RINGERS IV SOLN: INTRAVENOUS | Status: DC | PRN

## 2022-02-25 SURGICAL SUPPLY — 25 items

## 2022-02-25 NOTE — Progress Notes (Signed)
50 mcg fentanyl given for headache of 9/10 per Dr Royce Macadamia.

## 2022-02-25 NOTE — H&P (Signed)
History and Physical:  This patient presents for endoscopic testing for: Pharyngeal esophageal dysphagia Barrett's esophagus without dysplasia History of colon polyps  61 year old woman known to me from prior evaluations.  [facility] office visit 12/30/2021 outlines clinical details of her conditions.  She continues to have pharyngeal esophageal dysphagia and dyspnea, quit smoking a year ago and has recently seen pulmonary for evaluation.  She describes black stools and was overdue for surveillance of both Barrett's and colon polyps. She was markedly hyponatremic on labs after that office visit, and I believed it was due to her thiazide diuretic.  Briefly holding that medicine normalized her sodium, and she has remained off that medicine since then.  No other clinical changes.  She reports continued dysphagia and chronic cough.  Patient is otherwise without complaints or active issues today.   Past Medical History: Past Medical History:  Diagnosis Date   Anxiety    Aortic atherosclerosis ([facility])    Arthritis    WRIST AND BACK   Atrophic vaginitis    CKD (chronic kidney disease), stage II    COPD (chronic obstructive pulmonary disease) ([facility])    COVID-19    Depression    Diverticulosis    Emphysema/COPD ([facility])    Gastric ulcer    GERD (gastroesophageal reflux disease)    History of Barrett's esophagus    History of head and neck cancer    Hx of colonic polyps    Hyperlipidemia    Hypertension    Insomnia    Lymphoma ([facility])    Lymphoma   Melanosis coli    Osteopenia    Osteoporosis    Plantar fasciitis    Tobacco abuse    Tubular adenoma of colon    Vallecular mass    Varicose veins    Vitamin D deficiency      Past Surgical History: Past Surgical History:  Procedure Laterality Date   ABDOMINAL HYSTERECTOMY     APPENDECTOMY     BILATERAL SALPINGOOPHORECTOMY     CHOLECYSTECTOMY     ESOPHAGEAL MANOMETRY N/A 04/28/2016   Procedure: ESOPHAGEAL MANOMETRY (EM);  Surgeon:  Mauri Pole, MD;  Location: [facility];  Service: Endoscopy;  Laterality: N/A;  dr. Loletha Carrow   KNEE ARTHROSCOPY     LYMPH NODE BIOPSY     TONSILLECTOMY     UPPER GASTROINTESTINAL ENDOSCOPY     WRIST SURGERY Right     Allergies: Allergies  Allergen Reactions   Hydrocodone-Acetaminophen Anaphylaxis    Throat/tongue swells   Penicillins Nausea Only and Rash    Stomach upset Has patient had a PCN reaction causing immediate rash, facial/tongue/throat swelling, SOB or lightheadedness with hypotension: No Has patient had a PCN reaction causing severe rash involving mucus membranes or skin necrosis: Yes Has patient had a PCN reaction that required hospitalization: NO Has patient had a PCN reaction occurring within the last 10 years: Yes If all of the above answers are "NO", then may proceed with Cephalosporin use.     Zestril [Lisinopril] Cough   Latex Itching    Outpatient Meds: No current facility-administered medications for this encounter.      ___________________________________________________________________ Objective   Exam:  There were no vitals taken for this visit. Vital signs have been taken in the preop area and are on file in the chart. Raspy vocal quality as before. CV: RRR without murmur, S1/S2 Resp: clear to auscultation bilaterally, normal RR and effort noted GI: soft, no tenderness, with active bowel sounds.   Assessment: Pharyngeal  esophageal dysphagia Barrett's esophagus without dysplasia History of colon polyps   Plan: Colonoscopy EGD  The benefits and risks of the planned procedure were described in detail with the patient or (when appropriate) their health care proxy.  Risks were outlined as including, but not limited to, bleeding, infection, perforation, adverse medication reaction leading to cardiac or pulmonary decompensation, pancreatitis (if ERCP).  The limitation of incomplete mucosal visualization was also discussed.  No guarantees or  warranties were given.    The patient is appropriate for an endoscopic procedure in the ambulatory setting.   - Wilfrid Lund, MD

## 2022-02-25 NOTE — Anesthesia Preprocedure Evaluation (Addendum)
Anesthesia Evaluation  Patient identified by MRN, date of birth, ID band Patient awake    Reviewed: Allergy & Precautions, NPO status , Patient's Chart, lab work & pertinent test results, reviewed documented beta blocker date and time   Airway Mallampati: II  TM Distance: >3 FB Neck ROM: Full    Dental  (+) Partial Upper, Dental Advisory Given,    Pulmonary COPD,  COPD inhaler, former smoker,    Pulmonary exam normal breath sounds clear to auscultation       Cardiovascular hypertension, Pt. on medications Normal cardiovascular exam Rhythm:Regular Rate:Normal     Neuro/Psych PSYCHIATRIC DISORDERS Anxiety Depression negative neurological ROS     GI/Hepatic Neg liver ROS, PUD, GERD  Medicated,Hx/o Barrett's esophagus Black tarry stools Hx/o colon adenoma   Endo/Other  Hyperlipidemia  Renal/GU Renal disease  negative genitourinary   Musculoskeletal  (+) Arthritis , Osteoarthritis,    Abdominal   Peds  Hematology negative hematology ROS (+)   Anesthesia Other Findings   Reproductive/Obstetrics                            Anesthesia Physical Anesthesia Plan  ASA: 3  Anesthesia Plan: MAC   Post-op Pain Management: Minimal or no pain anticipated   Induction: Intravenous  PONV Risk Score and Plan: 2 and Treatment may vary due to age or medical condition and Propofol infusion  Airway Management Planned: Natural Airway, Nasal Cannula and Simple Face Mask  Additional Equipment: None  Intra-op Plan:   Post-operative Plan:   Informed Consent: I have reviewed the patients History and Physical, chart, labs and discussed the procedure including the risks, benefits and alternatives for the proposed anesthesia with the patient or authorized representative who has indicated his/her understanding and acceptance.     Dental advisory given  Plan Discussed with: Anesthesiologist and  CRNA  Anesthesia Plan Comments:        Anesthesia Quick Evaluation

## 2022-02-25 NOTE — Transfer of Care (Signed)
Immediate Anesthesia Transfer of Care Note  Patient: Emily Santiago  Procedure(s) Performed: COLONOSCOPY WITH PROPOFOL ESOPHAGOGASTRODUODENOSCOPY (EGD) WITH PROPOFOL BIOPSY  Patient Location: PACU and Endoscopy Unit  Anesthesia Type:MAC  Level of Consciousness: awake, alert  and oriented  Airway & Oxygen Therapy: Patient Spontanous Breathing and Patient connected to face mask oxygen  Post-op Assessment: Report given to RN and Post -op Vital signs reviewed and stable  Post vital signs: Reviewed and stable  Last Vitals:  Vitals Value Taken Time  BP    Temp    Pulse    Resp    SpO2      Last Pain:  Vitals:   02/25/22 0801  TempSrc: Oral  PainSc: 0-No pain         Complications: No notable events documented.

## 2022-02-25 NOTE — Interval H&P Note (Signed)
History and Physical Interval Note:  02/25/2022 8:37 AM  Emily Santiago  has presented today for surgery, with the diagnosis of GERD, Barrett's esopagus, black stool, and hx of adenoma.  The various methods of treatment have been discussed with the patient and family. After consideration of risks, benefits and other options for treatment, the patient has consented to  Procedure(s): COLONOSCOPY WITH PROPOFOL (N/A) ESOPHAGOGASTRODUODENOSCOPY (EGD) WITH PROPOFOL (N/A) as a surgical intervention.  The patient's history has been reviewed, patient examined, no change in status, stable for surgery.  I have reviewed the patient's chart and labs.  Questions were answered to the patient's satisfaction.     [name]

## 2022-02-25 NOTE — Discharge Instructions (Signed)
YOU HAD AN ENDOSCOPIC PROCEDURE TODAY: Refer to the procedure report and other information in the discharge instructions given to you for any specific questions about what was found during the examination. If this information does not answer your questions, please call [place] office at [phone] to clarify.  ° °YOU SHOULD EXPECT: Some feelings of bloating in the abdomen. Passage of more gas than usual. Walking can help get rid of the air that was put into your GI tract during the procedure and reduce the bloating. If you had a lower endoscopy (such as a colonoscopy or flexible sigmoidoscopy) you may notice spotting of blood in your stool or on the toilet paper. Some abdominal soreness may be present for a day or two, also. ° °DIET: Your first meal following the procedure should be a light meal and then it is ok to progress to your normal diet. A half-sandwich or bowl of soup is an example of a good first meal. Heavy or fried foods are harder to digest and may make you feel nauseous or bloated. Drink plenty of fluids but you should avoid alcoholic beverages for 24 hours. If you had a esophageal dilation, please see attached instructions for diet.   ° °ACTIVITY: Your care partner should take you home directly after the procedure. You should plan to take it easy, moving slowly for the rest of the day. You can resume normal activity the day after the procedure however YOU SHOULD NOT DRIVE, use power tools, machinery or perform tasks that involve climbing or major physical exertion for 24 hours (because of the sedation medicines used during the test).  ° °SYMPTOMS TO REPORT IMMEDIATELY: °A gastroenterologist can be reached at any hour. Please call [phone]  for any of the following symptoms:  °Following lower endoscopy (colonoscopy, flexible sigmoidoscopy) °Excessive amounts of blood in the stool  °Significant tenderness, worsening of abdominal pains  °Swelling of the abdomen that is new, acute  °Fever of 100° or  higher  °Following upper endoscopy (EGD, EUS, ERCP, esophageal dilation) °Vomiting of blood or coffee ground material  °New, significant abdominal pain  °New, significant chest pain or pain under the shoulder blades  °Painful or persistently difficult swallowing  °New shortness of breath  °Black, tarry-looking or red, bloody stools ° °FOLLOW UP:  °If any biopsies were taken you will be contacted by phone or by letter within the next 1-3 weeks. Call [phone]  if you have not heard about the biopsies in 3 weeks.  °Please also call with any specific questions about appointments or follow up tests. ° °

## 2022-02-25 NOTE — Op Note (Signed)
[facility] Patient Name: Emily Santiago Procedure Date: 02/25/2022 MRN: [id number] Attending MD: Estill Cotta. Loletha Carrow , MD Date of Birth: 1960/09/05 CSN: [id number] Age: 61 Admit Type: Outpatient Procedure:                Upper GI endoscopy Indications:              Pharyngeal phase dysphagia (former smoker), Melena                            (reported, normal Hgb), Follow-up of chronic                            gastric ulcer (aspirin-induced, H pylori negativeon                            prior Bx)                           Possible Barrett's esophagus in past (IM on Bx 2017                            - possibly gastric cardia), none on subsequent Bx Providers:                Mallie Mussel L. Loletha Carrow, MD, Allayne Gitelman, RN, Benetta Spar, Technician Referring MD:              Medicines:                Monitored Anesthesia Care Complications:            No immediate complications. Estimated Blood Loss:     Estimated blood loss was minimal. Procedure:                Pre-Anesthesia Assessment:                           - Prior to the procedure, a History and Physical                            was performed, and patient medications and                            allergies were reviewed. The patient's tolerance of                            previous anesthesia was also reviewed. The risks                            and benefits of the procedure and the sedation                            options and risks were discussed with the patient.  All questions were answered, and informed consent                            was obtained. Prior Anticoagulants: The patient has                            taken no previous anticoagulant or antiplatelet                            agents. ASA Grade Assessment: III - A patient with                            severe systemic disease. After reviewing the risks                            and benefits, the  patient was deemed in                            satisfactory condition to undergo the procedure.                           After obtaining informed consent, the endoscope was                            passed under direct vision. Throughout the                            procedure, the patient's blood pressure, pulse, and                            oxygen saturations were monitored continuously. The                            [id number]-[id number] ([id number]) Olympus endoscope was introduced                            through the mouth, and advanced to the second part                            of duodenum. The upper GI endoscopy was                            accomplished without difficulty. The patient                            tolerated the procedure well. Scope In: Scope Out: Findings:      The larynx was normal.      One tongue of salmon-colored mucosa was present. No other visible       abnormalities were present under WL or NBI. The maximum longitudinal       extent of these esophageal mucosal changes was 1 cm in length. Biopsies       were taken with a cold forceps for histology.      The distal esophagus was tortuous.  The exam of the esophagus was otherwise normal.      Two non-bleeding cratered gastric ulcers with no stigmata of bleeding       were found in the prepyloric region of the stomach. The largest lesion       was 6 mm in largest dimension. Multiple biopsies were obtained on the       greater curvature of the gastric body, on the lesser curvature of the       gastric body, on the greater curvature of the gastric antrum and on the       lesser curvature of the gastric antrum with cold forceps for histology.      The exam of the stomach was otherwise normal.      The cardia and gastric fundus were normal on retroflexion.      The examined duodenum was normal. Impression:               - Normal larynx.                           - Salmon-colored mucosa. Biopsied.                            - Tortuous esophagus.                           - Non-bleeding gastric ulcers with no stigmata of                            bleeding.                           - Normal examined duodenum.                           - Multiple biopsies were obtained on the greater                            curvature of the gastric body, on the lesser                            curvature of the gastric body, on the greater                            curvature of the gastric antrum and on the lesser                            curvature of the gastric antrum.                           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 Moderate Sedation:      MAC  sedation used Recommendation:           - Patient has a contact number available for                            emergencies. The signs and symptoms of potential                            delayed complications were discussed with the                            patient. Return to normal activities tomorrow.                            Written discharge instructions were provided to the                            patient.                           - Resume previous diet.                           - Discontinue aspirin and NSAIDs. (especially                            BC/Goody powder)                           - Continue to take your Nexium every day. Procedure Code(s):        --- Professional ---                           [id number], Esophagogastroduodenoscopy, flexible,                            transoral; with biopsy, single or multiple Diagnosis Code(s):        --- Professional ---                           K22.8, Other specified diseases of esophagus                           Q39.9, Congenital malformation of esophagus,                            unspecified                           K25.9, Gastric  ulcer, unspecified as acute or                            chronic, without hemorrhage or perforation                           R13.13, Dysphagia, pharyngeal phase  K92.1, Melena (includes Hematochezia)                           K25.7, Chronic gastric ulcer without hemorrhage or                            perforation CPT copyright 2019 American Medical Association. All rights reserved. The codes documented in this report are preliminary and upon coder review may  be revised to meet current compliance requirements. Khyleigh Furney L. Loletha Carrow, MD 02/25/2022 9:34:07 AM This report has been signed electronically. Number of Addenda: 0

## 2022-02-25 NOTE — Anesthesia Postprocedure Evaluation (Signed)
Anesthesia Post Note  Patient: Emily Santiago  Procedure(s) Performed: COLONOSCOPY WITH PROPOFOL ESOPHAGOGASTRODUODENOSCOPY (EGD) WITH PROPOFOL BIOPSY     Patient location during evaluation: PACU Anesthesia Type: MAC Level of consciousness: awake and alert Pain management: pain level controlled Vital Signs Assessment: post-procedure vital signs reviewed and stable Respiratory status: spontaneous breathing, nonlabored ventilation and respiratory function stable Cardiovascular status: stable and blood pressure returned to baseline Postop Assessment: no apparent nausea or vomiting Anesthetic complications: no   No notable events documented.  Last Vitals:  Vitals:   02/25/22 1000 02/25/22 1008  BP: 110/64 117/71  Pulse: (!) 58 (!) 56  Resp: 11 13  Temp:    SpO2: 98% 97%    Last Pain:  Vitals:   02/25/22 1008  TempSrc:   PainSc: 5                  Anorah Trias A.

## 2022-02-25 NOTE — Anesthesia Procedure Notes (Signed)
Procedure Name: MAC Date/Time: 02/25/2022 8:40 AM  Performed by: Maxwell Caul, CRNAPre-anesthesia Checklist: Patient identified, Emergency Drugs available, Suction available and Patient being monitored Oxygen Delivery Method: Simple face mask

## 2022-02-25 NOTE — Op Note (Signed)
[facility] Patient Name: Emily Santiago Procedure Date: 02/25/2022 MRN: [id number] Attending MD: Emily Cotta. Emily Santiago , MD Date of Birth: 1961-05-02 CSN: [id number] Age: 61 Admit Type: Outpatient Procedure:                Colonoscopy Indications:              Surveillance: Personal history of adenomatous                            polyps on last colonoscopy > 3 years ago                           TA x 2 (one > 52m) Dec 2018 Providers:                HEstill Cotta DLoletha Carrow MD, JAllayne Gitelman RN, ABenetta Spar Technician Referring MD:              Medicines:                Monitored Anesthesia Care Complications:            No immediate complications. Estimated Blood Loss:     Estimated blood loss: none. Procedure:                Pre-Anesthesia Assessment:                           - Prior to the procedure, a History and Physical                            was performed, and patient medications and                            allergies were reviewed. The patient's tolerance of                            previous anesthesia was also reviewed. The risks                            and benefits of the procedure and the sedation                            options and risks were discussed with the patient.                            All questions were answered, and informed consent                            was obtained. Prior Anticoagulants: The patient has                            taken no previous anticoagulant or antiplatelet  agents. ASA Grade Assessment: III - A patient with                            severe systemic disease. After reviewing the risks                            and benefits, the patient was deemed in                            satisfactory condition to undergo the procedure.                           After obtaining informed consent, the colonoscope                            was passed under direct vision.  Throughout the                            procedure, the patient's blood pressure, pulse, and                            oxygen saturations were monitored continuously. The                            [id number]-HQ190L ([id number]) Olympus colonoscope was                            introduced through the anus and advanced to the the                            cecum, identified by appendiceal orifice and                            ileocecal valve. The colonoscopy was somewhat                            difficult due to a redundant colon. Successful                            completion of the procedure was aided by using                            manual pressure and straightening and shortening                            the scope to obtain bowel loop reduction. The                            patient tolerated the procedure well. The quality                            of the bowel preparation was good. The ileocecal  valve, appendiceal orifice, and rectum were                            photographed. Scope In: 9:06:39 AM Scope Out: 9:21:46 AM Scope Withdrawal Time: 0 hours 10 minutes 5 seconds  Total Procedure Duration: 0 hours 15 minutes 7 seconds  Findings:      The perianal and digital rectal examinations were normal.      Repeat examination of right colon under NBI performed.      Melanosis was found in the entire colon.      A few diverticula were found in the left colon and right colon.      Internal hemorrhoids were found. The hemorrhoids were small.      The exam was otherwise without abnormality on direct and retroflexion       views. Impression:               - Melanosis in the colon.                           - Diverticulosis in the left colon and in the right                            colon.                           - Internal hemorrhoids.                           - The examination was otherwise normal on direct                            and retroflexion  views.                           - No specimens collected. Moderate Sedation:      MAC sedation used Recommendation:           - Patient has a contact number available for                            emergencies. The signs and symptoms of potential                            delayed complications were discussed with the                            patient. Return to normal activities tomorrow.                            Written discharge instructions were provided to the                            patient.                           - Resume previous diet.                           -  Continue present medications.                           - Repeat colonoscopy in 10 years for surveillance.                           - See the other procedure note for documentation of                            additional recommendations. Procedure Code(s):        --- Professional ---                           X9371, Colorectal cancer screening; colonoscopy on                            individual at high risk Diagnosis Code(s):        --- Professional ---                           Z86.010, Personal history of colonic polyps                           K64.8, Other hemorrhoids                           K57.30, Diverticulosis of large intestine without                            perforation or abscess without bleeding CPT copyright 2019 American Medical Association. All rights reserved. The codes documented in this report are preliminary and upon coder review may  be revised to meet current compliance requirements. Emily Santiago L. Emily Carrow, MD 02/25/2022 9:37:37 AM This report has been signed electronically. Number of Addenda: 0

## 2022-02-26 LAB — SURGICAL PATHOLOGY

## 2022-02-28 ENCOUNTER — Encounter (HOSPITAL_COMMUNITY): Payer: Self-pay | Admitting: Gastroenterology

## 2022-03-02 ENCOUNTER — Encounter: Payer: Self-pay | Admitting: Gastroenterology

## 2022-03-03 ENCOUNTER — Other Ambulatory Visit: Payer: Self-pay | Admitting: Pulmonary Disease

## 2022-03-29 ENCOUNTER — Telehealth: Payer: Self-pay | Admitting: Pulmonary Disease

## 2022-03-29 ENCOUNTER — Other Ambulatory Visit: Payer: Self-pay | Admitting: Pulmonary Disease

## 2022-03-29 MED ORDER — INCRUSE ELLIPTA 62.5 MCG/ACT IN AEPB: 1.0000 | INHALATION_SPRAY | Freq: Every day | RESPIRATORY_TRACT | 3 refills | Status: DC

## 2022-03-29 NOTE — Telephone Encounter (Signed)
Called and spoke to patient and advised her that I was sending in her refills for her inhaler. Nothing further needed at this time.

## 2022-03-30 ENCOUNTER — Telehealth: Payer: Self-pay | Admitting: *Deleted

## 2022-03-30 NOTE — Telephone Encounter (Signed)
Good evening Dr Silas Flood,  Patient called and is wanting some clarification on why the Ct scan was ordered.   Please advise sir

## 2022-03-30 NOTE — Telephone Encounter (Signed)
Her CT lung cancer screening scan in July had a small 8 mm nodule that radiology recommended a 3 month follow up scan. So the repeat was ordered at time of last office visit after reviewing the scan.

## 2022-03-30 NOTE — Telephone Encounter (Signed)
Patient called and had questions regarding CT scans. Please call and advise. [phone]

## 2022-03-30 NOTE — Telephone Encounter (Signed)
Called and spoke to patient about Dr Silas Flood recommendations. Patient verbalized understanding. Nothing further needed

## 2022-03-31 ENCOUNTER — Ambulatory Visit
Admission: RE | Admit: 2022-03-31 | Discharge: 2022-03-31 | Disposition: A | Discharge status: Home / Self Care | Payer: Commercial Managed Care - HMO | Source: Ambulatory Visit | Attending: Pulmonary Disease | Admitting: Pulmonary Disease

## 2022-03-31 DIAGNOSIS — R911 Solitary pulmonary nodule: Secondary | ICD-10-CM

## 2022-04-02 NOTE — Telephone Encounter (Signed)
Called patient back and she states she is wanting to know if MH is still needing to see her for follow up.    Can she just get results of scan over phone  [place] please advise

## 2022-04-03 NOTE — Telephone Encounter (Signed)
Yes, I recommend follow up visit. We can discuss results over phone but there are many things we addressed at last visit that I recommend follow up for.

## 2022-04-05 ENCOUNTER — Telehealth: Payer: Self-pay | Admitting: Pulmonary Disease

## 2022-04-05 DIAGNOSIS — R911 Solitary pulmonary nodule: Secondary | ICD-10-CM

## 2022-04-05 NOTE — Telephone Encounter (Signed)
MH,  Called patient and she is wanting to know the results of her CT scan  Please advise sir

## 2022-04-05 NOTE — Telephone Encounter (Signed)
Called and spoke to patient and advised her that Caney wants her to follow up due to information he discussed with her at last office visit. Nothing further needed

## 2022-04-06 NOTE — Telephone Encounter (Signed)
Called patietn back to advise her that hunsucker is aware of her wanting her CT results. He is in office tomorrow he will look then. Please do not tell patient that he is in clinic tomorrow. Nothing further needed

## 2022-04-06 NOTE — Telephone Encounter (Signed)
Patient checking on message sent for CT results. Patient phone number is [phone].

## 2022-04-07 ENCOUNTER — Telehealth: Payer: Self-pay | Admitting: Pulmonary Disease

## 2022-04-07 ENCOUNTER — Ambulatory Visit: Payer: Commercial Managed Care - HMO | Admitting: Pulmonary Disease

## 2022-04-07 ENCOUNTER — Other Ambulatory Visit: Payer: Self-pay | Admitting: Emergency Medicine

## 2022-04-07 DIAGNOSIS — R911 Solitary pulmonary nodule: Secondary | ICD-10-CM

## 2022-04-07 NOTE — Telephone Encounter (Signed)
Spoke with patient. Stable 8 mm RLL nodule with substantial GG component. Stable compared to 12/2021 when first discovered. Recommended surveillance imaging in 6 months in accordance to radiology report. She would like to know if biopsy possible. Will message colleagues.

## 2022-04-07 NOTE — Telephone Encounter (Signed)
Called pt to inform her about CT appt detaisl for next April, she had some questions about a biopsy she said Dr. Silas Flood mentioned she needed. She wants to know whats going on with that if someone can give her a call.   Thanks

## 2022-04-07 NOTE — Progress Notes (Signed)
Request made to schedule navigational bronchoscopy to evaluate right lower lobe pulmonary nodule

## 2022-04-07 NOTE — Telephone Encounter (Signed)
Called and spoke to patient and told her that the CT is just as a precaution just incase we can not do the biopsy or if the biopsy does not show any results. She verbalized understanding. Nothing further needed

## 2022-04-07 NOTE — Addendum Note (Signed)
Addended by: Retia Passe on: 04/07/2022 08:52 AM   Modules accepted: Orders

## 2022-04-07 NOTE — Telephone Encounter (Signed)
Like we discussed this morning - I sent a message regarding a biopsy. I hope to have mor information next week.  The CT scan for 6 months we can keep in case a biopsy is not possible or the biopsy does not yield a diagnosis. It is to make sure nothing falls through the cracks.

## 2022-04-08 ENCOUNTER — Encounter: Payer: Self-pay | Admitting: Emergency Medicine

## 2022-04-15 ENCOUNTER — Telehealth: Payer: Self-pay | Admitting: *Deleted

## 2022-04-15 MED ORDER — SEREVENT DISKUS 50 MCG/ACT IN AEPB: 1.0000 | INHALATION_SPRAY | Freq: Two times a day (BID) | RESPIRATORY_TRACT | 4 refills | Status: DC

## 2022-04-15 NOTE — Telephone Encounter (Signed)
Called and spoke with patient. She stated that since using the Incruse, she has been feeling woozy and "drunk". She also feels like she is not able to think clearly from brain fog. She believes that these symptoms are coming from the Incruse. She last used the inhaler last night but has been on the inhaler for 2 months.   She spoke with her pharmacist who suggested that she be switched to another inhaler.   Pharmacy is [facility] in [place].   Dr. Silas Flood, can you please advise? Thanks!

## 2022-04-15 NOTE — Telephone Encounter (Signed)
Called and spoke with patient. She verbalized understanding. She wishes to try the Serevent inhaler. She verbalized understanding of the directions.   RX has been sent.   Nothing further needed at time of call.

## 2022-04-15 NOTE — Telephone Encounter (Signed)
I do not think this is due to the Incruse. Ok to stop. There is no other option for LAMA due to her insurance coverage. She can be prescribed Serevent for LABA therapy if she would like to try something else. She needs a follow up visit with me, let's say early December after her bronchoscopy.

## 2022-04-15 NOTE — Telephone Encounter (Signed)
patient states shes feeling lightheaded and "drunk feeling" and altered mental state since taking her medication incruse and would like to see if there are alternatives.    Please call and advise patient [phone]

## 2022-04-26 ENCOUNTER — Telehealth: Payer: Self-pay | Admitting: Pulmonary Disease

## 2022-04-26 NOTE — Telephone Encounter (Signed)
I spoke to pt and went over information with her.  Nothing further needed.

## 2022-04-29 ENCOUNTER — Other Ambulatory Visit: Payer: Commercial Managed Care - HMO

## 2022-04-29 ENCOUNTER — Other Ambulatory Visit: Payer: Self-pay | Admitting: *Deleted

## 2022-04-29 DIAGNOSIS — Z01818 Encounter for other preprocedural examination: Secondary | ICD-10-CM

## 2022-04-30 ENCOUNTER — Encounter (HOSPITAL_COMMUNITY): Payer: Self-pay | Admitting: Emergency Medicine

## 2022-04-30 ENCOUNTER — Other Ambulatory Visit: Payer: Self-pay

## 2022-04-30 NOTE — Progress Notes (Signed)
Spoke with pt for pre-op call. Pt denies cardiac history and Diabetes. Pt is treated for HTN.   Shower instructions given to pt and she voiced understanding.   Covid test done on 04/29/22. Results pending.

## 2022-05-01 LAB — NOVEL CORONAVIRUS, NAA: SARS-CoV-2, NAA: NOT DETECTED

## 2022-05-01 LAB — SPECIMEN STATUS REPORT

## 2022-05-03 ENCOUNTER — Ambulatory Visit (HOSPITAL_COMMUNITY): Payer: Commercial Managed Care - HMO | Admitting: Certified Registered"

## 2022-05-03 ENCOUNTER — Ambulatory Visit (HOSPITAL_COMMUNITY): Payer: Commercial Managed Care - HMO

## 2022-05-03 ENCOUNTER — Encounter (HOSPITAL_COMMUNITY)
Admission: RE | Disposition: A | Discharge status: Home / Self Care | Payer: Self-pay | Source: Home / Self Care | Attending: Emergency Medicine

## 2022-05-03 ENCOUNTER — Ambulatory Visit (HOSPITAL_COMMUNITY)
Admission: RE | Admit: 2022-05-03 | Discharge: 2022-05-03 | Disposition: A | Discharge status: Home / Self Care | Payer: Commercial Managed Care - HMO | Attending: Emergency Medicine | Admitting: Emergency Medicine

## 2022-05-03 ENCOUNTER — Ambulatory Visit (HOSPITAL_BASED_OUTPATIENT_CLINIC_OR_DEPARTMENT_OTHER): Payer: Commercial Managed Care - HMO | Admitting: Certified Registered"

## 2022-05-03 ENCOUNTER — Other Ambulatory Visit: Payer: Self-pay

## 2022-05-03 ENCOUNTER — Encounter (HOSPITAL_COMMUNITY): Payer: Self-pay | Admitting: Emergency Medicine

## 2022-05-03 DIAGNOSIS — F32A Depression, unspecified: Secondary | ICD-10-CM | POA: Diagnosis not present

## 2022-05-03 DIAGNOSIS — Z87891 Personal history of nicotine dependence: Secondary | ICD-10-CM

## 2022-05-03 DIAGNOSIS — N182 Chronic kidney disease, stage 2 (mild): Secondary | ICD-10-CM | POA: Insufficient documentation

## 2022-05-03 DIAGNOSIS — F419 Anxiety disorder, unspecified: Secondary | ICD-10-CM | POA: Diagnosis not present

## 2022-05-03 DIAGNOSIS — J439 Emphysema, unspecified: Secondary | ICD-10-CM | POA: Diagnosis not present

## 2022-05-03 DIAGNOSIS — R911 Solitary pulmonary nodule: Secondary | ICD-10-CM

## 2022-05-03 DIAGNOSIS — M797 Fibromyalgia: Secondary | ICD-10-CM | POA: Insufficient documentation

## 2022-05-03 DIAGNOSIS — I129 Hypertensive chronic kidney disease with stage 1 through stage 4 chronic kidney disease, or unspecified chronic kidney disease: Secondary | ICD-10-CM | POA: Insufficient documentation

## 2022-05-03 DIAGNOSIS — C3431 Malignant neoplasm of lower lobe, right bronchus or lung: Secondary | ICD-10-CM | POA: Diagnosis not present

## 2022-05-03 DIAGNOSIS — J449 Chronic obstructive pulmonary disease, unspecified: Secondary | ICD-10-CM | POA: Diagnosis not present

## 2022-05-03 DIAGNOSIS — Z79899 Other long term (current) drug therapy: Secondary | ICD-10-CM | POA: Insufficient documentation

## 2022-05-03 DIAGNOSIS — N189 Chronic kidney disease, unspecified: Secondary | ICD-10-CM

## 2022-05-03 DIAGNOSIS — K219 Gastro-esophageal reflux disease without esophagitis: Secondary | ICD-10-CM | POA: Insufficient documentation

## 2022-05-03 HISTORY — DX: Family history of other specified conditions: Z84.89

## 2022-05-03 HISTORY — PX: BRONCHIAL BIOPSY: SHX5109

## 2022-05-03 HISTORY — DX: Attention-deficit hyperactivity disorder, unspecified type: F90.9

## 2022-05-03 HISTORY — DX: Personal history of urinary calculi: Z87.442

## 2022-05-03 HISTORY — DX: Fibromyalgia: M79.7

## 2022-05-03 HISTORY — DX: Allergy, unspecified, initial encounter: T78.40XA

## 2022-05-03 HISTORY — DX: Other complications of anesthesia, initial encounter: T88.59XA

## 2022-05-03 HISTORY — PX: BRONCHIAL BRUSHINGS: SHX5108

## 2022-05-03 LAB — BASIC METABOLIC PANEL
Anion gap: 13 (ref 5–15)
BUN: 10 mg/dL (ref 8–23)
CO2: 22 mmol/L (ref 22–32)
Calcium: 9.1 mg/dL (ref 8.9–10.3)
Chloride: 106 mmol/L (ref 98–111)
Creatinine, Ser: 0.51 mg/dL (ref 0.44–1.00)
GFR, Estimated: >60 mL/min (ref >60)
Glucose, Bld: 83 mg/dL (ref 70–99)
Potassium: 3.7 mmol/L (ref 3.5–5.1)
Sodium: 141 mmol/L (ref 135–145)

## 2022-05-03 LAB — CBC
HCT: 37.4 % (ref 36.0–46.0)
Hemoglobin: 12.2 g/dL (ref 12.0–15.0)
MCH: 30.7 pg (ref 26.0–34.0)
MCHC: 32.6 g/dL (ref 30.0–36.0)
MCV: 94 fL (ref 80.0–100.0)
Platelets: 192 10*3/uL (ref 150–400)
RBC: 3.98 MIL/uL (ref 3.87–5.11)
RDW: 13 % (ref 11.5–15.5)
WBC: 4.9 10*3/uL (ref 4.0–10.5)
nRBC: 0 % (ref 0.0–0.2)

## 2022-05-03 SURGERY — BRONCHOSCOPY, WITH BIOPSY USING ELECTROMAGNETIC NAVIGATION
Anesthesia: General | Laterality: Right

## 2022-05-03 MED ORDER — EPHEDRINE SULFATE-NACL 50-0.9 MG/10ML-% IV SOSY
PREFILLED_SYRINGE | INTRAVENOUS | Status: DC | PRN
  Administered 2022-05-03: 2.5 mg via INTRAVENOUS

## 2022-05-03 MED ORDER — DEXAMETHASONE SODIUM PHOSPHATE 10 MG/ML IJ SOLN
INTRAMUSCULAR | Status: DC | PRN
  Administered 2022-05-03: 10 mg via INTRAVENOUS

## 2022-05-03 MED ORDER — ROCURONIUM BROMIDE 100 MG/10ML IV SOLN
INTRAVENOUS | Status: DC | PRN
  Administered 2022-05-03: 70 mg via INTRAVENOUS

## 2022-05-03 MED ORDER — MIDAZOLAM HCL 5 MG/5ML IJ SOLN
INTRAMUSCULAR | Status: DC | PRN
  Administered 2022-05-03: 2 mg via INTRAVENOUS

## 2022-05-03 MED ORDER — SUGAMMADEX SODIUM 200 MG/2ML IV SOLN
INTRAVENOUS | Status: DC | PRN
  Administered 2022-05-03: 200 mg via INTRAVENOUS

## 2022-05-03 MED ORDER — ONDANSETRON HCL 4 MG/2ML IJ SOLN
INTRAMUSCULAR | Status: DC | PRN
  Administered 2022-05-03: 4 mg via INTRAVENOUS

## 2022-05-03 MED ORDER — PROPOFOL 10 MG/ML IV BOLUS
INTRAVENOUS | Status: DC | PRN
  Administered 2022-05-03: 130 mg via INTRAVENOUS

## 2022-05-03 MED ORDER — LIDOCAINE 2% (20 MG/ML) 5 ML SYRINGE
INTRAMUSCULAR | Status: DC | PRN
  Administered 2022-05-03: 60 mg via INTRAVENOUS

## 2022-05-03 MED ORDER — FENTANYL CITRATE (PF) 100 MCG/2ML IJ SOLN
INTRAMUSCULAR | Status: DC | PRN
  Administered 2022-05-03: 50 ug via INTRAVENOUS

## 2022-05-03 MED ORDER — LACTATED RINGERS IV SOLN: INTRAVENOUS | Status: DC

## 2022-05-03 SURGICAL SUPPLY — 1 items: superlock fiducial marker IMPLANT

## 2022-05-03 NOTE — Transfer of Care (Signed)
Immediate Anesthesia Transfer of Care Note  Patient: Emily Santiago  Procedure(s) Performed: ROBOTIC ASSISTED NAVIGATIONAL BRONCHOSCOPY (Right) BRONCHIAL BIOPSIES BRONCHIAL BRUSHINGS  Patient Location: PACU  Anesthesia Type:General  Level of Consciousness: drowsy and patient cooperative  Airway & Oxygen Therapy: Patient Spontanous Breathing and Patient connected to face mask oxygen  Post-op Assessment: Report given to RN and Post -op Vital signs reviewed and stable  Post vital signs: Reviewed and stable  Last Vitals:  Vitals Value Taken Time  BP 153/73 05/03/22 0950  Temp 36.4 C 05/03/22 0950  Pulse 76 05/03/22 0956  Resp 14 05/03/22 0956  SpO2 98 % 05/03/22 0956  Vitals shown include unvalidated device data.  Last Pain:  Vitals:   05/03/22 0950  TempSrc:   PainSc: 0-No pain         Complications: No notable events documented.

## 2022-05-03 NOTE — Interval H&P Note (Signed)
History and Physical Interval Note:  05/03/2022 8:40 AM  Emily Santiago  has presented today for surgery, with the diagnosis of RIGHT PULMNARY NODULE.  The various methods of treatment have been discussed with the patient and family. After consideration of risks, benefits and other options for treatment, the patient has consented to  Procedure(s): ROBOTIC ASSISTED NAVIGATIONAL BRONCHOSCOPY (Right) as a surgical intervention.  The patient's history has been reviewed, patient examined, no change in status, stable for surgery.  I have reviewed the patient's chart and labs.  Questions were answered to the patient's satisfaction.     [place]

## 2022-05-03 NOTE — H&P (Signed)
Expand All Collapse All    Patient ID: Emily Santiago, female    DOB: 10-Sep-1960, 61 y.o.   MRN: [id number]       Chief Complaint  Patient presents with   Follow-up      Pt is here due to sob and low oxygen levels noted. Pt has a hx of copd noted in chart. Pt states her sodium is low and issues noted with kidneys. CT scan last week and blood work done. Pt is on albuterol as needed      Referring provider: Alroy Dust, L.Marlou Sa, MD   HPI:    [name] is a 61 y.o. woman with PMH tobacco abuse, emphysema, HTN whom we are seeing in follow up of cough, DOE, lung nodule.    Patient last seen nearly 2 years ago.  Lost to follow-up.  At that time.  Reason for visit today is multifactorial.  Contacted need for pulmonary evaluation prior to colonoscopy for finding of lung nodule.  This does not make sense to me.  Please see below for further recommendations.   She has ongoing shortness of breath.  Cough.  When seen last 02/2020.  Concern for aspiration with dysphagia.  Possible asthma versus COPD.  Placed on Breo.  She does not remember much about taking his medication but does not think it helped much.  She still has cough.  Denies shortness of breath.  Possibly a bit worse over the last several months.  Her mom states she checks her oxygen at home and sometimes it is in the 24s.  Does not make sense based on her normal DLCO.  Notably, patient has fake nails which I suspect alter the reliability and accuracy of home oximeter.  After further questioning mom states sometimes it is even pick up which makes sense given her fake nails  Her and her mother seem to be focused on low sodium and kidney function.  I explained them this is not my area of expertise and should be continued to be worked up by her PCP and sounds like upcoming urology referral for gross hematuria. I reviewed with her in detail her PFTs in the fall 2021.  This shows no fixed obstruction, significant bronchodilator response and air  trapping consistent with asthma.  We discussed role and rationale for bronchodilators based on this result in terms of her dyspnea on exertion.   Lastly, recent lung cancer CT scan by her PCP.  This was personally reviewed and interpreted as small right lower lobe nodule, mild emphysematous changes in the apices, chronic left-sided nodule opacities, possible NTM disease versus sequela of aspiration.Marland Kitchen   HPI at initial visit: History obtained per patient and mother at bedside who frequently interjects.  Patient notes cough is actually been present for many years.  She thinks cough is not worsened over the last 6 months or so.  In the past, she attributed to her smoking.  She reportedly has had many episodes of bronchitis. She has been told she has COPD based on her symptoms and smoking. Never had PFTs. Requires prednisone everythime she has bronchitis. At least once a year.  2 courses this calendar year already to date.    Cough is daily. Worse when eating and drinking. Gets choked on water. Endorses sensation of solids getting stuck in throat. Cough worse when lying supine. Cough is non-productive. Endorses occasional heartburn despite PPI and H2 blocker. Endorses refractory reflux. Reports she has a hiatal hernia. Had curse of abx  with no improvement. Uses Breo and thinks helps cough mildly and gives a little more breath.She is an every day smoker. 2 ppd for about 40 years. Interested in quitting but seems more pre-contemplative at the moment.    Prior imaging reviewed and interpreted as: CXR 10/2019 - hyperinflated, no infiltrate; CT chest 03/2018 - emphysema, clear lungs.   05/03/2022: here today for planned bronchoscopy     PMH: HTN, GERD, emphysema Surgical history: Endoscopy, hysterectomy, cholecystectomy Family History: CAD, COPD in father, COPD in brother Social History: every day smoker, lives in [place] / Pulmonary Flowsheets:    ACT:        No data to display              MMRC:       No data to display             Epworth:        No data to display             Tests:    FENO:  Recent Labs  No results found for: "NITRICOXIDE"     PFT:     Latest Ref Rng & Units 03/26/2020    2:56 PM  PFT Results  FVC-Pre L 2.21   FVC-Predicted Pre % 74   FVC-Post L 2.49   FVC-Predicted Post % 84   Pre FEV1/FVC % % 70   Post FEV1/FCV % % 72   FEV1-Pre L 1.55   FEV1-Predicted Pre % 67   FEV1-Post L 1.78   DLCO uncorrected ml/min/mmHg 14.63   DLCO UNC% % 80   DLCO corrected ml/min/mmHg 14.63   DLCO COR %Predicted % 80   DLVA Predicted % 81   TLC L 4.73   TLC % Predicted % 102   RV % Predicted % 136   Personally reviewed and interpreted as spirometry digestive restriction versus air trapping, significant bronchodilator response after all.  Lung volumes within normal limits with the exception of evidence of air trapping.  DLCO within normal limits.   WALK:        No data to display             Imaging:  Imaging Results  CT CHEST LUNG CA SCREEN LOW DOSE W/O CM   Result Date: 12/29/2021 CLINICAL DATA:  Current smoker with 102.5 pack-year history EXAM: CT CHEST WITHOUT CONTRAST LOW-DOSE FOR LUNG CANCER SCREENING TECHNIQUE: Multidetector CT imaging of the chest was performed following the standard protocol without IV contrast. RADIATION DOSE REDUCTION: This exam was performed according to the departmental dose-optimization program which includes automated exposure control, adjustment of the mA and/or kV according to patient size and/or use of iterative reconstruction technique. COMPARISON:  Chest CT dated March 29, 2018; lung cancer screening CT dated September 15, 2015 FINDINGS: Cardiovascular: Normal heart size. No pericardial effusion. No visible coronary artery calcifications. Normal caliber thoracic aorta with mild calcified plaque. Mediastinum/Nodes: Small hiatal hernia. Thyroid is unremarkable. No pathologically enlarged lymph nodes  seen in the chest. Lungs/Pleura: Central airways are patent. Mild centrilobular emphysema. No consolidation, pleural effusion or pneumothorax. Part solid nodule of the right lower lobe measuring 8.5 mm in overall diameter with 4.7 mm solid component, new when compared with prior exams. Numerous small solid pulmonary of the posterior right upper lobe right middle lobe, new when compared with prior exams, largest measures 4 mm on image 160. Other previously seen nodules are stable. Upper Abdomen: Cholecystectomy clips.  No acute abnormality.  Musculoskeletal: Mild compression deformity of the T12 vertebral body, unchanged when compared to 2019 prior. No aggressive appearing osseous lesions. IMPRESSION: 1. New part solid nodule of the right lower lobe measuring 8.5 mm in overall diameter with 4.7 mm solid component. Recommend follow-up chest CT further evaluation. Lung-RADS 4A, suspicious. Follow up low-dose chest CT without contrast in 3 months (please use the following order, CT CHEST LCS NODULE FOLLOW-UP W/O CM) is recommended. Alternatively, PET may be considered when there is a solid component 31mm or larger. 2. Numerous new solid pulmonary nodules are seen in the right upper lobe and right middle lobe, likely sequela of infection or aspiration. Recommend attention on follow-up. 3. Aortic Atherosclerosis (ICD10-I70.0) and Emphysema (ICD10-J43.9). Electronically Signed   By: Yetta Glassman M.D.   On: 12/29/2021 20:24    DG Abd 2 Views   Result Date: 12/10/2021 CLINICAL DATA:  Abdominal pain.  Skip EXAM: ABDOMEN - 2 VIEW COMPARISON:  None Available. FINDINGS: The bowel gas pattern is normal. There is no evidence of free air. There are phleboliths in the pelvis. Cholecystectomy clips are present. No suspicious calcifications identified. Osseous structures are within normal limits. IMPRESSION: Nonobstructive, nonspecific bowel gas pattern. Electronically Signed   By: Ronney Asters M.D.   On: 12/10/2021 22:34     DG Chest 2 View   Result Date: 12/10/2021 CLINICAL DATA:  Patient with shortness of breath. Upper abdominal pain. EXAM: CHEST - 2 VIEW COMPARISON:  Chest radiograph 04/15/2021 FINDINGS: The heart size and mediastinal contours are within normal limits. Both lungs are clear. The visualized skeletal structures are unremarkable. IMPRESSION: No active cardiopulmonary disease. Electronically Signed   By: Lovey Newcomer M.D.   On: 12/10/2021 16:38       Lab Results: Personally reviewed CBC Labs (Brief)          Component Value Date/Time    WBC 7.8 12/30/2021 1416    RBC 4.16 12/30/2021 1416    HGB 12.6 12/30/2021 1416    HGB 13.4 04/22/2017 1407    HCT 37.0 12/30/2021 1416    HCT 40.5 04/22/2017 1407    PLT 247.0 12/30/2021 1416    PLT 219 04/22/2017 1407    MCV 89.1 12/30/2021 1416    MCV 95.1 04/22/2017 1407    MCH 31.2 10/22/2021 1157    MCHC 34.1 12/30/2021 1416    RDW 13.2 12/30/2021 1416    RDW 13.4 04/22/2017 1407    LYMPHSABS 1.8 12/30/2021 1416    LYMPHSABS 1.7 04/22/2017 1407    MONOABS 0.6 12/30/2021 1416    MONOABS 0.4 04/22/2017 1407    EOSABS 0.1 12/30/2021 1416    EOSABS 0.1 04/22/2017 1407    BASOSABS 0.0 12/30/2021 1416    BASOSABS 0.0 04/22/2017 1407        BMET Labs (Brief)          Component Value Date/Time    NA 126 (L) 12/30/2021 1416    NA 137 04/22/2017 1407    K 3.7 12/30/2021 1416    K 5.0 04/22/2017 1407    CL 90 (L) 12/30/2021 1416    CO2 24 12/30/2021 1416    CO2 24 04/22/2017 1407    GLUCOSE 83 12/30/2021 1416    GLUCOSE 77 04/22/2017 1407    BUN 34 (H) 12/30/2021 1416    BUN 21.6 04/22/2017 1407    CREATININE 0.80 12/30/2021 1416    CREATININE 0.8 04/22/2017 1407    CALCIUM 9.1 12/30/2021 1416    CALCIUM 9.5  04/22/2017 1407    GFRNONAA >60 10/22/2021 1157    GFRAA >60 12/31/2019 0938        BNP Labs (Brief)          Component Value Date/Time    BNP 16.5 02/18/2017 1659        ProBNP Labs (Brief)  No results found for:  "PROBNP"     Specialty Problems              Pulmonary Problems    Sore throat           Allergies  Allergen Reactions   Lisinopril Cough   Penicillins Nausea Only and Rash      Stomach upset Has patient had a PCN reaction causing immediate rash, facial/tongue/throat swelling, SOB or lightheadedness with hypotension: No Has patient had a PCN reaction causing severe rash involving mucus membranes or skin necrosis: Yes Has patient had a PCN reaction that required hospitalization: NO Has patient had a PCN reaction occurring within the last 10 years: Yes If all of the above answers are "NO", then may proceed with Cephalosporin use.              Immunization History  Administered Date(s) Administered   Influenza Split 02/13/2020   Influenza,inj,Quad PF,6+ Mos 06/01/2016, 03/15/2018, 04/15/2021   Influenza,inj,quad, With Preservative 05/17/2013   Moderna Sars-Covid-2 Vaccination 01/14/2020, 02/13/2020, 11/10/2020   PNEUMOCOCCAL CONJUGATE-20 05/20/2021   Pfizer Covid-19 Vaccine Bivalent Booster 58yrs & up 05/20/2021   Zoster, Live 10/14/2021          Past Medical History:  Diagnosis Date   Anxiety     Aortic atherosclerosis (HCC)     Arthritis      WRIST AND BACK   Atrophic vaginitis     CKD (chronic kidney disease), stage II     COPD (chronic obstructive pulmonary disease) ([place])     COVID-19     Depression     Diverticulosis     Emphysema/COPD (HCC)     Gastric ulcer     GERD (gastroesophageal reflux disease)     History of Barrett's esophagus     History of head and neck cancer     Hx of colonic polyps     Hyperlipidemia     Hypertension     Insomnia     Lymphoma ([place])      Lymphoma   Melanosis coli     Osteopenia     Osteoporosis     Plantar fasciitis     Tobacco abuse     Tubular adenoma of colon     Vallecular mass     Varicose veins     Vitamin D deficiency        Tobacco History: Social History        Tobacco Use  Smoking Status Former    Years: 39.00   Types: Cigarettes   Quit date: 2022   Years since quitting: 1.5  Smokeless Tobacco Never    Counseling given: Not Answered     Continue to not smoke       Outpatient Encounter Medications as of 01/06/2022  Medication Sig   ALBUTEROL SULFATE HFA IN Inhale 2 Inhalers into the lungs every 4 (four) hours as needed. 2 puffs prn   B Complex Vitamins (B-COMPLEX/B-12) TABS Take 1 tablet by mouth daily.   BUPROPION HCL ER, SR, PO Take 150 mg by mouth daily.   diclofenac (VOLTAREN) 50 MG EC tablet Take 50 mg by mouth 2 (two)  times daily as needed.   LORazepam (ATIVAN) 0.5 MG tablet Take 0.5 mg by mouth daily as needed for anxiety.   Multiple Vitamin (MULTIVITAMIN WITH MINERALS) TABS tablet Take 1 tablet by mouth daily.   ondansetron (ZOFRAN) 4 MG tablet Take 4 mg by mouth as needed.   potassium chloride (KLOR-CON M) 10 MEQ tablet Take 10 mEq by mouth every other day.   rosuvastatin (CRESTOR) 5 MG tablet Take 10 mg by mouth daily. 10 mg 3 X week   telmisartan (MICARDIS) 40 MG tablet Take 40 mg by mouth daily.   [DISCONTINUED] chlorthalidone (HYGROTON) 25 MG tablet Take 25 mg by mouth every morning.   [DISCONTINUED] esomeprazole (NEXIUM) 40 MG capsule Take 40 mg by mouth 2 (two) times daily.   [DISCONTINUED] zolpidem (AMBIEN) 10 MG tablet Take 5 mg by mouth at bedtime.     No facility-administered encounter medications on file as of 01/06/2022.        Review of Systems   Review of Systems  N/a   Physical Exam  BP (!) 144/75   Pulse 63   Temp 98.3 F (36.8 C) (Oral)   Resp 18   Ht 5\' 1"  (1.549 m)   Wt 61.2 kg   SpO2 95%   BMI 25.51 kg/m   General appearance: 61 y.o., female, NAD, conversant  Eyes: anicteric sclerae, moist conjunctivae; no lid-lag; PERRLA, tracking appropriately HENT: NCAT; oropharynx, MMM, no mucosal ulcerations; normal hard and soft palate Neck: Trachea midline; FROM, supple, lymphadenopathy, no JVD Lungs: CTAB, no crackles, no wheeze, with  normal respiratory effort and no intercostal retractions CV: RRR, S1, S2, no MRGs  Abdomen: Soft, non-tender; non-distended, BS present  Extremities: No peripheral edema, radial and DP pulses present bilaterally  Skin: Normal temperature, turgor and texture; no rash Psych: Appropriate affect Neuro: Alert and oriented to person and place, no focal deficit       Assessment & Plan:    Chronic Cough: Worsening. Suspect multifactorial. Suspect largest contributors are refractory GERD and aspiration with dysphagia as well as hiatal hernia contributing to refractory reflux. To continue Breo.  PFTs also consistent with possible asthma with significant bronchodilator response.  Encouraged ongoing GI evaluation and adherence to PPI.  Trial dual bronchodilator therapy as below for cough and shortness of breath.   Asthma: Based on bronchodilator response on PFT as well as air trapping.  Mild emphysema also present.  Given air trapping and reversibility, start Anoro 1 puff daily for dual bronchodilation.  Continue as needed albuterol.   Right lower lobe lung nodule: Repeat CT scan in 3 months for further evaluation, 8 mm GGO 4 mm solid component  Plan:  Here today for planned outpatient bronchoscopy  Discussed the R/B/A today at bedside with mother       Garner Nash, DO [facility] 05/03/2022 8:37 AM

## 2022-05-03 NOTE — Discharge Instructions (Signed)
Flexible Bronchoscopy, Care After This sheet gives you information about how to care for yourself after your test. Your doctor may also give you more specific instructions. If you have problems or questions, contact your doctor. Follow these instructions at home: Eating and drinking Do not eat or drink anything (not even water) for 2 hours after your test, or until your numbing medicine (local anesthetic) wears off. When your numbness is gone and your cough and gag reflexes have come back, you may: Eat only soft foods. Slowly drink liquids. The day after the test, go back to your normal diet. Driving Do not drive for 24 hours if you were given a medicine to help you relax (sedative). Do not drive or use heavy machinery while taking prescription pain medicine. General instructions  Take over-the-counter and prescription medicines only as told by your doctor. Return to your normal activities as told. Ask what activities are safe for you. Do not use any products that have nicotine or tobacco in them. This includes cigarettes and e-cigarettes. If you need help quitting, ask your doctor. Keep all follow-up visits as told by your doctor. This is important. It is very important if you had a tissue sample (biopsy) taken. Get help right away if: You have shortness of breath that gets worse. You get light-headed. You feel like you are going to pass out (faint). You have chest pain. You cough up: More than a little blood. More blood than before. Summary Do not eat or drink anything (not even water) for 2 hours after your test, or until your numbing medicine wears off. Do not use cigarettes. Do not use e-cigarettes. Get help right away if you have chest pain.  This information is not intended to replace advice given to you by your health care provider. Make sure you discuss any questions you have with your health care provider. Document Released: 03/28/2009 Document Revised: 05/13/2017 Document  Reviewed: 06/18/2016 Elsevier Patient Education  2020 [employer].

## 2022-05-03 NOTE — Op Note (Signed)
Video Bronchoscopy with Robotic Assisted Bronchoscopic Navigation   Date of Operation: 05/03/2022   Pre-op Diagnosis: RLL lung nodule   Post-op Diagnosis: RLL lung nodule   Surgeon: Garner Nash, DO   Assistants: None   Anesthesia: General endotracheal anesthesia  Operation: Flexible video fiberoptic bronchoscopy with robotic assistance and biopsies.  Estimated Blood Loss: Minimal  Complications: None  Indications and History: Emily Santiago is a 61 y.o. female with history of RLL lung nodule. The risks, benefits, complications, treatment options and expected outcomes were discussed with the patient.  The possibilities of pneumothorax, pneumonia, reaction to medication, pulmonary aspiration, perforation of a viscus, bleeding, failure to diagnose a condition and creating a complication requiring transfusion or operation were discussed with the patient who freely signed the consent.    Description of Procedure: The patient was seen in the Preoperative Area, was examined and was deemed appropriate to proceed.  The patient was taken to [facility] endoscopy room 3 , identified as Emily Santiago and the procedure verified as Flexible Video Fiberoptic Bronchoscopy.  A Time Out was held and the above information confirmed.   Prior to the date of the procedure a high-resolution CT scan of the chest was performed. Utilizing ION software program a virtual tracheobronchial tree was generated to allow the creation of distinct navigation pathways to the patient's parenchymal abnormalities. After being taken to the operating room general anesthesia was initiated and the patient  was orally intubated.  Prior to the start of the procedure the patient was placed in the left lateral decubitus position with the right lung up to help prevent right-sided atelectasis.  The video fiberoptic bronchoscope was introduced via the endotracheal tube and a general inspection was performed which showed normal right and left lung  anatomy, aspiration of the bilateral mainstems was completed to remove any remaining secretions. Robotic catheter inserted into patient's endotracheal tube.   Target #1 right lower lobe lung nodule: The distinct navigation pathways prepared prior to this procedure were then utilized to navigate to patient's lesion identified on CT scan. The robotic catheter was secured into place and the vision probe was withdrawn.  Lesion location was approximated using fluoroscopy and three-dimensional cone beam CT imaging. Under fluoroscopic guidance transbronchial needle brushings, transbronchial needle biopsies, and transbronchial forceps biopsies were performed to be sent for cytology and pathology.  Following tissue sampling a single fiducial was placed under fluoroscopic guidance using fiducial catheter wire and delivery kit.  At the end of the procedure a general airway inspection was performed and there was no evidence of active bleeding. The bronchoscope was removed.  The patient tolerated the procedure well. There was no significant blood loss and there were no obvious complications. A post-procedural chest x-ray is pending.  Samples Target #1: 1. Transbronchial needle brushings from RLL 2. Transbronchial forceps biopsies from RLL  Plans:  The patient will be discharged from the PACU to home when recovered from anesthesia and after chest x-ray is reviewed. We will review the cytology, pathology results with the patient when they become available. Outpatient followup will be with Garner Nash, DO.  Garner Nash, DO [facility] 05/03/2022 9:48 AM

## 2022-05-03 NOTE — Anesthesia Preprocedure Evaluation (Signed)
Anesthesia Evaluation  Patient identified by MRN, date of birth, ID band Patient awake    Reviewed: Allergy & Precautions, NPO status , Patient's Chart, lab work & pertinent test results  History of Anesthesia Complications Negative for: history of anesthetic complications  Airway Mallampati: II  TM Distance: >3 FB Neck ROM: Full    Dental  (+) Dental Advisory Given   Pulmonary asthma , COPD, former smoker R pulmonary nodule   Pulmonary exam normal        Cardiovascular hypertension, Pt. on medications negative cardio ROS Normal cardiovascular exam     Neuro/Psych   Anxiety Depression    negative neurological ROS     GI/Hepatic Neg liver ROS, PUD,GERD  ,,  Endo/Other  negative endocrine ROS    Renal/GU CRFRenal disease  negative genitourinary   Musculoskeletal  (+)  Fibromyalgia -  Abdominal   Peds  Hematology H/o lymphoma   Anesthesia Other Findings   Reproductive/Obstetrics                             Anesthesia Physical Anesthesia Plan  ASA: 3  Anesthesia Plan: General   Post-op Pain Management: Minimal or no pain anticipated   Induction: Intravenous  PONV Risk Score and Plan: 3 and Ondansetron, Dexamethasone, Treatment may vary due to age or medical condition and Midazolam  Airway Management Planned: Oral ETT  Additional Equipment: None  Intra-op Plan:   Post-operative Plan: Extubation in OR  Informed Consent: I have reviewed the patients History and Physical, chart, labs and discussed the procedure including the risks, benefits and alternatives for the proposed anesthesia with the patient or authorized representative who has indicated his/her understanding and acceptance.     Dental advisory given  Plan Discussed with:   Anesthesia Plan Comments:        Anesthesia Quick Evaluation

## 2022-05-03 NOTE — Anesthesia Postprocedure Evaluation (Signed)
Anesthesia Post Note  Patient: Emily Santiago  Procedure(s) Performed: ROBOTIC ASSISTED NAVIGATIONAL BRONCHOSCOPY (Right) BRONCHIAL BIOPSIES BRONCHIAL BRUSHINGS     Patient location during evaluation: PACU Anesthesia Type: General Level of consciousness: awake and alert Pain management: pain level controlled Vital Signs Assessment: post-procedure vital signs reviewed and stable Respiratory status: spontaneous breathing, nonlabored ventilation and respiratory function stable Cardiovascular status: blood pressure returned to baseline and stable Postop Assessment: no apparent nausea or vomiting Anesthetic complications: no   No notable events documented.  Last Vitals:  Vitals:   05/03/22 1015 05/03/22 1030  BP: (!) 145/77 133/78  Pulse: 70 69  Resp: 14 17  Temp:  36.4 C  SpO2: 91% 92%    Last Pain:  Vitals:   05/03/22 1030  TempSrc:   PainSc: 0-No pain                 Lidia Collum

## 2022-05-03 NOTE — Anesthesia Procedure Notes (Signed)
Procedure Name: Intubation Date/Time: 05/03/2022 8:56 AM  Performed by: Gwyndolyn Saxon, CRNAPre-anesthesia Checklist: Patient identified, Emergency Drugs available, Suction available and Patient being monitored Patient Re-evaluated:Patient Re-evaluated prior to induction Oxygen Delivery Method: Circle system utilized Preoxygenation: Pre-oxygenation with 100% oxygen Induction Type: IV induction Ventilation: Mask ventilation without difficulty Laryngoscope Size: Miller and 2 Grade View: Grade I Tube type: Oral Tube size: 8.5 mm Number of attempts: 1 Airway Equipment and Method: Stylet Placement Confirmation: ETT inserted through vocal cords under direct vision, positive ETCO2 and breath sounds checked- equal and bilateral Secured at: 21 cm Tube secured with: Tape Dental Injury: Teeth and Oropharynx as per pre-operative assessment

## 2022-05-05 ENCOUNTER — Telehealth: Payer: Self-pay | Admitting: Emergency Medicine

## 2022-05-05 DIAGNOSIS — C3491 Malignant neoplasm of unspecified part of right bronchus or lung: Secondary | ICD-10-CM

## 2022-05-05 LAB — CYTOLOGY - NON PAP

## 2022-05-05 NOTE — Telephone Encounter (Signed)
I spoke with the patient to review her bronchoscopy results.  Her small right lower lobe pulmonary nodule cytology was consistent with adenocarcinoma.  I think she is reasonable candidate for referral to discuss resection with thoracic surgery.  She agrees and wants to hear the options.  We will make that referral now  FYI to Dr Silas Flood

## 2022-05-07 ENCOUNTER — Encounter (HOSPITAL_COMMUNITY): Payer: Self-pay | Admitting: Pulmonary Disease

## 2022-05-11 ENCOUNTER — Institutional Professional Consult (permissible substitution): Payer: Commercial Managed Care - HMO | Admitting: Thoracic Surgery (Cardiothoracic Vascular Surgery)

## 2022-05-11 ENCOUNTER — Encounter: Payer: Self-pay | Admitting: Thoracic Surgery (Cardiothoracic Vascular Surgery)

## 2022-05-11 VITALS — BP 172/93 | HR 74 | Resp 18 | Ht 61.0 in | Wt 139.0 lb

## 2022-05-11 DIAGNOSIS — R911 Solitary pulmonary nodule: Secondary | ICD-10-CM | POA: Diagnosis not present

## 2022-05-11 DIAGNOSIS — C3490 Malignant neoplasm of unspecified part of unspecified bronchus or lung: Secondary | ICD-10-CM | POA: Diagnosis not present

## 2022-05-11 NOTE — Progress Notes (Signed)
Results of bronchoscopy was discussed with Dr. Lamonte Sakai via phone.  Patient was referred to Dr. Roxan Hockey for consideration of resection.  Thanks Rob for calling patient.  Thanks,  BLI  Garner Nash, DO [facility] 05/11/2022 2:31 PM

## 2022-05-11 NOTE — Progress Notes (Signed)
PCP is Alroy Dust, L.Marlou Sa, MD Referring Provider is Collene Gobble, MD  Chief Complaint  Patient presents with   Lung Cancer    Surgical consult/ PFT's 03/26/20, Bronch 05/03/22, Chest CT 03/31/22 and 12/28/21    HPI: Emily Santiago is sent for consultation regarding an adenocarcinoma of the right lower lobe.  Emily Santiago is a 61 year old woman with a I am doing fine thank She smoked 1 to 2 packs a day for about 40 years prior to quitting a year ago.  In July she had a low-dose CT for lung cancer screening which showed a 8.5 mm nodule with a 4.7 mm solid component.  She also had some tree-in-bud nodularity consistent with infection or aspiration.  A follow-up CT in October showed the nodule was stable in size.  She had a robotic bronchoscopy and biopsies of the lesion showed adenocarcinoma.  She is a very poor historian.  She alternates between saying she can walk 3 miles versus that she gets short of breath doing light chores around the house.  Has sharp left-sided chest pain when she bends over which lasts about 30 minutes.  Zubrod Score: At the time of surgery this patient's most appropriate activity status/level should be described as: []     0    Normal activity, no symptoms [x]     1    Restricted in physical strenuous activity but ambulatory, able to do out light work []     2    Ambulatory and capable of self care, unable to do work activities, up and about >50 % of waking hours                              []     3    Only limited self care, in bed greater than 50% of waking hours []     4    Completely disabled, no self care, confined to bed or chair []     5    Moribund  Past Medical History:  Diagnosis Date   ADHD (attention deficit hyperactivity disorder)    Allergies    Anxiety    Aortic atherosclerosis (HCC)    Arthritis    WRIST AND BACK   Atrophic vaginitis    CKD (chronic kidney disease), stage II    Complication of anesthesia    slow to wake up   COPD (chronic obstructive  pulmonary disease) ([place])    pt denies this dx   COVID 2022   states it was a bad case, not hospitalized   COVID-19    Depression    Diverticulosis    Emphysema/COPD ([place])    Family history of adverse reaction to anesthesia    mother was hard to wake up, acted weird afterwards   Fibromyalgia    Gastric ulcer    GERD (gastroesophageal reflux disease)    History of Barrett's esophagus    History of head and neck cancer    History of kidney stones    Hx of colonic polyps    Hyperlipidemia    Hypertension    Insomnia    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[employer])    Lymphoma   Melanosis coli    Osteopenia    Osteoporosis    Plantar fasciitis    Tobacco abuse    Tubular adenoma of colon    Vallecular mass    Varicose veins    Vitamin D deficiency     Past Surgical History:  Procedure  Laterality Date   ABDOMINAL HYSTERECTOMY     APPENDECTOMY     BILATERAL SALPINGOOPHORECTOMY     BIOPSY  02/25/2022   Procedure: BIOPSY;  Surgeon: Doran Stabler, MD;  Location: [facility];  Service: Gastroenterology;;   BRONCHIAL BIOPSY  05/03/2022   Procedure: BRONCHIAL BIOPSIES;  Surgeon: Garner Nash, DO;  Location: [facility];  Service: Pulmonary;;   BRONCHIAL BRUSHINGS  05/03/2022   Procedure: BRONCHIAL BRUSHINGS;  Surgeon: Garner Nash, DO;  Location: [facility];  Service: Pulmonary;;   CHOLECYSTECTOMY     COLONOSCOPY WITH PROPOFOL N/A 02/25/2022   Procedure: COLONOSCOPY WITH PROPOFOL;  Surgeon: Doran Stabler, MD;  Location: [facility];  Service: Gastroenterology;  Laterality: N/A;   ESOPHAGEAL MANOMETRY N/A 04/28/2016   Procedure: ESOPHAGEAL MANOMETRY (EM);  Surgeon: Mauri Pole, MD;  Location: [facility];  Service: Endoscopy;  Laterality: N/A;  dr. Loletha Carrow   ESOPHAGOGASTRODUODENOSCOPY (EGD) WITH PROPOFOL N/A 02/25/2022   Procedure: ESOPHAGOGASTRODUODENOSCOPY (EGD) WITH PROPOFOL;  Surgeon: Doran Stabler, MD;  Location: [facility];  Service: Gastroenterology;  Laterality: N/A;    KNEE ARTHROSCOPY     LYMPH NODE BIOPSY     TONSILLECTOMY     UPPER GASTROINTESTINAL ENDOSCOPY     WRIST SURGERY Right     Family History  Problem Relation Age of Onset   Heart disease Father    Colon cancer Father        dx in his 50's   Diabetes Father        metastatic   Colon polyps Father    Heart disease Maternal Grandmother    Breast cancer Maternal Grandmother    Prostate cancer Maternal Uncle    Brain cancer Brother    Liver cancer Brother    Bone cancer Brother    Prostate cancer Brother    Hyperlipidemia Mother    Colon polyps Mother    Breast cancer Cousin        x3   Stomach cancer Neg Hx    Pancreatic cancer Neg Hx    Esophageal cancer Neg Hx    Rectal cancer Neg Hx     Social History Social History   Tobacco Use   Smoking status: Former    Years: 39.00    Types: Cigarettes    Quit date: 2022    Years since quitting: 1.9   Smokeless tobacco: Never  Vaping Use   Vaping Use: Never used  Substance Use Topics   Alcohol use: No   Drug use: No    Current Outpatient Medications  Medication Sig Dispense Refill   amLODipine (NORVASC) 5 MG tablet Take 5 mg by mouth in the morning.     Aspirin-Acetaminophen-Caffeine (GOODY HEADACHE PO) Take 1-2 packets by mouth daily as needed (headaches/pain).     buPROPion (WELLBUTRIN SR) 150 MG 12 hr tablet Take 150 mg by mouth in the morning.     Cholecalciferol (DIALYVITE VITAMIN D 5000) 125 MCG (5000 UT) capsule Take 5,000 Units by mouth daily.     Cyanocobalamin (VITAMIN B-12 PO) Take 1 tablet by mouth in the morning.     esomeprazole (NEXIUM) 40 MG capsule Take 40 mg by mouth 2 (two) times daily.     fluticasone (FLONASE) 50 MCG/ACT nasal spray Place 1 spray into both nostrils daily as needed for allergies or rhinitis.     Multiple Vitamin (MULTIVITAMIN WITH MINERALS) TABS tablet Take 1 tablet by mouth in the morning.     rosuvastatin (CRESTOR)  10 MG tablet Take 10 mg by mouth every Monday, Wednesday, and Friday.  In the morning     salmeterol (SEREVENT DISKUS) 50 MCG/ACT diskus inhaler Inhale 1 puff into the lungs 2 (two) times daily. 1 each 4   zolpidem (AMBIEN) 10 MG tablet Take 10 mg by mouth at bedtime.     No current facility-administered medications for this visit.    Allergies  Allergen Reactions   Hydrocodone-Acetaminophen Anaphylaxis    Throat/tongue swells   Penicillins Nausea Only and Rash    Stomach upset Has patient had a PCN reaction causing immediate rash, facial/tongue/throat swelling, SOB or lightheadedness with hypotension: No Has patient had a PCN reaction causing severe rash involving mucus membranes or skin necrosis: Yes Has patient had a PCN reaction that required hospitalization: NO Has patient had a PCN reaction occurring within the last 10 years: Yes If all of the above answers are "NO", then may proceed with Cephalosporin use.     Zestril [Lisinopril] Cough   Latex Itching    Review of Systems  Constitutional:  Positive for activity change and unexpected weight change (Has lost 21 pounds in 3 months).  HENT:  Negative for trouble swallowing and voice change.   Respiratory:  Positive for cough and shortness of breath (+/-). Negative for wheezing.   Cardiovascular:  Positive for chest pain (see HPI).  Genitourinary:  Positive for frequency.  Hematological:  Bruises/bleeds easily.    BP (!) 172/93 (BP Location: Right Arm, Patient Position: Sitting)   Pulse 74   Resp 18   Ht 5\' 1"  (1.549 m)   Wt 139 lb (63 kg)   SpO2 93% Comment: RA  BMI 26.26 kg/m  Physical Exam Vitals reviewed.  Constitutional:      General: She is not in acute distress.    Appearance: Normal appearance.  HENT:     Head: Normocephalic and atraumatic.  Eyes:     General: No scleral icterus.    Extraocular Movements: Extraocular movements intact.  Cardiovascular:     Rate and Rhythm: Normal rate and regular rhythm.     Heart sounds: Normal heart sounds. No murmur heard.    No  friction rub. No gallop.  Pulmonary:     Effort: Pulmonary effort is normal. No respiratory distress.     Breath sounds: Normal breath sounds. No wheezing or rales.  Abdominal:     General: There is no distension.     Palpations: Abdomen is soft.     Tenderness: There is no abdominal tenderness.  Musculoskeletal:     Cervical back: Neck supple.  Lymphadenopathy:     Cervical: No cervical adenopathy.  Skin:    General: Skin is warm and dry.  Neurological:     General: No focal deficit present.     Mental Status: She is alert and oriented to person, place, and time.     Cranial Nerves: No cranial nerve deficit.    Diagnostic Tests: CT CHEST WITHOUT CONTRAST   TECHNIQUE: Multidetector CT imaging of the chest was performed using thin slice collimation for electromagnetic bronchoscopy planning purposes, without intravenous contrast.   RADIATION DOSE REDUCTION: This exam was performed according to the departmental dose-optimization program which includes automated exposure control, adjustment of the mA and/or kV according to patient size and/or use of iterative reconstruction technique.   COMPARISON:  12/28/2021 and 03/29/2018.   FINDINGS: Cardiovascular: Atherosclerotic calcification of the aorta, aortic valve and coronary arteries. Heart size normal. No pericardial effusion.   Mediastinum/Nodes:  Low left internal jugular lymph nodes are not enlarged by CT size criteria. No pathologically enlarged mediastinal or axillary lymph nodes. Esophagus is grossly unremarkable.   Lungs/Pleura: Centrilobular emphysema. Residual smoking related respiratory bronchiolitis. Peribronchovascular nodularity in the posterior segment right upper lobe has increased slightly in the interval. New peribronchovascular nodularity and nodular consolidation in the right middle lobe. 7 x 9 mm ground-glass nodule in the medial aspect of the right lower lobe (8/74), stable from 12/28/2021 (solid  component better measured on that study) but new from 03/29/2018. No pleural fluid. Airway is unremarkable.   Upper Abdomen: Visualized portions of the liver, adrenal glands, kidneys, spleen, pancreas, stomach and bowel are grossly unremarkable. Cholecystectomy. No upper abdominal adenopathy.   Musculoskeletal: Degenerative changes in the spine. No worrisome lytic or sclerotic lesions.   IMPRESSION: 1. 8 mm subsolid nodule in the medial right lower lobe, stable from 12/28/2021 but new from 03/29/2018, worrisome for adenocarcinoma. Recommend follow-up low-dose lung cancer screening CT in 6 months. Lesion is borderline in size for PET resolution. 2. New/increasing peribronchovascular nodularity and nodular consolidation in the right upper and right middle lobes, indicative of an infectious bronchiolitis. 3. Aortic atherosclerosis (ICD10-I70.0). Coronary artery calcification. 4.  Emphysema (ICD10-J43.9).     Electronically Signed   By: Lorin Picket M.D.   On: 04/02/2022 09:31 I personally reviewed the CT images.  There is a peripheral 7 x 9 mm mixed density nodule in the right lower lobe.  No adenopathy.  Peribronchovascular nodularity in right upper and middle lobes noted.  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  The gold standard for treatment for this prolonged time would have been a right lower lobectomy.  She has adequate pulmonary function to tolerate that.  However this is  a small peripheral nodule and recent data indicates that wedge or segmentectomy along with node dissection can achieve equivalent results.  Despite the fact that her pulmonary function looks more than adequate to help tolerate a lobectomy, her symptomatology is very difficult to piece together.  At times she complains of shortness of breath, but on further questioning it is really more of a deep breath or side.  She has some odd pains that she attributes to her lungs which are more likely chest wall in nature.  She does not have any pains that suggest angina.  I discussed with the patient and her family that there were 2 treatment options.  One would be surgical resection and the other would be stereotactic radiation.  They were very hung up on chemotherapy because she had to have chemotherapy for lymphoma in the past.  Despite multiple attempts to redirect they became very focused on that issue.  There is no indication for chemotherapy in her case.  I do think she needs a PET/CT the nodule is small and may or may not light up on the scan but since it has been proven to be an adenocarcinoma she needs a PET to complete her clinical staging.  She asked about a head MR as she has headaches but those are chronic  and not suggestive of metastatic disease.  There is no indication for MR currently.  I did offer her the option of a robotic right lower lobe wedge resection and node dissection.  We would need this lesion marked with robotic bronchoscopy by pulmonology prior to resection.  I informed her and her family of the general nature of the procedure.  They understand the need for general anesthesia, the incisions to be used, the use of the surgical robot, the possibility of conversion to open, the use of drains to postoperatively, the expected hospital stay, and the overall recovery.  I informed her of the indications, risks, benefits, and alternatives.  They understand the risks include, but not limited to  death, MI, DVT, PE, bleeding, possible need for transfusion, infection, prolonged air leak, cardiac arrhythmias, as well as the possibility of other unforeseeable complications.  If she chooses not to have surgery we will refer to radiation oncology.  If she would like to discuss radiation before making a decision we can go ahead and refer her as well.  Plan: She will call let us know if she would like to see radiation oncology or proceed with wedge resection and node dissection (combined procedure with pulmonary for marking)  I spent over an hour on review of records, images, and in consultation with Emily Santiago today. Emily Nakayama, MD [facility] [phone]

## 2022-05-12 ENCOUNTER — Ambulatory Visit (INDEPENDENT_AMBULATORY_CARE_PROVIDER_SITE_OTHER): Payer: Commercial Managed Care - HMO | Admitting: Pulmonary Disease

## 2022-05-12 ENCOUNTER — Other Ambulatory Visit: Payer: Self-pay | Admitting: Thoracic Surgery (Cardiothoracic Vascular Surgery)

## 2022-05-12 ENCOUNTER — Encounter: Payer: Self-pay | Admitting: Pulmonary Disease

## 2022-05-12 VITALS — BP 130/78 | HR 78 | Ht 59.0 in | Wt 138.2 lb

## 2022-05-12 DIAGNOSIS — C3491 Malignant neoplasm of unspecified part of right bronchus or lung: Secondary | ICD-10-CM

## 2022-05-12 DIAGNOSIS — C3490 Malignant neoplasm of unspecified part of unspecified bronchus or lung: Secondary | ICD-10-CM

## 2022-05-12 NOTE — Progress Notes (Signed)
Patient ID: Emily Santiago, female    DOB: Sep 09, 1960, 61 y.o.   MRN: [id number]  Chief Complaint  Patient presents with   Follow-up    Follow up for chronic cough and lung nodule. Pt states she was having headaches with the anoro. Pt had chest xray, bronch and labs done 11/20. Pt had appt with surgeon yesterday. Pt is on servent diskus and so far no issues other then cost.     Referring provider: Alroy Dust, L.Marlou Sa, MD  HPI:   Emily Santiago is a 61 y.o. woman with PMH tobacco abuse, emphysema, HTN whom we are seeing in follow up of cough, DOE, lung nodule with diagnosis of adenocarcinoma following robotic navigational bronchoscopy 05/03/2022.  Operative report reviewed.  Recent cardiothoracic surgery office note reviewed.  She returns for routine follow-up.  We had a repeat CT scan in the interim.  This showed small but interval growth in right lower lobe nodule.  Groundglass component.  Discussed 58-month repeat scan versus moving forward bronchoscopy.  After discussion with colleagues, and reported with navigational bronchoscopy.  This did demonstrate adenocarcinoma of the lung.  No evidence of lymphadenopathy during the case.  She was referred to thoracic surgery.  Note congested.  I recommended resection.  Discussed radiation therapy.  She seemed unsure of what to do.  I recommended a PET scan.  I discussed with her the options.  The 3 options are surgical resection, radiation, or do nothing.  Obviously do nothing the cancer will grow and likely spread and ultimately cause her demise or death.  She states she did not will have radiation therapy because she want to be able to swallow and not have dry mouth.  Discussed with her that usually head and neck cancer and esophageal cancer and not with radiation to the chest.  Regardless, she states she does not want radiation.  So that leaves the option of resection.  Discussed at length and encouraged her to move forward with surgical resection of  her curable lung cancer.  I instructed her to call Dr. Roxan Hockey and let him know that she like to move forward soon as possible.  HPI at initial visit: History obtained per patient and mother at bedside who frequently interjects.  Patient notes cough is actually been present for many years.  She thinks cough is not worsened over the last 6 months or so.  In the past, she attributed to her smoking.  She reportedly has had many episodes of bronchitis. She has been told she has COPD based on her symptoms and smoking. Never had PFTs. Requires prednisone everythime she has bronchitis. At least once a year.  2 courses this calendar year already to date.   Cough is daily. Worse when eating and drinking. Gets choked on water. Endorses sensation of solids getting stuck in throat. Cough worse when lying supine. Cough is non-productive. Endorses occasional heartburn despite PPI and H2 blocker. Endorses refractory reflux. Reports she has a hiatal hernia. Had curse of abx with no improvement. Uses Breo and thinks helps cough mildly and gives a little more breath.  She is an every day smoker. 2 ppd for about 40 years. Interested in quitting but seems more pre-contemplative at the moment.   Prior imaging reviewed and interpreted as: CXR 10/2019 - hyperinflated, no infiltrate; CT chest 03/2018 - emphysema, clear lungs.   PMH: HTN, GERD, emphysema Surgical history: Endoscopy, hysterectomy, cholecystectomy Family History: CAD, COPD in father, COPD in brother Social History: every day  smoker, lives in [place] / Pulmonary Flowsheets:   ACT:      No data to display           MMRC:     No data to display           Epworth:      No data to display           Tests:   FENO:  No results found for: "NITRICOXIDE"  PFT:    Latest Ref Rng & Units 03/26/2020    2:56 PM  PFT Results  FVC-Pre L 2.21   FVC-Predicted Pre % 74   FVC-Post L 2.49   FVC-Predicted Post % 84    Pre FEV1/FVC % % 70   Post FEV1/FCV % % 72   FEV1-Pre L 1.55   FEV1-Predicted Pre % 67   FEV1-Post L 1.78   DLCO uncorrected ml/min/mmHg 14.63   DLCO UNC% % 80   DLCO corrected ml/min/mmHg 14.63   DLCO COR %Predicted % 80   DLVA Predicted % 81   TLC L 4.73   TLC % Predicted % 102   RV % Predicted % 136   Personally reviewed and interpreted as spirometry digestive restriction versus air trapping, significant bronchodilator response after all.  Lung volumes within normal limits with the exception of evidence of air trapping.  DLCO within normal limits.  WALK:      No data to display           Imaging: Personally reviewed and as per EMR discussion this note DG Chest Port 1 View  Result Date: 05/03/2022 CLINICAL DATA:  Status post bronchoscopy and right lower lobe biopsy EXAM: PORTABLE CHEST 1 VIEW COMPARISON:  Chest radiograph 12/09/2021 and CT chest 03/31/2022 FINDINGS: The cardiomediastinal silhouette is stable. A biopsy marker is seen projecting over the right lower lobe. There is no evidence of complication following biopsy. There is no pneumothorax. There is no focal consolidation or pulmonary edema. There is no pleural effusion There is no acute osseous abnormality. Right upper quadrant surgical clips are noted. IMPRESSION: No evidence of complication following right lower lobe biopsy. Electronically Signed   By: Valetta Mole M.D.   On: 05/03/2022 10:23   DG C-ARM BRONCHOSCOPY  Result Date: 05/03/2022 C-ARM BRONCHOSCOPY: Fluoroscopy was utilized by the requesting physician.  No radiographic interpretation.    Lab Results: Personally reviewed CBC    Component Value Date/Time   WBC 4.9 05/03/2022 0736   RBC 3.98 05/03/2022 0736   HGB 12.2 05/03/2022 0736   HGB 13.4 04/22/2017 1407   HCT 37.4 05/03/2022 0736   HCT 40.5 04/22/2017 1407   PLT 192 05/03/2022 0736   PLT 219 04/22/2017 1407   MCV 94.0 05/03/2022 0736   MCV 95.1 04/22/2017 1407   MCH 30.7 05/03/2022  0736   MCHC 32.6 05/03/2022 0736   RDW 13.0 05/03/2022 0736   RDW 13.4 04/22/2017 1407   LYMPHSABS 1.8 12/30/2021 1416   LYMPHSABS 1.7 04/22/2017 1407   MONOABS 0.6 12/30/2021 1416   MONOABS 0.4 04/22/2017 1407   EOSABS 0.1 12/30/2021 1416   EOSABS 0.1 04/22/2017 1407   BASOSABS 0.0 12/30/2021 1416   BASOSABS 0.0 04/22/2017 1407    BMET    Component Value Date/Time   NA 141 05/03/2022 0736   NA 137 04/22/2017 1407   K 3.7 05/03/2022 0736   K 5.0 04/22/2017 1407   CL 106 05/03/2022 0736   CO2 22 05/03/2022 0736   CO2  24 04/22/2017 1407   GLUCOSE 83 05/03/2022 0736   GLUCOSE 77 04/22/2017 1407   BUN 10 05/03/2022 0736   BUN 21.6 04/22/2017 1407   CREATININE 0.51 05/03/2022 0736   CREATININE 0.8 04/22/2017 1407   CALCIUM 9.1 05/03/2022 0736   CALCIUM 9.5 04/22/2017 1407   GFRNONAA >60 05/03/2022 0736   GFRAA >60 12/31/2019 0938    BNP    Component Value Date/Time   BNP 16.5 02/18/2017 1659    ProBNP No results found for: "PROBNP"  Specialty Problems       Pulmonary Problems   Sore throat   Lung nodule    Allergies  Allergen Reactions   Hydrocodone-Acetaminophen Anaphylaxis    Throat/tongue swells   Penicillins Nausea Only and Rash    Stomach upset Has patient had a PCN reaction causing immediate rash, facial/tongue/throat swelling, SOB or lightheadedness with hypotension: No Has patient had a PCN reaction causing severe rash involving mucus membranes or skin necrosis: Yes Has patient had a PCN reaction that required hospitalization: NO Has patient had a PCN reaction occurring within the last 10 years: Yes If all of the above answers are "NO", then may proceed with Cephalosporin use.     Zestril [Lisinopril] Cough   Latex Itching    Immunization History  Administered Date(s) Administered   Influenza Split 02/13/2020   Influenza,inj,Quad PF,6+ Mos 06/01/2016, 03/15/2018, 04/15/2021   Influenza,inj,quad, With Preservative 05/17/2013   Moderna  Sars-Covid-2 Vaccination 01/14/2020, 02/13/2020, 11/10/2020   PNEUMOCOCCAL CONJUGATE-20 05/20/2021   Pfizer Covid-19 Vaccine Bivalent Booster 57yrs & up 05/20/2021   Zoster, Live 10/14/2021    Past Medical History:  Diagnosis Date   ADHD (attention deficit hyperactivity disorder)    Allergies    Anxiety    Aortic atherosclerosis (HCC)    Arthritis    WRIST AND BACK   Atrophic vaginitis    CKD (chronic kidney disease), stage II    Complication of anesthesia    slow to wake up   COPD (chronic obstructive pulmonary disease) ([place])    pt denies this dx   COVID 2022   states it was a bad case, not hospitalized   COVID-19    Depression    Diverticulosis    Emphysema/COPD ([place])    Family history of adverse reaction to anesthesia    mother was hard to wake up, acted weird afterwards   Fibromyalgia    Gastric ulcer    GERD (gastroesophageal reflux disease)    History of Barrett's esophagus    History of head and neck cancer    History of kidney stones    Hx of colonic polyps    Hyperlipidemia    Hypertension    Insomnia    Lymphoma ([place])    Lymphoma   Melanosis coli    Osteopenia    Osteoporosis    Plantar fasciitis    Tobacco abuse    Tubular adenoma of colon    Vallecular mass    Varicose veins    Vitamin D deficiency     Tobacco History: Social History   Tobacco Use  Smoking Status Former   Years: 39.00   Types: Cigarettes   Quit date: 2022   Years since quitting: 1.9  Smokeless Tobacco Never   Counseling given: Not Answered   Continue to not smoke  Outpatient Encounter Medications as of 05/12/2022  Medication Sig   amLODipine (NORVASC) 5 MG tablet Take 5 mg by mouth in the morning.   Aspirin-Acetaminophen-Caffeine (GOODY HEADACHE PO)  Take 1-2 packets by mouth daily as needed (headaches/pain).   buPROPion (WELLBUTRIN SR) 150 MG 12 hr tablet Take 150 mg by mouth in the morning.   Cholecalciferol (DIALYVITE VITAMIN D 5000) 125 MCG (5000 UT) capsule Take  5,000 Units by mouth daily.   Cyanocobalamin (VITAMIN B-12 PO) Take 1 tablet by mouth in the morning.   esomeprazole (NEXIUM) 40 MG capsule Take 40 mg by mouth 2 (two) times daily.   fluticasone (FLONASE) 50 MCG/ACT nasal spray Place 1 spray into both nostrils daily as needed for allergies or rhinitis.   Multiple Vitamin (MULTIVITAMIN WITH MINERALS) TABS tablet Take 1 tablet by mouth in the morning.   rosuvastatin (CRESTOR) 10 MG tablet Take 10 mg by mouth every Monday, Wednesday, and Friday. In the morning   salmeterol (SEREVENT DISKUS) 50 MCG/ACT diskus inhaler Inhale 1 puff into the lungs 2 (two) times daily.   traZODone (DESYREL) 50 MG tablet Take 50 mg by mouth at bedtime.   zolpidem (AMBIEN) 10 MG tablet Take 10 mg by mouth at bedtime.   No facility-administered encounter medications on file as of 05/12/2022.     Review of Systems  Review of Systems  N/a  Physical Exam  BP 130/78 (BP Location: Left Arm, Patient Position: Sitting, Cuff Size: Normal)   Pulse 78   Ht 4\' 11"  (1.499 m)   Wt 138 lb 3.2 oz (62.7 kg)   SpO2 98%   BMI 27.91 kg/m   Wt Readings from Last 5 Encounters:  05/12/22 138 lb 3.2 oz (62.7 kg)  05/11/22 139 lb (63 kg)  05/03/22 135 lb (61.2 kg)  02/25/22 141 lb (64 kg)  01/06/22 149 lb 3.2 oz (67.7 kg)    BMI Readings from Last 5 Encounters:  05/12/22 27.91 kg/m  05/11/22 26.26 kg/m  05/03/22 25.51 kg/m  02/25/22 26.64 kg/m  01/06/22 28.19 kg/m     Physical Exam General: Well appearing, in NAD Eyes: EOMI, no icterus ENT: No JVP appreciated, voice is mildly hoarse Respiratory: Distant sounds, no wheeze CV: RRR, no murmurs Abdomen/GI: soft, BS present MSK: No synovitis, no joint effusions Neuro: Normal gait, no weakness Psych: Normal mood, flat affect   Assessment & Plan:   Chronic Cough: Worsening. Suspect multifactorial. Suspect largest contributors are refractory GERD and aspiration with dysphagia as well as hiatal hernia  contributing to refractory reflux.  PFTs also consistent with possible asthma with significant bronchodilator response.  Continue Serevent..  Asthma: Based on bronchodilator response on PFT as well as air trapping.  Mild emphysema also present.  Did not tolerate Breo. Reported headache with Anoro and Incruse, possible LAMA side effect.  However I think these headaches are chronic and not related.  She is tolerating LABA therapy, Serevent.  To continue.  Continue as needed albuterol.  Right lower lobe lung nodule, adenocarcinoma of lung: Recent CT surgery eval. Has PET scheduled. Recommended resection. She states she does not want radiation if surgery is an option.    Return in about 3 months (around 08/12/2022).   Lanier Clam, MD 05/12/2022  I spent 48 minutes in the care of the patient including face-to-face visit, review of records, coordination of care.

## 2022-05-12 NOTE — Patient Instructions (Addendum)
Nice to see you  The PET scan is scheduled: 05/31/2022  9:00 AM at [facility] the Serovent inhaler   Return to clinic in 3 months or sooner as needed

## 2022-05-13 ENCOUNTER — Telehealth: Payer: Self-pay | Admitting: Pulmonary Disease

## 2022-05-13 NOTE — Telephone Encounter (Signed)
Called patient and she informed us of the PET scan date. I told her I would update Dr Archie Patten about this. She is also wanting to know about disability. I informed patient I would ask Dr Silas Flood about this and call her back with an update. Nothing further needed

## 2022-05-21 ENCOUNTER — Other Ambulatory Visit: Payer: Self-pay

## 2022-05-21 NOTE — Progress Notes (Signed)
The proposed treatment discussed in conference is for discussion purpose only and is not a binding recommendation.  The patients have not been physically examined, or presented with their treatment options.  Therefore, final treatment plans cannot be decided.  

## 2022-05-27 ENCOUNTER — Ambulatory Visit (HOSPITAL_COMMUNITY)
Admission: RE | Admit: 2022-05-27 | Discharge: 2022-05-27 | Disposition: A | Discharge status: Home / Self Care | Payer: Commercial Managed Care - HMO | Source: Ambulatory Visit | Attending: Thoracic Surgery (Cardiothoracic Vascular Surgery) | Admitting: Thoracic Surgery (Cardiothoracic Vascular Surgery)

## 2022-05-27 DIAGNOSIS — C3490 Malignant neoplasm of unspecified part of unspecified bronchus or lung: Secondary | ICD-10-CM | POA: Insufficient documentation

## 2022-05-27 DIAGNOSIS — R911 Solitary pulmonary nodule: Secondary | ICD-10-CM | POA: Diagnosis present

## 2022-05-27 LAB — GLUCOSE, CAPILLARY: Glucose-Capillary: 86 mg/dL (ref 70–99)

## 2022-05-27 MED ORDER — FLUDEOXYGLUCOSE F - 18 (FDG) INJECTION
6.5000 | Freq: Once | INTRAVENOUS | Status: AC
  Administered 2022-05-27: 6.69 via INTRAVENOUS

## 2022-05-28 ENCOUNTER — Telehealth: Payer: Self-pay | Admitting: *Deleted

## 2022-05-28 NOTE — Telephone Encounter (Signed)
Patient contacted the office requesting results of PET scan from yesterday. Per Dr. Roxan Hockey, patient advised PET does not show any evidence of spread. Per Dr. Roxan Hockey, patient offered follow up appt to further discuss surgery or referral to radiation oncology. Patient would like area of concern removed so follow up appt scheduled for patient. Patient verbalized understanding.

## 2022-05-31 ENCOUNTER — Encounter (HOSPITAL_COMMUNITY): Payer: Commercial Managed Care - HMO

## 2022-06-17 ENCOUNTER — Ambulatory Visit: Payer: Commercial Managed Care - HMO | Admitting: Thoracic Surgery (Cardiothoracic Vascular Surgery)

## 2022-06-17 ENCOUNTER — Encounter: Payer: Self-pay | Admitting: Thoracic Surgery (Cardiothoracic Vascular Surgery)

## 2022-06-17 VITALS — BP 146/84 | HR 80 | Resp 20 | Wt 138.0 lb

## 2022-06-17 DIAGNOSIS — C3491 Malignant neoplasm of unspecified part of right bronchus or lung: Secondary | ICD-10-CM | POA: Diagnosis not present

## 2022-06-17 NOTE — Progress Notes (Signed)
[address]       [place] [address]             [phone]     HPI: Emily Santiago returns to discuss resection of a right lower lobe adenocarcinoma.  Emily Santiago is a 62 year old woman with a past medical history significant for tobacco abuse, COPD, COVID-19, aortic atherosclerosis, stage III chronic kidney disease, lymphoma, reflux, Barrett's esophagus, fibromyalgia, and osteoporosis.  Recently diagnosed with a clinical stage Ia adenocarcinoma of the right lower lobe diagnosed by navigational bronchoscopy.  I saw her a few weeks ago.  She had a very unclear history.  I think she was extremely anxious that day.  She now returns to further discuss surgical resection.  She has thought over the options of surgery versus stereotactic radiation.  She strongly prefers surgery.  She can walk 2 to 3 miles without stopping on level ground.  Still has a sharp left-sided chest pain when she bends over from time to time but no anginal type chest pain.  She has lost 20 pounds in 3 months.  Also complains of cough and shortness of breath with heavy exertion.  Zubrod Score: At the time of surgery this patient's most appropriate activity status/level should be described as: _0     0    Normal activity, no symptoms _1     1    Restricted in physical strenuous activity but ambulatory, able to do out light work _2     2    Ambulatory and capable of self care, unable to do work activities, up and about >50 % of waking hours                              _3     3    Only limited self care, in bed greater than 50% of waking hours _4     4    Completely disabled, no self care, confined to bed or chair _5     5    Moribund  Past Medical History:  Diagnosis Date   ADHD (attention deficit hyperactivity disorder)    Allergies    Anxiety    Aortic atherosclerosis (HCC)    Arthritis    WRIST AND BACK   Atrophic vaginitis    CKD (chronic kidney disease), stage II    Complication of anesthesia     slow to wake up   COPD (chronic obstructive pulmonary disease) ([place])    pt denies this dx   COVID 2022   states it was a bad case, not hospitalized   COVID-19    Depression    Diverticulosis    Emphysema/COPD ([place])    Family history of adverse reaction to anesthesia    mother was hard to wake up, acted weird afterwards   Fibromyalgia    Gastric ulcer    GERD (gastroesophageal reflux disease)    History of Barrett's esophagus    History of head and neck cancer    History of kidney stones    Hx of colonic polyps    Hyperlipidemia    Hypertension    Insomnia    Lymphoma ([place])    Lymphoma   Melanosis coli    Osteopenia    Osteoporosis    Plantar fasciitis    Tobacco abuse    Tubular adenoma of colon    Vallecular mass    Varicose veins    Vitamin D deficiency  Current Outpatient Medications  Medication Sig Dispense Refill   amLODipine (NORVASC) 5 MG tablet Take 5 mg by mouth in the morning.     Aspirin-Acetaminophen-Caffeine (GOODY HEADACHE PO) Take 1-2 packets by mouth daily as needed (headaches/pain).     buPROPion (WELLBUTRIN SR) 150 MG 12 hr tablet Take 150 mg by mouth in the morning.     Cholecalciferol (DIALYVITE VITAMIN D 5000) 125 MCG (5000 UT) capsule Take 5,000 Units by mouth daily.     Cyanocobalamin (VITAMIN B-12 PO) Take 1 tablet by mouth in the morning.     esomeprazole (NEXIUM) 40 MG capsule Take 40 mg by mouth 2 (two) times daily.     fluticasone (FLONASE) 50 MCG/ACT nasal spray Place 1 spray into both nostrils daily as needed for allergies or rhinitis.     Multiple Vitamin (MULTIVITAMIN WITH MINERALS) TABS tablet Take 1 tablet by mouth in the morning.     rosuvastatin (CRESTOR) 10 MG tablet Take 10 mg by mouth every Monday, Wednesday, and Friday. In the morning     salmeterol (SEREVENT DISKUS) 50 MCG/ACT diskus inhaler Inhale 1 puff into the lungs 2 (two) times daily. 1 each 4   traZODone (DESYREL) 50 MG tablet Take 50 mg by mouth at bedtime.      zolpidem (AMBIEN) 10 MG tablet Take 10 mg by mouth at bedtime.     No current facility-administered medications for this visit.    Physical Exam BP (!) 146/84 (BP Location: Left Arm, Patient Position: Sitting, Cuff Size: Normal)   Pulse 80   Resp 20   Wt 138 lb (62.6 kg)   SpO2 97% Comment: RA  BMI 27.68 kg/m  62 year old woman in no acute distress Well-developed and well-nourished HEENT unremarkable, wearing surgical mask No cervical she will look for adenopathy Lungs clear Cardiac regular rate and rhythm with no rub or murmur No peripheral edema  Diagnostic Tests: NUCLEAR MEDICINE PET SKULL BASE TO THIGH   TECHNIQUE: 6.69 mCi F-18 FDG was injected intravenously. Full-ring PET imaging was performed from the skull base to thigh after the radiotracer. CT data was obtained and used for attenuation correction and anatomic localization.   Fasting blood glucose: 86 mg/dl   COMPARISON:  CT scan 03/31/2022   FINDINGS: Mediastinal blood pool activity: SUV max 2.04   Liver activity: SUV max NA   NECK: No hypermetabolic lymph nodes in the neck.   Incidental CT findings: None.   CHEST: Small ground-glass nodule in the right lower lobe with nearby fiducial related to recent biopsy. No hypermetabolism. SUV max is 1.41 well below that of mediastinal background activity. No enlarged or hypermetabolic mediastinal or hilar lymph nodes to suggest metastatic adenopathy.   No hypermetabolic breast masses, supraclavicular or axillary adenopathy.   Incidental CT findings: Stable peripheral tree-in-bud type nodularity in the right lung, likely chronic inflammation or atypical infection such as MAC. No new pulmonary lesions or nodules. No pleural effusions.   ABDOMEN/PELVIS: No abnormal hypermetabolic activity within the liver, pancreas, adrenal glands, or spleen. No hypermetabolic lymph nodes in the abdomen or pelvis.   Incidental CT findings: Stable scattered vascular  calcifications. Status post cholecystectomy. Small hiatal hernia.   SKELETON: No focal hypermetabolic activity to suggest skeletal metastasis.   Incidental CT findings: None.   IMPRESSION: 1. Small ground-glass nodule in the right lower lobe does not show any hypermetabolism. Nearby fiducials noted. 2. No findings for mediastinal/hilar adenopathy or metastatic disease involving the abdomen/pelvis or bony structures.     Electronically  Signed   By: Marijo Sanes M.D.   On: 05/28/2022 11:36 I personally reviewed the PET/CT images.  There is mild activity in the groundglass nodule in the right lower lobe with an SUV of 1.4.  No evidence of regional or distant metastatic disease.  Consistent with clinical stage Ia (T1, N0) adenocarcinoma.  Pulmonary function testing 03/26/2020 FVC 2.21 (74%) FEV1 1.55 (67%) FEV1 1.78 (78%) postbronchodilator DLCO 14.63 (80%)  Impression: Emily Santiago is a 62 year old woman with a past medical history significant for tobacco abuse, COPD, COVID-19, aortic atherosclerosis, stage III chronic kidney disease, lymphoma, reflux, Barrett's esophagus, fibromyalgia, osteoporosis, and a newly diagnosed stage Ia adenocarcinoma of the right lower lobe.   We again discussed the treatment options including surgical resection and stereotactic radiation.  She strongly prefers surgery.  I described the proposed operative procedure to her.  The plan would be to do a robotic assisted right lower lobe segmentectomy, but she understands there is a possibility we might have to do a lobectomy depending on intraoperative findings.  Given the small size of the nodule and its groundglass nature we need to have that marked prior to surgery to ensure we can get an adequate margin.  Will see if we can arrange that with Dr. Valeta Harms since he did her navigational bronchoscopy previously.  She understands the need for general anesthesia, the incisions to be used, the use of the surgical  robot, the use of a drainage tube postoperatively, the expected hospital stay, and the overall recovery.  I informed her of the indications, risk, benefits, and alternatives.  She understands the risks include, but are not limited to death, MI, DVT, PE, bleeding, possible need for transfusion, infection, cardiac arrhythmias, as well as the possibility of other unforeseeable complications.  She accepts the risk and agrees to proceed.  Plan: Will arrange for robotic bronchoscopy for marking prior to robotic right lower lobe segmentectomy versus lobectomy.  Melrose Nakayama, MD [facility] [phone]

## 2022-06-21 ENCOUNTER — Other Ambulatory Visit: Payer: Self-pay | Admitting: *Deleted

## 2022-06-21 ENCOUNTER — Telehealth: Payer: Self-pay | Admitting: Student

## 2022-06-21 DIAGNOSIS — R911 Solitary pulmonary nodule: Secondary | ICD-10-CM

## 2022-06-21 DIAGNOSIS — C3491 Malignant neoplasm of unspecified part of right bronchus or lung: Secondary | ICD-10-CM

## 2022-06-21 NOTE — Telephone Encounter (Signed)
Pt has been scheduled for CT on 1/17.  I spoke to pt & gave her appt info.  Nothing further needed.

## 2022-06-21 NOTE — Telephone Encounter (Signed)
CT Super D ordered for navigation planning. Must be scheduled before 07/02/22.

## 2022-06-22 ENCOUNTER — Encounter: Payer: Self-pay | Admitting: *Deleted

## 2022-06-28 NOTE — Progress Notes (Signed)
Surgical Instructions    Your procedure is scheduled on Friday, 07/02/22.  Report to [facility] Main Entrance "A" at 5:30 A.M., then check in with the Admitting office.  Call this number if you have problems the morning of surgery:  [phone]   If you have any questions prior to your surgery date call [phone]: Open Monday-Friday 8am-4pm If you experience any cold or flu symptoms such as cough, fever, chills, shortness of breath, etc. between now and your scheduled surgery, please notify us at the above number     Remember:  Do not eat or drink after midnight the night before your surgery     Take these medicines the morning of surgery with A SIP OF WATER:  amLODipine (NORVASC)  buPROPion (WELLBUTRIN SR)  esomeprazole (NEXIUM)  rosuvastatin (CRESTOR)  salmeterol (SEREVENT DISKUS)   IF NEEDED: fluticasone (FLONASE)  LORazepam (ATIVAN)   As of today, STOP taking any Aspirin (unless otherwise instructed by your surgeon) Aleve, Naproxen, Ibuprofen, Motrin, Advil, Goody's, BC's, all herbal medications, fish oil, diclofenac (VOLTAREN) and all vitamins.           Do not wear jewelry or makeup. Do not wear lotions, powders, perfumes or deodorant. Do not shave 48 hours prior to surgery.  Do not bring valuables to the hospital. Do not wear nail polish, gel polish, artificial nails, or any other type of covering on natural nails (fingers and toes) If you have artificial nails or gel coating that need to be removed by a nail salon, please have this removed prior to surgery. Artificial nails or gel coating may interfere with anesthesia's ability to adequately monitor your vital signs.  [facility] is not responsible for any belongings or valuables.    Do NOT Smoke (Tobacco/Vaping)  24 hours prior to your procedure  If you use a CPAP at night, you may bring your mask for your overnight stay.   Contacts, glasses, hearing aids, dentures or partials may not be worn into surgery,  please bring cases for these belongings   For patients admitted to the hospital, discharge time will be determined by your treatment team.   Patients discharged the day of surgery will not be allowed to drive home, and someone needs to stay with them for 24 hours.   SURGICAL WAITING ROOM VISITATION Patients having surgery or a procedure may have no more than 2 support people in the waiting area - these visitors may rotate.   Children under the age of 37 must have an adult with them who is not the patient. If the patient needs to stay at the hospital during part of their recovery, the visitor guidelines for inpatient rooms apply. Pre-op nurse will coordinate an appropriate time for 1 support person to accompany patient in pre-op.  This support person may not rotate.   Please refer to [email or web address] for the visitor guidelines for Inpatients (after your surgery is over and you are in a regular room).    Special instructions:    Oral Hygiene is also important to reduce your risk of infection.  Remember - BRUSH YOUR TEETH THE MORNING OF SURGERY WITH YOUR REGULAR TOOTHPASTE   [facility]- Preparing For Surgery  Before surgery, you can play an important role. Because skin is not sterile, your skin needs to be as free of germs as possible. You can reduce the number of germs on your skin by washing with CHG (chlorahexidine gluconate) Soap before surgery.  CHG is an antiseptic cleaner which kills germs and  bonds with the skin to continue killing germs even after washing.     Please do not use if you have an allergy to CHG or antibacterial soaps. If your skin becomes reddened/irritated stop using the CHG.  Do not shave (including legs and underarms) for at least 48 hours prior to first CHG shower. It is OK to shave your face.  Please follow these instructions carefully.     Shower the NIGHT BEFORE SURGERY and the MORNING OF SURGERY with CHG  Soap.   If you chose to wash your hair, wash your hair first as usual with your normal shampoo. After you shampoo, rinse your hair and body thoroughly to remove the shampoo.  Then [employer] and genitals (private parts) with your normal soap and rinse thoroughly to remove soap.  After that Use CHG Soap as you would any other liquid soap. You can apply CHG directly to the skin and wash gently with a scrungie or a clean washcloth.   Apply the CHG Soap to your body ONLY FROM THE NECK DOWN.  Do not use on open wounds or open sores. Avoid contact with your eyes, ears, mouth and genitals (private parts). Wash Face and genitals (private parts)  with your normal soap.   Wash thoroughly, paying special attention to the area where your surgery will be performed.  Thoroughly rinse your body with warm water from the neck down.  DO NOT shower/wash with your normal soap after using and rinsing off the CHG Soap.  Pat yourself dry with a CLEAN TOWEL.  Wear CLEAN PAJAMAS to bed the night before surgery  Place CLEAN SHEETS on your bed the night before your surgery  DO NOT SLEEP WITH PETS.   Day of Surgery: Take a shower with CHG soap. Wear Clean/Comfortable clothing the morning of surgery Do not apply any deodorants/lotions.   Remember to brush your teeth WITH YOUR REGULAR TOOTHPASTE.    If you received a COVID test during your pre-op visit, it is requested that you wear a mask when out in public, stay away from anyone that may not be feeling well, and notify your surgeon if you develop symptoms. If you have been in contact with anyone that has tested positive in the last 10 days, please notify your surgeon.    Please read over the following fact sheets that you were given.

## 2022-06-29 ENCOUNTER — Encounter (HOSPITAL_COMMUNITY)
Admission: RE | Admit: 2022-06-29 | Discharge: 2022-06-29 | Disposition: A | Discharge status: Home / Self Care | Payer: Commercial Managed Care - HMO | Source: Ambulatory Visit | Attending: Thoracic Surgery (Cardiothoracic Vascular Surgery) | Admitting: Thoracic Surgery (Cardiothoracic Vascular Surgery)

## 2022-06-29 ENCOUNTER — Encounter (HOSPITAL_COMMUNITY): Payer: Self-pay

## 2022-06-29 ENCOUNTER — Ambulatory Visit (HOSPITAL_COMMUNITY)
Admission: RE | Admit: 2022-06-29 | Discharge: 2022-06-29 | Disposition: A | Discharge status: Home / Self Care | Payer: Commercial Managed Care - HMO | Source: Ambulatory Visit | Attending: Thoracic Surgery (Cardiothoracic Vascular Surgery) | Admitting: Thoracic Surgery (Cardiothoracic Vascular Surgery)

## 2022-06-29 ENCOUNTER — Other Ambulatory Visit: Payer: Self-pay

## 2022-06-29 ENCOUNTER — Telehealth: Payer: Self-pay | Admitting: Pulmonary Disease

## 2022-06-29 VITALS — BP 155/79 | HR 78 | Temp 98.1°F | Resp 17 | Ht 60.0 in | Wt 140.0 lb

## 2022-06-29 DIAGNOSIS — Z1152 Encounter for screening for COVID-19: Secondary | ICD-10-CM | POA: Insufficient documentation

## 2022-06-29 DIAGNOSIS — Z87891 Personal history of nicotine dependence: Secondary | ICD-10-CM | POA: Insufficient documentation

## 2022-06-29 DIAGNOSIS — J439 Emphysema, unspecified: Secondary | ICD-10-CM | POA: Insufficient documentation

## 2022-06-29 DIAGNOSIS — Z01818 Encounter for other preprocedural examination: Secondary | ICD-10-CM

## 2022-06-29 DIAGNOSIS — N182 Chronic kidney disease, stage 2 (mild): Secondary | ICD-10-CM | POA: Insufficient documentation

## 2022-06-29 DIAGNOSIS — Z8572 Personal history of non-Hodgkin lymphomas: Secondary | ICD-10-CM | POA: Insufficient documentation

## 2022-06-29 DIAGNOSIS — Z7951 Long term (current) use of inhaled steroids: Secondary | ICD-10-CM | POA: Insufficient documentation

## 2022-06-29 DIAGNOSIS — I700 Atherosclerosis of aorta: Secondary | ICD-10-CM | POA: Insufficient documentation

## 2022-06-29 DIAGNOSIS — I129 Hypertensive chronic kidney disease with stage 1 through stage 4 chronic kidney disease, or unspecified chronic kidney disease: Secondary | ICD-10-CM | POA: Insufficient documentation

## 2022-06-29 DIAGNOSIS — C3491 Malignant neoplasm of unspecified part of right bronchus or lung: Secondary | ICD-10-CM | POA: Insufficient documentation

## 2022-06-29 DIAGNOSIS — K219 Gastro-esophageal reflux disease without esophagitis: Secondary | ICD-10-CM | POA: Insufficient documentation

## 2022-06-29 DIAGNOSIS — Z90722 Acquired absence of ovaries, bilateral: Secondary | ICD-10-CM | POA: Insufficient documentation

## 2022-06-29 DIAGNOSIS — I8390 Asymptomatic varicose veins of unspecified lower extremity: Secondary | ICD-10-CM | POA: Insufficient documentation

## 2022-06-29 DIAGNOSIS — E785 Hyperlipidemia, unspecified: Secondary | ICD-10-CM | POA: Insufficient documentation

## 2022-06-29 DIAGNOSIS — R0600 Dyspnea, unspecified: Secondary | ICD-10-CM | POA: Insufficient documentation

## 2022-06-29 DIAGNOSIS — R911 Solitary pulmonary nodule: Secondary | ICD-10-CM | POA: Insufficient documentation

## 2022-06-29 DIAGNOSIS — M797 Fibromyalgia: Secondary | ICD-10-CM | POA: Insufficient documentation

## 2022-06-29 DIAGNOSIS — Z90710 Acquired absence of both cervix and uterus: Secondary | ICD-10-CM | POA: Insufficient documentation

## 2022-06-29 HISTORY — DX: Unspecified asthma, uncomplicated: J45.909

## 2022-06-29 HISTORY — DX: Dyspnea, unspecified: R06.00

## 2022-06-29 LAB — PULMONARY FUNCTION TEST
DL/VA % pred: 89 %
DL/VA: 3.86 ml/min/mmHg/L
DLCO unc % pred: 86 %
DLCO unc: 15.14 ml/min/mmHg
FEF 25-75 Post: 1.36 L/sec
FEF 25-75 Pre: 1 L/sec
FEF2575-%Change-Post: 36 %
FEF2575-%Pred-Post: 66 %
FEF2575-%Pred-Pre: 49 %
FEV1-%Change-Post: 8 %
FEV1-%Pred-Post: 83 %
FEV1-%Pred-Pre: 77 %
FEV1-Post: 1.79 L
FEV1-Pre: 1.64 L
FEV1FVC-%Change-Post: 1 %
FEV1FVC-%Pred-Pre: 89 %
FEV6-%Change-Post: 8 %
FEV6-%Pred-Post: 93 %
FEV6-%Pred-Pre: 86 %
FEV6-Post: 2.5 L
FEV6-Pre: 2.32 L
FEV6FVC-%Change-Post: 1 %
FEV6FVC-%Pred-Post: 102 %
FEV6FVC-%Pred-Pre: 101 %
FVC-%Change-Post: 6 %
FVC-%Pred-Post: 91 %
FVC-%Pred-Pre: 85 %
FVC-Post: 2.54 L
FVC-Pre: 2.37 L
Post FEV1/FVC ratio: 71 %
Post FEV6/FVC ratio: 99 %
Pre FEV1/FVC ratio: 69 %
Pre FEV6/FVC Ratio: 98 %
RV % pred: 114 %
RV: 2.1 L
TLC % pred: 101 %
TLC: 4.53 L

## 2022-06-29 LAB — COMPREHENSIVE METABOLIC PANEL
ALT: 10 U/L (ref 0–44)
AST: 13 U/L — ABNORMAL LOW (ref 15–41)
Albumin: 3.8 g/dL (ref 3.5–5.0)
Alkaline Phosphatase: 66 U/L (ref 38–126)
Anion gap: 9 (ref 5–15)
BUN: 17 mg/dL (ref 8–23)
CO2: 21 mmol/L — ABNORMAL LOW (ref 22–32)
Calcium: 8.7 mg/dL — ABNORMAL LOW (ref 8.9–10.3)
Chloride: 106 mmol/L (ref 98–111)
Creatinine, Ser: 0.56 mg/dL (ref 0.44–1.00)
GFR, Estimated: >60 mL/min (ref >60)
Glucose, Bld: 107 mg/dL — ABNORMAL HIGH (ref 70–99)
Potassium: 3.4 mmol/L — ABNORMAL LOW (ref 3.5–5.1)
Sodium: 136 mmol/L (ref 135–145)
Total Bilirubin: 0.2 mg/dL — ABNORMAL LOW (ref 0.3–1.2)
Total Protein: 6.2 g/dL — ABNORMAL LOW (ref 6.5–8.1)

## 2022-06-29 LAB — CBC
HCT: 39 % (ref 36.0–46.0)
Hemoglobin: 13 g/dL (ref 12.0–15.0)
MCH: 30.9 pg (ref 26.0–34.0)
MCHC: 33.3 g/dL (ref 30.0–36.0)
MCV: 92.6 fL (ref 80.0–100.0)
Platelets: 214 10*3/uL (ref 150–400)
RBC: 4.21 MIL/uL (ref 3.87–5.11)
RDW: 13 % (ref 11.5–15.5)
WBC: 6.4 10*3/uL (ref 4.0–10.5)
nRBC: 0 % (ref 0.0–0.2)

## 2022-06-29 LAB — BLOOD GAS, ARTERIAL
Acid-base deficit: 0.2 mmol/L (ref 0.0–2.0)
Bicarbonate: 24 mmol/L (ref 20.0–28.0)
Drawn by: 58793
O2 Saturation: 99.6 %
Patient temperature: 37
pCO2 arterial: 37 mmHg (ref 32–48)
pH, Arterial: 7.42 (ref 7.35–7.45)
pO2, Arterial: 89 mmHg (ref 83–108)

## 2022-06-29 LAB — SURGICAL PCR SCREEN
MRSA, PCR: NEGATIVE
Staphylococcus aureus: NEGATIVE

## 2022-06-29 LAB — PROTIME-INR
INR: 1 (ref 0.8–1.2)
Prothrombin Time: 13.2 seconds (ref 11.4–15.2)

## 2022-06-29 LAB — APTT: aPTT: 31 seconds (ref 24–36)

## 2022-06-29 MED ORDER — ALBUTEROL SULFATE (2.5 MG/3ML) 0.083% IN NEBU
2.5000 mg | INHALATION_SOLUTION | Freq: Once | RESPIRATORY_TRACT | Status: AC
  Administered 2022-06-29: 2.5 mg via RESPIRATORY_TRACT

## 2022-06-29 NOTE — Progress Notes (Signed)
PCP - Dr. Lupe Carney Cardiologist - denies  PPM/ICD - n/a Device Orders - n/a Rep Notified - n/a  Chest x-ray - 06/29/22 (Within 72 hours of surgery) EKG - 06/29/22 (Within one month of surgery) Stress Test - 02/20/17 ECHO - 02/19/17 Cardiac Cath - denies  Sleep Study - n/a CPAP - n/a  Fasting Blood Sugar - n/a Checks Blood Sugar _____ times a day- n/a  Last dose of GLP1 agonist-  n/a GLP1 instructions: n/a  Blood Thinner Instructions: n/a Aspirin Instructions: n/a  ERAS Protcol - NPO PRE-SURGERY Ensure or G2- n/a  COVID TEST- 06/29/22. Pending.    Anesthesia review: Yes.   Patient denies shortness of breath, fever, cough and chest pain at PAT appointment   All instructions explained to the patient, with a verbal understanding of the material. Patient agrees to go over the instructions while at home for a better understanding. The opportunity to ask questions was provided.

## 2022-06-29 NOTE — Progress Notes (Signed)
Patient unable to provide a urine specimen at PAT appointment. Darius Bump, RN with Dr. Sunday Corn office made aware. Will collect urinalysis day of surgery.

## 2022-06-30 ENCOUNTER — Encounter: Payer: Self-pay | Admitting: Pulmonary Disease

## 2022-06-30 ENCOUNTER — Ambulatory Visit
Admission: RE | Admit: 2022-06-30 | Discharge: 2022-06-30 | Disposition: A | Discharge status: Home / Self Care | Payer: Commercial Managed Care - HMO | Source: Ambulatory Visit | Attending: Student | Admitting: Student

## 2022-06-30 ENCOUNTER — Other Ambulatory Visit: Payer: Commercial Managed Care - HMO

## 2022-06-30 ENCOUNTER — Ambulatory Visit: Payer: Commercial Managed Care - HMO | Admitting: Pulmonary Disease

## 2022-06-30 VITALS — BP 122/64 | HR 68 | Wt 133.0 lb

## 2022-06-30 DIAGNOSIS — R911 Solitary pulmonary nodule: Secondary | ICD-10-CM | POA: Diagnosis not present

## 2022-06-30 LAB — SARS CORONAVIRUS 2 (TAT 6-24 HRS): SARS Coronavirus 2: NEGATIVE

## 2022-06-30 NOTE — Telephone Encounter (Signed)
Spoke with patient and made her aware of the denial and also notified her that a peer to peer is scheduled with Dr. Thora Lance for 07/01/22 @ 8am with Dr. Joan Mayans.

## 2022-06-30 NOTE — Progress Notes (Addendum)
Anesthesia Chart Review:   Case: [id number] Date/Time: 07/02/22 0730   Procedure: ROBOTIC ASSISTED NAVIGATIONAL BRONCHOSCOPY   Anesthesia type: General   Pre-op diagnosis: lung nodule   [facility] / [facility]   Surgeons: Omar Person, MD       DISCUSSION: Patient is a 62 year old female scheduled for the above procedure. 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 She has since had follow-up with pulmonologist Dr. Judeth Horn, and CT surgeon Dr. Dorris Fetch. Per Dr. Sunday Corn 06/17/22 note, "Will arrange for robotic bronchoscopy for marking prior to robotic right lower lobe segmentectomy versus lobectomy."    History includes former smoker (quit 06/14/20), HTN, HLD, COPD/emphysema, asthma, dyspnea, aortic atherosclerosis, CKD (stage II), lymphoma (Follicular lymphoma Grade IIIa, lymph nodes of head/neck, diagnosed 03/2016, s/p chemotherapy R-CHOP x3), GERD, fibromyalgia, varicose veins (right GSV laser ablation 07/02/13). Reported being "slow to wake up" after anesthesia.  Dr. Dorris Fetch classified her Zubrod Score as 1: Restricted in physical strenuous activity but ambulatory, able to do out light work   Staff were unable to collect urinalysis at PAT so will be done on the day of surgery.  06/29/22 COVID-19 test negative. Anesthesia team to evaluation on the day of surgery. 06/29/22 CXR and 06/30/22 Super D Chest CT reports are still pending. (UPDATE 07/01/22 10:04 AM: CXR and Super D CT reports noted.)   VS: BP (!) 155/79   Pulse 78   Temp 36.7 C   Resp 17   Ht 5' (1.524 m)   Wt 63.5 kg   SpO2 98%   BMI 27.34 kg/m    PROVIDERS: Mitchell, L.August Saucer, MD is PCP  Artis Delay, MD is HEM-ONC Vilma Meckel, MD is pulmonologist   LABS: Labs reviewed: Acceptable for surgery. (all labs ordered are listed, but only abnormal results are displayed)  Labs Reviewed  COMPREHENSIVE  METABOLIC PANEL - Abnormal; Notable for the following components:      Result Value   Potassium 3.4 (*)    CO2 21 (*)    Glucose, Bld 107 (*)    Calcium 8.7 (*)    Total Protein 6.2 (*)    AST 13 (*)    Total Bilirubin 0.2 (*)    All other components within normal limits  SARS CORONAVIRUS 2 (TAT 6-24 HRS)  SURGICAL PCR SCREEN  CBC  BLOOD GAS, ARTERIAL  PROTIME-INR  APTT  TYPE AND SCREEN    Pulmonary function testing 06/29/22: FVC 2.37 (85%), post 2.54 (91%) FEV1 1.64 (77%), post 1.79 (83%) DLCO unc 15.14 (86%)   IMAGES: CT Super D Chest 06/30/22:  IMPRESSION: - Essentially stable nodule in the right lower lobe with adjacent clip. - Subtle reticulonodular changes in the inferolateral aspect of the right upper lobe and middle lobe are stable. - Aortic Atherosclerosis (ICD10-I70.0) and Emphysema (ICD10-J43.9).  CXR 06/29/22:  FINDINGS: Mild hyperinflation suggesting emphysema. Heart size and pulmonary vascularity are normal. Surgical marker in the right medial posterior lung, likely superior segment of the right lower lung. Lungs are clear. No pleural effusions. No pneumothorax. Mediastinal contours appear intact. Degenerative changes in the spine and shoulders. Surgical clips in the right upper quadrant. IMPRESSION: No active cardiopulmonary disease.  PET Scan 05/27/22: IMPRESSION: 1. Small ground-glass nodule in the right lower lobe does not show any hypermetabolism. Nearby fiducials noted. 2. No findings for mediastinal/hilar adenopathy or metastatic disease involving the abdomen/pelvis or bony structures.    EKG: 06/29/22: Normal  sinus rhythm Low voltage QRS   CV: Echo 02/19/17: Study Conclusions  - Left ventricle: The cavity size was normal. Systolic function was    normal. The estimated ejection fraction was in the range of 55%    to 60%. Wall motion was normal; there were no regional wall    motion abnormalities.   Nuclear stress test  02/20/17: IMPRESSION: 1. No evidence of reversible ischemia. Small infarct or scar at the true ventricular apex. 2. Normal left ventricular wall motion. 3. Left ventricular ejection fraction 62% 4. Non invasive risk stratification: Low    Past Medical History:  Diagnosis Date   ADHD (attention deficit hyperactivity disorder)    Allergies    Anxiety    Aortic atherosclerosis (HCC)    Arthritis    WRIST AND BACK   Asthma    Atrophic vaginitis    CKD (chronic kidney disease), stage II    Complication of anesthesia    slow to wake up   COPD (chronic obstructive pulmonary disease) (HCC)    pt denies this dx   COVID 2022   states it was a bad case, not hospitalized   COVID-19    Depression    Diverticulosis    Dyspnea    Emphysema/COPD (HCC)    Family history of adverse reaction to anesthesia    mother was hard to wake up, acted weird afterwards   Fibromyalgia    Gastric ulcer    GERD (gastroesophageal reflux disease)    History of Barrett's esophagus    History of head and neck cancer    History of kidney stones    Hx of colonic polyps    Hyperlipidemia    Hypertension    Insomnia    Lymphoma (HCC)    Lymphoma   Melanosis coli    Osteopenia    Osteoporosis    Plantar fasciitis    Tobacco abuse    Tubular adenoma of colon    Vallecular mass    Varicose veins    Vitamin D deficiency     Past Surgical History:  Procedure Laterality Date   ABDOMINAL HYSTERECTOMY     APPENDECTOMY     BILATERAL SALPINGOOPHORECTOMY     BIOPSY  02/25/2022   Procedure: BIOPSY;  Surgeon: Sherrilyn Rist, MD;  Location: [facility];  Service: Gastroenterology;;   BRONCHIAL BIOPSY  05/03/2022   Procedure: BRONCHIAL BIOPSIES;  Surgeon: Josephine Igo, DO;  Location: [facility];  Service: Pulmonary;;   BRONCHIAL BRUSHINGS  05/03/2022   Procedure: BRONCHIAL BRUSHINGS;  Surgeon: Josephine Igo, DO;  Location: [facility];  Service: Pulmonary;;   CHOLECYSTECTOMY     COLONOSCOPY  WITH PROPOFOL N/A 02/25/2022   Procedure: COLONOSCOPY WITH PROPOFOL;  Surgeon: Sherrilyn Rist, MD;  Location: [facility];  Service: Gastroenterology;  Laterality: N/A;   ESOPHAGEAL MANOMETRY N/A 04/28/2016   Procedure: ESOPHAGEAL MANOMETRY (EM);  Surgeon: Napoleon Form, MD;  Location: [facility];  Service: Endoscopy;  Laterality: N/A;  dr. Myrtie Neither   ESOPHAGOGASTRODUODENOSCOPY (EGD) WITH PROPOFOL N/A 02/25/2022   Procedure: ESOPHAGOGASTRODUODENOSCOPY (EGD) WITH PROPOFOL;  Surgeon: Sherrilyn Rist, MD;  Location: [facility];  Service: Gastroenterology;  Laterality: N/A;   KNEE ARTHROSCOPY     LYMPH NODE BIOPSY     TONSILLECTOMY     UPPER GASTROINTESTINAL ENDOSCOPY     WRIST SURGERY Right     MEDICATIONS:  amLODipine (NORVASC) 5 MG tablet   buPROPion (WELLBUTRIN SR) 150 MG 12 hr tablet  Cholecalciferol (DIALYVITE VITAMIN D 5000) 125 MCG (5000 UT) capsule   Cyanocobalamin (VITAMIN B-12 PO)   diclofenac (VOLTAREN) 50 MG EC tablet   esomeprazole (NEXIUM) 40 MG capsule   fluticasone (FLONASE) 50 MCG/ACT nasal spray   LORazepam (ATIVAN) 0.5 MG tablet   Multiple Vitamin (MULTIVITAMIN WITH MINERALS) TABS tablet   potassium chloride (KLOR-CON M) 10 MEQ tablet   rosuvastatin (CRESTOR) 10 MG tablet   salmeterol (SEREVENT DISKUS) 50 MCG/ACT diskus inhaler   telmisartan (MICARDIS) 40 MG tablet   traZODone (DESYREL) 50 MG tablet   zolpidem (AMBIEN) 10 MG tablet   No current facility-administered medications for this encounter.    Shonna Chock, PA-C Surgical Short Stay/Anesthesiology [facility] Phone [phone] [facility] Phone [phone] 06/30/2022 2:18 PM

## 2022-06-30 NOTE — Anesthesia Preprocedure Evaluation (Addendum)
Anesthesia Evaluation  Patient identified by MRN, date of birth, ID band Patient awake    Reviewed: Allergy & Precautions, NPO status , Patient's Chart, lab work & pertinent test results  History of Anesthesia Complications Negative for: history of anesthetic complications  Airway Mallampati: III  TM Distance: >3 FB Neck ROM: Full    Dental  (+) Teeth Intact, Dental Advisory Given   Pulmonary shortness of breath, asthma , COPD, former smoker Right lung mass   breath sounds clear to auscultation       Cardiovascular hypertension, Pt. on medications (-) angina (-) Past MI and (-) CHF  Rhythm:Regular  Left ventricle: The cavity size was normal. Systolic function was    normal. The estimated ejection fraction was in the range of 55%    to 60%. Wall motion was normal; there were no regional wall    motion abnormalities.     Neuro/Psych  PSYCHIATRIC DISORDERS Anxiety Depression     Neuromuscular disease    GI/Hepatic Neg liver ROS, PUD,GERD  Medicated,,  Endo/Other  negative endocrine ROS    Renal/GU negative Renal ROSLab Results      Component                Value               Date                      CREATININE               0.56                06/29/2022                Musculoskeletal  (+) Arthritis ,  Fibromyalgia -  Abdominal   Peds  Hematology negative hematology ROS (+) Lab Results      Component                Value               Date                      WBC                      6.4                 06/29/2022                HGB                      13.0                06/29/2022                HCT                      39.0                06/29/2022                MCV                      92.6                06/29/2022                PLT  214                 06/29/2022              Anesthesia Other Findings   Reproductive/Obstetrics                               Anesthesia Physical Anesthesia Plan  ASA: 2  Anesthesia Plan: General   Post-op Pain Management: Toradol IV (intra-op)* and Ofirmev IV (intra-op)*   Induction: Intravenous  PONV Risk Score and Plan: 3 and Ondansetron, Dexamethasone and Propofol infusion  Airway Management Planned: Oral ETT and Double Lumen EBT  Additional Equipment: Arterial line  Intra-op Plan:   Post-operative Plan: Extubation in OR  Informed Consent: I have reviewed the patients History and Physical, chart, labs and discussed the procedure including the risks, benefits and alternatives for the proposed anesthesia with the patient or authorized representative who has indicated his/her understanding and acceptance.     Dental advisory given  Plan Discussed with: CRNA  Anesthesia Plan Comments: (PAT note written 06/30/2022 by Shonna Chock, PA-C.  )        Anesthesia Quick Evaluation

## 2022-06-30 NOTE — Patient Instructions (Signed)
Nice to see you again   I will discuss Dr. Thora Lance and give him the disc  No changes to medications   Return to clinic in 6 weeks with Dr. Judeth Horn

## 2022-07-02 ENCOUNTER — Inpatient Hospital Stay (HOSPITAL_COMMUNITY)
Admission: AD | Admit: 2022-07-02 | Discharge: 2022-07-06 | DRG: 164 | Disposition: A | Discharge status: Home / Self Care | Payer: Commercial Managed Care - HMO | Attending: Thoracic Surgery (Cardiothoracic Vascular Surgery) | Admitting: Thoracic Surgery (Cardiothoracic Vascular Surgery)

## 2022-07-02 ENCOUNTER — Inpatient Hospital Stay (HOSPITAL_COMMUNITY): Payer: Commercial Managed Care - HMO

## 2022-07-02 ENCOUNTER — Encounter (HOSPITAL_COMMUNITY)
Admission: AD | Disposition: A | Discharge status: Home / Self Care | Payer: Self-pay | Source: Ambulatory Visit | Attending: Thoracic Surgery (Cardiothoracic Vascular Surgery)

## 2022-07-02 ENCOUNTER — Encounter (HOSPITAL_COMMUNITY): Payer: Self-pay | Admitting: Student

## 2022-07-02 ENCOUNTER — Ambulatory Visit (HOSPITAL_COMMUNITY): Payer: Commercial Managed Care - HMO | Admitting: Vascular Surgery

## 2022-07-02 ENCOUNTER — Inpatient Hospital Stay: Payer: Commercial Managed Care - HMO

## 2022-07-02 ENCOUNTER — Ambulatory Visit (HOSPITAL_COMMUNITY): Payer: Commercial Managed Care - HMO | Admitting: Certified Registered Nurse Anesthetist

## 2022-07-02 DIAGNOSIS — Z8042 Family history of malignant neoplasm of prostate: Secondary | ICD-10-CM | POA: Diagnosis not present

## 2022-07-02 DIAGNOSIS — D62 Acute posthemorrhagic anemia: Secondary | ICD-10-CM | POA: Diagnosis not present

## 2022-07-02 DIAGNOSIS — Z800 Family history of malignant neoplasm of digestive organs: Secondary | ICD-10-CM

## 2022-07-02 DIAGNOSIS — Z885 Allergy status to narcotic agent status: Secondary | ICD-10-CM | POA: Diagnosis not present

## 2022-07-02 DIAGNOSIS — Z91040 Latex allergy status: Secondary | ICD-10-CM

## 2022-07-02 DIAGNOSIS — C3431 Malignant neoplasm of lower lobe, right bronchus or lung: Principal | ICD-10-CM | POA: Diagnosis present

## 2022-07-02 DIAGNOSIS — J9811 Atelectasis: Secondary | ICD-10-CM | POA: Diagnosis not present

## 2022-07-02 DIAGNOSIS — Z83438 Family history of other disorder of lipoprotein metabolism and other lipidemia: Secondary | ICD-10-CM

## 2022-07-02 DIAGNOSIS — Z803 Family history of malignant neoplasm of breast: Secondary | ICD-10-CM | POA: Diagnosis not present

## 2022-07-02 DIAGNOSIS — Z8249 Family history of ischemic heart disease and other diseases of the circulatory system: Secondary | ICD-10-CM

## 2022-07-02 DIAGNOSIS — M797 Fibromyalgia: Secondary | ICD-10-CM | POA: Diagnosis present

## 2022-07-02 DIAGNOSIS — Z83719 Family history of colon polyps, unspecified: Secondary | ICD-10-CM | POA: Diagnosis not present

## 2022-07-02 DIAGNOSIS — Z880 Allergy status to penicillin: Secondary | ICD-10-CM

## 2022-07-02 DIAGNOSIS — Z87891 Personal history of nicotine dependence: Secondary | ICD-10-CM

## 2022-07-02 DIAGNOSIS — R000 Tachycardia, unspecified: Secondary | ICD-10-CM | POA: Diagnosis not present

## 2022-07-02 DIAGNOSIS — Z86010 Personal history of colonic polyps: Secondary | ICD-10-CM

## 2022-07-02 DIAGNOSIS — Z87448 Personal history of other diseases of urinary system: Secondary | ICD-10-CM

## 2022-07-02 DIAGNOSIS — I700 Atherosclerosis of aorta: Secondary | ICD-10-CM | POA: Diagnosis present

## 2022-07-02 DIAGNOSIS — J439 Emphysema, unspecified: Secondary | ICD-10-CM | POA: Diagnosis present

## 2022-07-02 DIAGNOSIS — Z9049 Acquired absence of other specified parts of digestive tract: Secondary | ICD-10-CM | POA: Diagnosis not present

## 2022-07-02 DIAGNOSIS — Z1152 Encounter for screening for COVID-19: Secondary | ICD-10-CM

## 2022-07-02 DIAGNOSIS — Z8572 Personal history of non-Hodgkin lymphomas: Secondary | ICD-10-CM

## 2022-07-02 DIAGNOSIS — R911 Solitary pulmonary nodule: Secondary | ICD-10-CM | POA: Diagnosis not present

## 2022-07-02 DIAGNOSIS — Z833 Family history of diabetes mellitus: Secondary | ICD-10-CM

## 2022-07-02 DIAGNOSIS — Z5111 Encounter for antineoplastic chemotherapy: Secondary | ICD-10-CM

## 2022-07-02 DIAGNOSIS — I959 Hypotension, unspecified: Secondary | ICD-10-CM | POA: Diagnosis not present

## 2022-07-02 DIAGNOSIS — Z79899 Other long term (current) drug therapy: Secondary | ICD-10-CM

## 2022-07-02 DIAGNOSIS — Z808 Family history of malignant neoplasm of other organs or systems: Secondary | ICD-10-CM | POA: Diagnosis not present

## 2022-07-02 DIAGNOSIS — R339 Retention of urine, unspecified: Secondary | ICD-10-CM | POA: Diagnosis not present

## 2022-07-02 DIAGNOSIS — Z8616 Personal history of COVID-19: Secondary | ICD-10-CM

## 2022-07-02 DIAGNOSIS — C3491 Malignant neoplasm of unspecified part of right bronchus or lung: Secondary | ICD-10-CM

## 2022-07-02 DIAGNOSIS — Z888 Allergy status to other drugs, medicaments and biological substances status: Secondary | ICD-10-CM

## 2022-07-02 DIAGNOSIS — I10 Essential (primary) hypertension: Secondary | ICD-10-CM

## 2022-07-02 DIAGNOSIS — J449 Chronic obstructive pulmonary disease, unspecified: Secondary | ICD-10-CM | POA: Diagnosis not present

## 2022-07-02 HISTORY — PX: XI ROBOTIC ASSISTED THORACOSCOPY- SEGMENTECTOMY: SHX6881

## 2022-07-02 HISTORY — PX: FIDUCIAL MARKER PLACEMENT: SHX6858

## 2022-07-02 LAB — URINALYSIS, ROUTINE W REFLEX MICROSCOPIC
Bacteria, UA: NONE SEEN
Bilirubin Urine: NEGATIVE
Glucose, UA: NEGATIVE mg/dL
Ketones, ur: NEGATIVE mg/dL
Nitrite: NEGATIVE
Protein, ur: NEGATIVE mg/dL
Specific Gravity, Urine: 1.01 (ref 1.005–1.030)
pH: 6 (ref 5.0–8.0)

## 2022-07-02 LAB — PREPARE RBC (CROSSMATCH)

## 2022-07-02 LAB — ABO/RH: ABO/RH(D): A POS

## 2022-07-02 SURGERY — RESECTION, LUNG, SEGMENTAL, ROBOT-ASSISTED
Anesthesia: General | Site: Chest | Laterality: Right

## 2022-07-02 SURGERY — BRONCHOSCOPY, WITH BIOPSY USING ELECTROMAGNETIC NAVIGATION
Anesthesia: General

## 2022-07-02 MED ORDER — BSS IO SOLN
15.0000 mL | Freq: Once | INTRAOCULAR | Status: DC
  Filled 2022-07-02: qty 15

## 2022-07-02 MED ORDER — BSS IO SOLN
15.0000 mL | Freq: Once | INTRAOCULAR | Status: AC
  Administered 2022-07-02: 15 mL
  Filled 2022-07-02: qty 15

## 2022-07-02 MED ORDER — KETOROLAC TROMETHAMINE 0.5 % OP SOLN
OPHTHALMIC | Status: AC
  Filled 2022-07-02: qty 5

## 2022-07-02 MED ORDER — OXYCODONE HCL 5 MG PO TABS: 5.0000 mg | ORAL_TABLET | Freq: Once | ORAL | Status: DC | PRN

## 2022-07-02 MED ORDER — SUGAMMADEX SODIUM 200 MG/2ML IV SOLN
INTRAVENOUS | Status: DC | PRN
  Administered 2022-07-02: 118 mg via INTRAVENOUS

## 2022-07-02 MED ORDER — BISACODYL 5 MG PO TBEC
10.0000 mg | DELAYED_RELEASE_TABLET | Freq: Every day | ORAL | Status: DC
  Administered 2022-07-02 – 2022-07-06 (×4): 10 mg via ORAL
  Filled 2022-07-02 (×5): qty 2

## 2022-07-02 MED ORDER — METHYLENE BLUE 1 % INJ SOLN
INTRAVENOUS | Status: DC | PRN
  Administered 2022-07-02: 1.5 mL

## 2022-07-02 MED ORDER — POTASSIUM CHLORIDE IN NACL 20-0.9 MEQ/L-% IV SOLN
INTRAVENOUS | Status: DC
  Filled 2022-07-02 (×2): qty 1000

## 2022-07-02 MED ORDER — CHLORHEXIDINE GLUCONATE 0.12 % MT SOLN
15.0000 mL | Freq: Once | OROMUCOSAL | Status: AC
  Administered 2022-07-02: 15 mL via OROMUCOSAL
  Filled 2022-07-02: qty 15

## 2022-07-02 MED ORDER — POLYMYXIN B-TRIMETHOPRIM 10000-0.1 UNIT/ML-% OP SOLN
1.0000 [drp] | Freq: Four times a day (QID) | OPHTHALMIC | Status: DC
  Filled 2022-07-02: qty 10

## 2022-07-02 MED ORDER — ZOLPIDEM TARTRATE 5 MG PO TABS: 5.0000 mg | ORAL_TABLET | Freq: Every evening | ORAL | Status: DC | PRN

## 2022-07-02 MED ORDER — VANCOMYCIN HCL IN DEXTROSE 1-5 GM/200ML-% IV SOLN
1000.0000 mg | INTRAVENOUS | Status: AC
  Administered 2022-07-02: 1000 mg via INTRAVENOUS
  Filled 2022-07-02: qty 200

## 2022-07-02 MED ORDER — PROPOFOL 500 MG/50ML IV EMUL
INTRAVENOUS | Status: DC | PRN
  Administered 2022-07-02: 125 ug/kg/min via INTRAVENOUS

## 2022-07-02 MED ORDER — ORAL CARE MOUTH RINSE: 15.0000 mL | Freq: Once | OROMUCOSAL | Status: AC

## 2022-07-02 MED ORDER — GABAPENTIN 300 MG PO CAPS
300.0000 mg | ORAL_CAPSULE | Freq: Every day | ORAL | Status: AC
  Administered 2022-07-02 – 2022-07-04 (×3): 300 mg via ORAL
  Filled 2022-07-02 (×3): qty 1

## 2022-07-02 MED ORDER — PROPOFOL 10 MG/ML IV BOLUS
INTRAVENOUS | Status: DC | PRN
  Administered 2022-07-02: 140 mg via INTRAVENOUS

## 2022-07-02 MED ORDER — KETOROLAC TROMETHAMINE 15 MG/ML IJ SOLN
15.0000 mg | Freq: Four times a day (QID) | INTRAMUSCULAR | Status: AC
  Administered 2022-07-02 – 2022-07-04 (×5): 15 mg via INTRAVENOUS
  Filled 2022-07-02 (×5): qty 1

## 2022-07-02 MED ORDER — ONDANSETRON HCL 4 MG/2ML IJ SOLN: 4.0000 mg | Freq: Four times a day (QID) | INTRAMUSCULAR | Status: DC | PRN

## 2022-07-02 MED ORDER — POLYMYXIN B-TRIMETHOPRIM 10000-0.1 UNIT/ML-% OP SOLN
1.0000 [drp] | Freq: Four times a day (QID) | OPHTHALMIC | Status: AC
  Administered 2022-07-02 – 2022-07-03 (×4): 1 [drp] via OPHTHALMIC
  Filled 2022-07-02: qty 10

## 2022-07-02 MED ORDER — BSS IO SOLN
INTRAOCULAR | Status: AC
  Filled 2022-07-02: qty 15

## 2022-07-02 MED ORDER — PANTOPRAZOLE SODIUM 40 MG PO TBEC
40.0000 mg | DELAYED_RELEASE_TABLET | Freq: Every day | ORAL | Status: DC
  Administered 2022-07-03 – 2022-07-06 (×4): 40 mg via ORAL
  Filled 2022-07-02 (×4): qty 1

## 2022-07-02 MED ORDER — SODIUM CHLORIDE 0.9 % IR SOLN
Status: DC | PRN
  Administered 2022-07-02: 1000 mL

## 2022-07-02 MED ORDER — ACETAMINOPHEN 500 MG PO TABS: 1000.0000 mg | ORAL_TABLET | Freq: Once | ORAL | Status: DC | PRN

## 2022-07-02 MED ORDER — ROSUVASTATIN CALCIUM 5 MG PO TABS
10.0000 mg | ORAL_TABLET | ORAL | Status: DC
  Administered 2022-07-05: 10 mg via ORAL
  Filled 2022-07-02 (×4): qty 2

## 2022-07-02 MED ORDER — VANCOMYCIN HCL IN DEXTROSE 1-5 GM/200ML-% IV SOLN
1000.0000 mg | Freq: Two times a day (BID) | INTRAVENOUS | Status: AC
  Administered 2022-07-02: 1000 mg via INTRAVENOUS
  Filled 2022-07-02: qty 200

## 2022-07-02 MED ORDER — PHENYLEPHRINE 80 MCG/ML (10ML) SYRINGE FOR IV PUSH (FOR BLOOD PRESSURE SUPPORT)
PREFILLED_SYRINGE | INTRAVENOUS | Status: DC | PRN
  Administered 2022-07-02: 80 ug via INTRAVENOUS
  Administered 2022-07-02: 40 ug via INTRAVENOUS
  Administered 2022-07-02 (×2): 80 ug via INTRAVENOUS

## 2022-07-02 MED ORDER — FENTANYL CITRATE (PF) 100 MCG/2ML IJ SOLN
INTRAMUSCULAR | Status: AC
  Filled 2022-07-02: qty 2

## 2022-07-02 MED ORDER — KETOROLAC TROMETHAMINE 0.5 % OP SOLN: 1.0000 [drp] | Freq: Four times a day (QID) | OPHTHALMIC | Status: DC

## 2022-07-02 MED ORDER — LACTATED RINGERS IV SOLN: INTRAVENOUS | Status: DC

## 2022-07-02 MED ORDER — ACETAMINOPHEN 160 MG/5ML PO SOLN: 1000.0000 mg | Freq: Once | ORAL | Status: DC | PRN

## 2022-07-02 MED ORDER — TRAMADOL HCL 50 MG PO TABS
50.0000 mg | ORAL_TABLET | Freq: Four times a day (QID) | ORAL | Status: DC | PRN
  Administered 2022-07-03: 100 mg via ORAL
  Filled 2022-07-02: qty 2

## 2022-07-02 MED ORDER — HEMOSTATIC AGENTS (NO CHARGE) OPTIME
TOPICAL | Status: DC | PRN
  Administered 2022-07-02: 2 via TOPICAL

## 2022-07-02 MED ORDER — FENTANYL CITRATE (PF) 100 MCG/2ML IJ SOLN
25.0000 ug | INTRAMUSCULAR | Status: DC | PRN
  Administered 2022-07-02: 50 ug via INTRAVENOUS

## 2022-07-02 MED ORDER — BUPIVACAINE HCL (PF) 0.5 % IJ SOLN
INTRAMUSCULAR | Status: AC
  Filled 2022-07-02: qty 30

## 2022-07-02 MED ORDER — LIDOCAINE 2% (20 MG/ML) 5 ML SYRINGE
INTRAMUSCULAR | Status: DC | PRN
  Administered 2022-07-02: 60 mg via INTRAVENOUS

## 2022-07-02 MED ORDER — KETOROLAC TROMETHAMINE 0.5 % OP SOLN
1.0000 [drp] | Freq: Four times a day (QID) | OPHTHALMIC | Status: AC
  Administered 2022-07-02 – 2022-07-03 (×4): 1 [drp] via OPHTHALMIC
  Filled 2022-07-02: qty 5

## 2022-07-02 MED ORDER — OXYCODONE HCL 5 MG/5ML PO SOLN: 5.0000 mg | Freq: Once | ORAL | Status: DC | PRN

## 2022-07-02 MED ORDER — BUPIVACAINE LIPOSOME 1.3 % IJ SUSP
INTRAMUSCULAR | Status: AC
  Filled 2022-07-02: qty 20

## 2022-07-02 MED ORDER — MIDAZOLAM HCL 2 MG/2ML IJ SOLN
INTRAMUSCULAR | Status: DC | PRN
  Administered 2022-07-02: 2 mg via INTRAVENOUS

## 2022-07-02 MED ORDER — OXYCODONE HCL 5 MG PO TABS
5.0000 mg | ORAL_TABLET | ORAL | Status: DC | PRN
  Administered 2022-07-02: 5 mg via ORAL
  Administered 2022-07-02 – 2022-07-06 (×13): 10 mg via ORAL
  Filled 2022-07-02 (×2): qty 2
  Filled 2022-07-02 (×2): qty 1
  Filled 2022-07-02 (×11): qty 2

## 2022-07-02 MED ORDER — ARFORMOTEROL TARTRATE 15 MCG/2ML IN NEBU
15.0000 ug | INHALATION_SOLUTION | Freq: Two times a day (BID) | RESPIRATORY_TRACT | Status: DC
  Administered 2022-07-02 – 2022-07-06 (×8): 15 ug via RESPIRATORY_TRACT
  Filled 2022-07-02 (×8): qty 2

## 2022-07-02 MED ORDER — AMLODIPINE BESYLATE 5 MG PO TABS
5.0000 mg | ORAL_TABLET | Freq: Every morning | ORAL | Status: DC
  Filled 2022-07-02: qty 1

## 2022-07-02 MED ORDER — ACETAMINOPHEN 10 MG/ML IV SOLN
1000.0000 mg | Freq: Once | INTRAVENOUS | Status: DC | PRN
  Administered 2022-07-02: 1000 mg via INTRAVENOUS

## 2022-07-02 MED ORDER — ROCURONIUM BROMIDE 10 MG/ML (PF) SYRINGE
PREFILLED_SYRINGE | INTRAVENOUS | Status: AC
  Filled 2022-07-02: qty 10

## 2022-07-02 MED ORDER — FENTANYL CITRATE (PF) 250 MCG/5ML IJ SOLN
INTRAMUSCULAR | Status: DC | PRN
  Administered 2022-07-02: 100 ug via INTRAVENOUS
  Administered 2022-07-02: 50 ug via INTRAVENOUS
  Administered 2022-07-02: 25 ug via INTRAVENOUS

## 2022-07-02 MED ORDER — INDOCYANINE GREEN 25 MG IV SOLR
INTRAVENOUS | Status: AC
  Filled 2022-07-02: qty 10

## 2022-07-02 MED ORDER — ROCURONIUM BROMIDE 10 MG/ML (PF) SYRINGE
PREFILLED_SYRINGE | INTRAVENOUS | Status: DC | PRN
  Administered 2022-07-02: 20 mg via INTRAVENOUS
  Administered 2022-07-02: 30 mg via INTRAVENOUS
  Administered 2022-07-02 (×2): 40 mg via INTRAVENOUS
  Administered 2022-07-02: 60 mg via INTRAVENOUS

## 2022-07-02 MED ORDER — LORAZEPAM 0.5 MG PO TABS: 0.2500 mg | ORAL_TABLET | Freq: Two times a day (BID) | ORAL | Status: DC | PRN

## 2022-07-02 MED ORDER — SODIUM CHLORIDE FLUSH 0.9 % IV SOLN
INTRAVENOUS | Status: DC | PRN
  Administered 2022-07-02: 90 mL

## 2022-07-02 MED ORDER — ACETAMINOPHEN 10 MG/ML IV SOLN: 1000.0000 mg | Freq: Once | INTRAVENOUS | Status: DC | PRN

## 2022-07-02 MED ORDER — BUPROPION HCL ER (SR) 150 MG PO TB12
150.0000 mg | ORAL_TABLET | Freq: Every morning | ORAL | Status: DC
  Administered 2022-07-03 – 2022-07-06 (×4): 150 mg via ORAL
  Filled 2022-07-02 (×4): qty 1

## 2022-07-02 MED ORDER — GABAPENTIN 300 MG PO CAPS
300.0000 mg | ORAL_CAPSULE | Freq: Two times a day (BID) | ORAL | Status: DC
  Administered 2022-07-05 – 2022-07-06 (×2): 300 mg via ORAL
  Filled 2022-07-02 (×2): qty 1

## 2022-07-02 MED ORDER — ENOXAPARIN SODIUM 40 MG/0.4ML IJ SOSY
40.0000 mg | PREFILLED_SYRINGE | INTRAMUSCULAR | Status: DC
  Administered 2022-07-03 – 2022-07-06 (×4): 40 mg via SUBCUTANEOUS
  Filled 2022-07-02 (×4): qty 0.4

## 2022-07-02 MED ORDER — FENTANYL CITRATE (PF) 100 MCG/2ML IJ SOLN: 25.0000 ug | INTRAMUSCULAR | Status: DC | PRN

## 2022-07-02 MED ORDER — MIDAZOLAM HCL 2 MG/2ML IJ SOLN
INTRAMUSCULAR | Status: AC
  Filled 2022-07-02: qty 2

## 2022-07-02 MED ORDER — ONDANSETRON HCL 4 MG/2ML IJ SOLN
INTRAMUSCULAR | Status: DC | PRN
  Administered 2022-07-02: 4 mg via INTRAVENOUS

## 2022-07-02 MED ORDER — 0.9 % SODIUM CHLORIDE (POUR BTL) OPTIME
TOPICAL | Status: DC | PRN
  Administered 2022-07-02: 2000 mL

## 2022-07-02 MED ORDER — LACTATED RINGERS IV SOLN: INTRAVENOUS | Status: DC | PRN

## 2022-07-02 MED ORDER — DEXAMETHASONE SODIUM PHOSPHATE 10 MG/ML IJ SOLN
INTRAMUSCULAR | Status: DC | PRN
  Administered 2022-07-02: 10 mg via INTRAVENOUS

## 2022-07-02 MED ORDER — SENNOSIDES-DOCUSATE SODIUM 8.6-50 MG PO TABS
1.0000 | ORAL_TABLET | Freq: Every day | ORAL | Status: DC
  Administered 2022-07-02 – 2022-07-05 (×4): 1 via ORAL
  Filled 2022-07-02 (×4): qty 1

## 2022-07-02 MED ORDER — FENTANYL CITRATE (PF) 250 MCG/5ML IJ SOLN
INTRAMUSCULAR | Status: AC
  Filled 2022-07-02: qty 5

## 2022-07-02 MED ORDER — TRAZODONE HCL 50 MG PO TABS
50.0000 mg | ORAL_TABLET | Freq: Every day | ORAL | Status: DC
  Administered 2022-07-02 – 2022-07-05 (×4): 50 mg via ORAL
  Filled 2022-07-02 (×4): qty 1

## 2022-07-02 MED ORDER — PHENYLEPHRINE HCL-NACL 20-0.9 MG/250ML-% IV SOLN
INTRAVENOUS | Status: DC | PRN
  Administered 2022-07-02: 30 ug/min via INTRAVENOUS

## 2022-07-02 MED ORDER — ONDANSETRON HCL 4 MG/2ML IJ SOLN
INTRAMUSCULAR | Status: AC
  Filled 2022-07-02: qty 2

## 2022-07-02 MED ORDER — ACETAMINOPHEN 10 MG/ML IV SOLN
INTRAVENOUS | Status: AC
  Filled 2022-07-02: qty 100

## 2022-07-02 SURGICAL SUPPLY — 106 items
ADH SKN CLS APL DERMABOND .7 (GAUZE/BANDAGES/DRESSINGS) ×1
APPLIER CLIP ROT 10 11.4 M/L (STAPLE)
APR CLP MED LRG 11.4X10 (STAPLE)
BAG SPEC RTRVL C125 8X14 (MISCELLANEOUS) ×1
BLADE CLIPPER SURG (BLADE) ×2 IMPLANT
CANISTER SUCT 3000ML PPV (MISCELLANEOUS) ×4 IMPLANT
CANNULA REDUC XI 12-8 STAPL (CANNULA) ×2
CANNULA REDUCER 12-8 DVNC XI (CANNULA) ×4 IMPLANT
CLIP APPLIE ROT 10 11.4 M/L (STAPLE) IMPLANT
CLIP VESOCCLUDE MED 6/CT (CLIP) IMPLANT
CNTNR URN SCR LID CUP LEK RST (MISCELLANEOUS) ×10 IMPLANT
CONN ST 1/4X3/8  BEN (MISCELLANEOUS) ×1
CONN ST 1/4X3/8 BEN (MISCELLANEOUS) IMPLANT
CONT SPEC 4OZ STRL OR WHT (MISCELLANEOUS) ×10
DEFOGGER SCOPE WARMER CLEARIFY (MISCELLANEOUS) ×2 IMPLANT
DERMABOND ADVANCED .7 DNX12 (GAUZE/BANDAGES/DRESSINGS) ×2 IMPLANT
DRAIN CHANNEL 28F RND 3/8 FF (WOUND CARE) IMPLANT
DRAIN CHANNEL 32F RND 10.7 FF (WOUND CARE) IMPLANT
DRAPE ARM DVNC X/XI (DISPOSABLE) ×8 IMPLANT
DRAPE COLUMN DVNC XI (DISPOSABLE) ×2 IMPLANT
DRAPE CV SPLIT W-CLR ANES SCRN (DRAPES) ×2 IMPLANT
DRAPE DA VINCI XI ARM (DISPOSABLE) ×4
DRAPE DA VINCI XI COLUMN (DISPOSABLE) ×1
DRAPE HALF SHEET 40X57 (DRAPES) ×2 IMPLANT
DRAPE INCISE IOBAN 66X45 STRL (DRAPES) IMPLANT
DRAPE ORTHO SPLIT 77X108 STRL (DRAPES) ×1
DRAPE SURG ORHT 6 SPLT 77X108 (DRAPES) ×2 IMPLANT
ELECT BLADE 6.5 EXT (BLADE) IMPLANT
ELECT REM PT RETURN 9FT ADLT (ELECTROSURGICAL) ×1
ELECTRODE REM PT RTRN 9FT ADLT (ELECTROSURGICAL) ×2 IMPLANT
GAUZE KITTNER 4X5 RF (MISCELLANEOUS) IMPLANT
GAUZE SPONGE 4X4 12PLY STRL (GAUZE/BANDAGES/DRESSINGS) ×2 IMPLANT
GLOVE BIOGEL PI IND STRL 7.0 (GLOVE) IMPLANT
GLOVE BIOGEL PI IND STRL 7.5 (GLOVE) IMPLANT
GLOVE SS BIOGEL STRL SZ 7.5 (GLOVE) ×2 IMPLANT
GLOVE SURG SS PI 6.0 STRL IVOR (GLOVE) IMPLANT
GLOVE SURG SS PI 6.5 STRL IVOR (GLOVE) IMPLANT
GLOVE SURG SS PI 7.0 STRL IVOR (GLOVE) IMPLANT
GLOVE SURG SS PI 7.5 STRL IVOR (GLOVE) IMPLANT
GOWN STRL REUS W/ TWL LRG LVL3 (GOWN DISPOSABLE) ×4 IMPLANT
GOWN STRL REUS W/ TWL XL LVL3 (GOWN DISPOSABLE) ×4 IMPLANT
GOWN STRL REUS W/TWL 2XL LVL3 (GOWN DISPOSABLE) ×2 IMPLANT
GOWN STRL REUS W/TWL LRG LVL3 (GOWN DISPOSABLE) ×2
GOWN STRL REUS W/TWL XL LVL3 (GOWN DISPOSABLE) ×2
HEMOSTAT SURGICEL 2X14 (HEMOSTASIS) ×8 IMPLANT
IRRIGATION STRYKERFLOW (MISCELLANEOUS) ×2 IMPLANT
IRRIGATOR STRYKERFLOW (MISCELLANEOUS) ×1
KIT BASIN OR (CUSTOM PROCEDURE TRAY) ×2 IMPLANT
NDL HYPO 25GX1X1/2 BEV (NEEDLE) ×2 IMPLANT
NEEDLE HYPO 25GX1X1/2 BEV (NEEDLE) ×1 IMPLANT
NS IRRIG 1000ML POUR BTL (IV SOLUTION) ×2 IMPLANT
PACK CHEST (CUSTOM PROCEDURE TRAY) ×2 IMPLANT
PAD ARMBOARD 7.5X6 YLW CONV (MISCELLANEOUS) ×4 IMPLANT
PORT ACCESS TROCAR AIRSEAL 12 (TROCAR) IMPLANT
RELOAD STAPLE 45 2.5 WHT DVNC (STAPLE) IMPLANT
RELOAD STAPLE 45 3.5 BLU DVNC (STAPLE) IMPLANT
RELOAD STAPLE 45 4.3 GRN DVNC (STAPLE) IMPLANT
RELOAD STAPLE 45 4.6 BLK DVNC (STAPLE) IMPLANT
RELOAD STAPLER 2.5X45 WHT DVNC (STAPLE) ×1 IMPLANT
RELOAD STAPLER 3.5X45 BLU DVNC (STAPLE) ×3 IMPLANT
RELOAD STAPLER 4.3X45 GRN DVNC (STAPLE) ×3 IMPLANT
RELOAD STAPLER 45 4.6 BLK DVNC (STAPLE) ×5 IMPLANT
SCISSORS LAP 5X35 DISP (ENDOMECHANICALS) IMPLANT
SEAL CANN UNIV 5-8 DVNC XI (MISCELLANEOUS) ×4 IMPLANT
SEAL XI 5MM-8MM UNIVERSAL (MISCELLANEOUS) ×2
SET TRI-LUMEN FLTR TB AIRSEAL (TUBING) ×2 IMPLANT
SHEARS HARMONIC HDI 20CM (ELECTROSURGICAL) IMPLANT
SOLUTION ELECTROLUBE (MISCELLANEOUS) ×2 IMPLANT
SPONGE INTESTINAL PEANUT (DISPOSABLE) IMPLANT
SPONGE TONSIL TAPE 1 RFD (DISPOSABLE) IMPLANT
STAPLER 45 SUREFORM CVD (STAPLE) ×1
STAPLER 45 SUREFORM CVD DVNC (STAPLE) IMPLANT
STAPLER CANNULA SEAL DVNC XI (STAPLE) ×4 IMPLANT
STAPLER CANNULA SEAL XI (STAPLE) ×2
STAPLER RELOAD 2.5X45 WHITE (STAPLE) ×1
STAPLER RELOAD 2.5X45 WHT DVNC (STAPLE) ×1
STAPLER RELOAD 3.5X45 BLU DVNC (STAPLE) ×3
STAPLER RELOAD 3.5X45 BLUE (STAPLE) ×3
STAPLER RELOAD 4.3X45 GREEN (STAPLE) ×3
STAPLER RELOAD 4.3X45 GRN DVNC (STAPLE) ×3
STAPLER RELOAD 45 4.6 BLK (STAPLE) ×5
STAPLER RELOAD 45 4.6 BLK DVNC (STAPLE) ×5
SUT PDS AB 3-0 SH 27 (SUTURE) IMPLANT
SUT PROLENE 4 0 RB 1 (SUTURE)
SUT PROLENE 4-0 RB1 .5 CRCL 36 (SUTURE) IMPLANT
SUT SILK  1 MH (SUTURE) ×2
SUT SILK 1 MH (SUTURE) ×4 IMPLANT
SUT SILK 2 0 SH (SUTURE) IMPLANT
SUT SILK 2 0SH CR/8 30 (SUTURE) IMPLANT
SUT SILK 3 0SH CR/8 30 (SUTURE) IMPLANT
SUT VIC AB 1 CTX 36 (SUTURE) ×2
SUT VIC AB 1 CTX36XBRD ANBCTR (SUTURE) IMPLANT
SUT VIC AB 2-0 CTX 36 (SUTURE) IMPLANT
SUT VIC AB 3-0 X1 27 (SUTURE) ×2 IMPLANT
SUT VICRYL 0 TIES 12 18 (SUTURE) ×2 IMPLANT
SUT VICRYL 0 UR6 27IN ABS (SUTURE) ×4 IMPLANT
SUT VICRYL 2 TP 1 (SUTURE) IMPLANT
SYR 20CC LL (SYRINGE) ×4 IMPLANT
SYSTEM RETRIEVAL ANCHOR 8 (MISCELLANEOUS) IMPLANT
SYSTEM SAHARA CHEST DRAIN ATS (WOUND CARE) ×2 IMPLANT
TAPE CLOTH 4X10 WHT NS (GAUZE/BANDAGES/DRESSINGS) ×2 IMPLANT
TAPE CLOTH SURG 4X10 WHT LF (GAUZE/BANDAGES/DRESSINGS) IMPLANT
TIP APPLICATOR SPRAY EXTEND 16 (VASCULAR PRODUCTS) IMPLANT
TOWEL GREEN STERILE (TOWEL DISPOSABLE) ×2 IMPLANT
TRAY FOLEY MTR SLVR 16FR STAT (SET/KITS/TRAYS/PACK) ×2 IMPLANT
WATER STERILE IRR 1000ML POUR (IV SOLUTION) ×2 IMPLANT

## 2022-07-02 NOTE — H&P (Signed)
Emily Santiago is an 62 y.o. female.   Chief Complaint: cough HPI: 62 y.o. woman with PMH tobacco abuse, emphysema, HTN whom we are seeing in follow up of cough, DOE, lung nodule. No particular complaint today. Presents for dye-marking/RATS resection of stage IA NSCLC.  Past Medical History:  Diagnosis Date   ADHD (attention deficit hyperactivity disorder)    Allergies    Anxiety    Aortic atherosclerosis (HCC)    Arthritis    WRIST AND BACK   Asthma    Atrophic vaginitis    CKD (chronic kidney disease), stage II    Complication of anesthesia    slow to wake up   COPD (chronic obstructive pulmonary disease) (HCC)    pt denies this dx   COVID 2022   states it was a bad case, not hospitalized   COVID-19    Depression    Diverticulosis    Dyspnea    Emphysema/COPD (HCC)    Family history of adverse reaction to anesthesia    mother was hard to wake up, acted weird afterwards   Fibromyalgia    Gastric ulcer    GERD (gastroesophageal reflux disease)    History of Barrett's esophagus    History of head and neck cancer    History of kidney stones    Hx of colonic polyps    Hyperlipidemia    Hypertension    Insomnia    Lymphoma (HCC)    Lymphoma   Melanosis coli    Osteopenia    Osteoporosis    Plantar fasciitis    Tobacco abuse    Tubular adenoma of colon    Vallecular mass    Varicose veins    Vitamin D deficiency     Past Surgical History:  Procedure Laterality Date   ABDOMINAL HYSTERECTOMY     APPENDECTOMY     BILATERAL SALPINGOOPHORECTOMY     BIOPSY  02/25/2022   Procedure: BIOPSY;  Surgeon: Sherrilyn Rist, MD;  Location: [facility];  Service: Gastroenterology;;   BRONCHIAL BIOPSY  05/03/2022   Procedure: BRONCHIAL BIOPSIES;  Surgeon: Josephine Igo, DO;  Location: [facility];  Service: Pulmonary;;   BRONCHIAL BRUSHINGS  05/03/2022   Procedure: BRONCHIAL BRUSHINGS;  Surgeon: Josephine Igo, DO;  Location: [facility];  Service: Pulmonary;;    CHOLECYSTECTOMY     COLONOSCOPY WITH PROPOFOL N/A 02/25/2022   Procedure: COLONOSCOPY WITH PROPOFOL;  Surgeon: Sherrilyn Rist, MD;  Location: [facility];  Service: Gastroenterology;  Laterality: N/A;   ESOPHAGEAL MANOMETRY N/A 04/28/2016   Procedure: ESOPHAGEAL MANOMETRY (EM);  Surgeon: Napoleon Form, MD;  Location: [facility];  Service: Endoscopy;  Laterality: N/A;  dr. Myrtie Neither   ESOPHAGOGASTRODUODENOSCOPY (EGD) WITH PROPOFOL N/A 02/25/2022   Procedure: ESOPHAGOGASTRODUODENOSCOPY (EGD) WITH PROPOFOL;  Surgeon: Sherrilyn Rist, MD;  Location: [facility];  Service: Gastroenterology;  Laterality: N/A;   KNEE ARTHROSCOPY     LYMPH NODE BIOPSY     TONSILLECTOMY     UPPER GASTROINTESTINAL ENDOSCOPY     WRIST SURGERY Right     Family History  Problem Relation Age of Onset   Heart disease Father    Colon cancer Father        dx in his 53's   Diabetes Father        metastatic   Colon polyps Father    Heart disease Maternal Grandmother    Breast cancer Maternal Grandmother    Prostate cancer Maternal Uncle  Brain cancer Brother    Liver cancer Brother    Bone cancer Brother    Prostate cancer Brother    Hyperlipidemia Mother    Colon polyps Mother    Breast cancer Cousin        x3   Stomach cancer Neg Hx    Pancreatic cancer Neg Hx    Esophageal cancer Neg Hx    Rectal cancer Neg Hx    Social History:  reports that she quit smoking about 2 years ago. Her smoking use included cigarettes. She has never used smokeless tobacco. She reports that she does not drink alcohol and does not use drugs.  Allergies:  Allergies  Allergen Reactions   Hydrocodone-Acetaminophen Anaphylaxis    Throat/tongue swells   Penicillins Nausea Only and Rash    Stomach upset Has patient had a PCN reaction causing immediate rash, facial/tongue/throat swelling, SOB or lightheadedness with hypotension: No Has patient had a PCN reaction causing severe rash involving mucus membranes or skin  necrosis: Yes Has patient had a PCN reaction that required hospitalization: NO Has patient had a PCN reaction occurring within the last 10 years: Yes If all of the above answers are "NO", then may proceed with Cephalosporin use.     Zestril [Lisinopril] Cough   Latex Itching    Medications Prior to Admission  Medication Sig Dispense Refill   amLODipine (NORVASC) 5 MG tablet Take 5 mg by mouth in the morning.     buPROPion (WELLBUTRIN SR) 150 MG 12 hr tablet Take 150 mg by mouth in the morning.     Cholecalciferol (DIALYVITE VITAMIN D 5000) 125 MCG (5000 UT) capsule Take 5,000 Units by mouth at bedtime.     Cyanocobalamin (VITAMIN B-12 PO) Take 1 tablet by mouth in the morning.     diclofenac (VOLTAREN) 50 MG EC tablet Take 50 mg by mouth in the morning.     esomeprazole (NEXIUM) 40 MG capsule Take 40 mg by mouth daily before breakfast.     fluticasone (FLONASE) 50 MCG/ACT nasal spray Place 1 spray into both nostrils daily as needed for allergies or rhinitis.     LORazepam (ATIVAN) 0.5 MG tablet Take 0.25 mg by mouth 2 (two) times daily as needed for anxiety.     Multiple Vitamin (MULTIVITAMIN WITH MINERALS) TABS tablet Take 1 tablet by mouth in the morning.     potassium chloride (KLOR-CON M) 10 MEQ tablet Take 10 mEq by mouth every other day. In the morning     rosuvastatin (CRESTOR) 10 MG tablet Take 10 mg by mouth every Monday, Wednesday, and Friday. In the morning     salmeterol (SEREVENT DISKUS) 50 MCG/ACT diskus inhaler Inhale 1 puff into the lungs 2 (two) times daily. 1 each 4   telmisartan (MICARDIS) 40 MG tablet Take 40 mg by mouth at bedtime.     traZODone (DESYREL) 50 MG tablet Take 50 mg by mouth at bedtime.     zolpidem (AMBIEN) 10 MG tablet Take 10 mg by mouth at bedtime as needed for sleep.      Results for orders placed or performed during the hospital encounter of 07/02/22 (from the past 48 hour(s))  ABO/Rh     Status: None (Preliminary result)   Collection Time:  07/02/22  5:55 AM  Result Value Ref Range   ABO/RH(D) PENDING    CT Super D Chest Wo Contrast  Result Date: 06/30/2022 CLINICAL DATA:  Pulmonary nodule. History of lymphoma, emphysema and asthma EXAM: CT CHEST  WITHOUT CONTRAST TECHNIQUE: Multidetector CT imaging of the chest was performed using thin slice collimation for electromagnetic bronchoscopy planning purposes, without intravenous contrast. RADIATION DOSE REDUCTION: This exam was performed according to the departmental dose-optimization program which includes automated exposure control, adjustment of the mA and/or kV according to patient size and/or use of iterative reconstruction technique. COMPARISON:  CT 03/31/2022 and older.  PET-CT 05/27/2022 FINDINGS: Cardiovascular: Trace pericardial fluid or thickening. Heart is nonenlarged. On this non IV contrast exam the thoracic aorta has a normal course and caliber with mild scattered vascular calcifications. Coronary artery calcifications are seen. Mediastinum/Nodes: On this non IV contrast exam there is no specific abnormal lymph node enlargement present in the axillary regions, hilum or mediastinum. Normal caliber thoracic esophagus. Lungs/Pleura: Slight linear opacity at the lung bases likely scar or atelectasis. No consolidation, pneumothorax or effusion. Few air cysts scattered in the right lung. There is a metallic focus in the right lower lobe medially on series 8, image 73 with an adjacent small spiculated ill-defined nodule. Previously this nodule measured 7 x 9 mm and today on image 76 8 x 6 mm. There is some reticulonodular changes identified along the extreme inferolateral right upper lobe such as axial image 77 of series 8, unchanged from previous. Similar stable areas along the inferior medial middle lobe. No new dominant nodule identified. Upper Abdomen: In the upper abdomen the adrenal glands are preserved. Previous cholecystectomy. Musculoskeletal: Curvature and degenerative changes noted  along the spine. Slight wedge deformity seen of the T12 level with sclerotic margins, likely chronic and unchanged from prior. IMPRESSION: Essentially stable nodule in the right lower lobe with adjacent clip. Subtle reticulonodular changes in the inferolateral aspect of the right upper lobe and middle lobe are stable. Aortic Atherosclerosis (ICD10-I70.0) and Emphysema (ICD10-J43.9). Electronically Signed   By: Karen Kays M.D.   On: 06/30/2022 19:57    12 point review of systems is negative except as in HPI  Blood pressure 128/86, pulse 72, temperature 98 F (36.7 C), temperature source Oral, resp. rate 18, height 5' (1.524 m), weight 59 kg, SpO2 94 %.  General appearance: 62 y.o., female, NAD, conversant  Eyes: anicteric sclerae; PERRL, tracking appropriately HENT: NCAT; MMM Neck: Trachea midline; no lymphadenopathy, no JVD Lungs: CTAB, no crackles, no wheeze, with normal respiratory effort CV: RRR, no murmur  Abdomen: Soft, non-tender; non-distended, BS present  Extremities: No peripheral edema, warm Skin: Normal turgor and texture; no rash Psych: Appropriate affect Neuro: Alert and oriented to person and place, no focal deficit    Assessment/Plan # RLL stage IA NSCLC - combination dye-marking/RATS resection under general  Omar Person, MD 07/02/2022, 7:05 AM

## 2022-07-02 NOTE — Op Note (Signed)
Video Bronchoscopy with Robotic Assisted Bronchoscopic Navigation   Date of Operation: 07/02/2022   Pre-op Diagnosis: Stage IA NSCLC RLL  Post-op Diagnosis: Same  Surgeon: Judithann Graves  Anesthesia: General endotracheal anesthesia  Operation: Flexible video fiberoptic bronchoscopy with robotic assistance and biopsies.  Estimated Blood Loss: Minimal  Complications: None  Indications and History: Emily Santiago is a 62 y.o. female with history of RLL stage IA NSCLC. The risks, benefits, complications, treatment options and expected outcomes were discussed with the patient.  The possibilities of pneumothorax, pneumonia, reaction to medication, pulmonary aspiration, perforation of a viscus, bleeding, failure to diagnose a condition and creating a complication requiring transfusion or operation were discussed with the patient who freely signed the consent.    Description of Procedure: The patient was seen in the Preoperative Area, was examined and was deemed appropriate to proceed.  The patient was taken to [facility] endoscopy room 3, identified as Emily Santiago and the procedure verified as Flexible Video Fiberoptic Bronchoscopy.  A Time Out was held and the above information confirmed.   Prior to the date of the procedure a high-resolution CT scan of the chest was performed. Utilizing [employer] software program a virtual tracheobronchial tree was generated to allow the creation of distinct navigation pathways to the patient's parenchymal abnormalities. After being taken to the operating room general anesthesia was initiated and the patient  was orally intubated. The video fiberoptic bronchoscope was introduced via the endotracheal tube and a general inspection was performed which showed normal right and left lung anatomy, aspiration of the bilateral mainstems was completed to remove any remaining secretions. Robotic catheter inserted into patient's endotracheal tube.   Target #1 RLL nodule: The distinct  navigation pathways prepared prior to this procedure were then utilized to navigate to patient's lesion identified on CT scan. Unable however to register accurately despite several attempts and discussion with representative from [employer]. CIOS imaging was used to aid navigation and confirm ideal location for dye-marking. The robotic catheter was secured into place and the vision probe was withdrawn.  Lesion location was approximated using fluoroscopy. Under fluoroscopic guidance lesion marked with dye.  At the end of the procedure a general airway inspection was performed and there was no evidence of active bleeding. The bronchoscope was removed.  The patient tolerated the procedure well. There was no significant blood loss and there were no obvious complications.   Plans:  The patient will be transferred to OR for RATS resection of RLL nodule. We will review the cytology, pathology and microbiology results with the patient when they become available. Follow up TBD with Dr. Dorris Fetch and Dr. Judeth Horn or myself.

## 2022-07-02 NOTE — Op Note (Unsigned)
NAME: Emily Santiago, Emily Santiago MEDICAL RECORD NO: [id number] ACCOUNT NO: [id number] DATE OF BIRTH: November 18, 1960 FACILITY: [facility] LOCATION: [facility] PHYSICIAN: Revonda Standard. Roxan Hockey, MD  Operative Report   DATE OF PROCEDURE: 07/02/2022  PREOPERATIVE DIAGNOSIS:  Adenocarcinoma superior segment right lower lobe, clinical stage IA (T1, N0).  POSTOPERATIVE DIAGNOSIS:  Adenocarcinoma superior segment right lower lobe, clinical stage IA (T1, N0).  PROCEDURE:  Xi robotic-assisted right lower lobe segmentectomy, lymph node dissection and intercostal nerve blocks levels 3 through 10.  SURGEON:  Revonda Standard. Roxan Hockey, MD  ASSISTANT:  Huntley Dec, M.D.  ANESTHESIA:  General.  FINDINGS:  Benign-appearing lymph nodes, marked site clearly visible with Firefly.  Specimen confirmed to contain tumor both by palpation and x-ray, additional margin negative for tumor.  CLINICAL NOTE:  The patient is a 62 year old woman with a history of tobacco abuse.  She was found to have a nodule on a low dose CT for lung cancer screening.  Robotic bronchoscopy and biopsy showed adenocarcinoma.  Fiducial was placed at the time of  bronchoscopy.  She was offered surgical resection versus stereotactic radiotherapy and ultimately opted for surgical resection.  The indications, risks, benefits, and alternatives were discussed in detail with the patient.  She understood and accepted  the risks and agreed to proceed. Due to the small size of the nodule plan was for navigational bronchoscopy by Dr. Bjorn Loser to mark the nodule prior to resection.  OPERATIVE NOTE:  The patient was brought to the preoperative holding area on 07/02/2022.  Anesthesia placed an arterial blood pressure monitoring line and established intravenous access.  She was taken to the endoscopy suite and underwent navigational  bronchoscopy and tumor marking by Dr. Bjorn Loser.  Please see his separately dictated note for details.  After marking, the patient was transported to the  operating room, still under anesthesia.  The single lumen endotracheal tube was switched out for a double lumen endotracheal tube.  Intravenous antibiotics were administered.  A Foley catheter was placed.   Sequential compression devices were placed on the calves for DVT prophylaxis.  She was placed in a left lateral decubitus position and the right chest was prepped and draped in the usual fashion.  A Bair Hugger was then placed for active warming.  Timeout was performed.  A solution containing 20 mL of liposomal bupivacaine, 30 mL of 0.5% bupivacaine and 50 mL of saline was prepared and used for local at the incision sites as well as for the intercostal nerve blocks.  An incision was made in the eighth interspace and an 8 mm port was inserted.  The thoracoscope was advanced into the chest. After confirming intrapleural placement the carbon dioxide was insufflated per protocol.  A 12 mm port was placed in the eighth  interspace anterior to the camera port.  Intercostal nerve blocks then were performed from the third to the tenth interspace by injecting 10 mL of the bupivacaine solution into a subpleural plane at each level. A 12 mm AirSeal port was placed in the  tenth interspace posterolaterally.  Two additional eighth interspace ports were placed for the robot.  The robot was deployed.  The camera arm was docked, targeting was performed.  The remaining arms were docked.  Robotic instruments were inserted with  thoracoscopic visualization.  There was good isolation of the right lung, lymph node dissection was initiated.  The lung was retracted superiorly.  The inferior pulmonary ligament was divided with bipolar cautery.  Level 9 node was removed.  All the nodes appeared  grossly benign.   The lung then was retracted anteriorly and the pleural reflection was divided at the posterior aspect of the hilum and a large but otherwise benign-appearing level 7 node was removed.  Initial dissection was begun at  the bifurcation of the upper lobe  bronchus from the bronchus intermedius, but the node there was not immediately apparent.  It was removed later.  Working further superiorly, the pleural reflection was divided between the hilum and the azygos vein.  Exploration of the area revealed no  level 10 node.  The pleura was incised superior to the azygos vein and level 4R nodes were removed.  Attention then was turned to the fissure.  The fissure was almost entirely complete.  It was completed working posteriorly.  A plane was developed over the basilar segmental branch of the pulmonary artery and then developed working posteriorly between  the superior segment and the upper lobe.  A level 12 node then was dissected out the bifurcation of the superior segmental branch from the basilar segmental branch.  The superior segmental pulmonary artery branch was encircled and was divided with the  robotic stapler.  This then allowed access to the level 11 node, which was removed.  Dissection then was begun on the bronchus.  The initial dissection encircling the bronchus and the stapler was placed across the bronchus.  Test inflation showed good  aeration of the remainder of the lower lobe and the stapler was fired transecting the bronchus. It was then apparent that the bronchus had bifurcated almost immediately after its origin and there was a second subsegmental branch to the superior segment  and this likewise was divided with the robotic stapler.  The Firefly setting on the robotic console was used to identify the site marked and the pleura was scored with cautery around that area. Test inflation showed a demarcation in roughly the same area  and there was a rudimentary fissure separating the superficial part of the superior segment from the remaining lung.  The segmentectomy then was completed with sequential firings of the robotic stapler using a combination of blue and black staple  cartridges maintaining an  approximately 2 cm gross margin on the marked site posteriorly near the nodule.  The stapler was biased inferiorly and a portion of the basilar segment was removed along with the nodule and marked as the final margin.  After  completing the segmentectomy the chest was copiously irrigated with warm saline.  A test inflation showed no air leakage.  The vessel loop and sponges that had been placed during the dissection were removed.  An 8 mm endoscopic retrieval bag was placed  through the AirSeal port in the superior segment.  An additional margin were placed into the bag.  The robotic instruments then were removed.  The robot was undocked.  The specimen was removed.  The final margin was sent for frozen section.  No frozen  section was necessary on the nodule since we already had confirmed the diagnosis of adenocarcinoma.  Frozen section subsequently returned with no tumor seen at the margin.  A 28-French Blake drain was placed through the anterior eighth interspace  incision.  Dual lung ventilation was resumed.  The incisions were closed in standard fashion.  It should be noted that an x-ray was taken of the specimen to confirm presence of the fiducial marker near the tumor and the tumor was also palpable.  The patient was placed back in a supine position.  The chest tube was  placed to a Pleur-Evac on  waterseal.  She was extubated in the operating room and taken to the postanesthetic care unit in good condition.  All sponge, needle and instrument counts were correct at the end of the procedure.  Experienced assistance was necessary for this case due to surgical complexity.  Dr. Wilfrid Lund served as the assistant providing assistance with port placement, robot docking and undocking, instrument exchange, specimen retrieval, and wound  closure.   PUS D: 07/02/2022 4:20:12 pm T: 07/02/2022 8:43:00 pm  JOB: [id number]/ [id number]

## 2022-07-02 NOTE — Progress Notes (Signed)
Patient ID: Emily Santiago, female    DOB: [birth year], 62 y.o.   MRN: [id number]  Chief Complaint  Patient presents with   Follow-up    Follow up for adencarcinoma of right lung. Pt brought CT scan disk today. She has surgery for Friday.     Referring provider: Clovis Riley, L.August Saucer, MD  HPI:   [name] is a 62 y.o. woman with PMH tobacco abuse, emphysema, HTN whom we are seeing in follow up of cough, DOE, lung nodule with diagnosis of adenocarcinoma following robotic navigational bronchoscopy 05/03/2022.  Recent cardiothoracic surgery office note reviewed.  Overall, she is doing well.  She has planned VATS resection of cancer in 2 days.  A lot of stress the last couple days given lack of approval for CT scan.  CT super D needed for navigational portion to aid with dye marking given small size of nodule.  Seems like this is in the process of getting approved.  Has been very stressful for her.  She asks if she will be on disability after surgery.  She will resume for a very short period time but do not anticipate long-term disability but we can reassess based on her symptoms post surgery.  HPI at initial visit: History obtained per patient and mother at bedside who frequently interjects.  Patient notes cough is actually been present for many years.  She thinks cough is not worsened over the last 6 months or so.  In the past, she attributed to her smoking.  She reportedly has had many episodes of bronchitis. She has been told she has COPD based on her symptoms and smoking. Never had PFTs. Requires prednisone everythime she has bronchitis. At least once a year.  2 courses this calendar year already to date.   Cough is daily. Worse when eating and drinking. Gets choked on water. Endorses sensation of solids getting stuck in throat. Cough worse when lying supine. Cough is non-productive. Endorses occasional heartburn despite PPI and H2 blocker. Endorses refractory reflux. Reports she has a hiatal  hernia. Had curse of abx with no improvement. Uses Breo and thinks helps cough mildly and gives a little more breath.  She is an every day smoker. 2 ppd for about 40 years. Interested in quitting but seems more pre-contemplative at the moment.   Prior imaging reviewed and interpreted as: CXR 10/2019 - hyperinflated, no infiltrate; CT chest 03/2018 - emphysema, clear lungs.   PMH: HTN, GERD, emphysema Surgical history: Endoscopy, hysterectomy, cholecystectomy Family History: CAD, COPD in father, COPD in brother Social History: every day smoker, lives in [place]  Questionaires / Pulmonary Flowsheets:   ACT:      No data to display           MMRC:     No data to display           Epworth:      No data to display           Tests:   FENO:  No results found for: "NITRICOXIDE"  PFT:    Latest Ref Rng & Units 06/29/2022   10:29 AM 03/26/2020    2:56 PM  PFT Results  FVC-Pre L 2.37  P 2.21   FVC-Predicted Pre % 85  P 74   FVC-Post L 2.54  P 2.49   FVC-Predicted Post % 91  P 84   Pre FEV1/FVC % % 69  P 70   Post FEV1/FCV % % 71  P 72  FEV1-Pre L 1.64  P 1.55   FEV1-Predicted Pre % 77  P 67   FEV1-Post L 1.79  P 1.78   DLCO uncorrected ml/min/mmHg 15.14  P 14.63   DLCO UNC% % 86  P 80   DLCO corrected ml/min/mmHg  14.63   DLCO COR %Predicted %  80   DLVA Predicted % 89  P 81   TLC L 4.53  P 4.73   TLC % Predicted % 101  P 102   RV % Predicted % 114  P 136     P Preliminary result   Personally reviewed and interpreted as spirometry digestive restriction versus air trapping, significant bronchodilator response after all.  Lung volumes within normal limits with the exception of evidence of air trapping.  DLCO within normal limits.  WALK:      No data to display           Imaging: Personally reviewed and as per EMR discussion this note DG Chest Port 1 View  Result Date: 07/02/2022 CLINICAL DATA:  Pneumothorax, chest tube, postop EXAM: PORTABLE  CHEST 1 VIEW COMPARISON:  Portable exam 1333 hours compared to 06/29/2022 FINDINGS: LEFT thoracostomy tube present. RIGHT pneumothorax identified predominantly at apex but also noted at inferior RIGHT hemithorax. Staple line with atelectasis/scarring at RIGHT base. LEFT lung clear. Heart size stable. IMPRESSION: RIGHT pneumothorax despite thoracostomy tube. Postsurgical changes inferior RIGHT lung. Electronically Signed   By: Ulyses Southward M.D.   On: 07/02/2022 14:04   DG ATTEMPTED STUDY-NO REPORT  Result Date: 07/02/2022 There is no Radiologist interpretation  for this exam.  DG C-ARM BRONCHOSCOPY  Result Date: 07/02/2022 C-ARM BRONCHOSCOPY: Fluoroscopy was utilized by the requesting physician.  No radiographic interpretation.   DG Chest 2 View  Result Date: 07/01/2022 CLINICAL DATA:  Preoperative chest. [id number]. Adenocarcinoma of the right lung. EXAM: CHEST - 2 VIEW COMPARISON:  05/03/2022 FINDINGS: Mild hyperinflation suggesting emphysema. Heart size and pulmonary vascularity are normal. Surgical marker in the right medial posterior lung, likely superior segment of the right lower lung. Lungs are clear. No pleural effusions. No pneumothorax. Mediastinal contours appear intact. Degenerative changes in the spine and shoulders. Surgical clips in the right upper quadrant. IMPRESSION: No active cardiopulmonary disease. Electronically Signed   By: Burman Nieves M.D.   On: 07/01/2022 00:01   CT Super D Chest Wo Contrast  Result Date: 06/30/2022 CLINICAL DATA:  Pulmonary nodule. History of lymphoma, emphysema and asthma EXAM: CT CHEST WITHOUT CONTRAST TECHNIQUE: Multidetector CT imaging of the chest was performed using thin slice collimation for electromagnetic bronchoscopy planning purposes, without intravenous contrast. RADIATION DOSE REDUCTION: This exam was performed according to the departmental dose-optimization program which includes automated exposure control, adjustment of the mA and/or kV  according to patient size and/or use of iterative reconstruction technique. COMPARISON:  CT 03/31/2022 and older.  PET-CT 05/27/2022 FINDINGS: Cardiovascular: Trace pericardial fluid or thickening. Heart is nonenlarged. On this non IV contrast exam the thoracic aorta has a normal course and caliber with mild scattered vascular calcifications. Coronary artery calcifications are seen. Mediastinum/Nodes: On this non IV contrast exam there is no specific abnormal lymph node enlargement present in the axillary regions, hilum or mediastinum. Normal caliber thoracic esophagus. Lungs/Pleura: Slight linear opacity at the lung bases likely scar or atelectasis. No consolidation, pneumothorax or effusion. Few air cysts scattered in the right lung. There is a metallic focus in the right lower lobe medially on series 8, image 73 with an adjacent small spiculated ill-defined nodule.  Previously this nodule measured 7 x 9 mm and today on image 76 8 x 6 mm. There is some reticulonodular changes identified along the extreme inferolateral right upper lobe such as axial image 77 of series 8, unchanged from previous. Similar stable areas along the inferior medial middle lobe. No new dominant nodule identified. Upper Abdomen: In the upper abdomen the adrenal glands are preserved. Previous cholecystectomy. Musculoskeletal: Curvature and degenerative changes noted along the spine. Slight wedge deformity seen of the T12 level with sclerotic margins, likely chronic and unchanged from prior. IMPRESSION: Essentially stable nodule in the right lower lobe with adjacent clip. Subtle reticulonodular changes in the inferolateral aspect of the right upper lobe and middle lobe are stable. Aortic Atherosclerosis (ICD10-I70.0) and Emphysema (ICD10-J43.9). Electronically Signed   By: Jill Side M.D.   On: 06/30/2022 19:57    Lab Results: Personally reviewed CBC    Component Value Date/Time   WBC 6.4 06/29/2022 1330   RBC 4.21 06/29/2022 1330    HGB 13.0 06/29/2022 1330   HGB 13.4 04/22/2017 1407   HCT 39.0 06/29/2022 1330   HCT 40.5 04/22/2017 1407   PLT 214 06/29/2022 1330   PLT 219 04/22/2017 1407   MCV 92.6 06/29/2022 1330   MCV 95.1 04/22/2017 1407   MCH 30.9 06/29/2022 1330   MCHC 33.3 06/29/2022 1330   RDW 13.0 06/29/2022 1330   RDW 13.4 04/22/2017 1407   LYMPHSABS 1.8 12/30/2021 1416   LYMPHSABS 1.7 04/22/2017 1407   MONOABS 0.6 12/30/2021 1416   MONOABS 0.4 04/22/2017 1407   EOSABS 0.1 12/30/2021 1416   EOSABS 0.1 04/22/2017 1407   BASOSABS 0.0 12/30/2021 1416   BASOSABS 0.0 04/22/2017 1407    BMET    Component Value Date/Time   NA 136 06/29/2022 1330   NA 137 04/22/2017 1407   K 3.4 (L) 06/29/2022 1330   K 5.0 04/22/2017 1407   CL 106 06/29/2022 1330   CO2 21 (L) 06/29/2022 1330   CO2 24 04/22/2017 1407   GLUCOSE 107 (H) 06/29/2022 1330   GLUCOSE 77 04/22/2017 1407   BUN 17 06/29/2022 1330   BUN 21.6 04/22/2017 1407   CREATININE 0.56 06/29/2022 1330   CREATININE 0.8 04/22/2017 1407   CALCIUM 8.7 (L) 06/29/2022 1330   CALCIUM 9.5 04/22/2017 1407   GFRNONAA >60 06/29/2022 1330   GFRAA >60 12/31/2019 0938    BNP    Component Value Date/Time   BNP 16.5 02/18/2017 1659    ProBNP No results found for: "PROBNP"  Specialty Problems       Pulmonary Problems   Sore throat   Lung nodule   Right lower lobe pulmonary nodule    Allergies  Allergen Reactions   Hydrocodone-Acetaminophen Anaphylaxis    Throat/tongue swells   Penicillins Nausea Only and Rash    Stomach upset Has patient had a PCN reaction causing immediate rash, facial/tongue/throat swelling, SOB or lightheadedness with hypotension: No Has patient had a PCN reaction causing severe rash involving mucus membranes or skin necrosis: Yes Has patient had a PCN reaction that required hospitalization: NO Has patient had a PCN reaction occurring within the last 10 years: Yes If all of the above answers are "NO", then may proceed with  Cephalosporin use.     Zestril [Lisinopril] Cough   Latex Itching    Immunization History  Administered Date(s) Administered   Influenza Split 02/13/2020   Influenza,inj,Quad PF,6+ Mos 06/01/2016, 03/15/2018, 04/15/2021   Influenza,inj,quad, With Preservative 05/17/2013   Moderna Sars-Covid-2 Vaccination  01/14/2020, 02/13/2020, 11/10/2020   PNEUMOCOCCAL CONJUGATE-20 05/20/2021   Pfizer Covid-19 Vaccine Bivalent Booster 87yrs & up 05/20/2021   Zoster, Live 10/14/2021    Past Medical History:  Diagnosis Date   ADHD (attention deficit hyperactivity disorder)    Allergies    Anxiety    Aortic atherosclerosis (HCC)    Arthritis    WRIST AND BACK   Asthma    Atrophic vaginitis    CKD (chronic kidney disease), stage II    Complication of anesthesia    slow to wake up   COPD (chronic obstructive pulmonary disease) (HCC)    pt denies this dx   COVID 2022   states it was a bad case, not hospitalized   COVID-19    Depression    Diverticulosis    Dyspnea    Emphysema/COPD (HCC)    Family history of adverse reaction to anesthesia    mother was hard to wake up, acted weird afterwards   Fibromyalgia    Gastric ulcer    GERD (gastroesophageal reflux disease)    History of Barrett's esophagus    History of head and neck cancer    History of kidney stones    Hx of colonic polyps    Hyperlipidemia    Hypertension    Insomnia    Lymphoma (HCC)    Lymphoma   Melanosis coli    Osteopenia    Osteoporosis    Plantar fasciitis    Tobacco abuse    Tubular adenoma of colon    Vallecular mass    Varicose veins    Vitamin D deficiency     Tobacco History: Social History   Tobacco Use  Smoking Status Former   Years: 39.00   Types: Cigarettes   Quit date: 2022   Years since quitting: 2.0  Smokeless Tobacco Never   Counseling given: Not Answered   Continue to not smoke  No facility-administered encounter medications on file as of 06/30/2022.   Outpatient Encounter  Medications as of 06/30/2022  Medication Sig   amLODipine (NORVASC) 5 MG tablet Take 5 mg by mouth in the morning.   buPROPion (WELLBUTRIN SR) 150 MG 12 hr tablet Take 150 mg by mouth in the morning.   Cholecalciferol (DIALYVITE VITAMIN D 5000) 125 MCG (5000 UT) capsule Take 5,000 Units by mouth at bedtime.   Cyanocobalamin (VITAMIN B-12 PO) Take 1 tablet by mouth in the morning.   diclofenac (VOLTAREN) 50 MG EC tablet Take 50 mg by mouth in the morning.   esomeprazole (NEXIUM) 40 MG capsule Take 40 mg by mouth daily before breakfast.   fluticasone (FLONASE) 50 MCG/ACT nasal spray Place 1 spray into both nostrils daily as needed for allergies or rhinitis.   LORazepam (ATIVAN) 0.5 MG tablet Take 0.25 mg by mouth 2 (two) times daily as needed for anxiety.   Multiple Vitamin (MULTIVITAMIN WITH MINERALS) TABS tablet Take 1 tablet by mouth in the morning.   potassium chloride (KLOR-CON M) 10 MEQ tablet Take 10 mEq by mouth every other day. In the morning   rosuvastatin (CRESTOR) 10 MG tablet Take 10 mg by mouth every Monday, Wednesday, and Friday. In the morning   salmeterol (SEREVENT DISKUS) 50 MCG/ACT diskus inhaler Inhale 1 puff into the lungs 2 (two) times daily.   telmisartan (MICARDIS) 40 MG tablet Take 40 mg by mouth at bedtime.   traZODone (DESYREL) 50 MG tablet Take 50 mg by mouth at bedtime.   zolpidem (AMBIEN) 10 MG tablet Take 10  mg by mouth at bedtime as needed for sleep.     Review of Systems  Review of Systems  N/a  Physical Exam  BP 122/64 (BP Location: Left Arm, Patient Position: Sitting, Cuff Size: Normal)   Pulse 68   Wt 133 lb (60.3 kg)   SpO2 97%   BMI 25.97 kg/m   Wt Readings from Last 5 Encounters:  07/02/22 130 lb (59 kg)  06/30/22 133 lb (60.3 kg)  06/29/22 140 lb (63.5 kg)  06/17/22 138 lb (62.6 kg)  05/12/22 138 lb 3.2 oz (62.7 kg)    BMI Readings from Last 5 Encounters:  07/02/22 25.39 kg/m  06/30/22 25.97 kg/m  06/29/22 27.34 kg/m  06/17/22  27.87 kg/m  05/12/22 27.91 kg/m     Physical Exam General: Well appearing, in NAD Eyes: EOMI, no icterus ENT: No JVP appreciated, voice is mildly hoarse Respiratory: Distant sounds, no wheeze CV: RRR, no murmurs Abdomen/GI: soft, BS present MSK: No synovitis, no joint effusions Neuro: Normal gait, no weakness Psych: Normal mood, flat affect   Assessment & Plan:   Chronic Cough: Suspect multifactorial. Suspect largest contributors are refractory GERD and aspiration with dysphagia as well as hiatal hernia contributing to refractory reflux.  PFTs also consistent with possible asthma with significant bronchodilator response.  Continue Serevent..  Asthma: Based on bronchodilator response on PFT as well as air trapping.  Mild emphysema also present.  Did not tolerate Breo. Reported headache with Anoro and Incruse, possible LAMA side effect.  However I think these headaches are chronic and not related.  She is tolerating LABA therapy, Serevent.  To continue.  Continue as needed albuterol.  Right lower lobe lung nodule, adenocarcinoma of lung: Planned resection this week.  Follow-up postoperatively for symptom changes and management of medicines as needed.  Return in about 6 weeks (around 08/11/2022).   Karren Burly, MD 07/02/2022

## 2022-07-02 NOTE — Discharge Summary (Addendum)
[address]       [place] [address]             [phone]    Physician Discharge Summary  Patient ID: Emily Santiago MRN: [id number] DOB/AGE: 63-May-1962 63 y.o.  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 Patient Active Problem List   Diagnosis Date Noted   Right lower lobe pulmonary nodule 07/02/2022   Lung nodule 05/03/2022   Chronic gastric ulcer without hemorrhage and without perforation    Melena    Personal history of colonic polyps    Boil of neck 10/22/2021   Multiple complaints 12/31/2019   Weight loss 03/23/2018   Family discord 04/22/2017   Insomnia 04/22/2017   Epigastric pain 02/19/2017   Chest pain 02/18/2017   Hypotension due to drugs 02/18/2017   Hypokalemia 02/18/2017   Chronic fatigue 01/21/2017   DJD (degenerative joint disease) of cervical spine 10/15/2016   Dysuria 07/05/2016   Other constipation 06/22/2016   Mucositis due to antineoplastic therapy 06/08/2016   Hypotension 06/03/2016   Sore throat 05/04/2016   Dysphagia 04/22/2016   Gastroesophageal reflux disease 04/22/2016   Follicular lymphoma grade iiia, lymph nodes of head, face, and neck (HCC) 04/21/2016   Essential hypertension 04/21/2016   Tobacco abuse 04/20/2016   Pain of left lower extremity-Thigh / Leg 02/05/2014   Varicose veins of lower extremities with other complications 02/26/2013     Discharge Diagnoses:  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- Clinical and pathologic stage IA- (Tis, N0) Patient Active Problem List   Diagnosis Date Noted   Right lower lobe pulmonary nodule 07/02/2022   Lung nodule 05/03/2022   Chronic gastric ulcer without hemorrhage and without perforation    Melena    Personal history of colonic polyps    Boil of neck 10/22/2021   Multiple complaints 12/31/2019   Weight loss 03/23/2018   Family discord 04/22/2017   Insomnia 04/22/2017    Epigastric pain 02/19/2017   Chest pain 02/18/2017   Hypotension due to drugs 02/18/2017   Hypokalemia 02/18/2017   Chronic fatigue 01/21/2017   DJD (degenerative joint disease) of cervical spine 10/15/2016   Dysuria 07/05/2016   Other constipation 06/22/2016   Mucositis due to antineoplastic therapy 06/08/2016   Hypotension 06/03/2016   Sore throat 05/04/2016   Dysphagia 04/22/2016   Gastroesophageal reflux disease 04/22/2016   Follicular lymphoma grade iiia, lymph nodes of head, face, and neck (HCC) 04/21/2016   Essential hypertension 04/21/2016   Tobacco abuse 04/20/2016   Pain of left lower extremity-Thigh / Leg 02/05/2014   Varicose veins of lower extremities with other complications 02/26/2013     Discharged Condition: stable  HPI: This is a 62 year old woman with a past medical history significant for tobacco abuse, COPD, COVID-19, aortic atherosclerosis, stage III chronic kidney disease, lymphoma, reflux, Barrett's esophagus, fibromyalgia, and osteoporosis.  Recently diagnosed with a clinical stage Ia adenocarcinoma of the right lower lobe diagnosed by navigational bronchoscopy.   I saw her a few weeks ago.  She had a very unclear history.  I think she was extremely anxious that day.  She now returns to further discuss surgical resection.  She has thought over the options of surgery versus stereotactic radiation.  She strongly prefers surgery.   She can walk 2 to 3 miles without stopping on level ground.  Still has a sharp left-sided chest pain when she bends over from  time to time but no anginal type chest pain.  She has lost 20 pounds in 3 months.  Also, complains of cough and shortness of breath with heavy exertion.   Dr. Dorris Fetch described the proposed operative procedure to her.  The plan would be to do a robotic assisted right lower lobe segmentectomy, but she understands there is a possibility we might have to do a lobectomy depending on intraoperative findings.   Potential risks, benefits, and complications of the surgery were discussed with the patient and she agreed to proceed. Given the small size of the nodule and its groundglass nature we need to have that marked prior to surgery to ensure we can get an adequate margin.  It was arranged for Dr. Thora Lance to do a dye marking prior to surgical intervention.  Hospital Course: Patient underwent an Xi robotic assisted right thoracoscopy, RLL segmentectomy, LN dissection, intercostal nerve block. She was extubated and transported from the OR to PACU in stable condition. Chest tube was placed to water seal. Daily chest x rays were obtained and remained stable. The patient did well post operatively.  Her chest tube was free from air leak.  Output was initially too high to remove.  This improved and her chest tube was removed on 07/04/2022.  Her CXR showed trace right apical pneumothorax. She was provided education on importance of deep breathing and IS use.  She remained in NSR.  She has a history of Hypertension, however her blood pressure was too low to resume home Norvasc.  She has been weaned off oxygen.  PT was ordered as she was not ambulating much. Ambulation has improved. Per walk test, we will arrange for home oxygen. .Surgical incisions are healing without evidence of infection.  She is medically stable for discharge home today.  Consults: None  Significant Diagnostic Studies:   CLINICAL DATA:  Chest tube removal.  History of lung nodule.   EXAM: CHEST - 2 VIEW   COMPARISON:  07/04/2022.   FINDINGS: 0622 hours.   Trace right apical pneumothorax with associated subcutaneous emphysema along the right neck and right chest wall.   Stable postoperative changes of right lower lobe wedge resection with persistent opacity, likely contusion, surrounding the chain sutures. Mild blunting of the right costophrenic sulcus. Stable cardiac and mediastinal contours. Left lung is well aerated.   IMPRESSION: 1.  Trace right apical pneumothorax with associated subcutaneous emphysema along the right neck and right chest wall. 2. Stable postoperative changes of right lower lobe wedge resection.     Electronically Signed   By: Orvan Falconer M.D.   On: 07/05/2022 08:08   Narrative & Impression  CLINICAL DATA:  Chest tube removal.   EXAM: CHEST - 1 VIEW SAME DAY   COMPARISON:  07/04/2022 at 5:21 a.m.   FINDINGS: Interval removal of right-sided chest tube. Postsurgical change over the right base/infrahilar region. Patchy density over the right base without significant change. No significant right-sided pneumothorax. Left lung is clear. Cardiomediastinal silhouette and remainder of the exam is unchanged.   IMPRESSION: Postsurgical change over the right base/infrahilar region. Patchy density over the right lung base likely atelectasis, although infection is possible. Interval removal right chest tube. No significant right pneumothorax.     Electronically Signed   By: Elberta Fortis M.D.   On: 07/04/2022 13:50    Treatments: surgery:   Xi robotic-assisted right lower lobe segmentectomy, lymph node dissection and intercostal nerve blocks levels 3 through 10 by Dr. Dorris Fetch on 07/02/2022.  Pathology:  RIGHT LUNG, LOWER LOBE, FINAL MARGIN, RESECTION:  Benign alveolar laded lung with focal granulomatous inflammation  Negative for carcinoma   B. RIGHT LUNG, LOWER LOBE, SEGMENTECTOMY:  Well-differentiated adenocarcinoma, lepidic pattern (pTis)  Tumor measures 0.8 cm in greatest dimension (estimated)  Margins free   TNM Code: pTis, pN0   Discharge Exam: Blood pressure 124/73, pulse 85, temperature 98.1 F (36.7 C), temperature source Oral, resp. rate 19, height 5' (1.524 m), weight 59 kg, SpO2 90 %. Cardiovascular: RRR Pulmonary: Clear to auscultation bilaterally Abdomen: Soft, non tender, bowel sounds present. Extremities: No LE edema Wounds: Clean and dry.  No erythema or signs  of infection. Chest tube wound has no active drainage this am   Discharge Medications:    Allergies as of 07/06/2022       Reactions   Hydrocodone-acetaminophen Anaphylaxis   Throat/tongue swells   Penicillins Nausea Only, Rash   Stomach upset Has patient had a PCN reaction causing immediate rash, facial/tongue/throat swelling, SOB or lightheadedness with hypotension: No Has patient had a PCN reaction causing severe rash involving mucus membranes or skin necrosis: Yes Has patient had a PCN reaction that required hospitalization: NO Has patient had a PCN reaction occurring within the last 10 years: Yes If all of the above answers are "NO", then may proceed with Cephalosporin use.   Zestril [lisinopril] Cough   Latex Itching        Medication List     TAKE these medications    amLODipine 5 MG tablet Commonly known as: NORVASC Take 5 mg by mouth in the morning.   buPROPion 150 MG 12 hr tablet Commonly known as: WELLBUTRIN SR Take 150 mg by mouth in the morning.   Dialyvite Vitamin D 5000 125 MCG (5000 UT) capsule Generic drug: Cholecalciferol Take 5,000 Units by mouth at bedtime.   diclofenac 50 MG EC tablet Commonly known as: VOLTAREN Take 50 mg by mouth in the morning.   esomeprazole 40 MG capsule Commonly known as: NEXIUM Take 40 mg by mouth daily before breakfast.   fluticasone 50 MCG/ACT nasal spray Commonly known as: FLONASE Place 1 spray into both nostrils daily as needed for allergies or rhinitis.   gabapentin 300 MG capsule Commonly known as: NEURONTIN Take 1 capsule (300 mg total) by mouth at bedtime.   LORazepam 0.5 MG tablet Commonly known as: ATIVAN Take 0.25 mg by mouth 2 (two) times daily as needed for anxiety.   multivitamin with minerals Tabs tablet Take 1 tablet by mouth in the morning.   oxyCODONE 5 MG immediate release tablet Commonly known as: Oxy IR/ROXICODONE Take 1 tablet (5 mg total) by mouth every 6 (six) hours as needed for  severe pain.   potassium chloride 10 MEQ tablet Commonly known as: KLOR-CON M Take 10 mEq by mouth every other day. In the morning   rosuvastatin 10 MG tablet Commonly known as: CRESTOR Take 10 mg by mouth every Monday, Wednesday, and Friday. In the morning   Serevent Diskus 50 MCG/ACT diskus inhaler Generic drug: salmeterol Inhale 1 puff into the lungs 2 (two) times daily.   telmisartan 40 MG tablet Commonly known as: MICARDIS Take 40 mg by mouth at bedtime.   traZODone 50 MG tablet Commonly known as: DESYREL Take 50 mg by mouth at bedtime.   VITAMIN B-12 PO Take 1 tablet by mouth in the morning.   zolpidem 10 MG tablet Commonly known as: AMBIEN Take 10 mg by mouth at bedtime as needed for sleep.  Durable Medical Equipment  (From admission, onward)           Start     Ordered   07/06/22 1151  For home use only DME oxygen  Once       Question Answer Comment  Length of Need 6 Months   Mode or (Route) Nasal cannula   Liters per Minute 2   Frequency Continuous (stationary and portable oxygen unit needed)   Oxygen delivery system Gas      07/06/22 1151   07/05/22 0731  For home use only DME Hospital bed  Once       Question Answer Comment  Length of Need 6 Months   The above medical condition requires: Patient requires the ability to reposition frequently   Head must be elevated greater than: 45 degrees   Bed type Semi-electric      07/05/22 0731            Follow-up Information     [facility] Follow up.   Why: PA/LAT CXR to be taken at least one hour prior to office appointment. Please arrive by 8:00 am Contact information: [address] [place] [address]        Loreli Slot, MD. Go on 07/20/2022.   Specialty: Cardiothoracic Surgery Why: Appointment time is at 9:15 am Contact information: [address] [place] [address] [phone]         [facility]. Go on 07/14/2022.   Specialty: Cardiothoracic Surgery Why: Appointment is with nurse for chest tube suture removal only. Appointment time is at 11:30 am Contact information: [address] [place], [address] [place] [address] [phone]                Signed:  Elenore Rota  07/06/2022, 11:51 AM

## 2022-07-02 NOTE — Discharge Instructions (Signed)
obot-Assisted Thoracic Surgery, Care After The following information offers guidance on how to care for yourself after your procedure. Your health care provider may also give you more specific instructions. If you have problems or questions, contact your health care provider. What can I expect after the procedure? After the procedure, it is common to have: Some pain and aches in the area of your surgical incisions. Pain when breathing in (inhaling) and coughing. Tiredness (fatigue). Trouble sleeping. Constipation. Follow these instructions at home: Medicines Take over-the-counter and prescription medicines only as told by your health care provider. If you were prescribed an antibiotic medicine, take it as told by your health care provider. Do not stop taking the antibiotic even if you start to feel better. Talk with your health care provider about safe and effective ways to manage pain after your procedure. Pain management should fit your specific health needs. Take pain medicine before pain becomes severe. Relieving and controlling your pain will make breathing easier for you. Ask your health care provider if the medicine prescribed to you requires you to avoid driving or using machinery. Eating and drinking Follow instructions from your health care provider about eating or drinking restrictions. These will vary depending on what procedure you had. Your health care provider may recommend: A liquid diet or soft diet for the first few days. Meals that are smaller and more frequent. A diet of fruits, vegetables, whole grains, and low-fat proteins. Limiting foods that are high in fat and processed sugar, including fried or sweet foods. Incision care Follow instructions from your health care provider about how to take care of your incisions. Make sure you: Wash your hands with soap and water for at least 20 seconds before and after you change your bandage (dressing). If soap and water are not  available, use hand sanitizer. Change your dressing as told by your health care provider. Leave stitches (sutures), skin glue, or adhesive strips in place. These skin closures may need to stay in place for 2 weeks or longer. If adhesive strip edges start to loosen and curl up, you may trim the loose edges. Do not remove adhesive strips completely unless your health care provider tells you to do that. Check your incision area every day for signs of infection. Check for: Redness, swelling, or more pain. Fluid or blood. Warmth. Pus or a bad smell. Activity Return to your normal activities as told by your health care provider. Ask your health care provider what activities are safe for you. Ask your health care provider when it is safe for you to drive. Do not lift anything that is heavier than 10 lb (4.5 kg), or the limit that you are told, until your health care provider says that it is safe. Rest as told by your health care provider. Avoid sitting for a long time without moving. Get up to take short walks every 1-2 hours. This is important to improve blood flow and breathing. Ask for help if you feel weak or unsteady. Do exercises as told by your health care provider. Pneumonia prevention  Do deep breathing exercises and cough regularly as directed. This helps clear mucus and opens your lungs. Doing this helps prevent lung infection (pneumonia). If you were given an incentive spirometer, use it as told. An incentive spirometer is a tool that measures how well you are filling your lungs with each breath. Coughing may hurt less if you try to support your chest. This is called splinting. Try one of these when you  cough: Hold a pillow against your chest. Place the palms of both hands on top of your incision area. Do not use any products that contain nicotine or tobacco. These products include cigarettes, chewing tobacco, and vaping devices, such as e-cigarettes. If you need help quitting, ask your  health care provider. Avoid secondhand smoke. General instructions If you have a drainage tube: Follow instructions from your health care provider about how to take care of it. Do not travel by airplane after your tube is removed until your health care provider tells you it is safe. You may need to take these actions to prevent or treat constipation: Drink enough fluid to keep your urine pale yellow. Take over-the-counter or prescription medicines. Eat foods that are high in fiber, such as beans, whole grains, and fresh fruits and vegetables. Limit foods that are high in fat and processed sugars, such as fried or sweet foods. Keep all follow-up visits. This is important. Contact a health care provider if: You have redness, swelling, or more pain around an incision. You have fluid or blood coming from an incision. An incision feels warm to the touch. You have pus or a bad smell coming from an incision. You have a fever. You cannot eat or drink without vomiting. Your pain medicine is not controlling your pain. Get help right away if: You have chest pain. Your heart is beating quickly. You have trouble breathing. You have trouble speaking. You are confused. You feel weak or dizzy, or you faint. These symptoms may represent a serious problem that is an emergency. Do not wait to see if the symptoms will go away. Get medical help right away. Call your local emergency services (911 in the [place] Do not drive yourself to the hospital. Summary Talk with your health care provider about safe and effective ways to manage pain after your procedure. Pain management should fit your specific health needs. Return to your normal activities as told by your health care provider. Ask your health care provider what activities are safe for you. Do deep breathing exercises and cough regularly as directed. This helps to clear mucus and prevent pneumonia. If it hurts to cough, ease pain by holding a pillow  against your chest or by placing the palms of both hands over your incisions. This information is not intended to replace advice given to you by your health care provider. Make sure you discuss any questions you have with your health care provider. Document Revised: 02/22/2020 Document Reviewed: 02/22/2020 Elsevier Patient Education  2023 [employer].

## 2022-07-02 NOTE — Anesthesia Procedure Notes (Signed)
Procedure Name: Intubation Date/Time: 07/02/2022 9:11 AM  Performed by: Lorie Phenix, CRNAPre-anesthesia Checklist: Patient identified, Emergency Drugs available, Suction available and Patient being monitored Oxygen Delivery Method: Circle system utilized Preoxygenation: Pre-oxygenation with 100% oxygen Laryngoscope Size: Mac and 4 Grade View: Grade II Tube type: Oral Endobronchial tube: Right, Double lumen EBT and EBT position confirmed by fiberoptic bronchoscope and 35 Fr Number of attempts: 1 Airway Equipment and Method: Stylet and Fiberoptic brochoscope Placement Confirmation: ETT inserted through vocal cords under direct vision, positive ETCO2 and breath sounds checked- equal and bilateral Secured at: 27 cm Tube secured with: Tape Dental Injury: Teeth and Oropharynx as per pre-operative assessment

## 2022-07-02 NOTE — Plan of Care (Signed)

## 2022-07-02 NOTE — Brief Op Note (Signed)
07/02/2022  3:42 PM  PATIENT:  Emily Santiago  62 y.o. female  PRE-OPERATIVE DIAGNOSIS:  RLL ADENOCARCINOMA- CLINICAL STAGE IA  POST-OPERATIVE DIAGNOSIS:  RLL ADENOCARCINOMA- CLINICAL STAGE IA  PROCEDURE:  Procedure(s): XI ROBOTIC ASSISTED THORACOSCOPY-RIGHT LOWER LOBE SEGMENTECTOMY (Right) LYMPH NODE DISSECTION INTERCOSTAL NERVE BLOCKS - levels 3-10  SURGEON:  Surgeon(s) and Role:    * Loreli Slot, MD - Primary  PHYSICIAN ASSISTANT:   ASSISTANTS: Dimitrios Moris, MD   ANESTHESIA:   general  EBL:  110 mL   BLOOD ADMINISTERED:none  DRAINS:  1 Chest Tube(s) in the chest    LOCAL MEDICATIONS USED:  MARCAINE     SPECIMEN:  Source of Specimen:  RLL superior segement, LN  DISPOSITION OF SPECIMEN:  PATHOLOGY  COUNTS:  YES  TOURNIQUET:  * No tourniquets in log *  DICTATION: .Other Dictation: Dictation Number -  PLAN OF CARE: Admit to inpatient   PATIENT DISPOSITION:  PACU - hemodynamically stable.   Delay start of Pharmacological VTE agent (>24hrs) due to surgical blood loss or risk of bleeding: no

## 2022-07-02 NOTE — Anesthesia Procedure Notes (Signed)
Arterial Line Insertion Start/End1/19/2024 9:18 AM, 07/02/2022 9:20 AM Performed by: CRNA  Patient location: OR. Preanesthetic checklist: patient identified, IV checked, site marked, risks and benefits discussed, surgical consent, monitors and equipment checked, pre-op evaluation, timeout performed and anesthesia consent Patient sedated Right, radial was placed Catheter size: 20 G Hand hygiene performed  and maximum sterile barriers used   Attempts: 1 Procedure performed without using ultrasound guided technique. Following insertion, Biopatch and dressing applied. Post procedure assessment: normal  Patient tolerated the procedure well with no immediate complications.

## 2022-07-02 NOTE — Transfer of Care (Signed)
Immediate Anesthesia Transfer of Care Note  Patient: Emily Santiago  Procedure(s) Performed: ROBOTIC ASSISTED NAVIGATIONAL BRONCHOSCOPY FIDUCIAL DYE MARKING  Patient Location: PACU  Anesthesia Type:General  Level of Consciousness: drowsy  Airway & Oxygen Therapy: Patient Spontanous Breathing  Post-op Assessment: Report given to RN and Post -op Vital signs reviewed and stable  Post vital signs: Reviewed and stable  Last Vitals:  Vitals Value Taken Time  BP 123/80 07/02/22 1231  Temp    Pulse 76 07/02/22 1234  Resp 13 07/02/22 1234  SpO2 95 % 07/02/22 1234  Vitals shown include unvalidated device data.  Last Pain:  Vitals:   07/02/22 0554  TempSrc:   PainSc: 0-No pain      Patients Stated Pain Goal: 0 (07/02/22 0554)  Complications: No notable events documented.

## 2022-07-02 NOTE — Hospital Course (Addendum)
HPI: This is a 62 year old woman with a past medical history significant for tobacco abuse, COPD, COVID-19, aortic atherosclerosis, stage III chronic kidney disease, lymphoma, reflux, Barrett's esophagus, fibromyalgia, and osteoporosis.  Recently diagnosed with a clinical stage Ia adenocarcinoma of the right lower lobe diagnosed by navigational bronchoscopy.   I saw her a few weeks ago.  She had a very unclear history.  I think she was extremely anxious that day.  She now returns to further discuss surgical resection.  She has thought over the options of surgery versus stereotactic radiation.  She strongly prefers surgery.   She can walk 2 to 3 miles without stopping on level ground.  Still has a sharp left-sided chest pain when she bends over from time to time but no anginal type chest pain.  She has lost 20 pounds in 3 months.  Also, complains of cough and shortness of breath with heavy exertion.   Dr. Dorris Fetch described the proposed operative procedure to her.  The plan would be to do a robotic assisted right lower lobe segmentectomy, but she understands there is a possibility we might have to do a lobectomy depending on intraoperative findings.  Potential risks, benefits, and complications of the surgery were discussed with the patient and she agreed to proceed. Given the small size of the nodule and its groundglass nature we need to have that marked prior to surgery to ensure we can get an adequate margin.  It was arranged for Dr. Thora Lance to do a dye marking prior to surgical intervention.  Hospital Course: Patient underwent an Xi robotic assisted right thoracoscopy, RLL segmentectomy, LN dissection, intercostal nerve block. She was extubated and transported from the OR to PACU in stable condition. Chest tube was placed to water seal. Daily chest x rays were obtained and remained stable. The patient did well post operatively.  Her chest tube was free from air leak.  Output was initially too high to  remove.  This improved and her chest tube was removed on 07/04/2022.  Her CXR showed trace right apical pneumothorax. She was provided education on importance of deep breathing and IS use.  She remained in NSR.  She has a history of Hypertension, however her blood pressure was too low to resume home Norvasc.  She has been weaned off oxygen.  PT was ordered as she was not ambulating much. Ambulation has improved. Per walk test, we will arrange for home oxygen. .Surgical incisions are healing without evidence of infection.  She is medically stable for discharge home today.

## 2022-07-02 NOTE — Anesthesia Procedure Notes (Signed)
Procedure Name: Intubation Date/Time: 07/02/2022 7:41 AM  Performed by: Lorie Phenix, CRNAPre-anesthesia Checklist: Patient identified, Emergency Drugs available, Suction available and Patient being monitored Patient Re-evaluated:Patient Re-evaluated prior to induction Oxygen Delivery Method: Circle system utilized Preoxygenation: Pre-oxygenation with 100% oxygen Induction Type: IV induction Ventilation: Mask ventilation without difficulty Laryngoscope Size: Mac and 3 Grade View: Grade I Tube type: Oral Tube size: 8.5 mm Number of attempts: 1 Airway Equipment and Method: Stylet Placement Confirmation: ETT inserted through vocal cords under direct vision, positive ETCO2 and breath sounds checked- equal and bilateral Secured at: 22 cm Tube secured with: Tape Dental Injury: Teeth and Oropharynx as per pre-operative assessment

## 2022-07-03 ENCOUNTER — Inpatient Hospital Stay (HOSPITAL_COMMUNITY): Payer: Commercial Managed Care - HMO

## 2022-07-03 LAB — CBC
HCT: 34.9 % — ABNORMAL LOW (ref 36.0–46.0)
Hemoglobin: 11.7 g/dL — ABNORMAL LOW (ref 12.0–15.0)
MCH: 30.8 pg (ref 26.0–34.0)
MCHC: 33.5 g/dL (ref 30.0–36.0)
MCV: 91.8 fL (ref 80.0–100.0)
Platelets: 213 10*3/uL (ref 150–400)
RBC: 3.8 MIL/uL — ABNORMAL LOW (ref 3.87–5.11)
RDW: 13.1 % (ref 11.5–15.5)
WBC: 11.7 10*3/uL — ABNORMAL HIGH (ref 4.0–10.5)
nRBC: 0 % (ref 0.0–0.2)

## 2022-07-03 LAB — BASIC METABOLIC PANEL
Anion gap: 8 (ref 5–15)
BUN: 11 mg/dL (ref 8–23)
CO2: 24 mmol/L (ref 22–32)
Calcium: 8.5 mg/dL — ABNORMAL LOW (ref 8.9–10.3)
Chloride: 104 mmol/L (ref 98–111)
Creatinine, Ser: 0.52 mg/dL (ref 0.44–1.00)
GFR, Estimated: >60 mL/min (ref >60)
Glucose, Bld: 123 mg/dL — ABNORMAL HIGH (ref 70–99)
Potassium: 4.3 mmol/L (ref 3.5–5.1)
Sodium: 136 mmol/L (ref 135–145)

## 2022-07-03 MED ORDER — TAMSULOSIN HCL 0.4 MG PO CAPS
0.4000 mg | ORAL_CAPSULE | Freq: Every day | ORAL | Status: DC
  Administered 2022-07-03 – 2022-07-06 (×4): 0.4 mg via ORAL
  Filled 2022-07-03 (×4): qty 1

## 2022-07-03 NOTE — Progress Notes (Addendum)
[address]       [place] [address]             [phone]      1 Day Post-Op Procedure(s) (LRB): XI ROBOTIC ASSISTED THORACOSCOPY-RIGHT LOWER LOBE SEGMENTECTOMY (Right)  Subjective:  Patient with some pain.  States she hasn't gone to the bathroom since 6-7 oclock last night and she doesn't have urge to go.  Denies N/V  Objective: Vital signs in last 24 hours: Temp:  [98 F (36.7 C)-99.1 F (37.3 C)] 98.6 F (37 C) (01/20 0800) Pulse Rate:  [64-88] 64 (01/20 0800) Cardiac Rhythm: Normal sinus rhythm (01/20 0800) Resp:  [13-20] 16 (01/20 0800) BP: (90-123)/(50-83) 90/50 (01/20 0800) SpO2:  [93 %-97 %] 96 % (01/20 0829) Arterial Line BP: (112-159)/(50-96) 159/96 (01/19 1515)  Intake/Output from previous day: 01/19 0701 - 01/20 0700 In: 2722.2 [I.V.:2455.3; IV Piggyback:266.9] Out: 1375 [Urine:1025; Blood:110; Chest Tube:240] Intake/Output this shift: Total I/O In: 400 [P.O.:400] Out: 40 [Chest Tube:40]  General appearance: alert, cooperative, and no distress Heart: regular rate and rhythm Lungs: clear to auscultation bilaterally Abdomen: soft, non-tender; bowel sounds normal; no masses,  no organomegaly Extremities: extremities normal, atraumatic, no cyanosis or edema Wound: clean and dry  Lab Results: Recent Labs    07/03/22 0026  WBC 11.7*  HGB 11.7*  HCT 34.9*  PLT 213   BMET:  Recent Labs    07/03/22 0026  NA 136  K 4.3  CL 104  CO2 24  GLUCOSE 123*  BUN 11  CREATININE 0.52  CALCIUM 8.5*    PT/INR: No results for input(s): "LABPROT", "INR" in the last 72 hours. ABG    Component Value Date/Time   PHART 7.42 06/29/2022 1330   HCO3 24.0 06/29/2022 1330   ACIDBASEDEF 0.2 06/29/2022 1330   O2SAT 99.6 06/29/2022 1330   CBG (last 3)  No results for input(s): "GLUCAP" in the last 72 hours.  Assessment/Plan: S/P Procedure(s) (LRB): XI ROBOTIC ASSISTED THORACOSCOPY-RIGHT LOWER LOBE SEGMENTECTOMY (Right)  CV- NSR, H/O  HTN.. however hypotensive this morning- will hold home Norvasc, can restart as BP allows Pulm- CT on water seal, 300 cc since surgery, no air leak.. leave in place today.Marland Kitchen CXR with tiny space Stop IV fluids GU- ?urinary retention? Will get bladder scan, start flomax.. can I/O cath vs. Reinsert foley if not voiding Lovenox for DVT prophylaxis Dispo- patient stable, continue current care   LOS: 1 day    [name], PA-C 07/03/2022   Chart reviewed, patient examined, agree with above. Complains of chest wall pain on right from surgery. CXR ok. CT still draining serosanguinous fluid and still too much to remove tube. Reevaluate in am.

## 2022-07-03 NOTE — Plan of Care (Signed)
  Problem: Education: Goal: Knowledge of disease or condition will improve Outcome: Progressing Goal: Knowledge of the prescribed therapeutic regimen will improve Outcome: Progressing   Problem: Activity: Goal: Risk for activity intolerance will decrease Outcome: Progressing   Problem: Cardiac: Goal: Will achieve and/or maintain hemodynamic stability Outcome: Progressing   Problem: Clinical Measurements: Goal: Postoperative complications will be avoided or minimized Outcome: Progressing   Problem: Respiratory: Goal: Respiratory status will improve Outcome: Progressing   Problem: Pain Management: Goal: Pain level will decrease Outcome: Progressing   Problem: Skin Integrity: Goal: Wound healing without signs and symptoms infection will improve Outcome: Progressing   Problem: Education: Goal: Knowledge of General Education information will improve Description: Including pain rating scale, medication(s)/side effects and non-pharmacologic comfort measures Outcome: Progressing   Problem: Health Behavior/Discharge Planning: Goal: Ability to manage health-related needs will improve Outcome: Progressing   Problem: Clinical Measurements: Goal: Ability to maintain clinical measurements within normal limits will improve Outcome: Progressing Goal: Will remain free from infection Outcome: Progressing Goal: Diagnostic test results will improve Outcome: Progressing Goal: Respiratory complications will improve Outcome: Progressing Goal: Cardiovascular complication will be avoided Outcome: Progressing   Problem: Activity: Goal: Risk for activity intolerance will decrease Outcome: Progressing   Problem: Nutrition: Goal: Adequate nutrition will be maintained Outcome: Progressing   Problem: Coping: Goal: Level of anxiety will decrease Outcome: Progressing   Problem: Elimination: Goal: Will not experience complications related to bowel motility Outcome: Progressing Goal:  Will not experience complications related to urinary retention Outcome: Progressing

## 2022-07-04 ENCOUNTER — Encounter (HOSPITAL_COMMUNITY): Payer: Self-pay | Admitting: Student

## 2022-07-04 ENCOUNTER — Inpatient Hospital Stay (HOSPITAL_COMMUNITY): Payer: Commercial Managed Care - HMO

## 2022-07-04 LAB — CBC
HCT: 30.8 % — ABNORMAL LOW (ref 36.0–46.0)
Hemoglobin: 10.1 g/dL — ABNORMAL LOW (ref 12.0–15.0)
MCH: 31.1 pg (ref 26.0–34.0)
MCHC: 32.8 g/dL (ref 30.0–36.0)
MCV: 94.8 fL (ref 80.0–100.0)
Platelets: 142 10*3/uL — ABNORMAL LOW (ref 150–400)
RBC: 3.25 MIL/uL — ABNORMAL LOW (ref 3.87–5.11)
RDW: 13.2 % (ref 11.5–15.5)
WBC: 6 10*3/uL (ref 4.0–10.5)
nRBC: 0 % (ref 0.0–0.2)

## 2022-07-04 LAB — COMPREHENSIVE METABOLIC PANEL
ALT: 166 U/L — ABNORMAL HIGH (ref 0–44)
AST: 103 U/L — ABNORMAL HIGH (ref 15–41)
Albumin: 2.8 g/dL — ABNORMAL LOW (ref 3.5–5.0)
Alkaline Phosphatase: 87 U/L (ref 38–126)
Anion gap: 5 (ref 5–15)
BUN: 12 mg/dL (ref 8–23)
CO2: 26 mmol/L (ref 22–32)
Calcium: 8.4 mg/dL — ABNORMAL LOW (ref 8.9–10.3)
Chloride: 104 mmol/L (ref 98–111)
Creatinine, Ser: 0.6 mg/dL (ref 0.44–1.00)
GFR, Estimated: >60 mL/min (ref >60)
Glucose, Bld: 100 mg/dL — ABNORMAL HIGH (ref 70–99)
Potassium: 4.4 mmol/L (ref 3.5–5.1)
Sodium: 135 mmol/L (ref 135–145)
Total Bilirubin: 0.7 mg/dL (ref 0.3–1.2)
Total Protein: 4.9 g/dL — ABNORMAL LOW (ref 6.5–8.1)

## 2022-07-04 MED ORDER — GUAIFENESIN ER 600 MG PO TB12
600.0000 mg | ORAL_TABLET | Freq: Two times a day (BID) | ORAL | Status: DC
  Administered 2022-07-04 – 2022-07-06 (×5): 600 mg via ORAL
  Filled 2022-07-04 (×5): qty 1

## 2022-07-04 NOTE — Progress Notes (Addendum)
[address]       [place] [address]             [phone]      2 Days Post-Op Procedure(s) (LRB): XI ROBOTIC ASSISTED THORACOSCOPY-RIGHT LOWER LOBE SEGMENTECTOMY (Right)  Subjective:  Patient with pain.  She did not ambulate yesterday or get out of bed.  She was counseled on importance of IS use and good deep breathing.  She was instructed to ambulate today and get up in the chair  Objective: Vital signs in last 24 hours: Temp:  [97.8 F (36.6 C)-98.7 F (37.1 C)] 98.3 F (36.8 C) (01/21 0700) Pulse Rate:  [68-80] 68 (01/21 0700) Cardiac Rhythm: Normal sinus rhythm (01/21 0701) Resp:  [13-20] 16 (01/21 0700) BP: (96-124)/(54-67) 96/54 (01/21 0700) SpO2:  [92 %-97 %] 95 % (01/21 0841)  Intake/Output from previous day: 01/20 0701 - 01/21 0700 In: 400 [P.O.:400] Out: 720 [Urine:600; Chest Tube:120] Intake/Output this shift: Total I/O In: -  Out: 20 [Chest Tube:20]  General appearance: alert, cooperative, and no distress Heart: regular rate and rhythm Lungs: clear to auscultation bilaterally Abdomen: soft, non-tender; bowel sounds normal; no masses,  no organomegaly Extremities: extremities normal, atraumatic, no cyanosis or edema Wound: clean and dry  Lab Results: Recent Labs    07/03/22 0026 07/04/22 0032  WBC 11.7* 6.0  HGB 11.7* 10.1*  HCT 34.9* 30.8*  PLT 213 142*   BMET:  Recent Labs    07/03/22 0026 07/04/22 0032  NA 136 135  K 4.3 4.4  CL 104 104  CO2 24 26  GLUCOSE 123* 100*  BUN 11 12  CREATININE 0.52 0.60  CALCIUM 8.5* 8.4*    PT/INR: No results for input(s): "LABPROT", "INR" in the last 72 hours. ABG    Component Value Date/Time   PHART 7.42 06/29/2022 1330   HCO3 24.0 06/29/2022 1330   ACIDBASEDEF 0.2 06/29/2022 1330   O2SAT 99.6 06/29/2022 1330   CBG (last 3)  No results for input(s): "GLUCAP" in the last 72 hours.  Assessment/Plan: S/P Procedure(s) (LRB): XI ROBOTIC ASSISTED THORACOSCOPY-RIGHT LOWER  LOBE SEGMENTECTOMY (Right)  CV- NSR, H/O HTN- BP remains low.. continue to hold Norvasc Pulm- CT on water seal, no air leak, output was low, CXR with increase atelectasis on right.. will d/c chest tube today.. patient educated on good pulmonary GU- urinary retention resolved, patient voiding w/o difficulty Lovenox for DVT prophylaxis Dispo- patient stable, will d/c chest tube today, urinary retention resolved, educated patient on importance of deep breathing, pulmonary toilet,  if she remains clinically stable for possible d/c in next 24-48 hours   LOS: 2 days    [name], PA-C 07/04/2022   Chart reviewed, patient examined, agree with above. CXR shows mild atelectasis on right. No air leak. Still waiting to get tube out. Says she has not been up out of bed since surgery. Only doing 300 on IS.

## 2022-07-04 NOTE — Plan of Care (Signed)

## 2022-07-04 NOTE — Progress Notes (Signed)
Mobility Specialist Progress Note:   07/04/22 1229  Mobility  Activity Ambulated with assistance in hallway  Level of Assistance Contact guard assist, steadying assist  Assistive Device Front wheel walker  Distance Ambulated (ft) 120 ft  Activity Response Tolerated well  $Mobility charge 1 Mobility   During Mobility:90% SpO2 (1L) Post Mobility: 92% SpO2 (1L)  Pt in bed willing to participate in mobility. Complaints of pain from the chest tube. Left in bed with call bell in reach and all needs met.   [place] Rhylynn Perdomo Mobility Specialist Please contact via [employer] or  Rehab Office at [phone]

## 2022-07-05 ENCOUNTER — Inpatient Hospital Stay (HOSPITAL_COMMUNITY): Payer: Commercial Managed Care - HMO

## 2022-07-05 ENCOUNTER — Encounter (HOSPITAL_COMMUNITY): Payer: Self-pay | Admitting: Thoracic Surgery (Cardiothoracic Vascular Surgery)

## 2022-07-05 NOTE — Progress Notes (Signed)
   Durable Medical Equipment (From admission, onward)        Start     Ordered  07/05/22 0731  For home use only DME Hospital bed  Once      Question Answer Comment Length of Need 6 Months  The above medical condition requires: Patient requires the ability to reposition frequently  Head must be elevated greater than: 45 degrees  Bed type Semi-electric    07/05/22 0731

## 2022-07-05 NOTE — Evaluation (Signed)
Physical Therapy Evaluation Patient Details Name: Emily Santiago MRN: [id number] DOB: 01/30/1961 Today's Date: 07/05/2022  History of Present Illness  62 y.o. female presents to [facility] hospital on 06/30/2022 for robotic assisted RLL segmentectomy and lymph node dissection. PMH includes anxiety, OA, asthma, CKD II, COPD, COVID, emphysema, HLD, HTN, lymphoma.  Clinical Impression  Pt presents to PT with deficits in endurance, gait, balance, power, functional mobility. Pt is able to ambulate with UE support, experiencing one mild loss of balance during session. Pt will benefit from frequent ambulation as well as gait assessment with use of rollator prior to discharge. PT anticipates no PT follow-up if mobility continues to progress appropriately.       Recommendations for follow up therapy are one component of a multi-disciplinary discharge planning process, led by the attending physician.  Recommendations may be updated based on patient status, additional functional criteria and insurance authorization.  Follow Up Recommendations No PT follow up      Assistance Recommended at Discharge Intermittent Supervision/Assistance  Patient can return home with the following  A little help with bathing/dressing/bathroom;Assistance with cooking/housework;Assist for transportation    Equipment Recommendations Rollator (4 wheels)  Recommendations for Other Services       Functional Status Assessment Patient has had a recent decline in their functional status and demonstrates the ability to make significant improvements in function in a reasonable and predictable amount of time.     Precautions / Restrictions Precautions Precautions: Fall;Other (comment) Precaution Comments: monitor sats Restrictions Weight Bearing Restrictions: No      Mobility  Bed Mobility               General bed mobility comments: pt received standing with mobility specialist    Transfers Overall transfer level: Needs  assistance Equipment used: Rolling walker (2 wheels) Transfers: Sit to/from Stand Sit to Stand: Supervision           General transfer comment: stand to sit with supervision    Ambulation/Gait Ambulation/Gait assistance: Min guard Gait Distance (Feet): 150 Feet Assistive device: Rolling walker (2 wheels) Gait Pattern/deviations: Step-through pattern Gait velocity: reduced Gait velocity interpretation: <1.8 ft/sec, indicate of risk for recurrent falls   General Gait Details: slowed step-through gait, one mild posterior loss of balance which pt uses stepping strategy and UE support of RW to correct  Stairs            Wheelchair Mobility    Modified Rankin (Stroke Patients Only)       Balance Overall balance assessment: Needs assistance Sitting-balance support: No upper extremity supported, Feet supported Sitting balance-Leahy Scale: Good     Standing balance support: Bilateral upper extremity supported, Reliant on assistive device for balance Standing balance-Leahy Scale: Poor                               Pertinent Vitals/Pain Pain Assessment Pain Assessment: Faces Faces Pain Scale: Hurts little more Pain Location: surgical site Pain Descriptors / Indicators: Sore Pain Intervention(s): Monitored during session    Home Living Family/patient expects to be discharged to:: Private residence Living Arrangements: Spouse/significant other Available Help at Discharge: Family;Available PRN/intermittently Type of Home: House Home Access: Ramped entrance       Home Layout: One level Home Equipment: None      Prior Function Prior Level of Function : Independent/Modified Independent;Working/employed;Driving             Mobility Comments: works as a  private duty CNA       Hand Dominance        Extremity/Trunk Assessment   Upper Extremity Assessment Upper Extremity Assessment: Overall WFL for tasks assessed    Lower Extremity  Assessment Lower Extremity Assessment: Generalized weakness    Cervical / Trunk Assessment Cervical / Trunk Assessment: Normal  Communication   Communication: No difficulties  Cognition Arousal/Alertness: Awake/alert Behavior During Therapy: WFL for tasks assessed/performed Overall Cognitive Status: Within Functional Limits for tasks assessed                                          General Comments General comments (skin integrity, edema, etc.): poor pleth reading when mobilizing, however sats appear to be 88-93% when wavelength is reliable when mobilizing on room air.    Exercises     Assessment/Plan    PT Assessment Patient needs continued PT services  PT Problem List Decreased strength;Decreased activity tolerance;Decreased balance;Decreased mobility;Cardiopulmonary status limiting activity       PT Treatment Interventions DME instruction;Gait training;Functional mobility training;Therapeutic activities;Therapeutic exercise;Balance training;Patient/family education    PT Goals (Current goals can be found in the Care Plan section)  Acute Rehab PT Goals Patient Stated Goal: to return to independence PT Goal Formulation: With patient Time For Goal Achievement: 07/19/22 Potential to Achieve Goals: Good Additional Goals Additional Goal #1: Pt will report 1/4 DOE or less when ambulating for >400' on room air to demonstrate improved endurance.    Frequency Min 3X/week     Co-evaluation               AM-PAC PT "6 Clicks" Mobility  Outcome Measure Help needed turning from your back to your side while in a flat bed without using bedrails?: A Little Help needed moving from lying on your back to sitting on the side of a flat bed without using bedrails?: A Little Help needed moving to and from a bed to a chair (including a wheelchair)?: A Little Help needed standing up from a chair using your arms (e.g., wheelchair or bedside chair)?: A Little Help  needed to walk in hospital room?: A Little Help needed climbing 3-5 steps with a railing? : A Little 6 Click Score: 18    End of Session   Activity Tolerance: Patient tolerated treatment well Patient left: in chair;with call bell/phone within reach;with family/visitor present Nurse Communication: Mobility status PT Visit Diagnosis: Other abnormalities of gait and mobility (R26.89)    Time: 1642-9037 PT Time Calculation (min) (ACUTE ONLY): 12 min   Charges:   PT Evaluation $PT Eval Low Complexity: 1 Low          Arlyss Gandy, PT, DPT Acute Rehabilitation Office [phone]   Arlyss Gandy 07/05/2022, 2:54 PM

## 2022-07-05 NOTE — TOC Initial Note (Signed)
Transition of Care [facility]) - Initial/Assessment Note    Patient Details  Name: Emily Santiago MRN: [id number] Date of Birth: [birth year]  [facility]) CM/SW Contact:    Harriet Masson, RN Phone Number: 07/05/2022, 11:52 AM  Clinical Narrative:                  Spoke to patient at bedside regarding transition needs.  Patient stated she lives with her boyfriend. Order for hospital bed. Patient is agreeable to use in house provider [employer].  Kristin with [employer] notified. Address, Phone number and PCP verified. Preferred pharmacy  [facility] in [place]. TOC will continue to follow for needs.   Barriers to Discharge: Continued Medical Work up   Patient Goals and CMS Choice Patient states their goals for this hospitalization and ongoing recovery are:: return home CMS Medicare.gov Compare Post Acute Care list provided to:: Patient Choice offered to / list presented to : Patient      Expected Discharge Plan and Services   Discharge Planning Services: CM Consult Post Acute Care Choice: Durable Medical Equipment Living arrangements for the past 2 months: Single Family Home                 DME Arranged: Hospital bed DME Agency: [employer] Date DME Agency Contacted: 07/05/22 Time DME Agency Contacted: 1141 Representative spoke with at DME Agency: Belenda Cruise            Prior Living Arrangements/Services Living arrangements for the past 2 months: Single Family Home Lives with:: Domestic Partner Patient language and need for interpreter reviewed:: Yes Do you feel safe going back to the place where you live?: Yes      Need for Family Participation in Patient Care: Yes (Comment) Care giver support system in place?: Yes (comment)   Criminal Activity/Legal Involvement Pertinent to Current Situation/Hospitalization: No - Comment as needed  Activities of Daily Living      Permission Sought/Granted Permission sought to share information with : Case Manager                 Emotional Assessment Appearance:: Appears stated age Attitude/Demeanor/Rapport: Gracious Affect (typically observed): Accepting Orientation: : Oriented to Place, Oriented to Situation, Oriented to  Time, Oriented to Self Alcohol / Substance Use: Not Applicable Psych Involvement: No (comment)  Admission diagnosis:  Lung nodule [R91.1] Right lower lobe pulmonary nodule [R91.1] Patient Active Problem List   Diagnosis Date Noted   Right lower lobe pulmonary nodule 07/02/2022   Lung nodule 05/03/2022   Chronic gastric ulcer without hemorrhage and without perforation    Melena    Personal history of colonic polyps    Boil of neck 10/22/2021   Multiple complaints 12/31/2019   Weight loss 03/23/2018   Family discord 04/22/2017   Insomnia 04/22/2017   Epigastric pain 02/19/2017   Chest pain 02/18/2017   Hypotension due to drugs 02/18/2017   Hypokalemia 02/18/2017   Chronic fatigue 01/21/2017   DJD (degenerative joint disease) of cervical spine 10/15/2016   Dysuria 07/05/2016   Other constipation 06/22/2016   Mucositis due to antineoplastic therapy 06/08/2016   Hypotension 06/03/2016   Sore throat 05/04/2016   Dysphagia 04/22/2016   Gastroesophageal reflux disease 04/22/2016   Follicular lymphoma grade iiia, lymph nodes of head, face, and neck (HCC) 04/21/2016   Essential hypertension 04/21/2016   Tobacco abuse 04/20/2016   Pain of left lower extremity-Thigh / Leg 02/05/2014   Varicose veins of lower extremities with other complications 02/26/2013   PCP:  Mitchell, L.August Saucer, MD Pharmacy:   [phone] [place], [place] - [address] [facility]  [address] Phone: [phone] Fax: [phone]     Social Determinants of Health (SDOH) Social History: SDOH Screenings   Tobacco Use: Medium Risk (07/04/2022)   SDOH Interventions:     Readmission Risk Interventions     No data to display

## 2022-07-05 NOTE — Progress Notes (Signed)
  Transition of Care [facility]) Screening Note   Patient Details  Name: Emily Santiago Date of Birth: March 11, 1961   Transition of Care [facility]) CM/SW Contact:    Harriet Masson, RN Phone Number: 07/05/2022, 9:04 AM    Transition of Care Department [facility]) has reviewed patient and no TOC needs have been identified at this time. We will continue to monitor patient advancement through interdisciplinary progression rounds. If new patient transition needs arise, please place a TOC consult.

## 2022-07-05 NOTE — Anesthesia Postprocedure Evaluation (Signed)
Anesthesia Post Note  Patient: Emily Santiago  Procedure(s) Performed: ROBOTIC ASSISTED NAVIGATIONAL BRONCHOSCOPY FIDUCIAL DYE MARKING XI ROBOTIC ASSISTED THORACOSCOPY-RIGHT LOWER LOBE SEGMENTECTOMY (Right: Chest)     Patient location during evaluation: PACU Anesthesia Type: General Level of consciousness: awake and alert Pain management: pain level controlled Vital Signs Assessment: post-procedure vital signs reviewed and stable Respiratory status: spontaneous breathing, nonlabored ventilation, respiratory function stable and patient connected to nasal cannula oxygen Cardiovascular status: blood pressure returned to baseline and stable Postop Assessment: no apparent nausea or vomiting Anesthetic complications: no   No notable events documented.  Last Vitals:  Vitals:   07/05/22 0753 07/05/22 0800  BP: 127/72   Pulse: 88   Resp: 20   Temp: 37 C   SpO2: 94% 93%    Last Pain:  Vitals:   07/05/22 0753  TempSrc: Oral  PainSc:                  Lulla Linville

## 2022-07-05 NOTE — Progress Notes (Signed)
Mobility Specialist Progress Note    07/05/22 1429  Mobility  Activity Ambulated with assistance in hallway  Level of Assistance Contact guard assist, steadying assist  Assistive Device Front wheel walker  Distance Ambulated (ft) 50 ft  Activity Response Tolerated well  Mobility Referral Yes  $Mobility charge 1 Mobility   Pt received in chair and agreeable. No complaints. Pt requesting more miralax, RN notified. PT joined patient in hall for session.   [place] Mobility Specialist  Please Neurosurgeon or Rehab Office at [phone]

## 2022-07-05 NOTE — Progress Notes (Addendum)
[address]       [place] [address]             [phone]       3 Days Post-Op Procedure(s) (LRB): XI ROBOTIC ASSISTED THORACOSCOPY-RIGHT LOWER LOBE SEGMENTECTOMY (Right)  Subjective: Patient walked two times yesterday. She thinks she needs a hospital bed at discharge.  Objective: Vital signs in last 24 hours: Temp:  [97.7 F (36.5 C)-98.8 F (37.1 C)] 98.6 F (37 C) (01/22 0317) Pulse Rate:  [75-94] 90 (01/22 0317) Cardiac Rhythm: Normal sinus rhythm (01/21 1939) Resp:  [17-20] 17 (01/22 0317) BP: (115-147)/(67-84) 124/73 (01/22 0317) SpO2:  [89 %-95 %] 94 % (01/22 0317)      Intake/Output from previous day: 01/21 0701 - 01/22 0700 In: 680 [P.O.:680] Out: 920 [Urine:900; Chest Tube:20]   Physical Exam:  Cardiovascular: RRR Pulmonary: Clear to auscultation bilaterally Abdomen: Soft, non tender, bowel sounds present. Extremities: Scds in place Wounds: Clean and dry.  No erythema or signs of infection.   Lab Results: CBC: Recent Labs    07/03/22 0026 07/04/22 0032  WBC 11.7* 6.0  HGB 11.7* 10.1*  HCT 34.9* 30.8*  PLT 213 142*   BMET:  Recent Labs    07/03/22 0026 07/04/22 0032  NA 136 135  K 4.3 4.4  CL 104 104  CO2 24 26  GLUCOSE 123* 100*  BUN 11 12  CREATININE 0.52 0.60  CALCIUM 8.5* 8.4*    PT/INR: No results for input(s): "LABPROT", "INR" in the last 72 hours. ABG:  INR: Will add last result for INR, ABG once components are confirmed Will add last 4 CBG results once components are confirmed  Assessment/Plan:  1. CV - SR 2.  Pulmonary - On room air. PA/LAT CXR this am appears stable (mild right lateral chest wall emphysema). Encourage incentive spirometer. Await final pathology. 3. Expected post op blood loss anemia-Last H and H 10.1 and 30.8 4. On Lovenox for DVT prophylaxis 5. PT consult to assist with mobility 6. Discharge 1-2 days once mobility improved  Donielle M ZimmermanPA-C 07/05/2022,7:02  AM  Patient seen and examined, agree with above Needs to mobilize Path pending CXR OK with tube out  Viviann Spare C. Dorris Fetch, MD [facility] [phone]

## 2022-07-05 NOTE — Progress Notes (Signed)
Mobility Specialist Progress Note    07/05/22 0943  Mobility  Activity Ambulated with assistance in hallway  Level of Assistance Contact guard assist, steadying assist  Assistive Device Front wheel walker  Distance Ambulated (ft) 180 ft  Activity Response Tolerated well  Mobility Referral Yes  $Mobility charge 1 Mobility   Pre-Mobility: 93 HR, 134/80 (93) BP, 89% SpO2 Post-Mobility: 89 HR, 90% SpO2  Pt received in bed and agreeable. Had BM in BR. C/o it hurting to breath. Encouraged pursed lip breathing. SpO2 86-90% on RA. Returned to chair with call bell in reach. RN notified.   [place] Mobility Specialist  Please Neurosurgeon or Rehab Office at [phone]

## 2022-07-06 LAB — SURGICAL PATHOLOGY

## 2022-07-06 MED ORDER — OXYCODONE HCL 5 MG PO TABS: 5.0000 mg | ORAL_TABLET | Freq: Four times a day (QID) | ORAL | 0 refills | Status: DC | PRN

## 2022-07-06 MED ORDER — GABAPENTIN 300 MG PO CAPS: 300.0000 mg | ORAL_CAPSULE | Freq: Every day | ORAL | 0 refills | Status: DC

## 2022-07-06 NOTE — Progress Notes (Addendum)
Nurse requested Mobility Specialist to perform oxygen saturation test with pt which includes removing pt from oxygen both at rest and while ambulating.  Below are the results from that testing.     Patient Saturations on Room Air at Rest = spO2 89%  Patient Saturations on Room Air while Ambulating = sp02 86% .  Rested and performed pursed lip breathing for 1 minute with sp02 at 88%.  Patient Saturations on 2 Liters of oxygen while Ambulating = sp02 94%  At end of testing pt left in room on 2  Liters of oxygen.  Reported results to nurse.

## 2022-07-06 NOTE — Progress Notes (Addendum)
[address]       [place] [address]             [phone]       4 Days Post-Op Procedure(s) (LRB): XI ROBOTIC ASSISTED THORACOSCOPY-RIGHT LOWER LOBE SEGMENTECTOMY (Right)  Subjective: Patient ambulated several times yesterday. She is eating breakfast this am. She has no specific complaint  Objective: Vital signs in last 24 hours: Temp:  [98 F (36.7 C)-98.6 F (37 C)] 98.4 F (36.9 C) (01/23 0345) Pulse Rate:  [82-104] 82 (01/23 0345) Cardiac Rhythm: Sinus tachycardia (01/22 1900) Resp:  [14-25] 14 (01/23 0345) BP: (119-132)/(70-82) 119/75 (01/23 0345) SpO2:  [90 %-96 %] 95 % (01/23 0345)      Intake/Output from previous day: 01/22 0701 - 01/23 0700 In: 1080 [P.O.:1080] Out: -    Physical Exam:  Cardiovascular: RRR Pulmonary: Clear to auscultation bilaterally Abdomen: Soft, non tender, bowel sounds present. Extremities: No LE edema Wounds: Clean and dry.  No erythema or signs of infection. Chest tube wound has no active drainage this am   Lab Results: CBC: Recent Labs    07/04/22 0032  WBC 6.0  HGB 10.1*  HCT 30.8*  PLT 142*    BMET:  Recent Labs    07/04/22 0032  NA 135  K 4.4  CL 104  CO2 26  GLUCOSE 100*  BUN 12  CREATININE 0.60  CALCIUM 8.4*     PT/INR: No results for input(s): "LABPROT", "INR" in the last 72 hours. ABG:  INR: Will add last result for INR, ABG once components are confirmed Will add last 4 CBG results once components are confirmed  Assessment/Plan:  1. CV - SR 2.  Pulmonary - On room air.  Encourage incentive spirometer. Await final pathology. 3. Expected post op blood loss anemia-Last H and H 10.1 and 30.8 4. On Lovenox for DVT prophylaxis 5. PT to assist with mobility 6. Discharge later today  Lelon Huh [facility] 07/06/2022,6:59 AM  Patient seen and examined, agree with above Path still pending Will see how she does with ambulation this morning, potentially home later  today  Viviann Spare C. Dorris Fetch, MD [facility] [phone]

## 2022-07-06 NOTE — Progress Notes (Addendum)
Mobility Specialist Progress Note    07/06/22 1100  Mobility  Activity Ambulated with assistance in hallway  Level of Assistance Standby assist, set-up cues, supervision of patient - no hands on  Assistive Device Four wheel walker  Distance Ambulated (ft) 330 ft  Activity Response Tolerated well  Mobility Referral Yes  $Mobility charge 1 Mobility   Pre-Mobility: 83 HR, 89% SpO2 Post-Mobility: 83 HR, 91% SpO2  Pt received in chair requesting to walk again. Encouraged pursed lip breathing. SpO2 mostly in low to mid 90s on RA. SpO2 did drop briefly as low as 86% with a good pleth but recovered to >/=89% with 2LO2. Returned to bed on RA with call bell in reach. RN aware.    [place] Mobility Specialist  Please Neurosurgeon or Rehab Office at [phone]

## 2022-07-06 NOTE — Progress Notes (Signed)
Mobility Specialist Progress Note    07/06/22 0853  Mobility  Activity Ambulated with assistance in hallway  Level of Assistance Standby assist, set-up cues, supervision of patient - no hands on  Assistive Device Four wheel walker  Distance Ambulated (ft) 330 ft  Activity Response Tolerated well  Mobility Referral Yes  $Mobility charge 1 Mobility   Pre-Mobility: 84 HR, 94% SpO2 During Mobility: 102 HR Post-Mobility: 92 HR, 90% SpO2  Pt received in chair and agreeable. C/o feeling weaker than yesterday and like she is in a daze. Encouraged pursed lip breathing. SpO2 88-90% on RA. Returned to chair with call bell in reach.    [place] Mobility Specialist  Please Neurosurgeon or Rehab Office at [phone]

## 2022-07-06 NOTE — TOC Transition Note (Signed)
Transition of Care [facility]) - CM/SW Discharge Note   Patient Details  Name: Emily Santiago MRN: [id number] Date of Birth: 08-Mar-1961  Transition of Care [facility]) CM/SW Contact:  Lawerance Sabal, RN Phone Number: 07/06/2022, 12:28 PM   Clinical Narrative:     DME home oxygen ordered throug [employer], it will be delivered to the room prior to DC. Hospital bed ordered yesterday. No other TOC needs identified for DC    Barriers to Discharge: Continued Medical Work up   Patient Goals and CMS Choice CMS Medicare.gov Compare Post Acute Care list provided to:: Patient Choice offered to / list presented to : Patient  Discharge Placement                         Discharge Plan and Services Additional resources added to the After Visit Summary for     Discharge Planning Services: CM Consult Post Acute Care Choice: Durable Medical Equipment          DME Arranged: Hospital bed DME Agency: [employer] Date DME Agency Contacted: 07/05/22 Time DME Agency Contacted: 1141 Representative spoke with at DME Agency: Belenda Cruise            Social Determinants of Health (SDOH) Interventions SDOH Screenings   Tobacco Use: Medium Risk (07/05/2022)     Readmission Risk Interventions     No data to display

## 2022-07-07 ENCOUNTER — Telehealth: Payer: Self-pay | Admitting: *Deleted

## 2022-07-07 NOTE — Telephone Encounter (Signed)
Contacted Ms. Lok after receiving a telephone call from patient's [employer] RN case manager, Sonia Baller. Per Sonia Baller, patient is c/o 15/10 pain. Patient is s/p robotic segmentectomy 1/19 by Dr. Roxan Hockey. Spoke with patient. Patient states she is experiencing 9/10 shooting pain in her surgical site area. Pateint states pain prohibits her from taking deep breaths. Patient states she is using her incentive spirometer and ambulating. Patient states she is taking oxycodone every 6 hours with a gabapentin. Advised patient that gabapentin is only ordered to be taken nightly. Patient unaware of this. Patient also seeking home health services. Discussed with Josie Saunders, PA. Per PA recommendations, patient advised to incorporated ibuprofen into pain regimen. Advised she is to continue taking oxycodone as prescribed every 6 hrs. Advised patient to take the ibuprofen in between doses of Oxy. Patient advised to resume gabapentin at night only and to call our office in a few days if pain does not improve. Patient advised home health services will not be ordered at this time as there is no indication. Patient verbalized understanding.

## 2022-07-09 LAB — TYPE AND SCREEN
ABO/RH(D): A POS
Antibody Screen: NEGATIVE
Unit division: 0
Unit division: 0

## 2022-07-09 LAB — BPAM RBC
Blood Product Expiration Date: 202402112359
Blood Product Expiration Date: 202402142359
ISSUE DATE / TIME: 202401190342
Unit Type and Rh: 6200
Unit Type and Rh: 6200

## 2022-07-11 ENCOUNTER — Emergency Department (HOSPITAL_COMMUNITY): Payer: Commercial Managed Care - HMO

## 2022-07-11 ENCOUNTER — Encounter (HOSPITAL_COMMUNITY): Payer: Self-pay

## 2022-07-11 ENCOUNTER — Emergency Department (HOSPITAL_COMMUNITY)
Admission: EM | Admit: 2022-07-11 | Discharge: 2022-07-11 | Disposition: A | Discharge status: Home / Self Care | Payer: Commercial Managed Care - HMO | Attending: Emergency Medicine | Admitting: Emergency Medicine

## 2022-07-11 ENCOUNTER — Other Ambulatory Visit: Payer: Self-pay

## 2022-07-11 DIAGNOSIS — R0602 Shortness of breath: Secondary | ICD-10-CM | POA: Diagnosis not present

## 2022-07-11 DIAGNOSIS — R509 Fever, unspecified: Secondary | ICD-10-CM | POA: Diagnosis not present

## 2022-07-11 DIAGNOSIS — R519 Headache, unspecified: Secondary | ICD-10-CM | POA: Insufficient documentation

## 2022-07-11 DIAGNOSIS — G8918 Other acute postprocedural pain: Secondary | ICD-10-CM

## 2022-07-11 DIAGNOSIS — Z20822 Contact with and (suspected) exposure to covid-19: Secondary | ICD-10-CM | POA: Diagnosis not present

## 2022-07-11 DIAGNOSIS — Z91040 Latex allergy status: Secondary | ICD-10-CM | POA: Insufficient documentation

## 2022-07-11 DIAGNOSIS — R059 Cough, unspecified: Secondary | ICD-10-CM | POA: Diagnosis not present

## 2022-07-11 LAB — COMPREHENSIVE METABOLIC PANEL
ALT: 21 U/L (ref 0–44)
AST: 15 U/L (ref 15–41)
Albumin: 3.7 g/dL (ref 3.5–5.0)
Alkaline Phosphatase: 77 U/L (ref 38–126)
Anion gap: 12 (ref 5–15)
BUN: 9 mg/dL (ref 8–23)
CO2: 21 mmol/L — ABNORMAL LOW (ref 22–32)
Calcium: 9.7 mg/dL (ref 8.9–10.3)
Chloride: 102 mmol/L (ref 98–111)
Creatinine, Ser: 0.5 mg/dL (ref 0.44–1.00)
GFR, Estimated: >60 mL/min (ref >60)
Glucose, Bld: 90 mg/dL (ref 70–99)
Potassium: 3.7 mmol/L (ref 3.5–5.1)
Sodium: 135 mmol/L (ref 135–145)
Total Bilirubin: 0.4 mg/dL (ref 0.3–1.2)
Total Protein: 7.1 g/dL (ref 6.5–8.1)

## 2022-07-11 LAB — CBC WITH DIFFERENTIAL/PLATELET
Abs Immature Granulocytes: 0.02 10*3/uL (ref 0.00–0.07)
Basophils Absolute: 0 10*3/uL (ref 0.0–0.1)
Basophils Relative: 0 %
Eosinophils Absolute: 0.1 10*3/uL (ref 0.0–0.5)
Eosinophils Relative: 2 %
HCT: 39.7 % (ref 36.0–46.0)
Hemoglobin: 12.9 g/dL (ref 12.0–15.0)
Immature Granulocytes: 0 %
Lymphocytes Relative: 20 %
Lymphs Abs: 1.5 10*3/uL (ref 0.7–4.0)
MCH: 30.4 pg (ref 26.0–34.0)
MCHC: 32.5 g/dL (ref 30.0–36.0)
MCV: 93.4 fL (ref 80.0–100.0)
Monocytes Absolute: 0.4 10*3/uL (ref 0.1–1.0)
Monocytes Relative: 5 %
Neutro Abs: 5.5 10*3/uL (ref 1.7–7.7)
Neutrophils Relative %: 73 %
Platelets: 357 10*3/uL (ref 150–400)
RBC: 4.25 MIL/uL (ref 3.87–5.11)
RDW: 12.8 % (ref 11.5–15.5)
WBC: 7.5 10*3/uL (ref 4.0–10.5)
nRBC: 0 % (ref 0.0–0.2)

## 2022-07-11 LAB — RESP PANEL BY RT-PCR (RSV, FLU A&B, COVID)  RVPGX2
Influenza A by PCR: NEGATIVE
Influenza B by PCR: NEGATIVE
Resp Syncytial Virus by PCR: NEGATIVE
SARS Coronavirus 2 by RT PCR: NEGATIVE

## 2022-07-11 LAB — LACTIC ACID, PLASMA: Lactic Acid, Venous: 1.1 mmol/L (ref 0.5–1.9)

## 2022-07-11 MED ORDER — LACTATED RINGERS IV BOLUS
1000.0000 mL | Freq: Once | INTRAVENOUS | Status: AC
  Administered 2022-07-11: 1000 mL via INTRAVENOUS

## 2022-07-11 MED ORDER — IOHEXOL 350 MG/ML SOLN
75.0000 mL | Freq: Once | INTRAVENOUS | Status: AC | PRN
  Administered 2022-07-11: 75 mL via INTRAVENOUS

## 2022-07-11 MED ORDER — IBUPROFEN 400 MG PO TABS
600.0000 mg | ORAL_TABLET | Freq: Once | ORAL | Status: AC
  Administered 2022-07-11: 600 mg via ORAL
  Filled 2022-07-11: qty 1

## 2022-07-11 NOTE — ED Provider Triage Note (Signed)
Emergency Medicine Provider Triage Evaluation Note  Emily Santiago , a 62 y.o. female  was evaluated in triage.  Pt complains of pain and fever this am.  Pt just had a lobectomy  Review of Systems  Positive: Fever and pain  Negative:   Physical Exam  BP (!) 178/86 (BP Location: Left Arm)   Pulse 77   Temp 98.3 F (36.8 C)   Resp 18   Ht 5' (1.524 m)   Wt 60.3 kg   SpO2 92%   BMI 25.97 kg/m  Gen:   Awake, pale Resp:  Normal effort  MSK:   Moves extremities without difficulty  Other:    Medical Decision Making  Medically screening exam initiated at 2:01 PM.  Appropriate orders placed.  Emily Santiago was informed that the remainder of the evaluation will be completed by another provider, this initial triage assessment does not replace that evaluation, and the importance of remaining in the ED until their evaluation is complete.     Fransico Meadow, [place] 07/11/22 1402

## 2022-07-11 NOTE — ED Triage Notes (Signed)
Pt arrived POV from home c/o pain to the right side of her rib cage, fever and SHOB. Pt states she had surgery and had a tenth of her right lung removed on 1/19 and had a chest tube placed. Pt's incision looks good. No redness swelling or drainage noted but pt is very tender to touch on that side.

## 2022-07-11 NOTE — ED Notes (Signed)
Pt ambulated independently to the bathroom 

## 2022-07-11 NOTE — Discharge Instructions (Signed)
Call the Cardiothoracic Office first thing in the morning for an appointment to be seen tomorrow. Tell them Dr. Lavonna Monarch wanted you seen first thing in the morning.   If you develop new or worsening fever, pain, trouble breathing, or any other new/concerning symptoms then return to the ER or call 911.

## 2022-07-11 NOTE — ED Notes (Signed)
Patient verbalizes understanding of discharge instructions. Opportunity for questioning and answers were provided. Armband removed by staff, pt discharged from ED. Pt taken to ED waiting room via wheel chair.  

## 2022-07-11 NOTE — ED Provider Notes (Signed)
[facility] Provider Note   CSN: [id number] Arrival date & time: 07/11/22  1150     History  Chief Complaint  Patient presents with   rib cage pain   Shortness of Breath    Emily Santiago is a 62 y.o. female.  HPI 62 year old female presents with fever.  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  She states she has had cough and shortness of breath ever since.  She has had a lot of pain in her right chest.  However over the last couple days she has had subjective fever and chills and today her temperature was 101.  She feels like her right lower chest hurts worse.  She has also not been taking the oxycodone and is instead taking Tylenol due to the oxycodone causing constipation.  Little bit of a headache as well.  No other symptoms to suggest a source of fever.  Home Medications Prior to Admission medications   Medication Sig Start Date End Date Taking? Authorizing Provider  amLODipine (NORVASC) 5 MG tablet Take 5 mg by mouth in the morning. 01/12/22   [provider]  buPROPion (WELLBUTRIN SR) 150 MG 12 hr tablet Take 150 mg by mouth in the morning. 02/12/22   [provider]  Cholecalciferol (DIALYVITE VITAMIN D 5000) 125 MCG (5000 UT) capsule Take 5,000 Units by mouth at bedtime.    [provider]  Cyanocobalamin (VITAMIN B-12 PO) Take 1 tablet by mouth in the morning.    [provider]  diclofenac (VOLTAREN) 50 MG EC tablet Take 50 mg by mouth in the morning. 06/20/22   [provider]  esomeprazole (NEXIUM) 40 MG capsule Take 40 mg by mouth daily before breakfast. 01/14/22   [provider]  fluticasone (FLONASE) 50 MCG/ACT nasal spray Place 1 spray into both nostrils daily as needed for allergies or rhinitis.    [provider]  gabapentin (NEURONTIN) 300 MG capsule Take 1 capsule (300 mg total) by mouth at bedtime. 07/06/22   Nani Skillern, PA-C  LORazepam (ATIVAN) 0.5 MG tablet Take 0.25 mg by mouth 2 (two) times daily as needed for anxiety.    [provider]  Multiple Vitamin (MULTIVITAMIN WITH MINERALS) TABS tablet Take 1 tablet by mouth in the morning.    [provider]  oxyCODONE (OXY IR/ROXICODONE) 5 MG immediate release tablet Take 1 tablet (5 mg total) by mouth every 6 (six) hours as needed for severe pain. 07/06/22   Nani Skillern, PA-C  potassium chloride (KLOR-CON M) 10 MEQ tablet Take 10 mEq by mouth every other day. In the morning    [provider]  rosuvastatin (CRESTOR) 10 MG tablet Take 10 mg by mouth every Monday, Wednesday, and Friday. In the morning 02/05/22   [provider]  salmeterol (SEREVENT DISKUS) 50 MCG/ACT diskus inhaler Inhale 1 puff into the lungs 2 (two) times daily. 04/15/22   Hunsucker, Bonna Gains, MD  telmisartan (MICARDIS) 40 MG tablet Take 40 mg by mouth at bedtime.    [provider]  traZODone (DESYREL) 50 MG tablet Take 50 mg by mouth at bedtime. 04/30/22   [provider]  zolpidem (AMBIEN) 10 MG tablet Take 10 mg by mouth at bedtime as needed for sleep.    [provider]      Allergies    Hydrocodone-acetaminophen, Penicillins, Zestril [lisinopril], and Latex    Review of  Systems   Review of Systems  Constitutional:  Positive for chills and fever.  Respiratory:  Positive for cough and shortness of breath.   Cardiovascular:  Positive for chest pain.  Gastrointestinal:  Negative for abdominal pain.  Neurological:  Positive for headaches.    Physical Exam Updated Vital Signs BP (!) 166/92   Pulse 73   Temp 98.3 F (36.8 C)   Resp 20   Ht 5' (1.524 m)   Wt 60.3 kg   SpO2 94%   BMI 25.97 kg/m  Physical Exam Vitals and nursing note reviewed.  Constitutional:      General: She is not in acute distress.    Appearance: She is well-developed. She is not ill-appearing or diaphoretic.  HENT:     Head:  Normocephalic and atraumatic.  Cardiovascular:     Rate and Rhythm: Normal rate and regular rhythm.     Heart sounds: Normal heart sounds.  Pulmonary:     Effort: Pulmonary effort is normal.     Breath sounds: Normal breath sounds.  Chest:     Chest wall: Tenderness present.    Abdominal:     Palpations: Abdomen is soft.     Tenderness: There is no abdominal tenderness.  Skin:    General: Skin is warm and dry.  Neurological:     Mental Status: She is alert.     ED Results / Procedures / Treatments   Labs (all labs ordered are listed, but only abnormal results are displayed) Labs Reviewed  COMPREHENSIVE METABOLIC PANEL - Abnormal; Notable for the following components:      Result Value   CO2 21 (*)    All other components within normal limits  RESP PANEL BY RT-PCR (RSV, FLU A&B, COVID)  [id number]  CULTURE, BLOOD (ROUTINE X 2)  CULTURE, BLOOD (ROUTINE X 2)  CBC WITH DIFFERENTIAL/PLATELET  LACTIC ACID, PLASMA    EKG EKG Interpretation  Date/Time:  Sunday July 11 2022 13:53:59 EST Ventricular Rate:  72 PR Interval:  164 QRS Duration: 76 QT Interval:  378 QTC Calculation: 413 R Axis:   92 Text Interpretation: Normal sinus rhythm Rightward axis  no acute ST/T changes or significant change from Jun 29 2022 Confirmed by Sherwood Gambler [phone]) on 07/11/2022 3:01:44 PM  Radiology CT Angio Chest PE W and/or Wo Contrast  Result Date: 07/11/2022 CLINICAL DATA:  Chest pain. EXAM: CT ANGIOGRAPHY CHEST WITH CONTRAST TECHNIQUE: Multidetector CT imaging of the chest was performed using the standard protocol during bolus administration of intravenous contrast. Multiplanar CT image reconstructions and MIPs were obtained to evaluate the vascular anatomy. RADIATION DOSE REDUCTION: This exam was performed according to the departmental dose-optimization program which includes automated exposure control, adjustment of the mA and/or kV according to patient size and/or use of iterative  reconstruction technique. CONTRAST:  37mL OMNIPAQUE IOHEXOL 350 MG/ML SOLN COMPARISON:  Radiograph of same day.  CT scan of June 30, 2022. FINDINGS: Cardiovascular: Satisfactory opacification of the pulmonary arteries to the segmental level. No evidence of pulmonary embolism. Normal heart size. No pericardial effusion. Mediastinum/Nodes: Thyroid gland is unremarkable. The esophagus appears normal. No definite adenopathy is noted. Lungs/Pleura: No pneumothorax is noted. Left lung is clear. Small right pleural effusion is noted with subsegmental atelectasis of the right lower lobe. Upper Abdomen: No acute abnormality. Musculoskeletal: Nondisplaced fracture seen involving posterior portion of right ninth rib. Small amount of subcutaneous emphysema is seen laterally in the right chest wall. Review of the MIP images confirms the above findings.  IMPRESSION: No definite evidence of pulmonary embolism. Nondisplaced right ninth rib fracture. Small amount of subcutaneous emphysema seen laterally in right chest wall. Small right pleural effusion is noted with associated atelectasis of right lower lobe. Aortic Atherosclerosis (ICD10-I70.0). Electronically Signed   By: Marijo Conception M.D.   On: 07/11/2022 17:50   DG Chest 2 View  Result Date: 07/11/2022 CLINICAL DATA:  Pain on the right EXAM: CHEST - 2 VIEW COMPARISON:  Chest x-ray July 05, 2022 FINDINGS: Air in the soft tissues of the right chest wall has decreased since July 05, 2022. Increased haziness over the right costophrenic angle. Postsurgical changes in the right base. The lungs are otherwise clear. The cardiomediastinal silhouette is stable. No pneumothorax identified. Anterior wedging of lower thoracic vertebral bodies is stable. No other acute abnormalities. IMPRESSION: 1. Air in the soft tissues of the right chest wall has decreased since July 05, 2022 when the patient's right chest tube was removed. It is possible this decreasing air is just  residual unabsorbed air from the recent chest tube. Recommend clinical correlation. 2. Haziness over the right costophrenic angle may represent a small loculated anterior effusion, new in the interval. A CT scan could further evaluate if clinically warranted. Otherwise, recommend follow-up to resolution. 3. No other acute abnormalities. Specifically, the previous small right apical pneumothorax has resolved. Electronically Signed   By: Dorise Bullion III M.D.   On: 07/11/2022 15:09    Procedures Procedures    Medications Ordered in ED Medications  ibuprofen (ADVIL) tablet 600 mg (600 mg Oral Given 07/11/22 1613)  lactated ringers bolus 1,000 mL (1,000 mLs Intravenous New Bag/Given 07/11/22 1612)  iohexol (OMNIPAQUE) 350 MG/ML injection 75 mL (75 mLs Intravenous Contrast Given 07/11/22 1737)    ED Course/ Medical Decision Making/ A&P                             Medical Decision Making Amount and/or Complexity of Data Reviewed Labs:     Details: WBC normal.  COVID and flu and RSV negative. Radiology: ordered and independent interpretation performed.    Details: CTA without PE, small effusion noted. ECG/medicine tests: independent interpretation performed.    Details: No acute ST/T changes  Risk Prescription drug management.   Patient presents with postoperative pain and 1 elevated temperature.  No fever here.  White blood cell count and other lab work is unremarkable.  Cultures were obtained in triage though she is showing no signs of sepsis at this time.  Workup is unremarkable.  She has no specific symptoms besides the chest wall and postoperative pain.  Thus I doubt other source of infection such as UTI, cellulitis, etc. which are not present based on history and physical.  After CT I discussed with cardiothoracic surgery, Dr. Kipp Brood.  After discussion of workup, feels that antibiotics are not warranted at this time but rather would have her be seen first thing in the morning in the  cardiothoracic office.  Patient is okay with this plan.  She is hypertensive here but no other signs or symptoms and no current chest pain besides the chest wall pain.  I do not think this needs emergent blood pressure management but she should continue to monitor it at home and discuss with her doctor.  Otherwise, will discharge home with return precautions.  She feels better after the ibuprofen given.        Final Clinical Impression(s) / ED Diagnoses Final  diagnoses:  Post-operative pain    Rx / DC Orders ED Discharge Orders     None         Sherwood Gambler, MD 07/11/22 [phone]

## 2022-07-12 ENCOUNTER — Telehealth: Payer: Self-pay | Admitting: *Deleted

## 2022-07-12 ENCOUNTER — Ambulatory Visit (INDEPENDENT_AMBULATORY_CARE_PROVIDER_SITE_OTHER): Payer: Self-pay | Admitting: Physician Assistant

## 2022-07-12 VITALS — BP 145/90 | HR 89 | Temp 98.2°F | Resp 20 | Ht 60.0 in | Wt 131.0 lb

## 2022-07-12 DIAGNOSIS — Z9889 Other specified postprocedural states: Secondary | ICD-10-CM

## 2022-07-12 MED ORDER — GABAPENTIN 300 MG PO CAPS: 300.0000 mg | ORAL_CAPSULE | Freq: Two times a day (BID) | ORAL | 1 refills | Status: DC

## 2022-07-12 MED ORDER — GABAPENTIN 300 MG PO CAPS: 300.0000 mg | ORAL_CAPSULE | Freq: Every day | ORAL | 1 refills | Status: DC

## 2022-07-12 NOTE — Telephone Encounter (Signed)
Patient contacted the office stating she was in the ED yesterday for a fever. Per patient, she was SOB, had a 101 degree temp, and was in pain. Per patient, temp this morning is 98.2. Denies drainage, redness or swelling at incision. States she was told that incisions are healing nicely. States she is using her incentive . spirometer ED physician spoke with Dr. Kipp Brood who recommended a follow up appt today in clinic. Appt scheduled for patient to see PA today. Patient acknowledged receipt.

## 2022-07-12 NOTE — Progress Notes (Addendum)
[address]       [place] [address]             [phone] HPI: This is a 62 year old who is s/p Xi robotic assisted right thoracoscopy, superior segmentectomy RLL, LN dissection, and intercostal nerve block on 07/02/2022 by Dr. Roxan Hockey. She was discharged in stable  condition with home oxygen on 07/02/2022. She presented to the ED yesterday for complaints of fever, pain on the right, and shortness of breath. CXR done showed air in the soft tissues of the right chest wall has decreased  and haziness over the right costophrenic angle may represent a small loculated anterior effusion. CT then showed no definite evidence of pulmonary embolism, Non displaced right ninth rib fracture, small amount of subcutaneous emphysema seen laterally in right chest wall, and small right pleural effusion is noted with associated atelectasis of right lower lobe.   Current Outpatient Medications  Medication Sig Dispense Refill   amLODipine (NORVASC) 5 MG tablet Take 5 mg by mouth in the morning.     buPROPion (WELLBUTRIN SR) 150 MG 12 hr tablet Take 150 mg by mouth in the morning.     Cholecalciferol (DIALYVITE VITAMIN D 5000) 125 MCG (5000 UT) capsule Take 5,000 Units by mouth at bedtime.     Cyanocobalamin (VITAMIN B-12 PO) Take 1 tablet by mouth in the morning.     diclofenac (VOLTAREN) 50 MG EC tablet Take 50 mg by mouth in the morning.     esomeprazole (NEXIUM) 40 MG capsule Take 40 mg by mouth daily before breakfast.     fluticasone (FLONASE) 50 MCG/ACT nasal spray Place 1 spray into both nostrils daily as needed for allergies or rhinitis.     gabapentin (NEURONTIN) 300 MG capsule Take 1 capsule (300 mg total) by mouth at bedtime. 30 capsule 0   LORazepam (ATIVAN) 0.5 MG tablet Take 0.25 mg by mouth 2 (two) times daily as needed for anxiety.     Multiple Vitamin (MULTIVITAMIN WITH MINERALS) TABS tablet Take 1 tablet by mouth in the morning.     oxyCODONE (OXY  IR/ROXICODONE) 5 MG immediate release tablet Take 1 tablet (5 mg total) by mouth every 6 (six) hours as needed for severe pain. 30 tablet 0   potassium chloride (KLOR-CON M) 10 MEQ tablet Take 10 mEq by mouth every other day. In the morning     rosuvastatin (CRESTOR) 10 MG tablet Take 10 mg by mouth every Monday, Wednesday, and Friday. In the morning     salmeterol (SEREVENT DISKUS) 50 MCG/ACT diskus inhaler Inhale 1 puff into the lungs 2 (two) times daily. 1 each 4   telmisartan (MICARDIS) 40 MG tablet Take 40 mg by mouth at bedtime.     traZODone (DESYREL) 50 MG tablet Take 50 mg by mouth at bedtime.     zolpidem (AMBIEN) 10 MG tablet Take 10 mg by mouth at bedtime as needed for sleep.    Vital Signs: Vitals:   07/12/22 1331  BP: (!) 145/90  Pulse: 89  Resp: 20  Temp: 98.2 F (36.8 C)  SpO2: 95%      Physical Exam: CV-RRR Pulmonary-Slightly diminished right base, left lung is clear Wounds-Clean and dry. Silk suture (chest tube wound) removed.  Diagnostic Tests: Narrative & Impression  CLINICAL DATA:  Pain on the right   EXAM: CHEST - 2 VIEW   COMPARISON:  Chest x-ray July 05, 2022   FINDINGS: Air in the soft tissues  of the right chest wall has decreased since July 05, 2022. Increased haziness over the right costophrenic angle. Postsurgical changes in the right base. The lungs are otherwise clear. The cardiomediastinal silhouette is stable. No pneumothorax identified. Anterior wedging of lower thoracic vertebral bodies is stable. No other acute abnormalities.   IMPRESSION: 1. Air in the soft tissues of the right chest wall has decreased since July 05, 2022 when the patient's right chest tube was removed. It is possible this decreasing air is just residual unabsorbed air from the recent chest tube. Recommend clinical correlation. 2. Haziness over the right costophrenic angle may represent a small loculated anterior effusion, new in the interval. A CT scan  could further evaluate if clinically warranted. Otherwise, recommend follow-up to resolution. 3. No other acute abnormalities. Specifically, the previous small right apical pneumothorax has resolved.     Electronically Signed   By: Dorise Bullion III M.D.   On: 07/11/2022 15:09    Narrative & Impression  CLINICAL DATA:  Chest pain.   EXAM: CT ANGIOGRAPHY CHEST WITH CONTRAST   TECHNIQUE: Multidetector CT imaging of the chest was performed using the standard protocol during bolus administration of intravenous contrast. Multiplanar CT image reconstructions and MIPs were obtained to evaluate the vascular anatomy.   RADIATION DOSE REDUCTION: This exam was performed according to the departmental dose-optimization program which includes automated exposure control, adjustment of the mA and/or kV according to patient size and/or use of iterative reconstruction technique.   CONTRAST:  62mL OMNIPAQUE IOHEXOL 350 MG/ML SOLN   COMPARISON:  Radiograph of same day.  CT scan of June 30, 2022.   FINDINGS: Cardiovascular: Satisfactory opacification of the pulmonary arteries to the segmental level. No evidence of pulmonary embolism. Normal heart size. No pericardial effusion.   Mediastinum/Nodes: Thyroid gland is unremarkable. The esophagus appears normal. No definite adenopathy is noted.   Lungs/Pleura: No pneumothorax is noted. Left lung is clear. Small right pleural effusion is noted with subsegmental atelectasis of the right lower lobe.   Upper Abdomen: No acute abnormality.   Musculoskeletal: Nondisplaced fracture seen involving posterior portion of right ninth rib. Small amount of subcutaneous emphysema is seen laterally in the right chest wall.   Review of the MIP images confirms the above findings.   IMPRESSION: No definite evidence of pulmonary embolism.   Nondisplaced right ninth rib fracture. Small amount of subcutaneous emphysema seen laterally in right chest  wall.   Small right pleural effusion is noted with associated atelectasis of right lower lobe.   Aortic Atherosclerosis (ICD10-I70.0).     Electronically Signed   By: Marijo Conception M.D.   On: 07/11/2022 17:50     Impression and Plan: Patient's blood pressure likely elevated due to uncontrolled pain. There is no pneumonia on CXR or CT. Her WBC yesterday was 7,500 and blood culture shows no growth to date. We discussed better pain control. She was instructed may take Tylenol and Ibuprofen as directed. She was encouraged to take Oxy for severe pain (Q 6 hours PRN) and that she may take stool softener/laxative for constipation. Neurontin seems to help at night so I sent a new prescription for her to take 300 mg in the am and pm. If the Neurontin makes her "woozy", she may take at night only. She was instructed no driving until evaluated by Dr. Roxan Hockey (and not taking narcotic for pain). She will return to see Dr. Roxan Hockey on 02/06 with CXR in routine follow up.     Phinley Schall  Liston Alba, PA-C [facility] [phone]

## 2022-07-15 ENCOUNTER — Other Ambulatory Visit: Payer: Self-pay

## 2022-07-15 NOTE — Progress Notes (Signed)
The proposed treatment discussed in conference is for discussion purpose only and is not a binding recommendation.  The patients have not been physically examined, or presented with their treatment options.  Therefore, final treatment plans cannot be decided.  

## 2022-07-16 LAB — CULTURE, BLOOD (ROUTINE X 2)
Culture: NO GROWTH
Culture: NO GROWTH
Special Requests: ADEQUATE

## 2022-07-19 ENCOUNTER — Other Ambulatory Visit: Payer: Self-pay | Admitting: Thoracic Surgery (Cardiothoracic Vascular Surgery)

## 2022-07-19 DIAGNOSIS — C3490 Malignant neoplasm of unspecified part of unspecified bronchus or lung: Secondary | ICD-10-CM

## 2022-07-19 DIAGNOSIS — R911 Solitary pulmonary nodule: Secondary | ICD-10-CM

## 2022-07-20 ENCOUNTER — Ambulatory Visit
Admission: RE | Admit: 2022-07-20 | Discharge: 2022-07-20 | Disposition: A | Discharge status: Home / Self Care | Payer: Commercial Managed Care - HMO | Source: Ambulatory Visit | Attending: Thoracic Surgery (Cardiothoracic Vascular Surgery) | Admitting: Thoracic Surgery (Cardiothoracic Vascular Surgery)

## 2022-07-20 ENCOUNTER — Ambulatory Visit (INDEPENDENT_AMBULATORY_CARE_PROVIDER_SITE_OTHER): Payer: Self-pay | Admitting: Thoracic Surgery (Cardiothoracic Vascular Surgery)

## 2022-07-20 VITALS — BP 160/88 | HR 76 | Resp 20 | Ht 60.0 in | Wt 128.0 lb

## 2022-07-20 DIAGNOSIS — C3490 Malignant neoplasm of unspecified part of unspecified bronchus or lung: Secondary | ICD-10-CM

## 2022-07-20 DIAGNOSIS — C3491 Malignant neoplasm of unspecified part of right bronchus or lung: Secondary | ICD-10-CM

## 2022-07-20 DIAGNOSIS — Z9889 Other specified postprocedural states: Secondary | ICD-10-CM

## 2022-07-20 MED ORDER — GABAPENTIN 300 MG PO CAPS: 300.0000 mg | ORAL_CAPSULE | Freq: Three times a day (TID) | ORAL | 1 refills | Status: AC

## 2022-07-20 MED ORDER — OXYCODONE HCL 10 MG PO TABS: 10.0000 mg | ORAL_TABLET | Freq: Four times a day (QID) | ORAL | 0 refills | Status: DC | PRN

## 2022-07-20 NOTE — Progress Notes (Signed)
[address]       [place] [address]             [phone]      HPI: Emily Santiago returns for a scheduled follow-up visit after a right lower lobe superior segmentectomy.  Emily Santiago is a 62 year old woman with a history of tobacco abuse, COPD, COVID-19, aortic atherosclerosis, stage III chronic kidney disease, lymphoma, reflux, Barrett's esophagus, fibromyalgia, and osteoporosis.  She was found to have a lung nodule and a low-dose CT for lung cancer screening.  Over time it remains stable in size.  Bronchoscopy with biopsy showed adenocarcinoma.  I did a robotic assisted right lower lobe superior segmentectomy on 07/02/2022.  Her postoperative course was uncomplicated and she went home on day 4.  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  Currently on gabapentin twice daily.  Is also taking oxycodone 5 mg tablets, 2 tablets about 2-3 times a day.  Also taking Tylenol and ibuprofen.  Despite that says she continues to have severe pain that is interfering with her ability to eat, sleep, and her general quality of life.  Past Medical History:  Diagnosis Date   ADHD (attention deficit hyperactivity disorder)    Allergies    Anxiety    Aortic atherosclerosis (HCC)    Arthritis    WRIST AND BACK   Asthma    Atrophic vaginitis    CKD (chronic kidney disease), stage II    Complication of anesthesia    slow to wake up   COPD (chronic obstructive pulmonary disease) ([facility])    pt denies this dx   COVID 2022   states it was a bad case, not hospitalized   COVID-19    Depression    Diverticulosis    Dyspnea    Emphysema/COPD ([facility])    Family history of adverse reaction to anesthesia    mother was hard to wake up, acted weird afterwards   Fibromyalgia    Gastric ulcer    GERD (gastroesophageal reflux disease)    History of Barrett's esophagus    History of head and neck cancer    History of kidney stones    Hx of colonic polyps     Hyperlipidemia    Hypertension    Insomnia    Lymphoma (HCC)    Lymphoma   Melanosis coli    Osteopenia    Osteoporosis    Plantar fasciitis    Tobacco abuse    Tubular adenoma of colon    Vallecular mass    Varicose veins    Vitamin D deficiency     Current Outpatient Medications  Medication Sig Dispense Refill   amLODipine (NORVASC) 5 MG tablet Take 5 mg by mouth in the morning.     buPROPion (WELLBUTRIN SR) 150 MG 12 hr tablet Take 150 mg by mouth in the morning.     Cholecalciferol (DIALYVITE VITAMIN D 5000) 125 MCG (5000 UT) capsule Take 5,000 Units by mouth at bedtime.     Cyanocobalamin (VITAMIN B-12 PO) Take 1 tablet by mouth in the morning.     diclofenac (VOLTAREN) 50 MG EC tablet Take 50 mg by mouth in the morning.     esomeprazole (NEXIUM) 40 MG capsule Take 40 mg by mouth daily before breakfast.     fluticasone (FLONASE) 50 MCG/ACT nasal spray Place 1 spray into both nostrils daily as needed for allergies or rhinitis.     LORazepam (ATIVAN)  0.5 MG tablet Take 0.25 mg by mouth 2 (two) times daily as needed for anxiety.     Multiple Vitamin (MULTIVITAMIN WITH MINERALS) TABS tablet Take 1 tablet by mouth in the morning.     potassium chloride (KLOR-CON M) 10 MEQ tablet Take 10 mEq by mouth every other day. In the morning     rosuvastatin (CRESTOR) 10 MG tablet Take 10 mg by mouth every Monday, Wednesday, and Friday. In the morning     salmeterol (SEREVENT DISKUS) 50 MCG/ACT diskus inhaler Inhale 1 puff into the lungs 2 (two) times daily. 1 each 4   telmisartan (MICARDIS) 40 MG tablet Take 40 mg by mouth at bedtime.     traZODone (DESYREL) 50 MG tablet Take 50 mg by mouth at bedtime.     zolpidem (AMBIEN) 10 MG tablet Take 10 mg by mouth at bedtime as needed for sleep.     gabapentin (NEURONTIN) 300 MG capsule Take 1 capsule (300 mg total) by mouth 3 (three) times daily. In the morning and at bedtime 90 capsule 1   oxyCODONE 10 MG TABS Take 1 tablet (10 mg total) by mouth  every 6 (six) hours as needed for severe pain. 28 tablet 0   No current facility-administered medications for this visit.    Physical Exam BP (!) 160/88   Pulse 76   Resp 20   Ht 5' (1.524 m)   Wt 128 lb (58.1 kg)   SpO2 95% Comment: RA  BMI 25.9 kg/m  62 year old woman in no acute distress, anxious, Lungs clear with equal sounds bilaterally Cardiac regular rate and rhythm Incisions healing well No peripheral edema  Diagnostic Tests: I personally reviewed her chest x-ray.  Shows postoperative changes.  No concerning findings.  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  Adenocarcinoma right lower lobe-clinical and pathologic stage Ia (T1, N0).  Will refer to Dr. Julien Nordmann for consultation.  She should not need any adjuvant therapy, but will need continued follow-up.  Status post right lower lobe superior segmentectomy-no respiratory issues, but significant postoperative intercostal neuralgia.  She is requesting 10 mg oxycodone tablets.  I gave her prescription for oxycodone, 10 mg tablets, 1 p.o. up to 4 times a day, 28 tablets, no refills.  That is enough for her to take it twice a day for 2 weeks.  I will see her back at that time and we will plan to go back down to 5 mg tablets.  Increase gabapentin to 300 mg p.o. 3 times daily.  Explained the different mechanisms of action and the gabapentin actually help the pain resolved rather than just mask it.  Overall her pain is not really forced to other patients have seen just over 2 weeks out from surgery.  Will improve with time.  A CT she had a couple weeks ago did show a very small nondisplaced ninth rib fracture from one of the robotic arms.  Plan: Pain medication as above Return in 2 weeks to check on  progress Does not need a chest x-ray at that time unless new issues arise.  Melrose Nakayama, MD [facility] [phone]

## 2022-07-23 ENCOUNTER — Telehealth: Payer: Self-pay | Admitting: Internal Medicine

## 2022-07-23 NOTE — Telephone Encounter (Signed)
Scheduled appt per 2/6 referral. Pt is aware of appt date and time. Pt is aware to arrive 15 mins prior to appt time and to bring and updated insurance card. Pt is aware of appt location.

## 2022-07-26 ENCOUNTER — Other Ambulatory Visit: Payer: Commercial Managed Care - HMO

## 2022-07-27 ENCOUNTER — Other Ambulatory Visit: Payer: Self-pay

## 2022-07-27 DIAGNOSIS — C3490 Malignant neoplasm of unspecified part of unspecified bronchus or lung: Secondary | ICD-10-CM

## 2022-07-29 ENCOUNTER — Inpatient Hospital Stay: Payer: Commercial Managed Care - HMO | Attending: Internal Medicine | Admitting: Internal Medicine

## 2022-07-29 ENCOUNTER — Other Ambulatory Visit: Payer: Self-pay

## 2022-07-29 ENCOUNTER — Inpatient Hospital Stay: Payer: Commercial Managed Care - HMO

## 2022-07-29 ENCOUNTER — Encounter: Payer: Self-pay | Admitting: Medical Oncology

## 2022-07-29 VITALS — BP 131/80 | HR 73 | Temp 98.2°F | Resp 15 | Wt 136.9 lb

## 2022-07-29 DIAGNOSIS — R5383 Other fatigue: Secondary | ICD-10-CM | POA: Diagnosis not present

## 2022-07-29 DIAGNOSIS — I129 Hypertensive chronic kidney disease with stage 1 through stage 4 chronic kidney disease, or unspecified chronic kidney disease: Secondary | ICD-10-CM | POA: Insufficient documentation

## 2022-07-29 DIAGNOSIS — C3490 Malignant neoplasm of unspecified part of unspecified bronchus or lung: Secondary | ICD-10-CM

## 2022-07-29 DIAGNOSIS — Z801 Family history of malignant neoplasm of trachea, bronchus and lung: Secondary | ICD-10-CM

## 2022-07-29 DIAGNOSIS — Z87891 Personal history of nicotine dependence: Secondary | ICD-10-CM | POA: Diagnosis not present

## 2022-07-29 DIAGNOSIS — N189 Chronic kidney disease, unspecified: Secondary | ICD-10-CM | POA: Insufficient documentation

## 2022-07-29 DIAGNOSIS — Z808 Family history of malignant neoplasm of other organs or systems: Secondary | ICD-10-CM

## 2022-07-29 DIAGNOSIS — Z8042 Family history of malignant neoplasm of prostate: Secondary | ICD-10-CM

## 2022-07-29 DIAGNOSIS — C3431 Malignant neoplasm of lower lobe, right bronchus or lung: Secondary | ICD-10-CM | POA: Insufficient documentation

## 2022-07-29 LAB — COMPREHENSIVE METABOLIC PANEL
ALT: 8 U/L (ref 0–44)
AST: 11 U/L — ABNORMAL LOW (ref 15–41)
Albumin: 4.1 g/dL (ref 3.5–5.0)
Alkaline Phosphatase: 86 U/L (ref 38–126)
Anion gap: 8 (ref 5–15)
BUN: 18 mg/dL (ref 8–23)
CO2: 29 mmol/L (ref 22–32)
Calcium: 9.5 mg/dL (ref 8.9–10.3)
Chloride: 102 mmol/L (ref 98–111)
Creatinine, Ser: 0.7 mg/dL (ref 0.44–1.00)
GFR, Estimated: >60 mL/min (ref >60)
Glucose, Bld: 74 mg/dL (ref 70–99)
Potassium: 4.2 mmol/L (ref 3.5–5.1)
Sodium: 139 mmol/L (ref 135–145)
Total Bilirubin: 0.4 mg/dL (ref 0.3–1.2)
Total Protein: 6.6 g/dL (ref 6.5–8.1)

## 2022-07-29 LAB — CBC WITH DIFFERENTIAL/PLATELET
Abs Immature Granulocytes: 0.02 10*3/uL (ref 0.00–0.07)
Basophils Absolute: 0 10*3/uL (ref 0.0–0.1)
Basophils Relative: 0 %
Eosinophils Absolute: 0.2 10*3/uL (ref 0.0–0.5)
Eosinophils Relative: 3 %
HCT: 38.1 % (ref 36.0–46.0)
Hemoglobin: 12.8 g/dL (ref 12.0–15.0)
Immature Granulocytes: 0 %
Lymphocytes Relative: 20 %
Lymphs Abs: 1.3 10*3/uL (ref 0.7–4.0)
MCH: 30.4 pg (ref 26.0–34.0)
MCHC: 33.6 g/dL (ref 30.0–36.0)
MCV: 90.5 fL (ref 80.0–100.0)
Monocytes Absolute: 0.5 10*3/uL (ref 0.1–1.0)
Monocytes Relative: 7 %
Neutro Abs: 4.6 10*3/uL (ref 1.7–7.7)
Neutrophils Relative %: 70 %
Platelets: 211 10*3/uL (ref 150–400)
RBC: 4.21 MIL/uL (ref 3.87–5.11)
RDW: 12.7 % (ref 11.5–15.5)
WBC: 6.6 10*3/uL (ref 4.0–10.5)
nRBC: 0 % (ref 0.0–0.2)

## 2022-07-29 NOTE — Progress Notes (Signed)
[place] Telephone:([phone]) [phone]   Fax:(336) [phone]  CONSULT NOTE  REFERRING PHYSICIAN: Dr. Modesto Charon  REASON FOR CONSULTATION:  62 years old white female recently diagnosed with lung cancer  HPI Emily Santiago is a 62 y.o. female with past medical history significant for anxiety, ADHD, aortic atherosclerosis, osteoarthritis, chronic kidney disease, COPD hypertension, dyslipidemia, GERD, kidney stones, Barrett's esophagus, history of head and neck cancer, lymphoma, vitamin D deficiency.  The patient had CT screening of the chest on December 28, 2021 and that showed new partially solid nodule of the right lower lobe measuring 8.5 mm in overall diameter with 4.7 mm solid component.  CT super D of the chest on March 31, 2022 showed 0.8 cm subsolid nodule in the medial right lower lobe stable from the previous December 28, 2021 scan but new since March 29, 2018 worrisome for adenocarcinoma.  The patient had bronchoscopy on May 03, 2022 and the right lower lobe biopsy showed malignant cells consistent with well-differentiated adenocarcinoma.  She was referred to Dr. Roxan Hockey and on July 02, 2022 she underwent right lower lobe superior segmentectomy with lymph node dissection.  The final pathology ([id number]) showed well-differentiated adenocarcinoma, lipidic pattern with tumor measuring 0.8 cm in greatest dimension with negative resection margin and the dissected lymph nodes were negative for malignancy.  There was no evidence for lymphovascular invasion or visceropleural involvement. Dr. Roxan Hockey kindly referred the patient to me today for evaluation and recommendation regarding her condition. When seen today she continues to have pain on the right side from the rib fracture during her surgery.  She also has mild shortness of breath with exertion but no significant cough or hemoptysis.  She lost around 25 pounds in the last few months.  She has intermittent  nausea but no significant vomiting, abdominal pain, diarrhea or constipation.  She has no headache or visual changes. Family history significant for father with history of cancer.  Brother had lung cancer and he is my patient.  Mother is healthy. The patient is single and has no children.  She works as a Building control surveyor.  She was accompanied by her mother [place].  She has a history of smoking more than 1 pack/day for around 42 years and quit a year ago.  She has no history of alcohol or drug abuse.  HPI  Past Medical History:  Diagnosis Date   ADHD (attention deficit hyperactivity disorder)    Allergies    Anxiety    Aortic atherosclerosis (HCC)    Arthritis    WRIST AND BACK   Asthma    Atrophic vaginitis    CKD (chronic kidney disease), stage II    Complication of anesthesia    slow to wake up   COPD (chronic obstructive pulmonary disease) ([place])    pt denies this dx   COVID 2022   states it was a bad case, not hospitalized   COVID-19    Depression    Diverticulosis    Dyspnea    Emphysema/COPD ([place])    Family history of adverse reaction to anesthesia    mother was hard to wake up, acted weird afterwards   Fibromyalgia    Gastric ulcer    GERD (gastroesophageal reflux disease)    History of Barrett's esophagus    History of head and neck cancer    History of kidney stones    Hx of colonic polyps    Hyperlipidemia    Hypertension    Insomnia  Lymphoma ([place])    Lymphoma   Melanosis coli    Osteopenia    Osteoporosis    Plantar fasciitis    Tobacco abuse    Tubular adenoma of colon    Vallecular mass    Varicose veins    Vitamin D deficiency     Past Surgical History:  Procedure Laterality Date   ABDOMINAL HYSTERECTOMY     APPENDECTOMY     BILATERAL SALPINGOOPHORECTOMY     BIOPSY  02/25/2022   Procedure: BIOPSY;  Surgeon: Doran Stabler, MD;  Location: [facility];  Service: Gastroenterology;;   BRONCHIAL BIOPSY  05/03/2022   Procedure: BRONCHIAL BIOPSIES;   Surgeon: Garner Nash, DO;  Location: [facility];  Service: Pulmonary;;   BRONCHIAL BRUSHINGS  05/03/2022   Procedure: BRONCHIAL BRUSHINGS;  Surgeon: Garner Nash, DO;  Location: [place];  Service: Pulmonary;;   CHOLECYSTECTOMY     COLONOSCOPY WITH PROPOFOL N/A 02/25/2022   Procedure: COLONOSCOPY WITH PROPOFOL;  Surgeon: Doran Stabler, MD;  Location: [facility];  Service: Gastroenterology;  Laterality: N/A;   ESOPHAGEAL MANOMETRY N/A 04/28/2016   Procedure: ESOPHAGEAL MANOMETRY (EM);  Surgeon: Mauri Pole, MD;  Location: [facility];  Service: Endoscopy;  Laterality: N/A;  dr. Loletha Carrow   ESOPHAGOGASTRODUODENOSCOPY (EGD) WITH PROPOFOL N/A 02/25/2022   Procedure: ESOPHAGOGASTRODUODENOSCOPY (EGD) WITH PROPOFOL;  Surgeon: Doran Stabler, MD;  Location: [facility];  Service: Gastroenterology;  Laterality: N/A;   FIDUCIAL MARKER PLACEMENT  07/02/2022   Procedure: FIDUCIAL DYE MARKING;  Surgeon: Maryjane Hurter, MD;  Location: [facility];  Service: Pulmonary;;   KNEE ARTHROSCOPY     LYMPH NODE BIOPSY     TONSILLECTOMY     UPPER GASTROINTESTINAL ENDOSCOPY     WRIST SURGERY Right    XI ROBOTIC ASSISTED THORACOSCOPY- SEGMENTECTOMY Right 07/02/2022   Procedure: XI ROBOTIC ASSISTED THORACOSCOPY-RIGHT LOWER LOBE SEGMENTECTOMY;  Surgeon: Melrose Nakayama, MD;  Location: [facility];  Service: Thoracic;  Laterality: Right;    Family History  Problem Relation Age of Onset   Heart disease Father    Colon cancer Father        dx in his 41's   Diabetes Father        metastatic   Colon polyps Father    Heart disease Maternal Grandmother    Breast cancer Maternal Grandmother    Prostate cancer Maternal Uncle    Brain cancer Brother    Liver cancer Brother    Bone cancer Brother    Prostate cancer Brother    Hyperlipidemia Mother    Colon polyps Mother    Breast cancer Cousin        x3   Stomach cancer Neg Hx    Pancreatic cancer Neg Hx    Esophageal cancer Neg  Hx    Rectal cancer Neg Hx     Social History Social History   Tobacco Use   Smoking status: Former    Years: 39.00    Types: Cigarettes    Quit date: 2022    Years since quitting: 2.1   Smokeless tobacco: Never  Vaping Use   Vaping Use: Never used  Substance Use Topics   Alcohol use: No   Drug use: No    Allergies  Allergen Reactions   Hydrocodone-Acetaminophen Anaphylaxis    Throat/tongue swells   Penicillins Nausea Only and Rash    Stomach upset Has patient had a PCN reaction causing immediate rash, facial/tongue/throat swelling, SOB or lightheadedness with  hypotension: No Has patient had a PCN reaction causing severe rash involving mucus membranes or skin necrosis: Yes Has patient had a PCN reaction that required hospitalization: NO Has patient had a PCN reaction occurring within the last 10 years: Yes If all of the above answers are "NO", then may proceed with Cephalosporin use.     Zestril [Lisinopril] Cough   Chlorthalidone     Other Reaction(s): hyponatremia   Latex Itching    Current Outpatient Medications  Medication Sig Dispense Refill   amLODipine (NORVASC) 5 MG tablet Take 5 mg by mouth in the morning.     buPROPion (WELLBUTRIN SR) 150 MG 12 hr tablet Take 150 mg by mouth in the morning.     Cholecalciferol (DIALYVITE VITAMIN D 5000) 125 MCG (5000 UT) capsule Take 5,000 Units by mouth at bedtime.     Cyanocobalamin (VITAMIN B-12 PO) Take 1 tablet by mouth in the morning.     diclofenac (VOLTAREN) 50 MG EC tablet Take 50 mg by mouth in the morning.     esomeprazole (NEXIUM) 40 MG capsule Take 40 mg by mouth daily before breakfast.     fluticasone (FLONASE) 50 MCG/ACT nasal spray Place 1 spray into both nostrils daily as needed for allergies or rhinitis.     gabapentin (NEURONTIN) 300 MG capsule Take 1 capsule (300 mg total) by mouth 3 (three) times daily. In the morning and at bedtime 90 capsule 1   LORazepam (ATIVAN) 0.5 MG tablet Take 0.25 mg by mouth 2  (two) times daily as needed for anxiety.     Multiple Vitamin (MULTIVITAMIN WITH MINERALS) TABS tablet Take 1 tablet by mouth in the morning.     oxyCODONE 10 MG TABS Take 1 tablet (10 mg total) by mouth every 6 (six) hours as needed for severe pain. 28 tablet 0   potassium chloride (KLOR-CON M) 10 MEQ tablet Take 10 mEq by mouth every other day. In the morning     rosuvastatin (CRESTOR) 10 MG tablet Take 10 mg by mouth every Monday, Wednesday, and Friday. In the morning     salmeterol (SEREVENT DISKUS) 50 MCG/ACT diskus inhaler Inhale 1 puff into the lungs 2 (two) times daily. 1 each 4   telmisartan (MICARDIS) 40 MG tablet Take 40 mg by mouth at bedtime.     traZODone (DESYREL) 50 MG tablet Take 50 mg by mouth at bedtime.     zolpidem (AMBIEN) 10 MG tablet Take 10 mg by mouth at bedtime as needed for sleep.     No current facility-administered medications for this visit.    Review of Systems  Constitutional: positive for fatigue and weight loss Eyes: negative Ears, nose, mouth, throat, and face: negative Respiratory: positive for dyspnea on exertion and pleurisy/chest pain Cardiovascular: negative Gastrointestinal: negative Genitourinary:negative Integument/breast: negative Hematologic/lymphatic: negative Musculoskeletal:negative Neurological: negative Behavioral/Psych: negative Endocrine: negative Allergic/Immunologic: negative  Physical Exam  IRW:ERXVQ, healthy, no distress, well nourished, well developed, and anxious SKIN: skin color, texture, turgor are normal, no rashes or significant lesions HEAD: Normocephalic, No masses, lesions, tenderness or abnormalities EYES: normal, PERRLA, Conjunctiva are pink and non-injected EARS: External ears normal, Canals clear OROPHARYNX:no exudate, no erythema, and lips, buccal mucosa, and tongue normal  NECK: supple, no adenopathy, no JVD LYMPH:  no palpable lymphadenopathy, no hepatosplenomegaly BREAST:not examined LUNGS: clear to  auscultation , and palpation HEART: regular rate & rhythm, no murmurs, and no gallops ABDOMEN:abdomen soft, non-tender, normal bowel sounds, and no masses or organomegaly BACK: Back symmetric, no  curvature., No CVA tenderness EXTREMITIES:no joint deformities, effusion, or inflammation, no edema  NEURO: alert & oriented x 3 with fluent speech, no focal motor/sensory deficits  PERFORMANCE STATUS: ECOG 1  LABORATORY DATA: Lab Results  Component Value Date   WBC 6.6 07/29/2022   HGB 12.8 07/29/2022   HCT 38.1 07/29/2022   MCV 90.5 07/29/2022   PLT 211 07/29/2022      Chemistry      Component Value Date/Time   NA 135 07/11/2022 1357   NA 137 04/22/2017 1407   K 3.7 07/11/2022 1357   K 5.0 04/22/2017 1407   CL 102 07/11/2022 1357   CO2 21 (L) 07/11/2022 1357   CO2 24 04/22/2017 1407   BUN 9 07/11/2022 1357   BUN 21.6 04/22/2017 1407   CREATININE 0.50 07/11/2022 1357   CREATININE 0.8 04/22/2017 1407      Component Value Date/Time   CALCIUM 9.7 07/11/2022 1357   CALCIUM 9.5 04/22/2017 1407   ALKPHOS 77 07/11/2022 1357   ALKPHOS 91 04/22/2017 1407   AST 15 07/11/2022 1357   AST 18 04/22/2017 1407   ALT 21 07/11/2022 1357   ALT 16 04/22/2017 1407   BILITOT 0.4 07/11/2022 1357   BILITOT 0.55 04/22/2017 1407       RADIOGRAPHIC STUDIES: DG Chest 2 View  Result Date: 07/20/2022 CLINICAL DATA:  History of right sided lung cancer status post surgery 07/02/2022. Right chest pain. EXAM: CHEST - 2 VIEW COMPARISON:  Chest radiographs 07/11/2022, 07/05/2022, 06/29/2022; PET-CT 05/27/2022, CTA chest 07/11/2022 FINDINGS: Cardiac silhouette and mediastinal contours are within normal limits. Postsurgical changes are again seen of the inferior right lung including volume loss, surgical suture, and right inferior and lateral likely pleural fluid as seen on prior 07/11/2022 CT. Resolution of the prior inferolateral right chest wall subcutaneous air related to the right-sided chest tube seen on  07/05/2022 radiograph. No new lung airspace opacity. No left pleural effusion. No pneumothorax. No acute skeletal abnormality. IMPRESSION: Postsurgical changes of the inferior right lung including volume loss, surgical suture, and right inferior and lateral pleural fluid, not significantly changed from 07/11/2022 radiograph. No new lung airspace opacity. Resolution of the prior inferolateral right chest wall subcutaneous air from prior chest tube seen on 07/05/2022 radiograph. Electronically Signed   By: Yvonne Kendall M.D.   On: 07/20/2022 10:07   CT Angio Chest PE W and/or Wo Contrast  Result Date: 07/11/2022 CLINICAL DATA:  Chest pain. EXAM: CT ANGIOGRAPHY CHEST WITH CONTRAST TECHNIQUE: Multidetector CT imaging of the chest was performed using the standard protocol during bolus administration of intravenous contrast. Multiplanar CT image reconstructions and MIPs were obtained to evaluate the vascular anatomy. RADIATION DOSE REDUCTION: This exam was performed according to the departmental dose-optimization program which includes automated exposure control, adjustment of the mA and/or kV according to patient size and/or use of iterative reconstruction technique. CONTRAST:  26mL OMNIPAQUE IOHEXOL 350 MG/ML SOLN COMPARISON:  Radiograph of same day.  CT scan of June 30, 2022. FINDINGS: Cardiovascular: Satisfactory opacification of the pulmonary arteries to the segmental level. No evidence of pulmonary embolism. Normal heart size. No pericardial effusion. Mediastinum/Nodes: Thyroid gland is unremarkable. The esophagus appears normal. No definite adenopathy is noted. Lungs/Pleura: No pneumothorax is noted. Left lung is clear. Small right pleural effusion is noted with subsegmental atelectasis of the right lower lobe. Upper Abdomen: No acute abnormality. Musculoskeletal: Nondisplaced fracture seen involving posterior portion of right ninth rib. Small amount of subcutaneous emphysema is seen laterally in the right  chest wall. Review of the MIP images confirms the above findings. IMPRESSION: No definite evidence of pulmonary embolism. Nondisplaced right ninth rib fracture. Small amount of subcutaneous emphysema seen laterally in right chest wall. Small right pleural effusion is noted with associated atelectasis of right lower lobe. Aortic Atherosclerosis (ICD10-I70.0). Electronically Signed   By: Marijo Conception M.D.   On: 07/11/2022 17:50   DG Chest 2 View  Result Date: 07/11/2022 CLINICAL DATA:  Pain on the right EXAM: CHEST - 2 VIEW COMPARISON:  Chest x-ray July 05, 2022 FINDINGS: Air in the soft tissues of the right chest wall has decreased since July 05, 2022. Increased haziness over the right costophrenic angle. Postsurgical changes in the right base. The lungs are otherwise clear. The cardiomediastinal silhouette is stable. No pneumothorax identified. Anterior wedging of lower thoracic vertebral bodies is stable. No other acute abnormalities. IMPRESSION: 1. Air in the soft tissues of the right chest wall has decreased since July 05, 2022 when the patient's right chest tube was removed. It is possible this decreasing air is just residual unabsorbed air from the recent chest tube. Recommend clinical correlation. 2. Haziness over the right costophrenic angle may represent a small loculated anterior effusion, new in the interval. A CT scan could further evaluate if clinically warranted. Otherwise, recommend follow-up to resolution. 3. No other acute abnormalities. Specifically, the previous small right apical pneumothorax has resolved. Electronically Signed   By: Dorise Bullion III M.D.   On: 07/11/2022 15:09   DG Chest 2 View  Result Date: 07/05/2022 CLINICAL DATA:  Chest tube removal.  History of lung nodule. EXAM: CHEST - 2 VIEW COMPARISON:  07/04/2022. FINDINGS: 0622 hours. Trace right apical pneumothorax with associated subcutaneous emphysema along the right neck and right chest wall. Stable  postoperative changes of right lower lobe wedge resection with persistent opacity, likely contusion, surrounding the chain sutures. Mild blunting of the right costophrenic sulcus. Stable cardiac and mediastinal contours. Left lung is well aerated. IMPRESSION: 1. Trace right apical pneumothorax with associated subcutaneous emphysema along the right neck and right chest wall. 2. Stable postoperative changes of right lower lobe wedge resection. Electronically Signed   By: Emmit Alexanders M.D.   On: 07/05/2022 08:08   DG Chest 1V REPEAT Same Day  Result Date: 07/04/2022 CLINICAL DATA:  Chest tube removal. EXAM: CHEST - 1 VIEW SAME DAY COMPARISON:  07/04/2022 at 5:21 a.m. FINDINGS: Interval removal of right-sided chest tube. Postsurgical change over the right base/infrahilar region. Patchy density over the right base without significant change. No significant right-sided pneumothorax. Left lung is clear. Cardiomediastinal silhouette and remainder of the exam is unchanged. IMPRESSION: Postsurgical change over the right base/infrahilar region. Patchy density over the right lung base likely atelectasis, although infection is possible. Interval removal right chest tube. No significant right pneumothorax. Electronically Signed   By: Marin Olp M.D.   On: 07/04/2022 13:50   DG CHEST PORT 1 VIEW  Result Date: 07/04/2022 CLINICAL DATA:  Partial lobectomy EXAM: PORTABLE CHEST 1 VIEW COMPARISON:  Yesterday FINDINGS: Right chest tube with tip at the apex. Increased versus better visualized chest wall emphysema on the right. Increased hazy density at the right base. Trace apical pneumothorax. Stable heart size and left pulmonary aeration. IMPRESSION: 1. Increased opacity at the postoperative right base, presumably atelectasis. 2. Trace right apical pneumothorax. Electronically Signed   By: Jorje Guild M.D.   On: 07/04/2022 08:27   DG Chest Port 1 View  Result Date: 07/03/2022 CLINICAL DATA:  Pneumothorax. EXAM:  PORTABLE CHEST 1 VIEW COMPARISON:  07/02/2022 FINDINGS: Right-sided chest tube unchanged. Stable small right pneumothorax. Surgical suture line over the medial right lower lung. Mild stable opacification over the right infrahilar region/medial lung base likely atelectasis/postsurgical changes. Left lung is clear. Minimal subcutaneous emphysema over the right neck and right flank. Cardiomediastinal silhouette and remainder of the exam is unchanged. IMPRESSION: 1. Stable small right pneumothorax with right-sided chest tube unchanged. 2. Stable opacification over the right infrahilar region/medial lung base likely atelectasis/postsurgical changes. Electronically Signed   By: Marin Olp M.D.   On: 07/03/2022 08:24   DG Chest Port 1 View  Result Date: 07/02/2022 CLINICAL DATA:  Pneumothorax, chest tube, postop EXAM: PORTABLE CHEST 1 VIEW COMPARISON:  Portable exam 1333 hours compared to 06/29/2022 FINDINGS: LEFT thoracostomy tube present. RIGHT pneumothorax identified predominantly at apex but also noted at inferior RIGHT hemithorax. Staple line with atelectasis/scarring at RIGHT base. LEFT lung clear. Heart size stable. IMPRESSION: RIGHT pneumothorax despite thoracostomy tube. Postsurgical changes inferior RIGHT lung. Electronically Signed   By: Lavonia Dana M.D.   On: 07/02/2022 14:04   DG ATTEMPTED STUDY-NO REPORT  Result Date: 07/02/2022 There is no Radiologist interpretation  for this exam.  DG C-ARM BRONCHOSCOPY  Result Date: 07/02/2022 C-ARM BRONCHOSCOPY: Fluoroscopy was utilized by the requesting physician.  No radiographic interpretation.   DG Chest 2 View  Result Date: 07/01/2022 CLINICAL DATA:  Preoperative chest. [id number]. Adenocarcinoma of the right lung. EXAM: CHEST - 2 VIEW COMPARISON:  05/03/2022 FINDINGS: Mild hyperinflation suggesting emphysema. Heart size and pulmonary vascularity are normal. Surgical marker in the right medial posterior lung, likely superior segment of the right lower  lung. Lungs are clear. No pleural effusions. No pneumothorax. Mediastinal contours appear intact. Degenerative changes in the spine and shoulders. Surgical clips in the right upper quadrant. IMPRESSION: No active cardiopulmonary disease. Electronically Signed   By: Lucienne Capers M.D.   On: 07/01/2022 00:01   CT Super D Chest Wo Contrast  Result Date: 06/30/2022 CLINICAL DATA:  Pulmonary nodule. History of lymphoma, emphysema and asthma EXAM: CT CHEST WITHOUT CONTRAST TECHNIQUE: Multidetector CT imaging of the chest was performed using thin slice collimation for electromagnetic bronchoscopy planning purposes, without intravenous contrast. RADIATION DOSE REDUCTION: This exam was performed according to the departmental dose-optimization program which includes automated exposure control, adjustment of the mA and/or kV according to patient size and/or use of iterative reconstruction technique. COMPARISON:  CT 03/31/2022 and older.  PET-CT 05/27/2022 FINDINGS: Cardiovascular: Trace pericardial fluid or thickening. Heart is nonenlarged. On this non IV contrast exam the thoracic aorta has a normal course and caliber with mild scattered vascular calcifications. Coronary artery calcifications are seen. Mediastinum/Nodes: On this non IV contrast exam there is no specific abnormal lymph node enlargement present in the axillary regions, hilum or mediastinum. Normal caliber thoracic esophagus. Lungs/Pleura: Slight linear opacity at the lung bases likely scar or atelectasis. No consolidation, pneumothorax or effusion. Few air cysts scattered in the right lung. There is a metallic focus in the right lower lobe medially on series 8, image 73 with an adjacent small spiculated ill-defined nodule. Previously this nodule measured 7 x 9 mm and today on image 76 8 x 6 mm. There is some reticulonodular changes identified along the extreme inferolateral right upper lobe such as axial image 77 of series 8, unchanged from previous.  Similar stable areas along the inferior medial middle lobe. No new dominant nodule identified. Upper Abdomen: In the upper abdomen the adrenal glands  are preserved. Previous cholecystectomy. Musculoskeletal: Curvature and degenerative changes noted along the spine. Slight wedge deformity seen of the T12 level with sclerotic margins, likely chronic and unchanged from prior. IMPRESSION: Essentially stable nodule in the right lower lobe with adjacent clip. Subtle reticulonodular changes in the inferolateral aspect of the right upper lobe and middle lobe are stable. Aortic Atherosclerosis (ICD10-I70.0) and Emphysema (ICD10-J43.9). Electronically Signed   By: Jill Side M.D.   On: 06/30/2022 19:57    ASSESSMENT: This is a very pleasant 62 years old white female recently diagnosed with a stage Ia (T1 a, N0, M0) non-small cell lung cancer, adenocarcinoma presented with right lower lobe lung nodule status post right lower lobe superior segmentectomy under the care of Dr. Roxan Hockey on July 02, 2022 with tumor size of 0.8 cm and no evidence for visceral pleural or lymphovascular invasion and the dissected lymph nodes were negative for malignancy.  Her brother is my patient and he has ALK positive adenocarcinoma of the lung and has been on treatment with targeted therapy for more than 10 years.   PLAN: I had a lengthy discussion with the patient and her mother about her current disease stage, prognosis and treatment options. I explained to the patient that she had the curable treatment option with the surgical resection.  I also explained to the patient that there is no benefit for adjuvant systemic chemotherapy, targeted therapy, immunotherapy or radiation for patient with early stage non-small cell lung cancer and tumor size of 0.8 cm. I recommended for the patient to continue on observation with repeat CT scan of the chest in 6 months. She was advised to call immediately if she has any concerning symptoms in  the interval. The patient voices understanding of current disease status and treatment options and is in agreement with the current care plan.  All questions were answered. The patient knows to call the clinic with any problems, questions or concerns. We can certainly see the patient much sooner if necessary.  Thank you so much for allowing me to participate in the care of ETTER ROYALL. I will continue to follow up the patient with you and assist in her care.  The total time spent in the appointment was 60 minutes.  Disclaimer: This note was dictated with voice recognition software. Similar sounding words can inadvertently be transcribed and may not be corrected upon review.   Eilleen Kempf July 29, 2022, 11:39 AM

## 2022-08-03 ENCOUNTER — Encounter: Payer: Self-pay | Admitting: Thoracic Surgery (Cardiothoracic Vascular Surgery)

## 2022-08-03 ENCOUNTER — Telehealth: Payer: Self-pay | Admitting: *Deleted

## 2022-08-03 ENCOUNTER — Ambulatory Visit (INDEPENDENT_AMBULATORY_CARE_PROVIDER_SITE_OTHER): Payer: Self-pay | Admitting: Thoracic Surgery (Cardiothoracic Vascular Surgery)

## 2022-08-03 VITALS — BP 151/85 | HR 73 | Resp 18 | Ht 60.0 in | Wt 137.0 lb

## 2022-08-03 DIAGNOSIS — Z9889 Other specified postprocedural states: Secondary | ICD-10-CM

## 2022-08-03 NOTE — Progress Notes (Signed)
[address]       [place] [address]             [phone]     HPI: Ms. Lacerte returns for scheduled follow-up after right lower lobe superior segmentectomy.  Emily Santiago is a 62 year old woman with history of tobacco abuse, COPD, COVID-19, aortic atherosclerosis, stage III chronic kidney disease, lymphoma, reflux, Barrett's esophagus, fibromyalgia, and osteoporosis.  She underwent a robotic assisted right lower lobe superior segmentectomy on 07/02/2022.  No immediate postoperative complications.  I saw her back on 07/20/2022 and she was having a lot of intercostal neuralgia.  We increased her gabapentin and also gave her a stronger dose oxycodone.  Also recommend she continue taking Tylenol and ibuprofen.  She saw Dr. Julien Nordmann.  No adjuvant therapy is indicated.  In the interim since her last visit, she has had significant improvement in her pain.  She is only taking the oxycodone occasionally.  She did not have to take it at all yesterday still has some questions about taking gabapentin and other medications.  Still complains of sharp pains along the right costal margin, at times takes her breath away but less frequent than previously.  Past Medical History:  Diagnosis Date   ADHD (attention deficit hyperactivity disorder)    Allergies    Anxiety    Aortic atherosclerosis (HCC)    Arthritis    WRIST AND BACK   Asthma    Atrophic vaginitis    CKD (chronic kidney disease), stage II    Complication of anesthesia    slow to wake up   COPD (chronic obstructive pulmonary disease) ([place])    pt denies this dx   COVID 2022   states it was a bad case, not hospitalized   COVID-19    Depression    Diverticulosis    Dyspnea    Emphysema/COPD ([place])    Family history of adverse reaction to anesthesia    mother was hard to wake up, acted weird afterwards   Fibromyalgia    Gastric ulcer    GERD (gastroesophageal reflux disease)    History of Barrett's esophagus     History of head and neck cancer    History of kidney stones    Hx of colonic polyps    Hyperlipidemia    Hypertension    Insomnia    Lymphoma (HCC)    Lymphoma   Melanosis coli    Osteopenia    Osteoporosis    Plantar fasciitis    Tobacco abuse    Tubular adenoma of colon    Vallecular mass    Varicose veins    Vitamin D deficiency     Current Outpatient Medications  Medication Sig Dispense Refill   amLODipine (NORVASC) 5 MG tablet Take 5 mg by mouth in the morning.     buPROPion (WELLBUTRIN SR) 150 MG 12 hr tablet Take 150 mg by mouth in the morning.     Cholecalciferol (DIALYVITE VITAMIN D 5000) 125 MCG (5000 UT) capsule Take 5,000 Units by mouth at bedtime.     Cyanocobalamin (VITAMIN B-12 PO) Take 1 tablet by mouth in the morning.     diclofenac (VOLTAREN) 50 MG EC tablet Take 50 mg by mouth in the morning.     esomeprazole (NEXIUM) 40 MG capsule Take 40 mg by mouth daily before breakfast.     fluticasone (FLONASE) 50 MCG/ACT nasal spray Place 1 spray into both nostrils daily as needed for allergies  or rhinitis.     gabapentin (NEURONTIN) 300 MG capsule Take 1 capsule (300 mg total) by mouth 3 (three) times daily. In the morning and at bedtime 90 capsule 1   LORazepam (ATIVAN) 0.5 MG tablet Take 0.25 mg by mouth 2 (two) times daily as needed for anxiety.     Multiple Vitamin (MULTIVITAMIN WITH MINERALS) TABS tablet Take 1 tablet by mouth in the morning.     oxyCODONE 10 MG TABS Take 1 tablet (10 mg total) by mouth every 6 (six) hours as needed for severe pain. 28 tablet 0   potassium chloride (KLOR-CON M) 10 MEQ tablet Take 10 mEq by mouth every other day. In the morning     rosuvastatin (CRESTOR) 10 MG tablet Take 10 mg by mouth every Monday, Wednesday, and Friday. In the morning     salmeterol (SEREVENT DISKUS) 50 MCG/ACT diskus inhaler Inhale 1 puff into the lungs 2 (two) times daily. 1 each 4   telmisartan (MICARDIS) 40 MG tablet Take 40 mg by mouth at bedtime.      traZODone (DESYREL) 50 MG tablet Take 50 mg by mouth at bedtime.     zolpidem (AMBIEN) 10 MG tablet Take 10 mg by mouth at bedtime as needed for sleep.     No current facility-administered medications for this visit.    Physical Exam BP (!) 151/85 (BP Location: Right Arm, Patient Position: Sitting)   Pulse 73   Resp 18   Ht 5' (1.524 m)   Wt 137 lb (62.1 kg)   SpO2 94% Comment: RA  BMI 26.76 kg/m  62 year old woman in no acute distress Alert and oriented x 3 with no focal deficits Lungs clear Incisions healing well  Diagnostic Tests: None  Impression: Emily Santiago is a 62 year old woman with history of stage Ia adenocarcinoma of the lung, tobacco abuse, COPD, COVID-19, aortic atherosclerosis, stage III chronic kidney disease, lymphoma, reflux, Barrett's esophagus, fibromyalgia, and osteoporosis.  She is now about a month out from a right lower lobe superior segmentectomy.  Still has some intercostal neuralgia pain but is significantly better than it was when I saw her 2 weeks ago.  Recommend she continue gabapentin 3 times daily as a scheduled medication and also take Tylenol on a regular basis.  She can then use ibuprofen and/or oxycodone when the pain is severe.  I told her to expect this to take several months to resolve.  She wants to return to work and says she can do so with some activity limitations.  I told her that she can return on a limited basis beginning March 4 but no lifting over 10 pounds.   Plan: May return to work on a limited basis on Oct 16, 2022 Return in 1 month with PA and lateral chest x-ray to check on progress  Melrose Nakayama, MD [facility] [phone]

## 2022-08-03 NOTE — Telephone Encounter (Signed)
Per Dr. Roxan Hockey, request for oxygen and hospital bed pick up faxed to [facility] at [phone].

## 2022-08-20 ENCOUNTER — Telehealth: Payer: Self-pay | Admitting: Pulmonary Disease

## 2022-08-20 MED ORDER — STRIVERDI RESPIMAT 2.5 MCG/ACT IN AERS: 2.0000 | INHALATION_SPRAY | Freq: Every day | RESPIRATORY_TRACT | 2 refills | Status: DC

## 2022-08-20 NOTE — Telephone Encounter (Signed)
ATC and line was busy. Will try again

## 2022-08-20 NOTE — Telephone Encounter (Signed)
Pt called in bc she can't take the powder kind of inhaler

## 2022-08-20 NOTE — Telephone Encounter (Signed)
Called and spoke to patient and she states that it is the Serevent Diskus that is causing her to have issues due to the dry powder

## 2022-08-20 NOTE — Telephone Encounter (Signed)
Which inhaler is it? The Serevent?

## 2022-08-20 NOTE — Telephone Encounter (Signed)
The only other option, other than nebulizer therapy, he is Striverdi.  2 puffs once a day.  This is a misted medication, not a dry powder inhaler.  I recommend she search on YouTube "how to use a Respimat" for tutorial on how to use this inhaler since it is different than prior inhalers.

## 2022-08-20 NOTE — Telephone Encounter (Signed)
Dr Silas Flood  She states she can not handel the powder inhaler.   Do you have any other recommendations  Thank you

## 2022-08-24 NOTE — Telephone Encounter (Signed)
Spoke with pt and notified her of the change to [facility]. Inhaler education was reviewed with pt and pt stated understanding. Pt verified pharmacy as [facility] in [place]. Orders placed. Nothing further needed at this time.

## 2022-08-24 NOTE — Telephone Encounter (Signed)
Patient is returning phone call. Patient phone number is [phone].

## 2022-08-30 ENCOUNTER — Other Ambulatory Visit: Payer: Self-pay | Admitting: Thoracic Surgery (Cardiothoracic Vascular Surgery)

## 2022-08-30 DIAGNOSIS — C3490 Malignant neoplasm of unspecified part of unspecified bronchus or lung: Secondary | ICD-10-CM

## 2022-08-31 ENCOUNTER — Ambulatory Visit
Admission: RE | Admit: 2022-08-31 | Discharge: 2022-08-31 | Disposition: A | Discharge status: Home / Self Care | Payer: Commercial Managed Care - HMO | Source: Ambulatory Visit | Attending: Thoracic Surgery (Cardiothoracic Vascular Surgery) | Admitting: Thoracic Surgery (Cardiothoracic Vascular Surgery)

## 2022-08-31 ENCOUNTER — Other Ambulatory Visit: Payer: Self-pay | Admitting: Physician Assistant

## 2022-08-31 ENCOUNTER — Encounter: Payer: Self-pay | Admitting: Physician Assistant

## 2022-08-31 ENCOUNTER — Ambulatory Visit (INDEPENDENT_AMBULATORY_CARE_PROVIDER_SITE_OTHER): Payer: Self-pay | Admitting: Physician Assistant

## 2022-08-31 VITALS — BP 171/97 | HR 73 | Resp 20 | Ht 60.0 in | Wt 133.0 lb

## 2022-08-31 DIAGNOSIS — C3490 Malignant neoplasm of unspecified part of unspecified bronchus or lung: Secondary | ICD-10-CM

## 2022-08-31 DIAGNOSIS — Z9889 Other specified postprocedural states: Secondary | ICD-10-CM

## 2022-08-31 MED ORDER — OXYCODONE HCL 5 MG PO TABS: 5.0000 mg | ORAL_TABLET | Freq: Four times a day (QID) | ORAL | 0 refills | Status: DC | PRN

## 2022-08-31 MED ORDER — OXYCODONE HCL 5 MG PO TABS: 5.0000 mg | ORAL_TABLET | Freq: Four times a day (QID) | ORAL | Status: DC | PRN

## 2022-08-31 NOTE — Progress Notes (Signed)
[address]       [place] [address]             [phone]     HPI: Emily Santiago returns for routine postoperative follow-up having undergone robotic assisted right lower lobe superior segmentectomy on 07/02/22.  She was discharged on 07/02/22 with home oxygen. She was last seen in the office by Dr. Roxan Hockey on 08/03/22. She had some complaints of intercostal neuralgia that had been improving at the last visit. She had been taking Oxycodone intermittently as needed, Gabapentin 300mg  TID as well as Tylenol and Ibuprofen as needed.   Today she reports that she still requires Oxycodone once daily for pain especially at night. She began working on March 4th. She states ibuprofen flares up her acid reflux and Tylenol doesn't touch the pain. She reports improving shortness of breath when walking. No chest pain or palpitations. Some bilateral feet swelling at the end of the day.    Current Outpatient Medications  Medication Sig Dispense Refill   amLODipine (NORVASC) 5 MG tablet Take 5 mg by mouth in the morning.     buPROPion (WELLBUTRIN SR) 150 MG 12 hr tablet Take 150 mg by mouth in the morning.     Cholecalciferol (DIALYVITE VITAMIN D 5000) 125 MCG (5000 UT) capsule Take 5,000 Units by mouth at bedtime.     Cyanocobalamin (VITAMIN B-12 PO) Take 1 tablet by mouth in the morning.     diclofenac (VOLTAREN) 50 MG EC tablet Take 50 mg by mouth in the morning.     esomeprazole (NEXIUM) 40 MG capsule Take 40 mg by mouth daily before breakfast.     fluticasone (FLONASE) 50 MCG/ACT nasal spray Place 1 spray into both nostrils daily as needed for allergies or rhinitis.     gabapentin (NEURONTIN) 300 MG capsule Take 1 capsule (300 mg total) by mouth 3 (three) times daily. In the morning and at bedtime 90 capsule 1   LORazepam (ATIVAN) 0.5 MG tablet Take 0.25 mg by mouth 2 (two) times daily as needed for anxiety.     Multiple Vitamin (MULTIVITAMIN WITH MINERALS) TABS tablet Take 1  tablet by mouth in the morning.     Olodaterol HCl (STRIVERDI RESPIMAT) 2.5 MCG/ACT AERS Inhale 2 puffs into the lungs daily. 1 each 2   oxyCODONE 10 MG TABS Take 1 tablet (10 mg total) by mouth every 6 (six) hours as needed for severe pain. 28 tablet 0   potassium chloride (KLOR-CON M) 10 MEQ tablet Take 10 mEq by mouth every other day. In the morning     rosuvastatin (CRESTOR) 10 MG tablet Take 10 mg by mouth every Monday, Wednesday, and Friday. In the morning     telmisartan (MICARDIS) 40 MG tablet Take 40 mg by mouth at bedtime.     traZODone (DESYREL) 50 MG tablet Take 50 mg by mouth at bedtime.     zolpidem (AMBIEN) 10 MG tablet Take 10 mg by mouth at bedtime as needed for sleep.     No current facility-administered medications for this visit.   Vitals: Today's Vitals   08/31/22 0937  BP: (!) 171/97  Pulse: 73  Resp: 20  SpO2: 96%  Weight: 133 lb (60.3 kg)  Height: 5' (1.524 m)   Body mass index is 25.97 kg/m.   Physical Exam: General: No acute distress Neuro: Alert and oriented, grossly intact CV: Regular rate and rhythm, no murmur Pulm: Clear to auscultation Extremities: No edema  Diagnostic Tests: CLINICAL DATA:  lung cancer   EXAM: CHEST - 2 VIEW   COMPARISON:  Chest x-ray July 20, 2022.   FINDINGS: Similar postsurgical changes in the inferior right lung including volume loss, sutures and lateral pleural fluid. No new consolidation. Cardiomediastinal silhouette is unchanged. No acute osseous abnormality. Cholecystectomy clips.   IMPRESSION: Similar postsurgical changes in the inferior right lung including volume loss, sutures and lateral pleural fluid. No significant change.     Electronically Signed   By: Margaretha Sheffield M.D.   On: 08/31/2022 08:50  Impression: Emily Santiago continues to progress from robotic assisted right lower lobe superior segmentectomy. She states her pain has remained the same since the last visit and still requires 10mg   Oxycodone once per day as well as Gabapentin 300mg  TID. She does report that the Gabapentin makes her drowsy and she no longer takes Ibuprofen due to reflux or Tylenol since it does not help her pain. She started working light duty at the beginning of this month. Discussed continuing light duty work and no lifting greater than 10 lbs to allow continued healing. Will decrease Oxycodone to 5mg  Q6H PRN and decrease Gabapentin to 300mg  BID then 300mg  daily if tolerated. Continue taking Tylenol as needed to assist with pain control.   Discussed follow up with PCP for blood pressure control. She states she has an appointment in April.   Pt has seen Dr. Julien Nordmann who recommended no adjuvant therapy.  Plan to follow up with Dr. Roxan Hockey in 1 month.  Magdalene River, PA-C [facility] [phone]

## 2022-08-31 NOTE — Patient Instructions (Signed)
Decrease Oxycodone 5mg  every 6 hours as needed  Decrease Gabapentin 300mg  twice per day, then once per day if tolerated  Continue light duty work, no lifting greater than 10lbs  You can drive as long as you are not taking pain medications

## 2022-09-15 ENCOUNTER — Ambulatory Visit (HOSPITAL_COMMUNITY): Payer: Commercial Managed Care - HMO

## 2022-09-21 ENCOUNTER — Ambulatory Visit (INDEPENDENT_AMBULATORY_CARE_PROVIDER_SITE_OTHER): Payer: Self-pay | Admitting: Thoracic Surgery (Cardiothoracic Vascular Surgery)

## 2022-09-21 VITALS — BP 178/106 | HR 75 | Resp 18 | Ht 60.0 in | Wt 135.0 lb

## 2022-09-21 DIAGNOSIS — Z9889 Other specified postprocedural states: Secondary | ICD-10-CM

## 2022-09-21 NOTE — Progress Notes (Signed)
[address]       [place] [address]             [phone]     HPI: [name] returns for a scheduled follow-up after recent right lower lobe superior segmentectomy.  Emily Santiago is a 62 year old woman with a history of tobacco abuse, COPD, COVID-19, aortic atherosclerosis, stage III chronic kidney disease, lymphoma, reflux, Barrett's esophagus, fibromyalgia, osteoporosis, and a stage Ia adenocarcinoma of the right lower lobe.  She had a right lower lobe superior segmentectomy on 07/02/2022.  Postoperative course was uncomplicated but about 3 weeks postop she was having a lot of intercostal neuralgia.  We increased her gabapentin and she continues to take oxycodone as well.  Cannot really use ibuprofen due to stomach upset.  She was last in the office on 08/31/2022 by Aloha Gell.  Her oxycodone dose was decreased to 5 mg.  Her gabapentin was decreased to twice daily.  She says that her boyfriend tells her that the gabapentin makes her like a "different person."  Taking oxycodone occasionally but does not need it every night.  No respiratory issues.  Past Medical History:  Diagnosis Date   ADHD (attention deficit hyperactivity disorder)    Allergies    Anxiety    Aortic atherosclerosis (HCC)    Arthritis    WRIST AND BACK   Asthma    Atrophic vaginitis    CKD (chronic kidney disease), stage II    Complication of anesthesia    slow to wake up   COPD (chronic obstructive pulmonary disease) (HCC)    pt denies this dx   COVID 2022   states it was a bad case, not hospitalized   COVID-19    Depression    Diverticulosis    Dyspnea    Emphysema/COPD (HCC)    Family history of adverse reaction to anesthesia    mother was hard to wake up, acted weird afterwards   Fibromyalgia    Gastric ulcer    GERD (gastroesophageal reflux disease)    History of Barrett's esophagus    History of head and neck cancer    History of kidney stones    Hx of colonic polyps     Hyperlipidemia    Hypertension    Insomnia    Lymphoma (HCC)    Lymphoma   Melanosis coli    Osteopenia    Osteoporosis    Plantar fasciitis    Tobacco abuse    Tubular adenoma of colon    Vallecular mass    Varicose veins    Vitamin D deficiency     Current Outpatient Medications  Medication Sig Dispense Refill   amLODipine (NORVASC) 5 MG tablet Take 5 mg by mouth in the morning.     buPROPion (WELLBUTRIN SR) 150 MG 12 hr tablet Take 150 mg by mouth in the morning.     Cholecalciferol (DIALYVITE VITAMIN D 5000) 125 MCG (5000 UT) capsule Take 5,000 Units by mouth at bedtime.     Cyanocobalamin (VITAMIN B-12 PO) Take 1 tablet by mouth in the morning.     diclofenac (VOLTAREN) 50 MG EC tablet Take 50 mg by mouth in the morning.     esomeprazole (NEXIUM) 40 MG capsule Take 40 mg by mouth daily before breakfast.     fluticasone (FLONASE) 50 MCG/ACT nasal spray Place 1 spray into both nostrils daily as needed for allergies or rhinitis.     gabapentin (NEURONTIN) 300 MG  capsule Take 1 capsule (300 mg total) by mouth 3 (three) times daily. In the morning and at bedtime 90 capsule 1   LORazepam (ATIVAN) 0.5 MG tablet Take 0.25 mg by mouth 2 (two) times daily as needed for anxiety.     Multiple Vitamin (MULTIVITAMIN WITH MINERALS) TABS tablet Take 1 tablet by mouth in the morning.     Olodaterol HCl (STRIVERDI RESPIMAT) 2.5 MCG/ACT AERS Inhale 2 puffs into the lungs daily. 1 each 2   oxyCODONE (ROXICODONE) 5 MG immediate release tablet Take 1 tablet (5 mg total) by mouth every 6 (six) hours as needed. 28 tablet 0   potassium chloride (KLOR-CON M) 10 MEQ tablet Take 10 mEq by mouth every other day. In the morning     rosuvastatin (CRESTOR) 10 MG tablet Take 10 mg by mouth every Monday, Wednesday, and Friday. In the morning     telmisartan (MICARDIS) 40 MG tablet Take 40 mg by mouth at bedtime.     traZODone (DESYREL) 50 MG tablet Take 50 mg by mouth at bedtime.     zolpidem (AMBIEN) 10 MG  tablet Take 10 mg by mouth at bedtime as needed for sleep.     No current facility-administered medications for this visit.    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  Status post right lower lobe superior segmentectomy-intercostal neuralgia pain continues to gradually improve.  Has not resolved completely and she still takes oxycodone occasionally but is not having to take it every night.  Would like to come off gabapentin.  I told her to decrease it to 300 mg nightly for 2 weeks.  If she does not notice any worsening of her pain then she can stop it altogether.  Stage Ia adenocarcinoma-saw Dr. Arbutus Ped.  No adjuvant therapy.  Scheduled for follow-up CT in September.  Hypertension-blood pressure markedly elevated today.  She has an appointment with her primary care later this month.  Can be addressed at that time.  Plan: Follow-up with primary care regarding blood pressure Gabapentin taper as noted. I will see her back in September after she has her CT with Dr. Shirline Frees.  If she needs [place] in the meantime she will call to schedule.  Loreli Slot, MD [facility] [phone]

## 2022-10-05 ENCOUNTER — Ambulatory Visit: Payer: Commercial Managed Care - HMO | Admitting: Thoracic Surgery (Cardiothoracic Vascular Surgery)

## 2022-12-15 ENCOUNTER — Other Ambulatory Visit: Payer: Self-pay | Admitting: Family Medicine

## 2022-12-15 DIAGNOSIS — Z1231 Encounter for screening mammogram for malignant neoplasm of breast: Secondary | ICD-10-CM

## 2022-12-22 ENCOUNTER — Ambulatory Visit
Admission: RE | Admit: 2022-12-22 | Discharge: 2022-12-22 | Disposition: A | Discharge status: Home / Self Care | Payer: BC Managed Care – PPO | Source: Ambulatory Visit | Attending: Family Medicine | Admitting: Family Medicine

## 2022-12-22 DIAGNOSIS — Z1231 Encounter for screening mammogram for malignant neoplasm of breast: Secondary | ICD-10-CM

## 2022-12-24 DIAGNOSIS — J019 Acute sinusitis, unspecified: Secondary | ICD-10-CM | POA: Diagnosis not present

## 2022-12-24 DIAGNOSIS — Z03818 Encounter for observation for suspected exposure to other biological agents ruled out: Secondary | ICD-10-CM | POA: Diagnosis not present

## 2023-01-12 ENCOUNTER — Telehealth: Payer: Self-pay | Admitting: Internal Medicine

## 2023-01-12 NOTE — Telephone Encounter (Signed)
Called patient regarding upcoming August appointments, patient is notified. 

## 2023-01-14 ENCOUNTER — Other Ambulatory Visit: Payer: Self-pay | Admitting: Pulmonary Disease

## 2023-01-19 ENCOUNTER — Encounter: Payer: Self-pay | Admitting: Pulmonary Disease

## 2023-01-19 ENCOUNTER — Ambulatory Visit: Payer: BC Managed Care – PPO | Admitting: Pulmonary Disease

## 2023-01-19 VITALS — BP 118/64 | HR 82 | Ht 61.0 in | Wt 148.0 lb

## 2023-01-19 DIAGNOSIS — C3490 Malignant neoplasm of unspecified part of unspecified bronchus or lung: Secondary | ICD-10-CM

## 2023-01-19 MED ORDER — STRIVERDI RESPIMAT 2.5 MCG/ACT IN AERS: INHALATION_SPRAY | RESPIRATORY_TRACT | 3 refills | Status: DC

## 2023-01-19 NOTE — Addendum Note (Signed)
Addended byVilma Meckel on: 01/19/2023 01:49 PM   Modules accepted: Level of Service

## 2023-01-19 NOTE — Patient Instructions (Signed)
Like to see you again  I refilled the inhaler and sent it via the mail order pharmacy, they should send a 75-month supply at a time hopefully this will avoid times where the you are out of it and the pharmacy cannot get it for you fast enough  No other changes to medications  Return to clinic in 6 months or sooner as needed with Dr. Judeth Horn

## 2023-01-19 NOTE — Progress Notes (Signed)
Patient ID: Emily Santiago, female    DOB: [birth year], 62 y.o.   MRN: [id number]  Chief Complaint  Patient presents with   Follow-up    Breathing is good  Slight cough,SOB     Referring provider: Clovis Riley, L.August Saucer, MD  HPI:   [name] is a 62 y.o. woman with PMH tobacco abuse, emphysema, HTN whom we are seeing in follow up of cough, DOE, lung nodule with diagnosis of adenocarcinoma following robotic navigational bronchoscopy 05/03/2022 s/p VATS 07/02/2022.  Recent cardiothoracic surgery office note reviewed.  Overall doing well.  Good appearance to Striverdi.  Things to help some.  Maybe occasional headache with this.  But overall better and seems to be a bit better with that.  Has a bit of residual postthoracotomy pain.  Discussed hopefully this will improve with time as the nerves heal.  HPI at initial visit: History obtained per patient and mother at bedside who frequently interjects.  Patient notes cough is actually been present for many years.  She thinks cough is not worsened over the last 6 months or so.  In the past, she attributed to her smoking.  She reportedly has had many episodes of bronchitis. She has been told she has COPD based on her symptoms and smoking. Never had PFTs. Requires prednisone everythime she has bronchitis. At least once a year.  2 courses this calendar year already to date.   Cough is daily. Worse when eating and drinking. Gets choked on water. Endorses sensation of solids getting stuck in throat. Cough worse when lying supine. Cough is non-productive. Endorses occasional heartburn despite PPI and H2 blocker. Endorses refractory reflux. Reports she has a hiatal hernia. Had curse of abx with no improvement. Uses Breo and thinks helps cough mildly and gives a little more breath.  She is an every day smoker. 2 ppd for about 40 years. Interested in quitting but seems more pre-contemplative at the moment.   Prior imaging reviewed and interpreted as: CXR  10/2019 - hyperinflated, no infiltrate; CT chest 03/2018 - emphysema, clear lungs.   PMH: HTN, GERD, emphysema Surgical history: Endoscopy, hysterectomy, cholecystectomy Family History: CAD, COPD in father, COPD in brother Social History: every day smoker, lives in [employer]  Questionaires / Pulmonary Flowsheets:   ACT:      No data to display          MMRC:     No data to display          Epworth:      No data to display          Tests:   FENO:  No results found for: "NITRICOXIDE"  PFT:    Latest Ref Rng & Units 06/29/2022   10:29 AM 03/26/2020    2:56 PM  PFT Results  FVC-Pre L 2.37  P 2.21   FVC-Predicted Pre % 85  P 74   FVC-Post L 2.54  P 2.49   FVC-Predicted Post % 91  P 84   Pre FEV1/FVC % % 69  P 70   Post FEV1/FCV % % 71  P 72   FEV1-Pre L 1.64  P 1.55   FEV1-Predicted Pre % 77  P 67   FEV1-Post L 1.79  P 1.78   DLCO uncorrected ml/min/mmHg 15.14  P 14.63   DLCO UNC% % 86  P 80   DLCO corrected ml/min/mmHg  14.63   DLCO COR %Predicted %  80   DLVA Predicted % 89  P  81   TLC L 4.53  P 4.73   TLC % Predicted % 101  P 102   RV % Predicted % 114  P 136     P Preliminary result  Personally reviewed and interpreted as spirometry digestive restriction versus air trapping, significant bronchodilator response after all.  Lung volumes within normal limits with the exception of evidence of air trapping.  DLCO within normal limits.  WALK:      No data to display          Imaging: Personally reviewed and as per EMR discussion this note MM 3D SCREENING MAMMOGRAM BILATERAL BREAST  Result Date: 12/23/2022 CLINICAL DATA:  Screening. EXAM: DIGITAL SCREENING BILATERAL MAMMOGRAM WITH TOMOSYNTHESIS AND CAD TECHNIQUE: Bilateral screening digital craniocaudal and mediolateral oblique mammograms were obtained. Bilateral screening digital breast tomosynthesis was performed. The images were evaluated with computer-aided detection. COMPARISON:  Previous  exam(s). ACR Breast Density Category b: There are scattered areas of fibroglandular density. FINDINGS: There are no findings suspicious for malignancy. IMPRESSION: No mammographic evidence of malignancy. A result letter of this screening mammogram will be mailed directly to the patient. RECOMMENDATION: Screening mammogram in one year. (Code:SM-B-01Y) BI-RADS CATEGORY  1: Negative. Electronically Signed   By: Norva Pavlov M.D.   On: 12/23/2022 12:54    Lab Results: Personally reviewed CBC    Component Value Date/Time   WBC 6.6 07/29/2022 1119   RBC 4.21 07/29/2022 1119   HGB 12.8 07/29/2022 1119   HGB 13.4 04/22/2017 1407   HCT 38.1 07/29/2022 1119   HCT 40.5 04/22/2017 1407   PLT 211 07/29/2022 1119   PLT 219 04/22/2017 1407   MCV 90.5 07/29/2022 1119   MCV 95.1 04/22/2017 1407   MCH 30.4 07/29/2022 1119   MCHC 33.6 07/29/2022 1119   RDW 12.7 07/29/2022 1119   RDW 13.4 04/22/2017 1407   LYMPHSABS 1.3 07/29/2022 1119   LYMPHSABS 1.7 04/22/2017 1407   MONOABS 0.5 07/29/2022 1119   MONOABS 0.4 04/22/2017 1407   EOSABS 0.2 07/29/2022 1119   EOSABS 0.1 04/22/2017 1407   BASOSABS 0.0 07/29/2022 1119   BASOSABS 0.0 04/22/2017 1407    BMET    Component Value Date/Time   NA 139 07/29/2022 1119   NA 137 04/22/2017 1407   K 4.2 07/29/2022 1119   K 5.0 04/22/2017 1407   CL 102 07/29/2022 1119   CO2 29 07/29/2022 1119   CO2 24 04/22/2017 1407   GLUCOSE 74 07/29/2022 1119   GLUCOSE 77 04/22/2017 1407   BUN 18 07/29/2022 1119   BUN 21.6 04/22/2017 1407   CREATININE 0.70 07/29/2022 1119   CREATININE 0.8 04/22/2017 1407   CALCIUM 9.5 07/29/2022 1119   CALCIUM 9.5 04/22/2017 1407   GFRNONAA >60 07/29/2022 1119   GFRAA >60 12/31/2019 0938    BNP    Component Value Date/Time   BNP 16.5 02/18/2017 1659    ProBNP No results found for: "PROBNP"  Specialty Problems       Pulmonary Problems   Sore throat   Lung nodule   Right lower lobe pulmonary nodule    Allergies   Allergen Reactions   Hydrocodone-Acetaminophen Anaphylaxis    Throat/tongue swells   Penicillins Nausea Only and Rash    Stomach upset Has patient had a PCN reaction causing immediate rash, facial/tongue/throat swelling, SOB or lightheadedness with hypotension: No Has patient had a PCN reaction causing severe rash involving mucus membranes or skin necrosis: Yes Has patient had a PCN reaction that required  hospitalization: NO Has patient had a PCN reaction occurring within the last 10 years: Yes If all of the above answers are "NO", then may proceed with Cephalosporin use.     Zestril [Lisinopril] Cough   Chlorthalidone     Other Reaction(s): hyponatremia   Latex Itching    Immunization History  Administered Date(s) Administered   Influenza Split 02/13/2020   Influenza,inj,Quad PF,6+ Mos 06/01/2016, 03/15/2018, 04/15/2021   Influenza,inj,quad, With Preservative 05/17/2013   Moderna Sars-Covid-2 Vaccination 01/14/2020, 02/13/2020, 11/10/2020   PNEUMOCOCCAL CONJUGATE-20 05/20/2021   Pfizer Covid-19 Vaccine Bivalent Booster 38yrs & up 05/20/2021   Zoster, Live 10/14/2021    Past Medical History:  Diagnosis Date   ADHD (attention deficit hyperactivity disorder)    Allergies    Anxiety    Aortic atherosclerosis (HCC)    Arthritis    WRIST AND BACK   Asthma    Atrophic vaginitis    CKD (chronic kidney disease), stage II    Complication of anesthesia    slow to wake up   COPD (chronic obstructive pulmonary disease) (HCC)    pt denies this dx   COVID 2022   states it was a bad case, not hospitalized   COVID-19    Depression    Diverticulosis    Dyspnea    Emphysema/COPD (HCC)    Family history of adverse reaction to anesthesia    mother was hard to wake up, acted weird afterwards   Fibromyalgia    Gastric ulcer    GERD (gastroesophageal reflux disease)    History of Barrett's esophagus    History of head and neck cancer    History of kidney stones    Hx of colonic  polyps    Hyperlipidemia    Hypertension    Insomnia    Lymphoma (HCC)    Lymphoma   Melanosis coli    Osteopenia    Osteoporosis    Plantar fasciitis    Tobacco abuse    Tubular adenoma of colon    Vallecular mass    Varicose veins    Vitamin D deficiency     Tobacco History: Social History   Tobacco Use  Smoking Status Former   Current packs/day: 0.00   Types: Cigarettes   Start date: 26   Quit date: 2022   Years since quitting: 2.6  Smokeless Tobacco Never   Counseling given: Not Answered   Continue to not smoke  Outpatient Encounter Medications as of 01/19/2023  Medication Sig   amLODipine (NORVASC) 5 MG tablet Take 5 mg by mouth in the morning.   Cholecalciferol (DIALYVITE VITAMIN D 5000) 125 MCG (5000 UT) capsule Take 5,000 Units by mouth at bedtime.   Cyanocobalamin (VITAMIN B-12 PO) Take 1 tablet by mouth in the morning.   diclofenac (VOLTAREN) 50 MG EC tablet Take 50 mg by mouth in the morning.   esomeprazole (NEXIUM) 40 MG capsule Take 40 mg by mouth daily before breakfast.   fluticasone (FLONASE) 50 MCG/ACT nasal spray Place 1 spray into both nostrils daily as needed for allergies or rhinitis.   gabapentin (NEURONTIN) 300 MG capsule Take 1 capsule (300 mg total) by mouth 3 (three) times daily. In the morning and at bedtime   LORazepam (ATIVAN) 0.5 MG tablet Take 0.25 mg by mouth 2 (two) times daily as needed for anxiety.   Multiple Vitamin (MULTIVITAMIN WITH MINERALS) TABS tablet Take 1 tablet by mouth in the morning.   potassium chloride (KLOR-CON M) 10 MEQ tablet Take 10  mEq by mouth every other day. In the morning   rosuvastatin (CRESTOR) 10 MG tablet Take 10 mg by mouth every Monday, Wednesday, and Friday. In the morning   telmisartan (MICARDIS) 40 MG tablet Take 40 mg by mouth at bedtime.   traZODone (DESYREL) 50 MG tablet Take 50 mg by mouth at bedtime.   zolpidem (AMBIEN) 10 MG tablet Take 10 mg by mouth at bedtime as needed for sleep.    [DISCONTINUED] Olodaterol HCl (STRIVERDI RESPIMAT) 2.5 MCG/ACT AERS INHALE 2 PUFFS BY MOUTH INTO THE LUNGS DAILY   [DISCONTINUED] oxyCODONE (ROXICODONE) 5 MG immediate release tablet Take 1 tablet (5 mg total) by mouth every 6 (six) hours as needed.   buPROPion (WELLBUTRIN SR) 150 MG 12 hr tablet Take 150 mg by mouth in the morning. (Patient not taking: Reported on 01/19/2023)   Olodaterol HCl (STRIVERDI RESPIMAT) 2.5 MCG/ACT AERS INHALE 2 PUFFS BY MOUTH INTO THE LUNGS DAILY   No facility-administered encounter medications on file as of 01/19/2023.     Review of Systems  Review of Systems  N/a  Physical Exam  BP 118/64 (BP Location: Left Arm, Patient Position: Sitting, Cuff Size: Normal)   Pulse 82   Ht 5\' 1"  (1.549 m)   Wt 148 lb (67.1 kg)   SpO2 97%   BMI 27.96 kg/m   Wt Readings from Last 5 Encounters:  01/19/23 148 lb (67.1 kg)  09/21/22 135 lb (61.2 kg)  08/31/22 133 lb (60.3 kg)  08/03/22 137 lb (62.1 kg)  07/29/22 136 lb 14.4 oz (62.1 kg)    BMI Readings from Last 5 Encounters:  01/19/23 27.96 kg/m  09/21/22 26.37 kg/m  08/31/22 25.97 kg/m  08/03/22 26.76 kg/m  07/29/22 26.74 kg/m     Physical Exam General: Well appearing, in NAD Eyes: EOMI, no icterus ENT: No JVP appreciated, voice is mildly hoarse Respiratory: Distant sounds, no wheeze CV: RRR, no murmurs Abdomen/GI: soft, BS present MSK: No synovitis, no joint effusions Neuro: Normal gait, no weakness Psych: Normal mood, flat affect   Assessment & Plan:   Chronic Cough: Suspect multifactorial. Suspect largest contributors are refractory GERD and aspiration with dysphagia as well as hiatal hernia contributing to refractory reflux.  PFTs also consistent with possible asthma with significant bronchodilator response.  Overall seems improved.    Asthma: Based on bronchodilator response on PFT as well as air trapping.  Mild emphysema also present.  Did not tolerate Breo. Reported headache with Anoro and  Incruse, possible LAMA side effect.  However I think these headaches are chronic and not related.  Had intolerable side effects to Serevent.  Only real LABA therapy left is Striverdi -refilled today.  Fortunately she is tolerating this relatively well with maybe some mild headaches occasionally.  Right lower lobe lung nodule, adenocarcinoma of lung: Status post VATS 06/2022.  Encouraged ongoing oncology follow-up.  Return in about 6 months (around 07/22/2023) for f/u Dr. Judeth Horn.   Karren Burly, MD 01/19/2023

## 2023-01-25 ENCOUNTER — Inpatient Hospital Stay: Payer: BC Managed Care – PPO | Attending: Internal Medicine

## 2023-01-25 ENCOUNTER — Ambulatory Visit (HOSPITAL_COMMUNITY)
Admission: RE | Admit: 2023-01-25 | Discharge: 2023-01-25 | Disposition: A | Discharge status: Home / Self Care | Payer: BC Managed Care – PPO | Source: Ambulatory Visit | Attending: Internal Medicine | Admitting: Internal Medicine

## 2023-01-25 ENCOUNTER — Other Ambulatory Visit: Payer: Self-pay

## 2023-01-25 DIAGNOSIS — Z87891 Personal history of nicotine dependence: Secondary | ICD-10-CM | POA: Diagnosis not present

## 2023-01-25 DIAGNOSIS — Z79899 Other long term (current) drug therapy: Secondary | ICD-10-CM | POA: Insufficient documentation

## 2023-01-25 DIAGNOSIS — Z85118 Personal history of other malignant neoplasm of bronchus and lung: Secondary | ICD-10-CM | POA: Diagnosis not present

## 2023-01-25 DIAGNOSIS — C3490 Malignant neoplasm of unspecified part of unspecified bronchus or lung: Secondary | ICD-10-CM | POA: Insufficient documentation

## 2023-01-25 DIAGNOSIS — Z801 Family history of malignant neoplasm of trachea, bronchus and lung: Secondary | ICD-10-CM | POA: Insufficient documentation

## 2023-01-25 DIAGNOSIS — R918 Other nonspecific abnormal finding of lung field: Secondary | ICD-10-CM | POA: Diagnosis not present

## 2023-01-25 DIAGNOSIS — I700 Atherosclerosis of aorta: Secondary | ICD-10-CM | POA: Diagnosis not present

## 2023-01-25 DIAGNOSIS — J439 Emphysema, unspecified: Secondary | ICD-10-CM | POA: Diagnosis not present

## 2023-01-25 LAB — CMP (CANCER CENTER ONLY)
ALT: 12 U/L (ref 0–44)
AST: 13 U/L — ABNORMAL LOW (ref 15–41)
Albumin: 4.2 g/dL (ref 3.5–5.0)
Alkaline Phosphatase: 87 U/L (ref 38–126)
Anion gap: 6 (ref 5–15)
BUN: 17 mg/dL (ref 8–23)
CO2: 30 mmol/L (ref 22–32)
Calcium: 9.3 mg/dL (ref 8.9–10.3)
Chloride: 105 mmol/L (ref 98–111)
Creatinine: 0.61 mg/dL (ref 0.44–1.00)
GFR, Estimated: >60 mL/min (ref >60)
Glucose, Bld: 65 mg/dL — ABNORMAL LOW (ref 70–99)
Potassium: 4.3 mmol/L (ref 3.5–5.1)
Sodium: 141 mmol/L (ref 135–145)
Total Bilirubin: 0.5 mg/dL (ref 0.3–1.2)
Total Protein: 6.8 g/dL (ref 6.5–8.1)

## 2023-01-25 LAB — CBC WITH DIFFERENTIAL (CANCER CENTER ONLY)
Abs Immature Granulocytes: 0.02 10*3/uL (ref 0.00–0.07)
Basophils Absolute: 0 10*3/uL (ref 0.0–0.1)
Basophils Relative: 0 %
Eosinophils Absolute: 0.2 10*3/uL (ref 0.0–0.5)
Eosinophils Relative: 3 %
HCT: 40.2 % (ref 36.0–46.0)
Hemoglobin: 13.2 g/dL (ref 12.0–15.0)
Immature Granulocytes: 0 %
Lymphocytes Relative: 18 %
Lymphs Abs: 1.4 10*3/uL (ref 0.7–4.0)
MCH: 30.6 pg (ref 26.0–34.0)
MCHC: 32.8 g/dL (ref 30.0–36.0)
MCV: 93.1 fL (ref 80.0–100.0)
Monocytes Absolute: 0.6 10*3/uL (ref 0.1–1.0)
Monocytes Relative: 8 %
Neutro Abs: 5.4 10*3/uL (ref 1.7–7.7)
Neutrophils Relative %: 71 %
Platelet Count: 263 10*3/uL (ref 150–400)
RBC: 4.32 MIL/uL (ref 3.87–5.11)
RDW: 13 % (ref 11.5–15.5)
WBC Count: 7.7 10*3/uL (ref 4.0–10.5)
nRBC: 0 % (ref 0.0–0.2)

## 2023-01-25 MED ORDER — IOHEXOL 300 MG/ML  SOLN
75.0000 mL | Freq: Once | INTRAMUSCULAR | Status: AC | PRN
  Administered 2023-01-25: 75 mL via INTRAVENOUS

## 2023-01-26 ENCOUNTER — Other Ambulatory Visit: Payer: Self-pay | Admitting: Thoracic Surgery (Cardiothoracic Vascular Surgery)

## 2023-01-27 ENCOUNTER — Inpatient Hospital Stay: Payer: BC Managed Care – PPO | Admitting: Internal Medicine

## 2023-01-29 NOTE — Progress Notes (Unsigned)
[facility] OFFICE PROGRESS NOTE  Irven Coe, MD [address] [place] [address]  DIAGNOSIS: stage Ia (T1 a, N0, M0) non-small cell lung cancer, adenocarcinoma presented with right lower lobe lung nodule.  with tumor size of 0.8 cm and no evidence for visceral pleural or lymphovascular invasion and the dissected lymph nodes were negative for malignancy.   PRIOR THERAPY: status post right lower lobe superior segmentectomy under the care of Dr. Dorris Fetch on July 02, 2022.   CURRENT THERAPY: Observation   INTERVAL HISTORY: Emily Santiago 62 y.o. female returns to the clinic for a follow up visit. The patient is currently on observation for her history of stage Ia NSCLC, adenocarcinoma. Sh eis status post resection in January 2024. She is on observation and feeling well.   Denies any fever, chills, night sweats, or weight loss. Denies any chest pain, shortness of breath, cough, or hemoptysis. Denies any nausea, vomiting, diarrhea, or constipation. Denies any headache or visual changes. Denies any rashes or skin changes. She recently had a restaging CT scan. The patient is here today for evaluation and to review her scan results.    MEDICAL HISTORY: Past Medical History:  Diagnosis Date   ADHD (attention deficit hyperactivity disorder)    Allergies    Anxiety    Aortic atherosclerosis (HCC)    Arthritis    WRIST AND BACK   Asthma    Atrophic vaginitis    CKD (chronic kidney disease), stage II    Complication of anesthesia    slow to wake up   COPD (chronic obstructive pulmonary disease) (HCC)    pt denies this dx   COVID 2022   states it was a bad case, not hospitalized   COVID-19    Depression    Diverticulosis    Dyspnea    Emphysema/COPD (HCC)    Family history of adverse reaction to anesthesia    mother was hard to wake up, acted weird afterwards   Fibromyalgia    Gastric ulcer    GERD (gastroesophageal reflux disease)    History of  Barrett's esophagus    History of head and neck cancer    History of kidney stones    Hx of colonic polyps    Hyperlipidemia    Hypertension    Insomnia    Lymphoma (HCC)    Lymphoma   Melanosis coli    Osteopenia    Osteoporosis    Plantar fasciitis    Tobacco abuse    Tubular adenoma of colon    Vallecular mass    Varicose veins    Vitamin D deficiency     ALLERGIES:  is allergic to hydrocodone-acetaminophen, penicillins, zestril [lisinopril], chlorthalidone, and latex.  MEDICATIONS:  Current Outpatient Medications  Medication Sig Dispense Refill   amLODipine (NORVASC) 5 MG tablet Take 5 mg by mouth in the morning.     buPROPion (WELLBUTRIN SR) 150 MG 12 hr tablet Take 150 mg by mouth in the morning. (Patient not taking: Reported on 01/19/2023)     Cholecalciferol (DIALYVITE VITAMIN D 5000) 125 MCG (5000 UT) capsule Take 5,000 Units by mouth at bedtime.     Cyanocobalamin (VITAMIN B-12 PO) Take 1 tablet by mouth in the morning.     diclofenac (VOLTAREN) 50 MG EC tablet Take 50 mg by mouth in the morning.     esomeprazole (NEXIUM) 40 MG capsule Take 40 mg by mouth daily before breakfast.     fluticasone (FLONASE)  50 MCG/ACT nasal spray Place 1 spray into both nostrils daily as needed for allergies or rhinitis.     gabapentin (NEURONTIN) 300 MG capsule Take 1 capsule (300 mg total) by mouth 3 (three) times daily. In the morning and at bedtime 90 capsule 1   LORazepam (ATIVAN) 0.5 MG tablet Take 0.25 mg by mouth 2 (two) times daily as needed for anxiety.     Multiple Vitamin (MULTIVITAMIN WITH MINERALS) TABS tablet Take 1 tablet by mouth in the morning.     Olodaterol HCl (STRIVERDI RESPIMAT) 2.5 MCG/ACT AERS INHALE 2 PUFFS BY MOUTH INTO THE LUNGS DAILY 3 each 3   potassium chloride (KLOR-CON M) 10 MEQ tablet Take 10 mEq by mouth every other day. In the morning     rosuvastatin (CRESTOR) 10 MG tablet Take 10 mg by mouth every Monday, Wednesday, and Friday. In the morning      telmisartan (MICARDIS) 40 MG tablet Take 40 mg by mouth at bedtime.     traZODone (DESYREL) 50 MG tablet Take 50 mg by mouth at bedtime.     zolpidem (AMBIEN) 10 MG tablet Take 10 mg by mouth at bedtime as needed for sleep.     No current facility-administered medications for this visit.    SURGICAL HISTORY:  Past Surgical History:  Procedure Laterality Date   ABDOMINAL HYSTERECTOMY     APPENDECTOMY     BILATERAL SALPINGOOPHORECTOMY     BIOPSY  02/25/2022   Procedure: BIOPSY;  Surgeon: Sherrilyn Rist, MD;  Location: [facility];  Service: Gastroenterology;;   BRONCHIAL BIOPSY  05/03/2022   Procedure: BRONCHIAL BIOPSIES;  Surgeon: Josephine Igo, DO;  Location: [facility];  Service: Pulmonary;;   BRONCHIAL BRUSHINGS  05/03/2022   Procedure: BRONCHIAL BRUSHINGS;  Surgeon: Josephine Igo, DO;  Location: [facility];  Service: Pulmonary;;   CHOLECYSTECTOMY     COLONOSCOPY WITH PROPOFOL N/A 02/25/2022   Procedure: COLONOSCOPY WITH PROPOFOL;  Surgeon: Sherrilyn Rist, MD;  Location: [facility];  Service: Gastroenterology;  Laterality: N/A;   ESOPHAGEAL MANOMETRY N/A 04/28/2016   Procedure: ESOPHAGEAL MANOMETRY (EM);  Surgeon: Napoleon Form, MD;  Location: [facility];  Service: Endoscopy;  Laterality: N/A;  dr. Myrtie Neither   ESOPHAGOGASTRODUODENOSCOPY (EGD) WITH PROPOFOL N/A 02/25/2022   Procedure: ESOPHAGOGASTRODUODENOSCOPY (EGD) WITH PROPOFOL;  Surgeon: Sherrilyn Rist, MD;  Location: [facility];  Service: Gastroenterology;  Laterality: N/A;   FIDUCIAL MARKER PLACEMENT  07/02/2022   Procedure: FIDUCIAL DYE MARKING;  Surgeon: Omar Person, MD;  Location: [facility] ENDOSCOPY;  Service: Pulmonary;;   KNEE ARTHROSCOPY     LYMPH NODE BIOPSY     TONSILLECTOMY     UPPER GASTROINTESTINAL ENDOSCOPY     WRIST SURGERY Right    XI ROBOTIC ASSISTED THORACOSCOPY- SEGMENTECTOMY Right 07/02/2022   Procedure: XI ROBOTIC ASSISTED THORACOSCOPY-RIGHT LOWER LOBE SEGMENTECTOMY;  Surgeon:  Loreli Slot, MD;  Location: [facility] OR;  Service: Thoracic;  Laterality: Right;    REVIEW OF SYSTEMS:   Review of Systems  Constitutional: Negative for appetite change, chills, fatigue, fever and unexpected weight change.  HENT:   Negative for mouth sores, nosebleeds, sore throat and trouble swallowing.   Eyes: Negative for eye problems and icterus.  Respiratory: Negative for cough, hemoptysis, shortness of breath and wheezing.   Cardiovascular: Negative for chest pain and leg swelling.  Gastrointestinal: Negative for abdominal pain, constipation, diarrhea, nausea and vomiting.  Genitourinary: Negative for bladder incontinence, difficulty urinating, dysuria, frequency and hematuria.  Musculoskeletal: Negative for back pain, gait problem, neck pain and neck stiffness.  Skin: Negative for itching and rash.  Neurological: Negative for dizziness, extremity weakness, gait problem, headaches, light-headedness and seizures.  Hematological: Negative for adenopathy. Does not bruise/bleed easily.  Psychiatric/Behavioral: Negative for confusion, depression and sleep disturbance. The patient is not nervous/anxious.     PHYSICAL EXAMINATION:  There were no vitals taken for this visit.  ECOG PERFORMANCE STATUS: {CHL ONC ECOG [id number]  Physical Exam  Constitutional: Oriented to person, place, and time and well-developed, well-nourished, and in no distress. No distress.  HENT:  Head: Normocephalic and atraumatic.  Mouth/Throat: Oropharynx is clear and moist. No oropharyngeal exudate.  Eyes: Conjunctivae are normal. Right eye exhibits no discharge. Left eye exhibits no discharge. No scleral icterus.  Neck: Normal range of motion. Neck supple.  Cardiovascular: Normal rate, regular rhythm, normal heart sounds and intact distal pulses.   Pulmonary/Chest: Effort normal and breath sounds normal. No respiratory distress. No wheezes. No rales.  Abdominal: Soft. Bowel sounds are normal. Exhibits  no distension and no mass. There is no tenderness.  Musculoskeletal: Normal range of motion. Exhibits no edema.  Lymphadenopathy:    No cervical adenopathy.  Neurological: Alert and oriented to person, place, and time. Exhibits normal muscle tone. Gait normal. Coordination normal.  Skin: Skin is warm and dry. No rash noted. Not diaphoretic. No erythema. No pallor.  Psychiatric: Mood, memory and judgment normal.  Vitals reviewed.  LABORATORY DATA: Lab Results  Component Value Date   WBC 7.7 01/25/2023   HGB 13.2 01/25/2023   HCT 40.2 01/25/2023   MCV 93.1 01/25/2023   PLT 263 01/25/2023      Chemistry      Component Value Date/Time   NA 141 01/25/2023 1055   NA 137 04/22/2017 1407   K 4.3 01/25/2023 1055   K 5.0 04/22/2017 1407   CL 105 01/25/2023 1055   CO2 30 01/25/2023 1055   CO2 24 04/22/2017 1407   BUN 17 01/25/2023 1055   BUN 21.6 04/22/2017 1407   CREATININE 0.61 01/25/2023 1055   CREATININE 0.8 04/22/2017 1407      Component Value Date/Time   CALCIUM 9.3 01/25/2023 1055   CALCIUM 9.5 04/22/2017 1407   ALKPHOS 87 01/25/2023 1055   ALKPHOS 91 04/22/2017 1407   AST 13 (L) 01/25/2023 1055   AST 18 04/22/2017 1407   ALT 12 01/25/2023 1055   ALT 16 04/22/2017 1407   BILITOT 0.5 01/25/2023 1055   BILITOT 0.55 04/22/2017 1407       RADIOGRAPHIC STUDIES:  CT Chest W Contrast  Result Date: 01/27/2023 CLINICAL DATA:  Non-small cell lung cancer staging; * Tracking Code: BO * EXAM: CT CHEST WITH CONTRAST TECHNIQUE: Multidetector CT imaging of the chest was performed during intravenous contrast administration. RADIATION DOSE REDUCTION: This exam was performed according to the departmental dose-optimization program which includes automated exposure control, adjustment of the mA and/or kV according to patient size and/or use of iterative reconstruction technique. CONTRAST:  75mL OMNIPAQUE IOHEXOL 300 MG/ML  SOLN COMPARISON:  Multiple priors, most recent chest CT dated  June 11, 2023 FINDINGS: Cardiovascular: Normal heart size. No pericardial effusion. Normal caliber thoracic aorta with mild atherosclerotic disease. No visible coronary artery calcifications. Mediastinum/Nodes: Esophagus and thyroid are unremarkable. No enlarged lymph nodes seen in the chest. Lungs/Pleura: Central airways are patent. Mild upper lobe predominant paraseptal emphysema. Postsurgical findings of right lower lobe segmentectomy. Decreased soft tissue is seen along the resection margin, consistent with  evolving postsurgical change. New clustered small solid pulmonary nodules are seen in the right hemithorax. Largest are right middle lobe nodules measuring 7 mm on 6 mm on series 4, image 96. Additional stable small solid pulmonary nodules are seen. No pleural effusion. Upper Abdomen: Small arterially enhancing lesion of the liver dome measuring 5 mm on series 2, image 45, likely a flash fill hemangioma or perfusional. Prior cholecystectomy. Musculoskeletal: No chest wall abnormality. No acute or significant osseous findings. IMPRESSION: 1. Postsurgical findings of right lower lobe segmentectomy. Decreased soft tissue is seen along the resection margin, consistent with evolving postsurgical change. 2. New clustered small solid pulmonary nodules in the right hemithorax, largest measuring 7 mm, likely due to infection or aspiration. Recommend attention on follow-up. 3. No enlarged lymph nodes seen in the chest. 4. Aortic Atherosclerosis (ICD10-I70.0) and Emphysema (ICD10-J43.9). Electronically Signed   By: Allegra Lai M.D.   On: 01/27/2023 08:51     ASSESSMENT/PLAN:  This is a very pleasant 62 years old white female recently diagnosed with a stage Ia (T1 a, N0, M0) non-small cell lung cancer, adenocarcinoma presented with right lower lobe lung nodule status post right lower lobe superior segmentectomy under the care of Dr. Dorris Fetch on July 02, 2022 with tumor size of 0.8 cm and no evidence  for visceral pleural or lymphovascular invasion and the dissected lymph nodes were negative for malignancy. Her brother is my patient and he has ALK positive adenocarcinoma of the lung and has been on treatment with targeted therapy for more than 10 years.   She is on observation and feeling well.   The patient was seen with Dr. Arbutus Ped today.  Dr. Arbutus Ped personally and independently reviewed the scan and discussed results with the patient today.  The scan showed ***.  Dr. Arbutus Ped recommends ***  We will see her back in 6 months with a repeat CT scan of the chest.   The patient was advised to call immediately if she has any concerning symptoms in the interval. The patient voices understanding of current disease status and treatment options and is in agreement with the current care plan. All questions were answered. The patient knows to call the clinic with any problems, questions or concerns. We can certainly see the patient much sooner if necessary       No orders of the defined types were placed in this encounter.    I spent {CHL ONC TIME VISIT - GMWNU:[id number]} counseling the patient face to face. The total time spent in the appointment was {CHL ONC TIME VISIT - HKVQQ:[id number]}.  Phyllis Whitefield L Naliyah Neth, PA-C 01/29/23

## 2023-02-02 ENCOUNTER — Inpatient Hospital Stay (HOSPITAL_BASED_OUTPATIENT_CLINIC_OR_DEPARTMENT_OTHER): Payer: BC Managed Care – PPO | Admitting: Physician Assistant

## 2023-02-02 DIAGNOSIS — C3490 Malignant neoplasm of unspecified part of unspecified bronchus or lung: Secondary | ICD-10-CM | POA: Diagnosis not present

## 2023-02-02 DIAGNOSIS — C3431 Malignant neoplasm of lower lobe, right bronchus or lung: Secondary | ICD-10-CM

## 2023-02-22 ENCOUNTER — Ambulatory Visit: Payer: BC Managed Care – PPO | Admitting: Thoracic Surgery (Cardiothoracic Vascular Surgery)

## 2023-02-22 VITALS — BP 155/86 | HR 83 | Resp 20 | Ht 61.0 in | Wt 155.0 lb

## 2023-02-22 DIAGNOSIS — C3491 Malignant neoplasm of unspecified part of right bronchus or lung: Secondary | ICD-10-CM | POA: Diagnosis not present

## 2023-02-22 DIAGNOSIS — Z9889 Other specified postprocedural states: Secondary | ICD-10-CM | POA: Diagnosis not present

## 2023-02-22 NOTE — Progress Notes (Signed)
[address]       [place] [address]             [phone]     HPI: [name] returns for follow-up after previously having undergone a right lower lobe superior segmentectomy for an adenocarcinoma of the lung.  Cheryl Lesnik is a 62 year old woman with a history of stage Ia adenocarcinoma the right lower lobe, right lower lobe superior segmentectomy, tobacco abuse, COPD, COVID-19, aortic atherosclerosis, stage III chronic kidney disease, lymphoma, reflux, Barrett's esophagus, fibromyalgia, and osteoporosis.  She had a right lower lobe superior segmentectomy in January 2024.  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  She was on gabapentin twice daily but had a lot of side effects from that.  She is currently taking it once a day.  Continues to have some pain particular when she coughs or sneezes.  I reassured her that that was to be expected.  Also complains of chest tightness and shortness of breath with exertion.  Past Medical History:  Diagnosis Date   ADHD (attention deficit hyperactivity disorder)    Allergies    Anxiety    Aortic atherosclerosis (HCC)    Arthritis    WRIST AND BACK   Asthma    Atrophic vaginitis    CKD (chronic kidney disease), stage II    Complication of anesthesia    slow to wake up   COPD (chronic obstructive pulmonary disease) (HCC)    pt denies this dx   COVID 2022   states it was a bad case, not hospitalized   COVID-19    Depression    Diverticulosis    Dyspnea    Emphysema/COPD (HCC)    Family history of adverse reaction to anesthesia    mother was hard to wake up, acted weird afterwards   Fibromyalgia    Gastric ulcer    GERD (gastroesophageal reflux disease)    History of Barrett's esophagus    History of head and neck cancer    History of kidney stones    Hx of colonic polyps    Hyperlipidemia    Hypertension    Insomnia    Lymphoma (HCC)    Lymphoma    Melanosis coli    Osteopenia    Osteoporosis    Plantar fasciitis    Tobacco abuse    Tubular adenoma of colon    Vallecular mass    Varicose veins    Vitamin D deficiency     Current Outpatient Medications  Medication Sig Dispense Refill   amLODipine (NORVASC) 5 MG tablet Take 5 mg by mouth in the morning.     buPROPion (WELLBUTRIN SR) 150 MG 12 hr tablet Take 150 mg by mouth in the morning.     Cholecalciferol (DIALYVITE VITAMIN D 5000) 125 MCG (5000 UT) capsule Take 5,000 Units by mouth at bedtime.     Cyanocobalamin (VITAMIN B-12 PO) Take 1 tablet by mouth in the morning.     diclofenac (VOLTAREN) 50 MG EC tablet Take 50 mg by mouth in the morning.     esomeprazole (NEXIUM) 40 MG capsule Take 40 mg by mouth daily before breakfast.     fluticasone (FLONASE) 50 MCG/ACT nasal spray Place 1 spray into both nostrils daily as needed for allergies or rhinitis.     gabapentin (NEURONTIN) 300 MG capsule Take 1 capsule (300 mg total) by mouth 3 (three) times daily. In the morning  and at bedtime 90 capsule 1   LORazepam (ATIVAN) 0.5 MG tablet Take 0.25 mg by mouth 2 (two) times daily as needed for anxiety.     Multiple Vitamin (MULTIVITAMIN WITH MINERALS) TABS tablet Take 1 tablet by mouth in the morning.     Olodaterol HCl (STRIVERDI RESPIMAT) 2.5 MCG/ACT AERS INHALE 2 PUFFS BY MOUTH INTO THE LUNGS DAILY 3 each 3   potassium chloride (KLOR-CON M) 10 MEQ tablet Take 10 mEq by mouth every other day. In the morning     rosuvastatin (CRESTOR) 10 MG tablet Take 10 mg by mouth every Monday, Wednesday, and Friday. In the morning     telmisartan (MICARDIS) 40 MG tablet Take 40 mg by mouth at bedtime.     traZODone (DESYREL) 50 MG tablet Take 50 mg by mouth at bedtime.     zolpidem (AMBIEN) 10 MG tablet Take 10 mg by mouth at bedtime as needed for sleep.     No current facility-administered medications for this visit.    Physical Exam BP (!) 155/86   Pulse 83   Resp 20   Ht 5\' 1"  (1.549 m)    Wt 155 lb (70.3 kg)   SpO2 95% Comment: RA  BMI 29.8 kg/m  62 year old woman in no acute distress Alert and oriented x 3 with no focal deficits Lungs slightly diminished but equal bilaterally, no wheezing Cardiac regular rate and rhythm No cervical or supraclavicular adenopathy Incisions well-healed  Diagnostic Tests: CT CHEST WITH CONTRAST   TECHNIQUE: Multidetector CT imaging of the chest was performed during intravenous contrast administration.   RADIATION DOSE REDUCTION: This exam was performed according to the departmental dose-optimization program which includes automated exposure control, adjustment of the mA and/or kV according to patient size and/or use of iterative reconstruction technique.   CONTRAST:  75mL OMNIPAQUE IOHEXOL 300 MG/ML  SOLN   COMPARISON:  Multiple priors, most recent chest CT dated June 11, 2023   FINDINGS: Cardiovascular: Normal heart size. No pericardial effusion. Normal caliber thoracic aorta with mild atherosclerotic disease. No visible coronary artery calcifications.   Mediastinum/Nodes: Esophagus and thyroid are unremarkable. No enlarged lymph nodes seen in the chest.   Lungs/Pleura: Central airways are patent. Mild upper lobe predominant paraseptal emphysema. Postsurgical findings of right lower lobe segmentectomy. Decreased soft tissue is seen along the resection margin, consistent with evolving postsurgical change. New clustered small solid pulmonary nodules are seen in the right hemithorax. Largest are right middle lobe nodules measuring 7 mm on 6 mm on series 4, image 96. Additional stable small solid pulmonary nodules are seen. No pleural effusion.   Upper Abdomen: Small arterially enhancing lesion of the liver dome measuring 5 mm on series 2, image 45, likely a flash fill hemangioma or perfusional. Prior cholecystectomy.   Musculoskeletal: No chest wall abnormality. No acute or significant osseous findings.    IMPRESSION: 1. Postsurgical findings of right lower lobe segmentectomy. Decreased soft tissue is seen along the resection margin, consistent with evolving postsurgical change. 2. New clustered small solid pulmonary nodules in the right hemithorax, largest measuring 7 mm, likely due to infection or aspiration. Recommend attention on follow-up. 3. No enlarged lymph nodes seen in the chest. 4. Aortic Atherosclerosis (ICD10-I70.0) and Emphysema (ICD10-J43.9).     Electronically Signed   By: Allegra Lai M.D.   On: 01/27/2023 08:51 I reviewed her CT from 01/25/2023.  Small nodules in the right lower and middle lobes, likely infectious.  No evidence of recurrent disease.  Impression: Nakari  Emily Santiago is a 62 year old woman with a history of stage Ia adenocarcinoma the right lower lobe, right lower lobe superior segmentectomy, tobacco abuse, COPD, COVID-19, aortic atherosclerosis, stage III chronic kidney disease, lymphoma, reflux, Barrett's esophagus, fibromyalgia, and osteoporosis.  Stage Ia adenocarcinoma-status post right lower lobe superior segmentectomy.  No evidence of recurrent disease.  Lung nodules-multiple small nodules noted in right lung on CT.  Likely infectious/inflammatory.  Doubt malignancy.  She will have another CT in 6 months.  Status post right lower lobe superior segmentectomy-continues to have some intercostal neuralgia pain.  On gabapentin once daily.  Not on narcotics.  A lot of her pain is when she coughs or sneezes.  That is to be expected.  She may always have some degree of discomfort there but overall doing well.  Tobacco abuse-quit smoking in 2022  Chest tightness and shortness of breath with exertion-no issues at rest.  She had good pulmonary function testing prior to surgery.  We only resected a very small amount of her lung so she should be at about 95% of her previous function, which is not enough to explain her shortness of breath.  Chest tightness and her  noting drops in her heart rates concerning.  She has an appointment with her primary tomorrow.  I recommend she discuss that with them at this time.  A cardiac workup might be necessary.  Plan: She will continue to follow-up with Dr. Asa Lente office and Dr. Judeth Horn of pulmonology. I will be happy to see her back anytime if I can be of any further assistance with her care  Loreli Slot, MD [facility] [phone]

## 2023-02-23 DIAGNOSIS — Z23 Encounter for immunization: Secondary | ICD-10-CM | POA: Diagnosis not present

## 2023-02-23 DIAGNOSIS — J449 Chronic obstructive pulmonary disease, unspecified: Secondary | ICD-10-CM | POA: Diagnosis not present

## 2023-02-23 DIAGNOSIS — R079 Chest pain, unspecified: Secondary | ICD-10-CM | POA: Diagnosis not present

## 2023-02-23 DIAGNOSIS — R0609 Other forms of dyspnea: Secondary | ICD-10-CM | POA: Diagnosis not present

## 2023-03-21 DIAGNOSIS — M79671 Pain in right foot: Secondary | ICD-10-CM | POA: Diagnosis not present

## 2023-03-21 DIAGNOSIS — M79644 Pain in right finger(s): Secondary | ICD-10-CM | POA: Diagnosis not present

## 2023-03-21 DIAGNOSIS — R253 Fasciculation: Secondary | ICD-10-CM | POA: Diagnosis not present

## 2023-03-23 ENCOUNTER — Other Ambulatory Visit: Payer: Self-pay | Admitting: Family Medicine

## 2023-03-23 ENCOUNTER — Ambulatory Visit
Admission: RE | Admit: 2023-03-23 | Discharge: 2023-03-23 | Disposition: A | Discharge status: Home / Self Care | Payer: BC Managed Care – PPO | Source: Ambulatory Visit | Attending: Family Medicine | Admitting: Family Medicine

## 2023-03-23 DIAGNOSIS — R2241 Localized swelling, mass and lump, right lower limb: Secondary | ICD-10-CM | POA: Diagnosis not present

## 2023-03-23 DIAGNOSIS — M79671 Pain in right foot: Secondary | ICD-10-CM

## 2023-03-23 DIAGNOSIS — M79644 Pain in right finger(s): Secondary | ICD-10-CM

## 2023-03-23 DIAGNOSIS — M19041 Primary osteoarthritis, right hand: Secondary | ICD-10-CM | POA: Diagnosis not present

## 2023-03-23 DIAGNOSIS — M2011 Hallux valgus (acquired), right foot: Secondary | ICD-10-CM | POA: Diagnosis not present

## 2023-03-23 DIAGNOSIS — M19071 Primary osteoarthritis, right ankle and foot: Secondary | ICD-10-CM | POA: Diagnosis not present

## 2023-03-30 ENCOUNTER — Ambulatory Visit: Payer: BC Managed Care – PPO | Admitting: Podiatry

## 2023-04-01 ENCOUNTER — Ambulatory Visit: Payer: BC Managed Care – PPO | Attending: Cardiology | Admitting: Cardiology

## 2023-04-01 ENCOUNTER — Encounter: Payer: Self-pay | Admitting: *Deleted

## 2023-04-01 ENCOUNTER — Encounter: Payer: Self-pay | Admitting: Cardiology

## 2023-04-01 ENCOUNTER — Telehealth: Payer: Self-pay

## 2023-04-01 VITALS — BP 158/74 | HR 74 | Resp 16 | Ht 61.0 in | Wt 157.0 lb

## 2023-04-01 DIAGNOSIS — M79605 Pain in left leg: Secondary | ICD-10-CM

## 2023-04-01 DIAGNOSIS — R0609 Other forms of dyspnea: Secondary | ICD-10-CM

## 2023-04-01 DIAGNOSIS — R634 Abnormal weight loss: Secondary | ICD-10-CM | POA: Diagnosis not present

## 2023-04-01 DIAGNOSIS — I10 Essential (primary) hypertension: Secondary | ICD-10-CM

## 2023-04-01 DIAGNOSIS — Z0000 Encounter for general adult medical examination without abnormal findings: Secondary | ICD-10-CM

## 2023-04-01 DIAGNOSIS — R072 Precordial pain: Secondary | ICD-10-CM

## 2023-04-01 DIAGNOSIS — M79604 Pain in right leg: Secondary | ICD-10-CM | POA: Diagnosis not present

## 2023-04-01 MED ORDER — AMLODIPINE BESYLATE 5 MG PO TABS
7.5000 mg | ORAL_TABLET | Freq: Every day | ORAL | 2 refills | Status: AC
Start: 2023-04-01 — End: ?

## 2023-04-01 NOTE — Progress Notes (Signed)
Cardiology Office Note:  .   Date:  04/01/2023  ID:  Emily Santiago, DOB 12-07-1960, MRN [id number] PCP: Emily Coe, MD  Emily Santiago Cardiologist:  Emily Mainland, MD PCP: Emily Coe, MD  Chief Complaint  Patient presents with   Chest Pain   New Patient (Initial Visit)      History of Present Illness: .    Emily Santiago is a 62 y.o. 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  She also reports intermittent chest burning, but is largely nonexertional.  She has known history of Barrett's esophagus.  Separately, she reports pain in both legs, mainly in her feet.  Blood pressure is elevated today, and has been elevated recently.  Vitals:   04/01/23 1002  BP: (!) 158/74  Pulse: 74  Resp: 16  SpO2: 98%     ROS:  Review of Systems  Cardiovascular:  Positive for chest pain and dyspnea on exertion. Negative for leg swelling, palpitations and syncope.  Musculoskeletal:        Pain in both legs/feet     Studies Reviewed: Marland Kitchen       EKG 04/01/2023: Normal sinus rhythm Normal ECG When compared with ECG of 11-Jul-2022 13:53, No significant change was found     CT chest w/contrast 01/25/2023: 1. Postsurgical findings of right lower lobe segmentectomy. Decreased soft tissue is seen along the resection margin, consistent with evolving postsurgical change. 2. New clustered small solid pulmonary nodules in the right hemithorax, largest measuring 7 mm, likely due to infection or aspiration. Recommend attention on follow-up. 3. No enlarged lymph nodes seen in the chest. 4. Aortic Atherosclerosis (ICD10-I70.0) and Emphysema (ICD10-J43.9).  Labs 01/2023:  Unremarkable No lipid panel available     Physical Exam:   Physical Exam Vitals and nursing note reviewed.  Constitutional:      General: She is not in acute distress. Neck:     Vascular: No JVD.  Cardiovascular:     Rate and Rhythm: Normal rate and regular rhythm.     Pulses:          Dorsalis pedis pulses are 2+ on the right side and 1+ on the left side.       Posterior tibial pulses are 2+ on the right side and 2+ on the left side.     Heart sounds: Normal heart sounds. No murmur heard. Pulmonary:     Effort: Pulmonary effort is normal.     Breath sounds: Normal breath sounds. No wheezing or rales.  Musculoskeletal:     Right lower leg: No edema.     Left lower leg: No edema.      VISIT DIAGNOSES:   ICD-10-CM   1. Precordial pain  R07.2 EKG 12-Lead    MYOCARDIAL PERFUSION IMAGING    ECHOCARDIOGRAM COMPLETE    amLODipine (NORVASC) 5 MG tablet    Referral to HRT/VAS Care Navigation    Cardiac Stress Test: Informed Consent Details: Physician/Practitioner Attestation; Transcribe to consent form and obtain patient signature    2. Dyspnea on exertion  R06.09 MYOCARDIAL PERFUSION IMAGING    ECHOCARDIOGRAM COMPLETE    amLODipine (NORVASC) 5 MG tablet    Cardiac Stress Test: Informed Consent Details: Physician/Practitioner Attestation; Transcribe to consent form and obtain patient  signature    3. Bilateral leg pain  M79.604 VAS [place] ABI WITH/WO TBI   M79.605 amLODipine (NORVASC) 5 MG tablet    4. Weight loss  R63.4 amLODipine (NORVASC) 5 MG tablet    Referral to HRT/VAS Care Navigation    5. Essential hypertension  I10 Referral to HRT/VAS Care Navigation       ASSESSMENT AND PLAN: .    Emily Santiago is a 62 y.o. 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  Exertional dyspnea most likely related to COPD.  Chest pain appears largely nonanginal.  However, she has risk factors for CAD including prior history of smoking, aortic atherosclerosis.  Recommend echocardiogram and exercise nuclear stress test.  Leg pain: Less likely to be claudication.  However, given CV risk factors, will check screening ABI.  Hypertension: Uncontrolled.  Increase amlodipine to 7.5 mg daily.   Informed Consent   Shared Decision Making/Informed Consent{The risks [chest pain, shortness of breath, cardiac arrhythmias, dizziness, blood pressure fluctuations, myocardial infarction, stroke/transient ischemic attack, and life-threatening complications (estimated to be 1 in 10,000)], benefits (risk stratification, diagnosing coronary artery disease, treatment guidance) and alternatives of an exercise tolerance test were discussed in detail with Emily Santiago and she agrees to proceed.       Meds ordered this encounter  Medications   amLODipine (NORVASC) 5 MG tablet    Sig: Take 1.5 tablets (7.5 mg total) by mouth daily.    Dispense:  135 tablet    Refill:  2    DOSE INCREASE     F/u in 6-weeks with APP to follow-up on hypertension and discuss about test results.  If no significant abnormalities noted on blood pressure well-controlled, she can be seen as needed after that.  Signed, Elder Negus, MD

## 2023-04-01 NOTE — Patient Instructions (Signed)
Medication Instructions:   INCREASE YOUR AMLODIPINE TO 7.5 MG BY MOUTH DAILY  *If you need a refill on your cardiac medications before your next appointment, please call your pharmacy*    You have been referred to [facility]--[phone]   Testing/Procedures:  Your physician has requested that you have an echocardiogram. Echocardiography is a painless test that uses sound waves to create images of your heart. It provides your doctor with information about the size and shape of your heart and how well your heart's chambers and valves are working. This procedure takes approximately one hour. There are no restrictions for this procedure. Please do NOT wear cologne, perfume, aftershave, or lotions (deodorant is allowed). Please arrive 15 minutes prior to your appointment time.   Your physician has requested that you have en exercise stress myoview. For further information please visit [email or web address] Please follow instruction sheet, as given.  Your physician has requested that you have an ankle brachial index (ABI). During this test an ultrasound and blood pressure cuff are used to evaluate the arteries that supply the arms and legs with blood. Allow thirty minutes for this exam. There are no restrictions or special instructions.    Follow-Up:  6 WEEKS WITH AN EXTENDER IN THE OFFICE     Diet & Lifestyle recommendations:  Physical activity recommendation (The Physical Activity Guidelines for Americans. JAMA 2018;Nov 12) At least 150-300 minutes a week of moderate-intensity, or 75-150 minutes a week of vigorous-intensity aerobic physical activity, or an equivalent combination of moderate- and vigorous-intensity aerobic activity. Adults should perform muscle-strengthening activities on 2 or more days a week. Older adults should do multicomponent physical activity that includes balance training as well as aerobic and muscle-strengthening activities.  Benefits of increased physical activity include lower risk of mortality including cardiovascular mortality, lower risk of cardiovascular events and associated risk factors (hypertension and diabetes), and lower risk of many cancers (including bladder, breast, colon, endometrium, esophagus, kidney, lung, and stomach). Additional improvments have been seen in cognition, risk of dementia, anxiety and depression, improved bone health, lower risk of falls, and associated injuries.  Dietary recommendation The 2019 ACC/AHA guidelines promote nutrition as a main fixture of cardiovascular wellness, with a recommendation for a varied diet of fruit, vegetables, fish, legumes, and whole grains (Class I), as well as recommendations to reduce sodium, cholesterol, processed meats, and refined sugars (Class IIa recommendation).10 Sodium intake, a topic of some controversy as of late, is recommended to be kept at 1,500 mg/day or less, far below the average daily intake in the [place] of 3,409 mg/day, and notably below that of previous [place] recommendations for 300mg /day.10,11 For those unable to reach 1,500 mg/day, they recommend at least a reduction of 1000 mg/day.  A Pesco-Mediterranean Diet With Intermittent Fasting: JACC Review Topic of the Week. J Am Coll Cardiol 2020;76:1484-1493 Pesco-Mediterranean diet, it is supplemented with extra-virgin olive oil (EVOO), which is the principle fat source, along with moderate amounts of dairy (particularly yogurt and cheese) and eggs, as well as modest amounts of alcohol consumption (ideally red wine with the evening meal), but few red and processed meats.

## 2023-04-01 NOTE — Telephone Encounter (Signed)
Called patient per health coaching referral for healthy eating per Dr. Rosemary Holms. Patient has been scheduled for her initial health coaching session in person on 10/30 at 12:30pm.   Renaee Munda, MS, ERHD, [facility]  Care Guide, Health & Wellness Coach [address] [place] [address] Telephone: [phone] Email: [email or web address]

## 2023-04-12 ENCOUNTER — Telehealth: Payer: Self-pay

## 2023-04-12 DIAGNOSIS — Z0000 Encounter for general adult medical examination without abnormal findings: Secondary | ICD-10-CM

## 2023-04-12 NOTE — Telephone Encounter (Signed)
Returned patient's call to clarify location and time for health coaching appointment for tomorrow. Clarified appointment for 4:00pm at [facility]. Provided address for patient. Patient did not express any additional questions or concerns.    Renaee Munda, MS, ERHD, [facility]  Care Guide, Health & Wellness Coach [address], [address] [place] [address] Telephone: [phone] Email: [email or web address].

## 2023-04-13 ENCOUNTER — Encounter: Payer: Self-pay | Admitting: Podiatry

## 2023-04-13 ENCOUNTER — Ambulatory Visit: Payer: BC Managed Care – PPO | Attending: Cardiology

## 2023-04-13 ENCOUNTER — Ambulatory Visit (INDEPENDENT_AMBULATORY_CARE_PROVIDER_SITE_OTHER): Payer: BC Managed Care – PPO | Admitting: Podiatry

## 2023-04-13 DIAGNOSIS — R600 Localized edema: Secondary | ICD-10-CM | POA: Diagnosis not present

## 2023-04-13 DIAGNOSIS — M7751 Other enthesopathy of right foot: Secondary | ICD-10-CM

## 2023-04-13 DIAGNOSIS — Z0000 Encounter for general adult medical examination without abnormal findings: Secondary | ICD-10-CM

## 2023-04-13 MED ORDER — TRIAMCINOLONE ACETONIDE 10 MG/ML IJ SUSP
10.0000 mg | Freq: Once | INTRAMUSCULAR | Status: AC
Start: 2023-04-13 — End: 2023-04-13
  Administered 2023-04-13: 10 mg via INTRA_ARTICULAR

## 2023-04-13 NOTE — Progress Notes (Unsigned)
HEALTH & WELLNESS COACHING INITIAL INTAKE   Appointment Outcome: Completed, Session #: Initial                        Start time: 12:35pm   End time: 1:40pm   Total Mins: 65 minutes     What are the Patient's goals from Coaching?  Patient wants to improve her eating habits and increase her exercise.   Why did they seek coaching now? Patient wants to improve her blood pressure to reduce the number of blood pressure medications that she takes.   Readiness - What stage is the patient in regarding their goal(s)?   Patient is in the action stage of increasing her exercise because she is walking 3 days per week. Patient is in the preparation stage of improving her eating habits.   Coaching Progress Notes:  Discussed with patient her eating habits she believes she can change to help improve her blood pressure. Patient stated that she has a challenge with eating salty snacks such as chips. Patient shared that if they are not in the house, she does not have much of an issue with consuming a lot of sodium. Patient mentioned that she is borderline diabetic and would like to lower her risk of developing diabetes.  Patient shared that she sometimes struggles with knowing what to eat to control her blood pressure. Patient mentioned that she uses to meal plan and prep in addition to exercising prior to being diagnosed with cancer and having spot removed on her lung. Patient shared that she has gained weight since she has not been as active due to having shortness of breath.  Patient stated that she currently walks 1 mile 3 days per week, and it takes her approximately 1 hour due to walking down [address] and having to cross lights.   Discussed with patient strategies that she has incorporated before that was helpful in maintaining a helpful weight previously. Patient stated that she used to read food labels and meal prep. Patient stated that meal prepping would have to be something to work toward  implementing later as a strategy, but she could start reading food labels again. Discussed with patient the recommendation from the American Heart Association the allotment of 1500mg  of sodium per day for individuals with hypertension. Provided patient with a food label and educated patient on how to properly read the label per serving size and number of servings consumed. Discussed with patient additional ways to reduce consumption of sodium via eating fresh produce verses canned goods.   Discussed with patient the recommended amount 25 grams of added sugar per day by the American Heart Association for women. Provided patient with a visual for educational purposes. Discussed with patient practicing portion control per recommended serving size and how to divide plate based on the diabetic plate method (I/1 non-starchy vegetables, 1/4 lean protein, and 1/4 carbohydrate). Patient stated that she is interested in measuring out her portion sizes and is interested in learning the difference in serving sized based on cooked vs uncooked.   Discussed with patient how she would like to increase her exercise and by how much. Patient stated that she cannot do anything strenuous that involves hills or inclines. Patient stated that her breathing impacts what she can do. Discussed exercising at home using online videos that includes walking 1 mile indoors. Patient expressed interest in the idea and stated that she could add an additional day of walking indoors since the three  3 days she walks are on her workdays.      Coaching Outcomes Patient co-created the following agreement that she will start to implement over the next two weeks. Patient mentioned that she wants to work on meal prepping later on.     AGREEMENTS SECTION   Overall Goal(s): Improve healthy eating habits to lower blood pressure by reducing daily sodium intake over the next three months Increase exercise to 150 minutes per week over next three  months    Agreement/Action Steps: Improve healthy eating habits Aim for 1500 mg of sodium per day Read food labels to monitor sodium intake Practice portion control using the diabetic plate method Measure portions per recommended serving size on food label Replace salty snacks with fruit  Increase exercise Walk 1-mile 3 days per week for 45-60 minutes Walk 1-mile 1 day per week following YouTube video 15-20 minutes    Agreement Signed & Returned? Reviewed Coaching Agreement and Code of Ethics with Patient during initial session. Answered any questions the patient had if any regarding the Coaching Agreement and Code of Ethics. Patient verbally agreed to adhere to the Coaching Agreement and to abide by the Code of Ethics.  Mailed patient with a hard/electronic copy of the Coaching Agreement and Code of Ethics.    Resources: Patient was provided with information on the diabetic plate method, how to practice portion control, how to read food labels. Patient was mailed sodium tracker sheets.

## 2023-04-14 NOTE — Progress Notes (Signed)
Subjective:   Patient ID: Emily Santiago, female   DOB: 62 y.o.   MRN: [id number]   HPI Patient presents with a lot of pain in the top and outside of the right foot and states that she has had this for several months.  States that it is very tender to walk on it and it does swell   Review of Systems  All other systems reviewed and are negative.       Objective:  Physical Exam Vitals and nursing note reviewed.  Constitutional:      Appearance: She is well-developed.  Pulmonary:     Effort: Pulmonary effort is normal.  Musculoskeletal:        General: Normal range of motion.  Skin:    General: Skin is warm.  Neurological:     Mental Status: She is alert.     Neurovascular status was found to be intact muscle strength found to be adequate range of motion is adequate with inflammation within the sinus tarsi right and distal to this point with inflammation fluid buildup.  Patient is found to have good digital perfusion well-oriented x 3     Assessment:  Inflammatory capsulitis right sinus tarsi in a more distal direction painful     Plan:  H&P x-rays reviewed organ to start off simple and I did sterile prep I injected the capsule 3 mg Kenalog 5 mg Xylocaine and took it slightly distal to try to reduce the inflammation reappoint to recheck an old x-rays do not indicate signs of fracture currently

## 2023-04-15 ENCOUNTER — Telehealth: Payer: Self-pay | Admitting: Cardiology

## 2023-04-15 NOTE — Telephone Encounter (Signed)
Pt would like a callback regarding plan. Please advise

## 2023-04-18 ENCOUNTER — Telehealth: Payer: Self-pay

## 2023-04-18 DIAGNOSIS — Z0000 Encounter for general adult medical examination without abnormal findings: Secondary | ICD-10-CM

## 2023-04-18 NOTE — Telephone Encounter (Addendum)
Appointment Outcome:  Completed, Session #: Initial Follow-up                       Start time: 8:56am  End time: 9:32am  Total Mins: 36 minutes  AGREEMENTS SECTION   Overall Goal(s): Improve healthy eating habits to lower blood pressure by reducing daily sodium intake over the next three months Increase exercise to 150 minutes per week over next three months       Agreement/Action Steps: Improve healthy eating habits Aim for 1500 mg of sodium per day Read food labels to monitor sodium intake Practice portion control using the diabetic plate method Measure portions per recommended serving size on food label Replace salty snacks with fruit   Increase exercise Walk 1-mile 3 days per week for 45-60 minutes Walk 1-mile 1 day per week following YouTube video 15-20 minutes    Progress Notes:  Patient called to ask questions about her action steps following her initial health coaching session. Patient wanted to know what was the recommended amount of added sugar an individual to consume per day. Patient was interested in determining recommended serving size and a review of how to read food labels. Patient is interested in knowing how much protein to consume at one meal.     Coaching Outcomes: Patient was informed that the American Heart Association recommends that women have no more than 25 grams of added sugar per day and that a individual with hypertension should aim to consume approximately 1,500 mg of sodium per day. Reviewed how to read food labels to calculate the amount of nutrients that she is consuming per serving size.   Patient was informed that protein intake could not be provider per scope of practice. Patient was provided with a link from the USDA to calculate her daily intake protein so that she could determine how much protein to consume per meal.     Resources: Mailed patient information on protein content in food per serving size, the recommendation for added sugar  per day, meal planning sheets, and serving size of cooked vs uncooked foods.

## 2023-04-26 ENCOUNTER — Encounter (HOSPITAL_COMMUNITY): Payer: Self-pay

## 2023-04-27 ENCOUNTER — Ambulatory Visit: Payer: BC Managed Care – PPO

## 2023-05-02 ENCOUNTER — Other Ambulatory Visit: Payer: Self-pay | Admitting: Pulmonary Disease

## 2023-05-03 ENCOUNTER — Telehealth (HOSPITAL_COMMUNITY): Payer: Self-pay | Admitting: *Deleted

## 2023-05-03 NOTE — Telephone Encounter (Signed)
Returned pt's call and told her that she can take medication as normally taken.

## 2023-05-04 ENCOUNTER — Ambulatory Visit (HOSPITAL_BASED_OUTPATIENT_CLINIC_OR_DEPARTMENT_OTHER): Payer: BC Managed Care – PPO

## 2023-05-04 ENCOUNTER — Ambulatory Visit (HOSPITAL_COMMUNITY)
Admission: RE | Admit: 2023-05-04 | Discharge: 2023-05-04 | Disposition: A | Discharge status: Home / Self Care | Payer: BC Managed Care – PPO | Source: Ambulatory Visit | Attending: Cardiovascular Disease | Admitting: Cardiovascular Disease

## 2023-05-04 DIAGNOSIS — R072 Precordial pain: Secondary | ICD-10-CM | POA: Diagnosis not present

## 2023-05-04 DIAGNOSIS — R0609 Other forms of dyspnea: Secondary | ICD-10-CM

## 2023-05-04 DIAGNOSIS — M79604 Pain in right leg: Secondary | ICD-10-CM | POA: Insufficient documentation

## 2023-05-04 DIAGNOSIS — M79605 Pain in left leg: Secondary | ICD-10-CM | POA: Diagnosis not present

## 2023-05-04 LAB — MYOCARDIAL PERFUSION IMAGING
Estimated workload: 1
Exercise duration (min): 1 min
Exercise duration (sec): 0 s
LV dias vol: 56 mL (ref 46–106)
LV sys vol: 18 mL
MPHR: 157 {beats}/min
Nuc Stress EF: 68 %
Peak HR: 98 {beats}/min
Percent HR: 68 %
Rest HR: 66 {beats}/min
Rest Nuclear Isotope Dose: 10.8 mCi
SDS: 0
SRS: 0
SSS: 0
ST Depression (mm): 0 mm
Stress Nuclear Isotope Dose: 32.4 mCi
TID: 0.89

## 2023-05-04 LAB — ECHOCARDIOGRAM COMPLETE
Area-P 1/2: 3.77 cm2
Height: 61 in
S' Lateral: 3.2 cm
Weight: 2512 [oz_av]

## 2023-05-04 MED ORDER — TECHNETIUM TC 99M TETROFOSMIN IV KIT
32.4000 | PACK | Freq: Once | INTRAVENOUS | Status: AC | PRN
  Administered 2023-05-04: 32.4 via INTRAVENOUS

## 2023-05-04 MED ORDER — TECHNETIUM TC 99M TETROFOSMIN IV KIT
10.8000 | PACK | Freq: Once | INTRAVENOUS | Status: AC | PRN
  Administered 2023-05-04: 10.8 via INTRAVENOUS

## 2023-05-04 MED ORDER — REGADENOSON 0.4 MG/5ML IV SOLN
0.4000 mg | Freq: Once | INTRAVENOUS | Status: AC
  Administered 2023-05-04: 0.4 mg via INTRAVENOUS

## 2023-05-05 ENCOUNTER — Telehealth: Payer: Self-pay | Admitting: Cardiology

## 2023-05-05 ENCOUNTER — Telehealth: Payer: Self-pay | Admitting: *Deleted

## 2023-05-05 DIAGNOSIS — E782 Mixed hyperlipidemia: Secondary | ICD-10-CM

## 2023-05-05 DIAGNOSIS — Z79899 Other long term (current) drug therapy: Secondary | ICD-10-CM

## 2023-05-05 LAB — VAS US ABI WITH/WO TBI
Left ABI: 1.13
Right ABI: 1.13

## 2023-05-05 MED ORDER — ASPIRIN 81 MG PO TBEC
81.0000 mg | DELAYED_RELEASE_TABLET | Freq: Every day | ORAL | Status: AC
Start: 2023-05-05 — End: ?

## 2023-05-05 NOTE — Telephone Encounter (Signed)
-----   Message from [facility] sent at 05/05/2023  8:29 AM EST ----- Normal heart function, no significant valve abnormalities.  Normal leg artery circulation.  Small area of possible scar noted on stress test.  No heart catheterization needed, unless chest pain is intractable. Needs aggressive risk factor modification with hypertension control.  Recommend checking lipid panel.  Depending on the results, she would likely need high intensity statin.  If no bleeding issues, recommend aspirin 81 mg daily.  Thanks MJP

## 2023-05-05 NOTE — Telephone Encounter (Signed)
Patient is calling requesting a callback to speak with you. Please advise.

## 2023-05-05 NOTE — Telephone Encounter (Signed)
The patient has been notified of the result and verbalized understanding.  All questions (if any) were answered.  Pt aware to start taking ASA 81 mg po daily.  She is aware that we need her to come in for lab work either tomorrow or next week, to recheck a lipid panel on her.  Pt stated she can come for lab work on next Wednesday 11/27 to have lipids checked.  Pt aware to come fasting to this lab appt.  Lipid order placed for then and released in the system.  Pt verbalized understanding and agrees with this plan.

## 2023-05-05 NOTE — Progress Notes (Signed)
Normal heart function, no significant valve abnormalities.  Normal leg artery circulation.  Small area of possible scar noted on stress test.  No heart catheterization needed, unless chest pain is intractable. Needs aggressive risk factor modification with hypertension control.  Recommend checking lipid panel.  Depending on the results, she would likely need high intensity statin.  If no bleeding issues, recommend aspirin 81 mg daily.  Thanks MJP

## 2023-05-09 ENCOUNTER — Ambulatory Visit: Payer: BC Managed Care – PPO | Admitting: Podiatry

## 2023-05-11 ENCOUNTER — Other Ambulatory Visit: Payer: BC Managed Care – PPO

## 2023-05-11 ENCOUNTER — Other Ambulatory Visit: Payer: Self-pay

## 2023-05-11 DIAGNOSIS — Z79899 Other long term (current) drug therapy: Secondary | ICD-10-CM | POA: Diagnosis not present

## 2023-05-11 DIAGNOSIS — E782 Mixed hyperlipidemia: Secondary | ICD-10-CM | POA: Diagnosis not present

## 2023-05-11 LAB — LIPID PANEL
Chol/HDL Ratio: 2.9 {ratio} (ref 0.0–4.4)
Cholesterol, Total: 184 mg/dL (ref 100–199)
HDL: 63 mg/dL (ref >39)
LDL Chol Calc (NIH): 108 mg/dL — ABNORMAL HIGH (ref 0–99)
Triglycerides: 67 mg/dL (ref 0–149)
VLDL Cholesterol Cal: 13 mg/dL (ref 5–40)

## 2023-05-17 ENCOUNTER — Telehealth: Payer: Self-pay

## 2023-05-17 DIAGNOSIS — Z0000 Encounter for general adult medical examination without abnormal findings: Secondary | ICD-10-CM

## 2023-05-17 NOTE — Progress Notes (Unsigned)
Cardiology Office Note    Patient Name: Emily Santiago Date of Encounter: 05/17/2023  Primary Care Provider:  Irven Coe, MD Primary Cardiologist:  None Primary Electrophysiologist: None   Past Medical History    Past Medical History:  Diagnosis Date   ADHD (attention deficit hyperactivity disorder)    Allergies    Anxiety    Aortic atherosclerosis (HCC)    Arthritis    WRIST AND BACK   Asthma    Atrophic vaginitis    CKD (chronic kidney disease), stage II    Complication of anesthesia    slow to wake up   COPD (chronic obstructive pulmonary disease) (HCC)    pt denies this dx   COVID 2022   states it was a bad case, not hospitalized   COVID-19    Depression    Diverticulosis    Dyspnea    Emphysema/COPD (HCC)    Family history of adverse reaction to anesthesia    mother was hard to wake up, acted weird afterwards   Fibromyalgia    Gastric ulcer    GERD (gastroesophageal reflux disease)    History of Barrett's esophagus    History of head and neck cancer    History of kidney stones    Hx of colonic polyps    Hyperlipidemia    Hypertension    Hyponatremia    Insomnia    Lymphoma (HCC)    Lymphoma   Melanosis coli    Neck pain    Osteopenia    Osteoporosis    Plantar fasciitis    Tobacco abuse    Tubular adenoma of colon    Vallecular mass    Varicose veins    Vitamin D deficiency     History of Present Illness  Emily Santiago is a 62 y.o. female with a PMH of aortic atherosclerosis, HTN, DLD, adenocarcinoma s/p segmentectomy, cell lymphoma, CKD stage III who presents today for presents today for 73-month follow-up  [name] was previously followed by Dr. Donnie Santiago and is currently followed by Dr. Rosemary Santiago.  She was seen on 04/01/2023 with complaint of worsening exertional dyspnea chest pain since undergoing chest surgery.  She also reported pain in both legs and feet.  Blood pressure was found to be elevated during visit at 158/74.  A CT of the chest  with contrast on 01/2023 following right lower lobe segmentectomy that revealed aortic atherosclerosis emphysema.  She underwent 2D echo and nuclear stress test for further evaluation.  Stress test results revealed heart pump function with small area of scar noted with low risk and no evidence of ischemia.  2D echo was also completed 55% asymmetric LVH and reduced RV function.  She also underwent lower extremity ultrasound that showed normal leg arteries.  She was started on ASA 81 mg and Crestor 10 mg.   During today's visit the patient reports*** .  Patient denies chest pain, palpitations, dyspnea, PND, orthopnea, nausea, vomiting, dizziness, syncope, edema, weight gain, or early satiety.  ***Notes: Small area of possible scar noted on stress test.  No heart catheterization needed, unless chest pain is intractable. Needs aggressive risk factor modification with hypertension control.  Recommend checking lipid panel.  Depending on the results, she would likely need high intensity statin.  If no bleeding issues, recommend aspirin 81 mg daily.  -Last ischemic evaluation: -Last echo: -Interim ED visits: Review of Systems  Please see the history of present illness.    All other systems reviewed and are otherwise  negative except as noted above.  Physical Exam    Wt Readings from Last 3 Encounters:  05/04/23 157 lb (71.2 kg)  04/01/23 157 lb (71.2 kg)  02/22/23 155 lb (70.3 kg)   UX:NATFT were no vitals filed for this visit.,There is no height or weight on file to calculate BMI. GEN: Well nourished, well developed in no acute distress Neck: No JVD; No carotid bruits Pulmonary: Clear to auscultation without rales, wheezing or rhonchi  Cardiovascular: Normal rate. Regular rhythm. Normal S1. Normal S2.   Murmurs: There is no murmur.  ABDOMEN: Soft, non-tender, non-distended EXTREMITIES:  No edema; No deformity   EKG/LABS/ Recent Cardiac Studies   ECG personally reviewed by me today - ***  Risk  Assessment/Calculations:   {Does this patient have ATRIAL FIBRILLATION?:[phone]}      Lab Results  Component Value Date   WBC 7.7 01/25/2023   HGB 13.2 01/25/2023   HCT 40.2 01/25/2023   MCV 93.1 01/25/2023   PLT 263 01/25/2023   Lab Results  Component Value Date   CREATININE 0.61 01/25/2023   BUN 17 01/25/2023   NA 141 01/25/2023   K 4.3 01/25/2023   CL 105 01/25/2023   CO2 30 01/25/2023   Lab Results  Component Value Date   CHOL 184 05/11/2023   HDL 63 05/11/2023   LDLCALC 108 (H) 05/11/2023   TRIG 67 05/11/2023   CHOLHDL 2.9 05/11/2023    No results found for: "HGBA1C" Assessment & Plan    1.  Dyspnea on exertion:  2.  Precordial pain:  3. Hyperlipidemia:  4.  Essential hypertension:      Disposition: Follow-up with None or APP in *** months {Are you ordering a CV Procedure (e.g. stress test, cath, DCCV, TEE, etc)?   Press F2        :[id number]}   Signed, Emily Santiago, Emily Rains, NP 05/17/2023, 2:25 PM [facility]

## 2023-05-17 NOTE — Telephone Encounter (Signed)
Called patient to reschedule health coaching session from 11/13 due to being out of the office. Patient requested to have an in-person appointment tomorrow after her appt at [facility]. Patient has been scheduled for 12/4 at 3:30pm at [facility].    Renaee Munda, MS, ERHD, [facility]  Care Guide, Health & Wellness Coach [address] [place] [address] Telephone: [phone] Email: [email or web address]

## 2023-05-18 ENCOUNTER — Ambulatory Visit (HOSPITAL_BASED_OUTPATIENT_CLINIC_OR_DEPARTMENT_OTHER): Payer: BC Managed Care – PPO

## 2023-05-18 ENCOUNTER — Telehealth (HOSPITAL_BASED_OUTPATIENT_CLINIC_OR_DEPARTMENT_OTHER): Payer: Self-pay

## 2023-05-18 ENCOUNTER — Ambulatory Visit: Payer: BC Managed Care – PPO | Admitting: Nurse Practitioner

## 2023-05-18 DIAGNOSIS — R0609 Other forms of dyspnea: Secondary | ICD-10-CM

## 2023-05-18 DIAGNOSIS — R072 Precordial pain: Secondary | ICD-10-CM

## 2023-05-18 DIAGNOSIS — S20211A Contusion of right front wall of thorax, initial encounter: Secondary | ICD-10-CM | POA: Diagnosis not present

## 2023-05-18 DIAGNOSIS — W19XXXA Unspecified fall, initial encounter: Secondary | ICD-10-CM | POA: Diagnosis not present

## 2023-05-18 DIAGNOSIS — Z0000 Encounter for general adult medical examination without abnormal findings: Secondary | ICD-10-CM

## 2023-05-18 DIAGNOSIS — I10 Essential (primary) hypertension: Secondary | ICD-10-CM

## 2023-05-18 DIAGNOSIS — S42301A Unspecified fracture of shaft of humerus, right arm, initial encounter for closed fracture: Secondary | ICD-10-CM | POA: Diagnosis not present

## 2023-05-18 DIAGNOSIS — E782 Mixed hyperlipidemia: Secondary | ICD-10-CM

## 2023-05-18 DIAGNOSIS — S42221A 2-part displaced fracture of surgical neck of right humerus, initial encounter for closed fracture: Secondary | ICD-10-CM | POA: Diagnosis not present

## 2023-05-18 DIAGNOSIS — S42291A Other displaced fracture of upper end of right humerus, initial encounter for closed fracture: Secondary | ICD-10-CM | POA: Diagnosis not present

## 2023-05-18 NOTE — Telephone Encounter (Signed)
Called patient to determine if she was in route to appointment. Patient stated that she had fell and broke her shoulder this morning and was just returning home from the ED. Patient requested that her appointment be cancelled because she was not able to hold health coaching session due to being in pain. Patient also stated that she had called to cancel her appointment with Robin Searing, NP at 2:20pm but had not received a call. Sent provider a message regarding the patient's issue so that the appointment could be rescheduled.   Renaee Munda, MS, ERHD, [facility]  Care Guide, Health & Wellness Coach [address] [place] [address] Telephone: [phone] Email: [email or web address]

## 2023-05-19 DIAGNOSIS — S42251A Displaced fracture of greater tuberosity of right humerus, initial encounter for closed fracture: Secondary | ICD-10-CM | POA: Diagnosis not present

## 2023-05-24 ENCOUNTER — Telehealth: Payer: Self-pay

## 2023-05-24 DIAGNOSIS — Z0000 Encounter for general adult medical examination without abnormal findings: Secondary | ICD-10-CM

## 2023-05-24 NOTE — Telephone Encounter (Signed)
Patient called in to reschedule health coaching appointment. Patient requested an appointment on the 19th or 20th after lunch time. Patient has been scheduled for an in-person appointment on 12/20 at 1:00pm.   Renaee Munda, MS, ERHD, [facility]  Care Guide, Health & Wellness Coach [address] [place] [address] Telephone: [phone] Email: [email or web address]

## 2023-06-02 DIAGNOSIS — S42254A Nondisplaced fracture of greater tuberosity of right humerus, initial encounter for closed fracture: Secondary | ICD-10-CM | POA: Diagnosis not present

## 2023-06-03 ENCOUNTER — Telehealth: Payer: Self-pay | Admitting: Licensed Clinical Social Worker

## 2023-06-03 ENCOUNTER — Ambulatory Visit: Payer: BC Managed Care – PPO

## 2023-06-03 DIAGNOSIS — Z0000 Encounter for general adult medical examination without abnormal findings: Secondary | ICD-10-CM

## 2023-06-03 DIAGNOSIS — R634 Abnormal weight loss: Secondary | ICD-10-CM

## 2023-06-03 DIAGNOSIS — E782 Mixed hyperlipidemia: Secondary | ICD-10-CM

## 2023-06-03 NOTE — Addendum Note (Signed)
Addended by: Loa Socks on: 06/03/2023 02:52 PM   Modules accepted: Orders

## 2023-06-03 NOTE — Telephone Encounter (Signed)
Patient returned call and shared concerns about Health Coaching. CSW discussed possibly referring patient to Nutrition and Diabetic Counseling as it might meet patients needs. She shared that she is in need of education regarding sodium and how to read food labels. CSW will reach out to Dr Damian Leavell staff to make appropriate referral. Lasandra Beech, LCSW, CCSW-MCS [phone]

## 2023-06-03 NOTE — Telephone Encounter (Signed)
Message  My understanding is that they will call and schedule. I have informed the patient to expect a call.  Thanks so much! Merry Christmas!  ----- Message -----  From: Loa Socks, LPN  Sent: 13/24/4010   2:52 PM EST  To: Marcy Siren, LCSW

## 2023-06-03 NOTE — Telephone Encounter (Signed)
Referral to Nutrition and Diabetic Education placed in the system.  Will make LCSW aware of referral placed to them.

## 2023-06-03 NOTE — Telephone Encounter (Signed)
H&V Care Navigation CSW Progress Note  Clinical Social Worker contacted patient by phone to update her that Amy, Health Coach is out of office today and will contact her to reschedule. Shared that since appt was in person we wanted to share update in scheduling with her early to ensure she didn't have to make the trip. Pt shares some concerns/hesitation with rescheduling and I shared I would ask our team lead to reach out to her today if able. No additional questions for this Clinical research associate.   Patient is participating in a Managed Medicaid Plan:  No, BCBS  SDOH Screenings   Tobacco Use: Medium Risk (04/13/2023)    Octavio Graves, MSW, LCSW Clinical Social Worker II [facility] Heart[facility]Vascular Care Navigation  [phone]- work cell phone (preferred) [phone]- desk phone

## 2023-06-03 NOTE — Telephone Encounter (Signed)
CSW contacted patient to follow up on concerns regarding cancelled visit with Health Coach. CSW contacted patient and left message for return call. Lasandra Beech, LCSW, CCSW-MCS [phone]

## 2023-06-14 ENCOUNTER — Telehealth: Payer: Self-pay

## 2023-06-14 DIAGNOSIS — Z0000 Encounter for general adult medical examination without abnormal findings: Secondary | ICD-10-CM

## 2023-06-14 NOTE — Telephone Encounter (Signed)
 Called patient to reschedule health coaching appt due to being out of office on 12/20. Patient did not have access to calendar and was not home at the time to determine her availability. Patient stated that she will call this evening to reschedule.   Greig Ruth, MS, ERHD, [facility]  Care Guide, Health & Wellness Coach [address] [place] [address] Telephone: [phone] Email: [email or web address]

## 2023-06-20 DIAGNOSIS — S42211A Unspecified displaced fracture of surgical neck of right humerus, initial encounter for closed fracture: Secondary | ICD-10-CM | POA: Diagnosis not present

## 2023-06-28 ENCOUNTER — Telehealth (HOSPITAL_COMMUNITY): Payer: Self-pay | Admitting: Licensed Clinical Social Worker

## 2023-06-28 NOTE — Telephone Encounter (Signed)
 CSW attempted to contact patient to inform of the upcoming virtual Healthy Eating workshop. CSW left message for return call and to provide email address if interested in attending the virtual workshop. CSW available as needed. Lonell Crouch, LCSW, CCSW-MCS [phone]

## 2023-07-06 ENCOUNTER — Encounter: Payer: Self-pay | Admitting: *Deleted

## 2023-07-06 ENCOUNTER — Encounter: Payer: Self-pay | Admitting: Diagnostic Neuroimaging

## 2023-07-06 ENCOUNTER — Ambulatory Visit: Payer: BC Managed Care – PPO | Admitting: Diagnostic Neuroimaging

## 2023-07-06 VITALS — BP 137/87 | HR 83 | Ht 61.0 in | Wt 156.6 lb

## 2023-07-06 DIAGNOSIS — M5441 Lumbago with sciatica, right side: Secondary | ICD-10-CM

## 2023-07-06 DIAGNOSIS — R253 Fasciculation: Secondary | ICD-10-CM | POA: Diagnosis not present

## 2023-07-06 DIAGNOSIS — M79604 Pain in right leg: Secondary | ICD-10-CM | POA: Diagnosis not present

## 2023-07-06 DIAGNOSIS — M25611 Stiffness of right shoulder, not elsewhere classified: Secondary | ICD-10-CM | POA: Diagnosis not present

## 2023-07-06 DIAGNOSIS — M5442 Lumbago with sciatica, left side: Secondary | ICD-10-CM

## 2023-07-06 DIAGNOSIS — M25411 Effusion, right shoulder: Secondary | ICD-10-CM | POA: Diagnosis not present

## 2023-07-06 DIAGNOSIS — G8929 Other chronic pain: Secondary | ICD-10-CM

## 2023-07-06 DIAGNOSIS — M6281 Muscle weakness (generalized): Secondary | ICD-10-CM | POA: Diagnosis not present

## 2023-07-06 DIAGNOSIS — M79605 Pain in left leg: Secondary | ICD-10-CM

## 2023-07-06 DIAGNOSIS — M25511 Pain in right shoulder: Secondary | ICD-10-CM | POA: Diagnosis not present

## 2023-07-06 NOTE — Progress Notes (Signed)
[facility]  PATIENT: Emily Santiago DOB: September 29, 1960  REFERRING CLINICIAN: Irven Coe, MD HISTORY FROM: patient  REASON FOR VISIT: new consult   HISTORICAL  CHIEF COMPLAINT:  Chief Complaint  Patient presents with   New Patient (Initial Visit)    Pt in room 6 alone.  New patient here for Arm and Leg twitching. Pt said twitching of hand and leg at night time only. Started 3-4 months ago, pt said symptoms had stopped for about 1 month now. Pt said about 3-4 month ago she received a foot injection in right foot due to a knot that was swollen, knot did not have fluid inside.    HISTORY OF PRESENT ILLNESS:   63 year old female here for evaluation of muscle twitching.    October 2024 patient noticed some right arm and right leg twitching movements.  These lasted for few weeks and then resolved.  In November 2024 patient had a fall, when she tripped over her dog's bed, resulting in right shoulder fracture.  Separately patient has had some generalized chronic low back pain, leg pain, restlessness in the legs.  Is been going on for many years.  She has not tried physical therapy.  Has not had MRI of the lumbar spine.  Pain radiates from the back down to her legs.  Symptoms worse when she has been sitting for a while and then stands up or walks.   REVIEW OF SYSTEMS: Full 14 system review of systems performed and negative with exception of: as per HPI.  ALLERGIES: Allergies  Allergen Reactions   Hydrocodone-Acetaminophen Anaphylaxis    Throat/tongue swells   Penicillins Nausea Only and Rash    Stomach upset Has patient had a PCN reaction causing immediate rash, facial/tongue/throat swelling, SOB or lightheadedness with hypotension: No Has patient had a PCN reaction causing severe rash involving mucus membranes or skin necrosis: Yes Has patient had a PCN reaction that required hospitalization: NO Has patient had a PCN reaction occurring within the last 10 years:  Yes If all of the above answers are "NO", then may proceed with Cephalosporin use.     Zestril [Lisinopril] Cough   Chlorthalidone     Other Reaction(s): hyponatremia   Latex Itching    HOME MEDICATIONS: Outpatient Medications Prior to Visit  Medication Sig Dispense Refill   amLODipine (NORVASC) 5 MG tablet Take 1.5 tablets (7.5 mg total) by mouth daily. 135 tablet 2   aspirin EC 81 MG tablet Take 1 tablet (81 mg total) by mouth daily. Swallow whole.     buPROPion (WELLBUTRIN SR) 150 MG 12 hr tablet Take 150 mg by mouth in the morning.     Cholecalciferol (DIALYVITE VITAMIN D 5000) 125 MCG (5000 UT) capsule Take 5,000 Units by mouth at bedtime.     Cyanocobalamin (VITAMIN B-12 PO) Take 1 tablet by mouth in the morning.     diclofenac (VOLTAREN) 50 MG EC tablet Take 50 mg by mouth in the morning.     esomeprazole (NEXIUM) 40 MG capsule Take 40 mg by mouth daily before breakfast.     fluticasone (FLONASE) 50 MCG/ACT nasal spray Place 1 spray into both nostrils daily as needed for allergies or rhinitis.     gabapentin (NEURONTIN) 300 MG capsule Take 1 capsule (300 mg total) by mouth 3 (three) times daily. In the morning and at bedtime 90 capsule 1   LORazepam (ATIVAN) 0.5 MG tablet Take 0.25 mg by mouth 2 (two) times daily as needed for anxiety.  Multiple Vitamin (MULTIVITAMIN WITH MINERALS) TABS tablet Take 1 tablet by mouth in the morning.     potassium chloride (KLOR-CON M) 10 MEQ tablet Take 10 mEq by mouth every other day. In the morning     rosuvastatin (CRESTOR) 10 MG tablet Take 10 mg by mouth every Monday, Wednesday, and Friday. In the morning     telmisartan (MICARDIS) 40 MG tablet Take 40 mg by mouth at bedtime.     traZODone (DESYREL) 50 MG tablet Take 50 mg by mouth at bedtime.     zolpidem (AMBIEN) 10 MG tablet Take 10 mg by mouth at bedtime as needed for sleep.     No facility-administered medications prior to visit.    PAST MEDICAL HISTORY: Past Medical History:   Diagnosis Date   ADHD (attention deficit hyperactivity disorder)    Allergies    Anxiety    Aortic atherosclerosis (HCC)    Arthritis    WRIST AND BACK   Asthma    Atrophic vaginitis    CKD (chronic kidney disease), stage II    Complication of anesthesia    slow to wake up   COPD (chronic obstructive pulmonary disease) (HCC)    pt denies this dx   COVID 2022   states it was a bad case, not hospitalized   COVID-19    Depression    Diverticulosis    Dyspnea    Emphysema/COPD (HCC)    Family history of adverse reaction to anesthesia    mother was hard to wake up, acted weird afterwards   Fibromyalgia    Gastric ulcer    GERD (gastroesophageal reflux disease)    History of Barrett's esophagus    History of head and neck cancer    History of kidney stones    Hx of colonic polyps    Hyperlipidemia    Hypertension    Hyponatremia    Insomnia    Lymphoma (HCC)    Lymphoma   Melanosis coli    Neck pain    Osteopenia    Osteoporosis    Plantar fasciitis    Tobacco abuse    Tubular adenoma of colon    Vallecular mass    Varicose veins    Vitamin D deficiency     PAST SURGICAL HISTORY: Past Surgical History:  Procedure Laterality Date   ABDOMINAL HYSTERECTOMY     APPENDECTOMY     BILATERAL SALPINGOOPHORECTOMY     BIOPSY  02/25/2022   Procedure: BIOPSY;  Surgeon: Sherrilyn Rist, MD;  Location: [facility] END[facility]COPY;  Service: Gastroenterology;;   BRONCHIAL BIOPSY  05/03/2022   Procedure: BRONCHIAL BIOPSIES;  Surgeon: Josephine Igo, DO;  Location: [facility];  Service: Pulmonary;;   BRONCHIAL BRUSHINGS  05/03/2022   Procedure: BRONCHIAL BRUSHINGS;  Surgeon: Josephine Igo, DO;  Location: [facility];  Service: Pulmonary;;   CHOLECYSTECTOMY     COLONOSCOPY WITH PROPOFOL N/A 02/25/2022   Procedure: COLONOSCOPY WITH PROPOFOL;  Surgeon: Sherrilyn Rist, MD;  Location: [facility];  Service: Gastroenterology;  Laterality: N/A;   ESOPHAGEAL MANOMETRY N/A 04/28/2016    Procedure: ESOPHAGEAL MANOMETRY (EM);  Surgeon: Napoleon Form, MD;  Location: [facility];  Service: Endoscopy;  Laterality: N/A;  dr. Myrtie Neither   ESOPHAGOGASTRODUODENOSCOPY (EGD) WITH PROPOFOL N/A 02/25/2022   Procedure: ESOPHAGOGASTRODUODENOSCOPY (EGD) WITH PROPOFOL;  Surgeon: Sherrilyn Rist, MD;  Location: [facility];  Service: Gastroenterology;  Laterality: N/A;   FIDUCIAL MARKER PLACEMENT  07/02/2022   Procedure: FIDUCIAL DYE MARKING;  Surgeon: Omar Person, MD;  Location: [facility];  Service: Pulmonary;;   KNEE ARTHROSCOPY     LYMPH NODE BIOPSY     TONSILLECTOMY     UPPER GASTROINTESTINAL ENDOSCOPY     WRIST SURGERY Right    XI ROBOTIC ASSISTED THORACOSCOPY- SEGMENTECTOMY Right 07/02/2022   Procedure: XI ROBOTIC ASSISTED THORACOSCOPY-RIGHT LOWER LOBE SEGMENTECTOMY;  Surgeon: Loreli Slot, MD;  Location: [facility] OR;  Service: Thoracic;  Laterality: Right;    FAMILY HISTORY: Family History  Problem Relation Age of Onset   Heart disease Father    Colon cancer Father        dx in his 58's   Diabetes Father        metastatic   Colon polyps Father    Heart disease Maternal Grandmother    Breast cancer Maternal Grandmother    Prostate cancer Maternal Uncle    Brain cancer Brother    Liver cancer Brother    Bone cancer Brother    Prostate cancer Brother    Hyperlipidemia Mother    Colon polyps Mother    Breast cancer Cousin        x3   Stomach cancer Neg Hx    Pancreatic cancer Neg Hx    Esophageal cancer Neg Hx    Rectal cancer Neg Hx     SOCIAL HISTORY: Social History   Socioeconomic History   Marital status: Divorced    Spouse name: Not on file   Number of children: 0   Years of education: Not on file   Highest education level: 11th grade  Occupational History   Occupation: Risk analyst  Tobacco Use   Smoking status: Former    Current packs/day: 0.00    Types: Cigarettes    Start date: 1983    Quit date: 2022    Years since quitting:  3.0   Smokeless tobacco: Never  Vaping Use   Vaping status: Never Used  Substance and Sexual Activity   Alcohol use: No   Drug use: No   Sexual activity: Not on file  Other Topics Concern   Not on file  Social History Narrative   Right handed   Wear glasses   1 cup of coffee          Lives with husband at home.   Social Drivers of Corporate investment banker Strain: Not on file  Food Insecurity: Not on file  Transportation Needs: Not on file  Physical Activity: Not on file  Stress: Not on file  Social Connections: Not on file  Intimate Partner Violence: Not on file     PHYSICAL EXAM  GENERAL EXAM/CONSTITUTIONAL: Vitals:  Vitals:   07/06/23 1152  BP: 137/87  Pulse: 83  Weight: 156 lb 9.6 oz (71 kg)  Height: 5\' 1"  (1.549 m)   Body mass index is 29.59 kg/m. Wt Readings from Last 3 Encounters:  07/06/23 156 lb 9.6 oz (71 kg)  05/04/23 157 lb (71.2 kg)  04/01/23 157 lb (71.2 kg)   Patient is in no distress; well developed, nourished and groomed; neck is supple  CARDIOVASCULAR: Examination of carotid arteries is normal; no carotid bruits Regular rate and rhythm, no murmurs Examination of peripheral vascular system by observation and palpation is normal  EYES: Ophthalmoscopic exam of optic discs and posterior segments is normal; no papilledema or hemorrhages No results found.  MUSCULOSKELETAL: Gait, strength, tone, movements noted in Neurologic exam below  NEUROLOGIC: MENTAL STATUS:      No  data to display         awake, alert, oriented to person, place and time recent and remote memory intact normal attention and concentration language fluent, comprehension intact, naming intact fund of knowledge appropriate  CRANIAL NERVE:  2nd - no papilledema on fundoscopic exam 2nd, 3rd, 4th, 6th - pupils equal and reactive to light, visual fields full to confrontation, extraocular muscles intact, no nystagmus 5th - facial sensation symmetric 7th - facial  strength symmetric 8th - hearing intact 9th - palate elevates symmetrically, uvula midline 11th - shoulder shrug symmetric 12th - tongue protrusion midline  MOTOR:  normal bulk and tone, full strength in the BUE, BLE EXCEPT LIMITED IN RIGHT SHOULDER DUE TO PAIN  SENSORY:  normal and symmetric to light touch, temperature, vibration  COORDINATION:  finger-nose-finger, fine finger movements normal  REFLEXES:  deep tendon reflexes TRACE and symmetric  GAIT/STATION:  narrow based gait     DIAGNOSTIC DATA (LABS, IMAGING, TESTING) - I reviewed patient records, labs, notes, testing and imaging myself where available.  Lab Results  Component Value Date   WBC 7.7 01/25/2023   HGB 13.2 01/25/2023   HCT 40.2 01/25/2023   MCV 93.1 01/25/2023   PLT 263 01/25/2023      Component Value Date/Time   NA 141 01/25/2023 1055   NA 137 04/22/2017 1407   K 4.3 01/25/2023 1055   K 5.0 04/22/2017 1407   CL 105 01/25/2023 1055   CO2 30 01/25/2023 1055   CO2 24 04/22/2017 1407   GLUCOSE 65 (L) 01/25/2023 1055   GLUCOSE 77 04/22/2017 1407   BUN 17 01/25/2023 1055   BUN 21.6 04/22/2017 1407   CREATININE 0.61 01/25/2023 1055   CREATININE 0.8 04/22/2017 1407   CALCIUM 9.3 01/25/2023 1055   CALCIUM 9.5 04/22/2017 1407   PROT 6.8 01/25/2023 1055   PROT 7.1 04/22/2017 1407   ALBUMIN 4.2 01/25/2023 1055   ALBUMIN 3.9 04/22/2017 1407   AST 13 (L) 01/25/2023 1055   AST 18 04/22/2017 1407   ALT 12 01/25/2023 1055   ALT 16 04/22/2017 1407   ALKPHOS 87 01/25/2023 1055   ALKPHOS 91 04/22/2017 1407   BILITOT 0.5 01/25/2023 1055   BILITOT 0.55 04/22/2017 1407   GFRNONAA >60 01/25/2023 1055   GFRAA >60 12/31/2019 0938   Lab Results  Component Value Date   CHOL 184 05/11/2023   HDL 63 05/11/2023   LDLCALC 108 (H) 05/11/2023   TRIG 67 05/11/2023   CHOLHDL 2.9 05/11/2023   No results found for: "HGBA1C" No results found for: "VITAMINB12" Lab Results  Component Value Date   TSH 1.240  03/23/2018      ASSESSMENT AND PLAN  63 y.o. year old female here with:   Dx:  1. Chronic bilateral low back pain with bilateral sciatica   2. Pain in both lower extremities   3. Muscle twitch     PLAN:  CHRONIC BACK PAIN, LEG PAIN, RESTLESSNESS - refer to PT evaluation x 6 weeks - if not improving, then check MRI lumbar spine; then consider neuropathy labs - in future could consider gabapentin, pregabalin, duloxetine, or dopamine agonists  Orders Placed This Encounter  Procedures   Ambulatory referral to Physical Therapy   Return for pending if symptoms worsen or fail to improve, pending test results.    Suanne Marker, MD 07/06/2023, 12:48 PM Certified in Neurology, Neurophysiology and Neuroimaging  [facility] [address] [place], [place] [address] [phone]

## 2023-07-07 ENCOUNTER — Telehealth: Payer: Self-pay | Admitting: Diagnostic Neuroimaging

## 2023-07-07 NOTE — Telephone Encounter (Signed)
Referral for physical therapy fax to [facility] as requested. Phone: [phone], Fax: [phone]

## 2023-07-13 DIAGNOSIS — G2581 Restless legs syndrome: Secondary | ICD-10-CM | POA: Diagnosis not present

## 2023-07-13 DIAGNOSIS — I10 Essential (primary) hypertension: Secondary | ICD-10-CM | POA: Diagnosis not present

## 2023-07-13 DIAGNOSIS — C8590 Non-Hodgkin lymphoma, unspecified, unspecified site: Secondary | ICD-10-CM | POA: Diagnosis not present

## 2023-07-13 DIAGNOSIS — E7800 Pure hypercholesterolemia, unspecified: Secondary | ICD-10-CM | POA: Diagnosis not present

## 2023-07-18 DIAGNOSIS — S42211A Unspecified displaced fracture of surgical neck of right humerus, initial encounter for closed fracture: Secondary | ICD-10-CM | POA: Diagnosis not present

## 2023-07-20 ENCOUNTER — Encounter: Payer: BC Managed Care – PPO | Attending: Cardiology | Admitting: Skilled Nursing Facility1

## 2023-07-20 DIAGNOSIS — E7800 Pure hypercholesterolemia, unspecified: Secondary | ICD-10-CM | POA: Diagnosis not present

## 2023-07-20 DIAGNOSIS — M6281 Muscle weakness (generalized): Secondary | ICD-10-CM | POA: Diagnosis not present

## 2023-07-20 DIAGNOSIS — M25411 Effusion, right shoulder: Secondary | ICD-10-CM | POA: Diagnosis not present

## 2023-07-20 DIAGNOSIS — M25611 Stiffness of right shoulder, not elsewhere classified: Secondary | ICD-10-CM | POA: Diagnosis not present

## 2023-07-20 DIAGNOSIS — M25511 Pain in right shoulder: Secondary | ICD-10-CM | POA: Diagnosis not present

## 2023-07-20 NOTE — Progress Notes (Signed)
 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  Pt states she is out of work due to injury Pt states she tries not to use a lot of grease when cooking Pt states she avoids ice cream , chips , cookies Pt states she is very stressed due to life circumstances  Pt states she lacks motivation to exercise or go out   Estimated daily fluid intake: not assessed   Supplements: not assessed Sleep: poor sleep  Stress / self-care: high  stress  Current average weekly physical activity: none lately  24-Hr Dietary Recall First Meal: 2 eggs or a bowl of wish protein cereal or oatmeal  Snack:  Second Meal: grilled chicken or pork chop with vegetables  Snack:  Third Meal:  Snack: mixed nuts  Beverages:    NUTRITION DIAGNOSIS  Nutrition and Food related Knowledge deficit related to exposure to inaccurate nutrition-related information as evidence by patient report of being told not to eat the same vegetable too much because it will raise her cholesterol lab values    NUTRITION INTERVENTION  Nutrition education (E-1) on the following topics:  Educated patient on balanced plate Educated patient on how vegetable consumption will affect her cholesterol Educated patient on how  to read a food label   Handouts Provided Include  Balance plate handout   Learning Style & Readiness for Change Teaching method utilized: Visual & Auditory   Demonstrated degree of understanding via: Teach Back  Barriers to learning/adherence to lifestyle change: Stress/lack of motivation, broken elbow, husband going through chemo  Goals Established by Pt I will make an effort to go for walks again    MONITORING & EVALUATION Dietary intake  Next Steps  No follow-up scheduled patient was told by Dietitian to call office to schedule an appointment as needed

## 2023-07-21 ENCOUNTER — Encounter: Payer: Self-pay | Admitting: Skilled Nursing Facility1

## 2023-07-27 DIAGNOSIS — M25411 Effusion, right shoulder: Secondary | ICD-10-CM | POA: Diagnosis not present

## 2023-07-27 DIAGNOSIS — M25511 Pain in right shoulder: Secondary | ICD-10-CM | POA: Diagnosis not present

## 2023-07-27 DIAGNOSIS — M6281 Muscle weakness (generalized): Secondary | ICD-10-CM | POA: Diagnosis not present

## 2023-07-27 DIAGNOSIS — M25611 Stiffness of right shoulder, not elsewhere classified: Secondary | ICD-10-CM | POA: Diagnosis not present

## 2023-07-29 DIAGNOSIS — K219 Gastro-esophageal reflux disease without esophagitis: Secondary | ICD-10-CM | POA: Diagnosis not present

## 2023-08-03 ENCOUNTER — Ambulatory Visit (HOSPITAL_COMMUNITY)
Admission: RE | Admit: 2023-08-03 | Discharge: 2023-08-03 | Disposition: A | Discharge status: Home / Self Care | Payer: BC Managed Care – PPO | Source: Ambulatory Visit | Attending: Physician Assistant | Admitting: Physician Assistant

## 2023-08-03 DIAGNOSIS — I700 Atherosclerosis of aorta: Secondary | ICD-10-CM | POA: Diagnosis not present

## 2023-08-03 DIAGNOSIS — C3490 Malignant neoplasm of unspecified part of unspecified bronchus or lung: Secondary | ICD-10-CM | POA: Diagnosis not present

## 2023-08-03 DIAGNOSIS — J439 Emphysema, unspecified: Secondary | ICD-10-CM | POA: Diagnosis not present

## 2023-08-03 DIAGNOSIS — C3411 Malignant neoplasm of upper lobe, right bronchus or lung: Secondary | ICD-10-CM | POA: Diagnosis not present

## 2023-08-03 MED ORDER — IOHEXOL 300 MG/ML  SOLN
75.0000 mL | Freq: Once | INTRAMUSCULAR | Status: AC | PRN
  Administered 2023-08-03: 75 mL via INTRAVENOUS

## 2023-08-04 ENCOUNTER — Inpatient Hospital Stay: Payer: BC Managed Care – PPO | Attending: Internal Medicine

## 2023-08-04 DIAGNOSIS — Z902 Acquired absence of lung [part of]: Secondary | ICD-10-CM | POA: Insufficient documentation

## 2023-08-04 DIAGNOSIS — R918 Other nonspecific abnormal finding of lung field: Secondary | ICD-10-CM | POA: Insufficient documentation

## 2023-08-04 DIAGNOSIS — Z87891 Personal history of nicotine dependence: Secondary | ICD-10-CM | POA: Diagnosis not present

## 2023-08-04 DIAGNOSIS — Z85118 Personal history of other malignant neoplasm of bronchus and lung: Secondary | ICD-10-CM | POA: Diagnosis not present

## 2023-08-04 DIAGNOSIS — C3490 Malignant neoplasm of unspecified part of unspecified bronchus or lung: Secondary | ICD-10-CM

## 2023-08-04 LAB — CBC WITH DIFFERENTIAL (CANCER CENTER ONLY)
Abs Immature Granulocytes: 0.02 10*3/uL (ref 0.00–0.07)
Basophils Absolute: 0 10*3/uL (ref 0.0–0.1)
Basophils Relative: 0 %
Eosinophils Absolute: 0.2 10*3/uL (ref 0.0–0.5)
Eosinophils Relative: 2 %
HCT: 41.6 % (ref 36.0–46.0)
Hemoglobin: 13.6 g/dL (ref 12.0–15.0)
Immature Granulocytes: 0 %
Lymphocytes Relative: 24 %
Lymphs Abs: 1.5 10*3/uL (ref 0.7–4.0)
MCH: 30.4 pg (ref 26.0–34.0)
MCHC: 32.7 g/dL (ref 30.0–36.0)
MCV: 93.1 fL (ref 80.0–100.0)
Monocytes Absolute: 0.4 10*3/uL (ref 0.1–1.0)
Monocytes Relative: 7 %
Neutro Abs: 4.4 10*3/uL (ref 1.7–7.7)
Neutrophils Relative %: 67 %
Platelet Count: 227 10*3/uL (ref 150–400)
RBC: 4.47 MIL/uL (ref 3.87–5.11)
RDW: 12.6 % (ref 11.5–15.5)
WBC Count: 6.5 10*3/uL (ref 4.0–10.5)
nRBC: 0 % (ref 0.0–0.2)

## 2023-08-04 LAB — CMP (CANCER CENTER ONLY)
ALT: 11 U/L (ref 0–44)
AST: 14 U/L — ABNORMAL LOW (ref 15–41)
Albumin: 4.3 g/dL (ref 3.5–5.0)
Alkaline Phosphatase: 90 U/L (ref 38–126)
Anion gap: 6 (ref 5–15)
BUN: 16 mg/dL (ref 8–23)
CO2: 28 mmol/L (ref 22–32)
Calcium: 9.2 mg/dL (ref 8.9–10.3)
Chloride: 102 mmol/L (ref 98–111)
Creatinine: 0.6 mg/dL (ref 0.44–1.00)
GFR, Estimated: >60 mL/min (ref >60)
Glucose, Bld: 82 mg/dL (ref 70–99)
Potassium: 4.6 mmol/L (ref 3.5–5.1)
Sodium: 136 mmol/L (ref 135–145)
Total Bilirubin: 0.4 mg/dL (ref 0.0–1.2)
Total Protein: 6.7 g/dL (ref 6.5–8.1)

## 2023-08-05 ENCOUNTER — Other Ambulatory Visit (HOSPITAL_COMMUNITY): Payer: BC Managed Care – PPO

## 2023-08-05 ENCOUNTER — Inpatient Hospital Stay: Payer: BC Managed Care – PPO

## 2023-08-05 DIAGNOSIS — M25411 Effusion, right shoulder: Secondary | ICD-10-CM | POA: Diagnosis not present

## 2023-08-05 DIAGNOSIS — M25611 Stiffness of right shoulder, not elsewhere classified: Secondary | ICD-10-CM | POA: Diagnosis not present

## 2023-08-05 DIAGNOSIS — M25511 Pain in right shoulder: Secondary | ICD-10-CM | POA: Diagnosis not present

## 2023-08-05 DIAGNOSIS — M6281 Muscle weakness (generalized): Secondary | ICD-10-CM | POA: Diagnosis not present

## 2023-08-09 DIAGNOSIS — M25511 Pain in right shoulder: Secondary | ICD-10-CM | POA: Diagnosis not present

## 2023-08-09 DIAGNOSIS — M25411 Effusion, right shoulder: Secondary | ICD-10-CM | POA: Diagnosis not present

## 2023-08-09 DIAGNOSIS — M6281 Muscle weakness (generalized): Secondary | ICD-10-CM | POA: Diagnosis not present

## 2023-08-09 DIAGNOSIS — M25611 Stiffness of right shoulder, not elsewhere classified: Secondary | ICD-10-CM | POA: Diagnosis not present

## 2023-08-11 ENCOUNTER — Inpatient Hospital Stay: Payer: BC Managed Care – PPO | Admitting: Internal Medicine

## 2023-08-11 VITALS — BP 160/71 | HR 72 | Temp 97.3°F | Resp 15 | Ht 61.0 in | Wt 152.3 lb

## 2023-08-11 DIAGNOSIS — Z85118 Personal history of other malignant neoplasm of bronchus and lung: Secondary | ICD-10-CM | POA: Diagnosis not present

## 2023-08-11 DIAGNOSIS — C3490 Malignant neoplasm of unspecified part of unspecified bronchus or lung: Secondary | ICD-10-CM

## 2023-08-11 DIAGNOSIS — Z902 Acquired absence of lung [part of]: Secondary | ICD-10-CM | POA: Diagnosis not present

## 2023-08-11 DIAGNOSIS — R918 Other nonspecific abnormal finding of lung field: Secondary | ICD-10-CM | POA: Diagnosis not present

## 2023-08-11 DIAGNOSIS — Z87891 Personal history of nicotine dependence: Secondary | ICD-10-CM | POA: Diagnosis not present

## 2023-08-11 NOTE — Progress Notes (Unsigned)
 [facility] [phone]   [phone]  OFFICE PROGRESS NOTE  Irven Coe, MD [address] [place] [address]  DIAGNOSIS: Stage IA (T1 a, N0, M0) non-small cell lung cancer, adenocarcinoma presented with right lower lobe lung nodule.  with tumor size of 0.8 cm and no evidence for visceral pleural or lymphovascular invasion and the dissected lymph nodes were negative for malignancy.    PRIOR THERAPY: status post right lower lobe superior segmentectomy under the care of Dr. Dorris Fetch on July 02, 2022.    CURRENT THERAPY: Observation.   INTERVAL HISTORY: Emily Santiago 63 y.o. female returns to the clinic today for 100-month follow-up visit.Discussed the use of AI scribe software for clinical note transcription with the patient, who gave verbal consent to proceed.  History of Present Illness   [name] is a 63 year old female with stage one non-small cell lung cancer, adenocarcinoma, who presents for routine follow-up.  Diagnosed with stage one non-small cell lung cancer, adenocarcinoma, in January 2024, she underwent a right lower lobe superior segmentectomy to remove the cancerous tissue.  Her recent imaging shows a stable nodule in the inferior right middle lobe, which has increased slightly from five millimeters in December to six millimeters, with no signs of local recurrence or metastatic disease.  Since her surgery, she has been under regular surveillance, with her last visit occurring six months ago. She reports no new symptoms.      MEDICAL HISTORY: Past Medical History:  Diagnosis Date   ADHD (attention deficit hyperactivity disorder)    Allergies    Anxiety    Aortic atherosclerosis (HCC)    Arthritis    WRIST AND BACK   Asthma    Atrophic vaginitis    CKD (chronic kidney disease), stage II    Complication of anesthesia    slow to wake up   COPD (chronic obstructive pulmonary disease) (HCC)     pt denies this dx   COVID 2022   states it was a bad case, not hospitalized   COVID-19    Depression    Diverticulosis    Dyspnea    Emphysema/COPD (HCC)    Family history of adverse reaction to anesthesia    mother was hard to wake up, acted weird afterwards   Fibromyalgia    Gastric ulcer    GERD (gastroesophageal reflux disease)    History of Barrett's esophagus    History of head and neck cancer    History of kidney stones    Hx of colonic polyps    Hyperlipidemia    Hypertension    Hyponatremia    Insomnia    Lymphoma (HCC)    Lymphoma   Melanosis coli    Neck pain    Osteopenia    Osteoporosis    Plantar fasciitis    Tobacco abuse    Tubular adenoma of colon    Vallecular mass    Varicose veins    Vitamin D deficiency     ALLERGIES:  is allergic to hydrocodone-acetaminophen, penicillins, zestril [lisinopril], chlorthalidone, and latex.  MEDICATIONS:  Current Outpatient Medications  Medication Sig Dispense Refill   amLODipine (NORVASC) 5 MG tablet Take 1.5 tablets (7.5 mg total) by mouth daily. 135 tablet 2   aspirin EC 81 MG tablet Take 1 tablet (81 mg total) by mouth daily. Swallow whole.     buPROPion (WELLBUTRIN SR) 150 MG 12 hr tablet Take 150  mg by mouth in the morning.     Cholecalciferol (DIALYVITE VITAMIN D 5000) 125 MCG (5000 UT) capsule Take 5,000 Units by mouth at bedtime.     Cyanocobalamin (VITAMIN B-12 PO) Take 1 tablet by mouth in the morning.     diclofenac (VOLTAREN) 50 MG EC tablet Take 50 mg by mouth in the morning.     esomeprazole (NEXIUM) 40 MG capsule Take 40 mg by mouth daily before breakfast.     fluticasone (FLONASE) 50 MCG/ACT nasal spray Place 1 spray into both nostrils daily as needed for allergies or rhinitis.     gabapentin (NEURONTIN) 300 MG capsule Take 1 capsule (300 mg total) by mouth 3 (three) times daily. In the morning and at bedtime 90 capsule 1   LORazepam (ATIVAN) 0.5 MG tablet Take 0.25 mg by mouth 2 (two) times daily as  needed for anxiety.     Multiple Vitamin (MULTIVITAMIN WITH MINERALS) TABS tablet Take 1 tablet by mouth in the morning.     potassium chloride (KLOR-CON M) 10 MEQ tablet Take 10 mEq by mouth every other day. In the morning     rosuvastatin (CRESTOR) 10 MG tablet Take 10 mg by mouth every Monday, Wednesday, and Friday. In the morning     telmisartan (MICARDIS) 40 MG tablet Take 40 mg by mouth at bedtime.     traZODone (DESYREL) 50 MG tablet Take 50 mg by mouth at bedtime.     zolpidem (AMBIEN) 10 MG tablet Take 10 mg by mouth at bedtime as needed for sleep.     No current facility-administered medications for this visit.    SURGICAL HISTORY:  Past Surgical History:  Procedure Laterality Date   ABDOMINAL HYSTERECTOMY     APPENDECTOMY     BILATERAL SALPINGOOPHORECTOMY     BIOPSY  02/25/2022   Procedure: BIOPSY;  Surgeon: Sherrilyn Rist, MD;  Location: [facility];  Service: Gastroenterology;;   BRONCHIAL BIOPSY  05/03/2022   Procedure: BRONCHIAL BIOPSIES;  Surgeon: Josephine Igo, DO;  Location: [facility];  Service: Pulmonary;;   BRONCHIAL BRUSHINGS  05/03/2022   Procedure: BRONCHIAL BRUSHINGS;  Surgeon: Josephine Igo, DO;  Location: [facility];  Service: Pulmonary;;   CHOLECYSTECTOMY     COLONOSCOPY WITH PROPOFOL N/A 02/25/2022   Procedure: COLONOSCOPY WITH PROPOFOL;  Surgeon: Sherrilyn Rist, MD;  Location: [facility];  Service: Gastroenterology;  Laterality: N/A;   ESOPHAGEAL MANOMETRY N/A 04/28/2016   Procedure: ESOPHAGEAL MANOMETRY (EM);  Surgeon: Napoleon Form, MD;  Location: [facility];  Service: Endoscopy;  Laterality: N/A;  dr. Myrtie Neither   ESOPHAGOGASTRODUODENOSCOPY (EGD) WITH PROPOFOL N/A 02/25/2022   Procedure: ESOPHAGOGASTRODUODENOSCOPY (EGD) WITH PROPOFOL;  Surgeon: Sherrilyn Rist, MD;  Location: [facility];  Service: Gastroenterology;  Laterality: N/A;   FIDUCIAL MARKER PLACEMENT  07/02/2022   Procedure: FIDUCIAL DYE MARKING;  Surgeon: Omar Person, MD;  Location: [facility] ENDOSCOPY;  Service: Pulmonary;;   KNEE ARTHROSCOPY     LYMPH NODE BIOPSY     TONSILLECTOMY     UPPER GASTROINTESTINAL ENDOSCOPY     WRIST SURGERY Right    XI ROBOTIC ASSISTED THORACOSCOPY- SEGMENTECTOMY Right 07/02/2022   Procedure: XI ROBOTIC ASSISTED THORACOSCOPY-RIGHT LOWER LOBE SEGMENTECTOMY;  Surgeon: Loreli Slot, MD;  Location: [facility] OR;  Service: Thoracic;  Laterality: Right;    REVIEW OF SYSTEMS:  A comprehensive review of systems was negative except for: Constitutional: positive for fatigue   PHYSICAL EXAMINATION: General appearance: alert, cooperative, and no  distress Head: Normocephalic, without obvious abnormality, atraumatic Neck: no adenopathy, no JVD, supple, symmetrical, trachea midline, and thyroid not enlarged, symmetric, no tenderness/mass/nodules Lymph nodes: Cervical, supraclavicular, and axillary nodes normal. Resp: clear to auscultation bilaterally Back: symmetric, no curvature. ROM normal. No CVA tenderness. Cardio: regular rate and rhythm, S1, S2 normal, no murmur, click, rub or gallop GI: soft, non-tender; bowel sounds normal; no masses,  no organomegaly Extremities: extremities normal, atraumatic, no cyanosis or edema  ECOG PERFORMANCE STATUS: 1 - Symptomatic but completely ambulatory  Blood pressure (!) 160/71, pulse 72, temperature (!) 97.3 F (36.3 C), temperature source Temporal, resp. rate 15, height 5\' 1"  (1.549 m), weight 152 lb 4.8 oz (69.1 kg), SpO2 97%.  LABORATORY DATA: Lab Results  Component Value Date   WBC 6.5 08/04/2023   HGB 13.6 08/04/2023   HCT 41.6 08/04/2023   MCV 93.1 08/04/2023   PLT 227 08/04/2023      Chemistry      Component Value Date/Time   NA 136 08/04/2023 1445   NA 137 04/22/2017 1407   K 4.6 08/04/2023 1445   K 5.0 04/22/2017 1407   CL 102 08/04/2023 1445   CO2 28 08/04/2023 1445   CO2 24 04/22/2017 1407   BUN 16 08/04/2023 1445   BUN 21.6 04/22/2017 1407   CREATININE 0.60  08/04/2023 1445   CREATININE 0.8 04/22/2017 1407      Component Value Date/Time   CALCIUM 9.2 08/04/2023 1445   CALCIUM 9.5 04/22/2017 1407   ALKPHOS 90 08/04/2023 1445   ALKPHOS 91 04/22/2017 1407   AST 14 (L) 08/04/2023 1445   AST 18 04/22/2017 1407   ALT 11 08/04/2023 1445   ALT 16 04/22/2017 1407   BILITOT 0.4 08/04/2023 1445   BILITOT 0.55 04/22/2017 1407       RADIOGRAPHIC STUDIES: CT Chest W Contrast Result Date: 08/11/2023 CLINICAL DATA:  Non-small cell lung cancer. Restaging. * Tracking Code: BO * EXAM: CT CHEST WITH CONTRAST TECHNIQUE: Multidetector CT imaging of the chest was performed during intravenous contrast administration. RADIATION DOSE REDUCTION: This exam was performed according to the departmental dose-optimization program which includes automated exposure control, adjustment of the mA and/or kV according to patient size and/or use of iterative reconstruction technique. CONTRAST:  75mL OMNIPAQUE IOHEXOL 300 MG/ML  SOLN COMPARISON:  01/25/2023 FINDINGS: Cardiovascular: Heart size appears normal. No pericardial effusion. Aortic atherosclerosis and coronary artery calcifications. Mediastinum/Nodes: No enlarged mediastinal, hilar, or axillary lymph nodes. Cluster of prominent, subcentimeter left supraclavicular lymph nodes are stable, image 12/2. Thyroid gland, trachea, and esophagus demonstrate no significant findings. Lungs/Pleura: No pleural effusion. Postoperative changes from right lower lobe segmentectomy. Emphysema. No pleural effusion, airspace consolidation or pneumothorax. Tiny peripheral nodules within the basilar aspect of the right upper lobe are again noted some of these have a tree-in-bud configuration. These appears stable from the prior exam, likely postinflammatory or infectious. Subpleural nodule within the anterior aspect of the inferior right middle lobe measures 6 mm, image 106/8. On 05/18/2023 this measured 5 mm. Upper Abdomen: No acute abnormality. Small  enhancing lesion within the anterior aspect of segment 4 is stable measuring 5 mm. Although technically too small to characterize this is favored to represent a benign abnormality such as a flash fill hemangioma. Musculoskeletal: Fracture involving the proximal right humerus is again noted as seen on 05/18/2023. Fracture lines are less distinct and there is signs of overlying callus formation. No acute or suspicious osseous findings. No chest wall mass identified. IMPRESSION: 1. Postoperative changes  from right lower lobe segmentectomy. No specific findings identified to suggest local recurrence or metastatic disease. 2. Nonspecific, subpleural nodule within the anterior aspect of the inferior right middle lobe measures 6 mm. On 05/18/2023 this measured 5 mm. Attention in this nodule on follow-up imaging is advised. 3. Tiny peripheral nodules within the basilar aspect of the right upper lobe are again noted some of these have a tree-in-bud configuration. These appears stable from the prior exam, likely postinflammatory or infectious. 4. Healing fracture involving the proximal right humerus. 5. Coronary artery calcifications. 6. Aortic Atherosclerosis (ICD10-I70.0) and Emphysema (ICD10-J43.9). Electronically Signed   By: Signa Kell M.D.   On: 08/11/2023 10:37    ASSESSMENT AND PLAN: This is a very pleasant 63 years old white female with Stage IA (T1 a, N0, M0) non-small cell lung cancer, adenocarcinoma presented with right lower lobe lung nodule.  with tumor size of 0.8 cm and no evidence for visceral pleural or lymphovascular invasion and the dissected lymph nodes were negative for malignancy.  She is status post right lower lobe superior segmentectomy under the care of Dr. Dorris Fetch on July 02, 2022.  The patient is currently on observation and she is feeling fine. She had repeat CT scan of the chest performed recently.  I personally and independently reviewed the scan and discussed the result with  the patient today.    Stage I Non-Small Cell Lung Cancer (Adenocarcinoma) Diagnosed January 2024. Underwent right lower lobe superior segmentectomy. Follow-up scans show no local recurrence or metastatic disease. A 6 mm nodule in the inferior right middle lobe, previously 5 mm, is noted but considered nonspecific and stable. Explained that the size change is within radiological variation and not concerning. Continued surveillance is necessary. - Continue surveillance with lab and scan in six months - Repeat surveillance every six months for two more times, then annually  General Health Maintenance No specific issues beyond cancer surveillance. - Continue routine health maintenance as per standard guidelines  Follow-up - Schedule follow-up appointment in six months - Perform lab and scan at next follow-up.   She was advised to call immediately if she has any other concerning symptoms in the interval. The patient voices understanding of current disease status and treatment options and is in agreement with the current care plan.  All questions were answered. The patient knows to call the clinic with any problems, questions or concerns. We can certainly see the patient much sooner if necessary.  The total time spent in the appointment was 20 minutes.  Disclaimer: This note was dictated with voice recognition software. Similar sounding words can inadvertently be transcribed and may not be corrected upon review.

## 2023-08-15 DIAGNOSIS — S42254A Nondisplaced fracture of greater tuberosity of right humerus, initial encounter for closed fracture: Secondary | ICD-10-CM | POA: Diagnosis not present

## 2023-08-16 DIAGNOSIS — M25511 Pain in right shoulder: Secondary | ICD-10-CM | POA: Diagnosis not present

## 2023-08-16 DIAGNOSIS — M25411 Effusion, right shoulder: Secondary | ICD-10-CM | POA: Diagnosis not present

## 2023-08-16 DIAGNOSIS — M6281 Muscle weakness (generalized): Secondary | ICD-10-CM | POA: Diagnosis not present

## 2023-08-16 DIAGNOSIS — M25611 Stiffness of right shoulder, not elsewhere classified: Secondary | ICD-10-CM | POA: Diagnosis not present

## 2023-08-17 ENCOUNTER — Ambulatory Visit: Payer: BC Managed Care – PPO | Admitting: Pulmonary Disease

## 2023-08-17 DIAGNOSIS — I10 Essential (primary) hypertension: Secondary | ICD-10-CM | POA: Diagnosis not present

## 2023-08-19 DIAGNOSIS — E7800 Pure hypercholesterolemia, unspecified: Secondary | ICD-10-CM | POA: Diagnosis not present

## 2023-08-19 DIAGNOSIS — I10 Essential (primary) hypertension: Secondary | ICD-10-CM | POA: Diagnosis not present

## 2023-08-19 DIAGNOSIS — F419 Anxiety disorder, unspecified: Secondary | ICD-10-CM | POA: Diagnosis not present

## 2023-08-19 DIAGNOSIS — J449 Chronic obstructive pulmonary disease, unspecified: Secondary | ICD-10-CM | POA: Diagnosis not present

## 2023-08-23 DIAGNOSIS — M25511 Pain in right shoulder: Secondary | ICD-10-CM | POA: Diagnosis not present

## 2023-08-23 DIAGNOSIS — M25611 Stiffness of right shoulder, not elsewhere classified: Secondary | ICD-10-CM | POA: Diagnosis not present

## 2023-08-23 DIAGNOSIS — M6281 Muscle weakness (generalized): Secondary | ICD-10-CM | POA: Diagnosis not present

## 2023-08-23 DIAGNOSIS — M25411 Effusion, right shoulder: Secondary | ICD-10-CM | POA: Diagnosis not present

## 2023-09-07 DIAGNOSIS — M25411 Effusion, right shoulder: Secondary | ICD-10-CM | POA: Diagnosis not present

## 2023-09-07 DIAGNOSIS — M25511 Pain in right shoulder: Secondary | ICD-10-CM | POA: Diagnosis not present

## 2023-09-07 DIAGNOSIS — M25611 Stiffness of right shoulder, not elsewhere classified: Secondary | ICD-10-CM | POA: Diagnosis not present

## 2023-09-07 DIAGNOSIS — M6281 Muscle weakness (generalized): Secondary | ICD-10-CM | POA: Diagnosis not present

## 2023-10-13 ENCOUNTER — Ambulatory Visit: Admitting: Pulmonary Disease

## 2023-10-13 ENCOUNTER — Encounter: Payer: Self-pay | Admitting: Pulmonary Disease

## 2023-10-13 VITALS — BP 160/90 | HR 79 | Ht 61.0 in | Wt 156.8 lb

## 2023-10-13 DIAGNOSIS — C3431 Malignant neoplasm of lower lobe, right bronchus or lung: Secondary | ICD-10-CM | POA: Diagnosis not present

## 2023-10-13 DIAGNOSIS — R911 Solitary pulmonary nodule: Secondary | ICD-10-CM

## 2023-10-13 DIAGNOSIS — R0609 Other forms of dyspnea: Secondary | ICD-10-CM

## 2023-10-13 NOTE — Progress Notes (Signed)
 Patient ID: Emily Santiago, female    DOB: 02/03/1961, 63 y.o.   MRN: [id number]  Chief Complaint  Patient presents with   Follow-up    Patient states she having sob on activity or . Started 3-4 months ago    Referring provider: Benedetto Brady, MD  HPI:   Emily Santiago is a 63 y.o. woman with PMH tobacco abuse, emphysema, HTN whom we are seeing in follow up of cough, DOE, lung nodule with diagnosis of adenocarcinoma following robotic navigational bronchoscopy 05/03/2022 s/p VATS 07/02/2022.  Recent oncology office note reviewed.  More distally over the last 3 to 4 months.  Coincides with although not totally consistent with timeframe of husband's cancer treatment.  A lot of stress.  Blood pressure is elevated today.  She feels like is due to this.  CT scan interim for surveillance after resection of lung cancer 08/2023 clear without significant changes.  We reviewed PFTs in the past are normal.  And stable over time.  I think is unlikely some of the lungs are contributing.  If so it could be asthma.  She is on Breztri.  We discussed this in great medication, we do not have a lot to add.  Reviewed her echocardiogram done last fall with well overall normal does report RV dysfunction.  Discussed I will message cardiologist, see if they can get back in for another evaluation.  HPI at initial visit: History obtained per patient and mother at bedside who frequently interjects.  Patient notes cough is actually been present for many years.  She thinks cough is not worsened over the last 6 months or so.  In the past, she attributed to her smoking.  She reportedly has had many episodes of bronchitis. She has been told she has COPD based on her symptoms and smoking. Never had PFTs. Requires prednisone everythime she has bronchitis. At least once a year.  2 courses this calendar year already to date.   Cough is daily. Worse when eating and drinking. Gets choked on water. Endorses sensation of solids  getting stuck in throat. Cough worse when lying supine. Cough is non-productive. Endorses occasional heartburn despite PPI and H2 blocker. Endorses refractory reflux. Reports she has a hiatal hernia. Had curse of abx with no improvement. Uses Breo and thinks helps cough mildly and gives a little more breath.  She is an every day smoker. 2 ppd for about 40 years. Interested in quitting but seems more pre-contemplative at the moment.   Prior imaging reviewed and interpreted as: CXR 10/2019 - hyperinflated, no infiltrate; CT chest 03/2018 - emphysema, clear lungs.   PMH: HTN, GERD, emphysema Surgical history: Endoscopy, hysterectomy, cholecystectomy Family History: CAD, COPD in father, COPD in brother Social History: every day smoker, lives in [place]  Questionaires / Pulmonary Flowsheets:   ACT:      No data to display          MMRC:     No data to display          Epworth:      No data to display          Tests:   FENO:  No results found for: "NITRICOXIDE"  PFT:    Latest Ref Rng & Units 06/29/2022   10:29 AM 03/26/2020    2:56 PM  PFT Results  FVC-Pre L 2.37  P 2.21   FVC-Predicted Pre % 85  P 74   FVC-Post L 2.54  P 2.49  FVC-Predicted Post % 91  P 84   Pre FEV1/FVC % % 69  P 70   Post FEV1/FCV % % 71  P 72   FEV1-Pre L 1.64  P 1.55   FEV1-Predicted Pre % 77  P 67   FEV1-Post L 1.79  P 1.78   DLCO uncorrected ml/min/mmHg 15.14  P 14.63   DLCO UNC% % 86  P 80   DLCO corrected ml/min/mmHg  14.63   DLCO COR %Predicted %  80   DLVA Predicted % 89  P 81   TLC L 4.53  P 4.73   TLC % Predicted % 101  P 102   RV % Predicted % 114  P 136     P Preliminary result  Personally reviewed and interpreted as spirometry digestive restriction versus air trapping, significant bronchodilator response after all.  Lung volumes within normal limits with the exception of evidence of air trapping.  DLCO within normal limits.  WALK:      No data to display           Imaging: Personally reviewed and as per EMR discussion this note No results found.  Lab Results: Personally reviewed CBC    Component Value Date/Time   WBC 6.5 08/04/2023 1445   WBC 6.6 07/29/2022 1119   RBC 4.47 08/04/2023 1445   HGB 13.6 08/04/2023 1445   HGB 13.4 04/22/2017 1407   HCT 41.6 08/04/2023 1445   HCT 40.5 04/22/2017 1407   PLT 227 08/04/2023 1445   PLT 219 04/22/2017 1407   MCV 93.1 08/04/2023 1445   MCV 95.1 04/22/2017 1407   MCH 30.4 08/04/2023 1445   MCHC 32.7 08/04/2023 1445   RDW 12.6 08/04/2023 1445   RDW 13.4 04/22/2017 1407   LYMPHSABS 1.5 08/04/2023 1445   LYMPHSABS 1.7 04/22/2017 1407   MONOABS 0.4 08/04/2023 1445   MONOABS 0.4 04/22/2017 1407   EOSABS 0.2 08/04/2023 1445   EOSABS 0.1 04/22/2017 1407   BASOSABS 0.0 08/04/2023 1445   BASOSABS 0.0 04/22/2017 1407    BMET    Component Value Date/Time   NA 136 08/04/2023 1445   NA 137 04/22/2017 1407   K 4.6 08/04/2023 1445   K 5.0 04/22/2017 1407   CL 102 08/04/2023 1445   CO2 28 08/04/2023 1445   CO2 24 04/22/2017 1407   GLUCOSE 82 08/04/2023 1445   GLUCOSE 77 04/22/2017 1407   BUN 16 08/04/2023 1445   BUN 21.6 04/22/2017 1407   CREATININE 0.60 08/04/2023 1445   CREATININE 0.8 04/22/2017 1407   CALCIUM  9.2 08/04/2023 1445   CALCIUM  9.5 04/22/2017 1407   GFRNONAA >60 08/04/2023 1445   GFRAA >60 12/31/2019 0938    BNP    Component Value Date/Time   BNP 16.5 02/18/2017 1659    ProBNP No results found for: "PROBNP"  Specialty Problems       Pulmonary Problems   Sore throat   Lung nodule   Right lower lobe pulmonary nodule   Lung cancer (HCC)   Dyspnea on exertion    Allergies  Allergen Reactions   Hydrocodone -Acetaminophen  Anaphylaxis    Throat/tongue swells   Penicillins Nausea Only and Rash    Stomach upset Has patient had a PCN reaction causing immediate rash, facial/tongue/throat swelling, SOB or lightheadedness with hypotension: No Has patient had a PCN  reaction causing severe rash involving mucus membranes or skin necrosis: Yes Has patient had a PCN reaction that required hospitalization: NO Has patient had a PCN reaction  occurring within the last 10 years: Yes If all of the above answers are "NO", then may proceed with Cephalosporin use.     Zestril [Lisinopril] Cough   Chlorthalidone     Other Reaction(s): hyponatremia   Latex Itching    Immunization History  Administered Date(s) Administered   Influenza Split 02/13/2020   Influenza,inj,Quad PF,6+ Mos 06/01/2016, 03/15/2018, 04/15/2021   Influenza,inj,quad, With Preservative 05/17/2013   Moderna Sars-Covid-2 Vaccination 01/14/2020, 02/13/2020, 11/10/2020   PNEUMOCOCCAL CONJUGATE-20 05/20/2021   Pfizer Covid-19 Vaccine Bivalent Booster 34yrs & up 05/20/2021   Zoster, Live 10/14/2021    Past Medical History:  Diagnosis Date   ADHD (attention deficit hyperactivity disorder)    Allergies    Anxiety    Aortic atherosclerosis (HCC)    Arthritis    WRIST AND BACK   Asthma    Atrophic vaginitis    CKD (chronic kidney disease), stage II    Complication of anesthesia    slow to wake up   COPD (chronic obstructive pulmonary disease) (HCC)    pt denies this dx   COVID 2022   states it was a bad case, not hospitalized   COVID-19    Depression    Diverticulosis    Dyspnea    Emphysema/COPD (HCC)    Family history of adverse reaction to anesthesia    mother was hard to wake up, acted weird afterwards   Fibromyalgia    Gastric ulcer    GERD (gastroesophageal reflux disease)    History of Barrett's esophagus    History of head and neck cancer    History of kidney stones    Hx of colonic polyps    Hyperlipidemia    Hypertension    Hyponatremia    Insomnia    Lymphoma (HCC)    Lymphoma   Melanosis coli    Neck pain    Osteopenia    Osteoporosis    Plantar fasciitis    Tobacco abuse    Tubular adenoma of colon    Vallecular mass    Varicose veins    Vitamin D  deficiency     Tobacco History: Social History   Tobacco Use  Smoking Status Former   Current packs/day: 0.00   Types: Cigarettes   Start date: 28   Quit date: 2022   Years since quitting: 3.3  Smokeless Tobacco Never   Counseling given: Not Answered   Continue to not smoke  Outpatient Encounter Medications as of 10/13/2023  Medication Sig   amLODipine  (NORVASC ) 5 MG tablet Take 1.5 tablets (7.5 mg total) by mouth daily. (Patient taking differently: Take 7.5 mg by mouth daily. Taking 10mg )   aspirin  EC 81 MG tablet Take 1 tablet (81 mg total) by mouth daily. Swallow whole.   BREZTRI AEROSPHERE 160-9-4.8 MCG/ACT AERO inhaler Inhale 2 puffs into the lungs 2 (two) times daily.   buPROPion  (WELLBUTRIN  SR) 150 MG 12 hr tablet Take 150 mg by mouth in the morning.   Cholecalciferol (DIALYVITE VITAMIN D 5000) 125 MCG (5000 UT) capsule Take 5,000 Units by mouth at bedtime.   Cyanocobalamin (VITAMIN B-12 PO) Take 1 tablet by mouth in the morning.   diclofenac (VOLTAREN) 50 MG EC tablet Take 50 mg by mouth in the morning.   esomeprazole  (NEXIUM ) 40 MG capsule Take 40 mg by mouth daily before breakfast.   fluticasone  (FLONASE ) 50 MCG/ACT nasal spray Place 1 spray into both nostrils daily as needed for allergies or rhinitis.   gabapentin  (NEURONTIN ) 300 MG  capsule Take 1 capsule (300 mg total) by mouth 3 (three) times daily. In the morning and at bedtime   LORazepam  (ATIVAN ) 0.5 MG tablet Take 0.25 mg by mouth 2 (two) times daily as needed for anxiety.   Multiple Vitamin (MULTIVITAMIN WITH MINERALS) TABS tablet Take 1 tablet by mouth in the morning.   potassium chloride  (KLOR-CON  M) 10 MEQ tablet Take 10 mEq by mouth every other day. In the morning   rosuvastatin  (CRESTOR ) 10 MG tablet Take 10 mg by mouth every Monday, Wednesday, and Friday. In the morning   traZODone  (DESYREL ) 50 MG tablet Take 50 mg by mouth at bedtime.   zolpidem  (AMBIEN ) 10 MG tablet Take 10 mg by mouth at bedtime as  needed for sleep.   telmisartan (MICARDIS) 40 MG tablet Take 40 mg by mouth at bedtime. (Patient not taking: Reported on 10/13/2023)   No facility-administered encounter medications on file as of 10/13/2023.     Review of Systems  Review of Systems  N/a  Physical Exam  BP (!) 160/90 (BP Location: Left Arm, Patient Position: Sitting, Cuff Size: Normal)   Pulse 79   Ht 5\' 1"  (1.549 m)   Wt 156 lb 12.8 oz (71.1 kg)   SpO2 94%   BMI 29.63 kg/m   Wt Readings from Last 5 Encounters:  10/13/23 156 lb 12.8 oz (71.1 kg)  08/11/23 152 lb 4.8 oz (69.1 kg)  07/06/23 156 lb 9.6 oz (71 kg)  05/04/23 157 lb (71.2 kg)  04/01/23 157 lb (71.2 kg)    BMI Readings from Last 5 Encounters:  10/13/23 29.63 kg/m  08/11/23 28.78 kg/m  07/06/23 29.59 kg/m  05/04/23 29.66 kg/m  04/01/23 29.66 kg/m     Physical Exam General: Well appearing, in NAD Eyes: EOMI, no icterus ENT: No JVP appreciated, voice is mildly hoarse Respiratory: Distant sounds, no wheeze CV: RRR, no murmurs Abdomen/GI: soft, BS present MSK: No synovitis, no joint effusions Neuro: Normal gait, no weakness Psych: Normal mood, flat affect   Assessment & Plan:   Chronic Cough: Suspect multifactorial. Suspect largest contributors are refractory GERD and aspiration with dysphagia as well as hiatal hernia contributing to refractory reflux.  PFTs also consistent with possible asthma with significant bronchodilator response.  Overall seems improved.    Asthma: Based on bronchodilator response on PFT as well as air trapping.  Mild emphysema also present.  Did not tolerate Breo. Reported headache with Anoro and Incruse, possible LAMA side effect.  However I think these headaches are chronic and not related.  Had intolerable side effects to Serevent .  Was doing okay after bradycardia at last visit.  In the interim placed on Breztri.  Not by me.  Despite this she has worsening dyspnea.  PFTs normal.  Recent parenchymal evaluation  clear on CT scan 07/2023.  I think it is unlikely asthma is causing this.  It is possible.  Will need to consider Biologics.  Dyspnea on exertion: Relatively new.  Pulmonary workup unrevealing, no improvement with maximal inhaler therapy.  Pulmonary hypertension possible with RV dysfunction on prior TTE.  Will message cardiologist for follow-up.  Echocardiogram otherwise look okay.  She has significant stressors with husband ill with cancer.  I suspect this is contributing, stress is contributing.  Right lower lobe lung nodule, adenocarcinoma of lung: Status post VATS 06/2022.  Encouraged ongoing oncology follow-up.  Return in about 6 months (around 04/14/2024) for f/u Dr. Marygrace Snellen.   Guerry Leek, MD 10/13/2023

## 2023-10-13 NOTE — Patient Instructions (Signed)
 Your recent CT scan looks clear  Your breathing test in the past have been normal  It is possible your symptoms are related to asthma but respiratory strong inhaler.  I will send a message to your cardiologist to see if he will reengage with you and make sure were not missing something  Return to clinic in 6 months or sooner as needed with Dr. Marygrace Snellen

## 2023-10-24 DIAGNOSIS — F321 Major depressive disorder, single episode, moderate: Secondary | ICD-10-CM | POA: Diagnosis not present

## 2023-10-24 DIAGNOSIS — F411 Generalized anxiety disorder: Secondary | ICD-10-CM | POA: Diagnosis not present

## 2023-10-24 DIAGNOSIS — F331 Major depressive disorder, recurrent, moderate: Secondary | ICD-10-CM | POA: Diagnosis not present

## 2023-11-24 ENCOUNTER — Other Ambulatory Visit: Payer: Self-pay | Admitting: Family Medicine

## 2023-11-24 DIAGNOSIS — Z1231 Encounter for screening mammogram for malignant neoplasm of breast: Secondary | ICD-10-CM

## 2023-12-06 ENCOUNTER — Encounter: Payer: Self-pay | Admitting: Gastroenterology

## 2023-12-23 ENCOUNTER — Ambulatory Visit
Admission: RE | Admit: 2023-12-23 | Discharge: 2023-12-23 | Disposition: A | Discharge status: Home / Self Care | Source: Ambulatory Visit | Attending: Family Medicine | Admitting: Family Medicine

## 2023-12-23 DIAGNOSIS — Z1231 Encounter for screening mammogram for malignant neoplasm of breast: Secondary | ICD-10-CM

## 2023-12-26 ENCOUNTER — Other Ambulatory Visit: Payer: Self-pay | Admitting: Family Medicine

## 2023-12-26 DIAGNOSIS — R2232 Localized swelling, mass and lump, left upper limb: Secondary | ICD-10-CM

## 2023-12-27 ENCOUNTER — Inpatient Hospital Stay
Admission: RE | Admit: 2023-12-27 | Discharge: 2023-12-27 | Discharge status: Home / Self Care | Source: Ambulatory Visit | Attending: Family Medicine | Admitting: Family Medicine

## 2023-12-27 DIAGNOSIS — R2232 Localized swelling, mass and lump, left upper limb: Secondary | ICD-10-CM

## 2023-12-27 DIAGNOSIS — M79621 Pain in right upper arm: Secondary | ICD-10-CM | POA: Diagnosis not present

## 2023-12-27 DIAGNOSIS — D219 Benign neoplasm of connective and other soft tissue, unspecified: Secondary | ICD-10-CM | POA: Diagnosis not present

## 2024-01-17 DIAGNOSIS — L259 Unspecified contact dermatitis, unspecified cause: Secondary | ICD-10-CM | POA: Diagnosis not present

## 2024-01-30 ENCOUNTER — Other Ambulatory Visit: Payer: BC Managed Care – PPO

## 2024-01-30 ENCOUNTER — Ambulatory Visit (HOSPITAL_COMMUNITY)
Admission: RE | Admit: 2024-01-30 | Discharge: 2024-01-30 | Disposition: A | Discharge status: Home / Self Care | Payer: BC Managed Care – PPO | Source: Ambulatory Visit | Attending: Internal Medicine | Admitting: Internal Medicine

## 2024-01-30 ENCOUNTER — Encounter (HOSPITAL_COMMUNITY): Payer: Self-pay

## 2024-01-30 ENCOUNTER — Inpatient Hospital Stay: Attending: Internal Medicine

## 2024-01-30 DIAGNOSIS — Z79899 Other long term (current) drug therapy: Secondary | ICD-10-CM | POA: Diagnosis not present

## 2024-01-30 DIAGNOSIS — Z902 Acquired absence of lung [part of]: Secondary | ICD-10-CM | POA: Diagnosis not present

## 2024-01-30 DIAGNOSIS — J439 Emphysema, unspecified: Secondary | ICD-10-CM | POA: Diagnosis not present

## 2024-01-30 DIAGNOSIS — C3490 Malignant neoplasm of unspecified part of unspecified bronchus or lung: Secondary | ICD-10-CM | POA: Diagnosis not present

## 2024-01-30 DIAGNOSIS — I700 Atherosclerosis of aorta: Secondary | ICD-10-CM | POA: Diagnosis not present

## 2024-01-30 DIAGNOSIS — R918 Other nonspecific abnormal finding of lung field: Secondary | ICD-10-CM | POA: Insufficient documentation

## 2024-01-30 DIAGNOSIS — Z85118 Personal history of other malignant neoplasm of bronchus and lung: Secondary | ICD-10-CM | POA: Insufficient documentation

## 2024-01-30 LAB — CMP (CANCER CENTER ONLY)
ALT: 12 U/L (ref 0–44)
AST: 10 U/L — ABNORMAL LOW (ref 15–41)
Albumin: 4.1 g/dL (ref 3.5–5.0)
Alkaline Phosphatase: 67 U/L (ref 38–126)
Anion gap: 6 (ref 5–15)
BUN: 15 mg/dL (ref 8–23)
CO2: 28 mmol/L (ref 22–32)
Calcium: 9 mg/dL (ref 8.9–10.3)
Chloride: 105 mmol/L (ref 98–111)
Creatinine: 0.6 mg/dL (ref 0.44–1.00)
GFR, Estimated: >60 mL/min (ref >60)
Glucose, Bld: 66 mg/dL — ABNORMAL LOW (ref 70–99)
Potassium: 4.8 mmol/L (ref 3.5–5.1)
Sodium: 139 mmol/L (ref 135–145)
Total Bilirubin: 0.4 mg/dL (ref 0.0–1.2)
Total Protein: 6.4 g/dL — ABNORMAL LOW (ref 6.5–8.1)

## 2024-01-30 LAB — CBC WITH DIFFERENTIAL (CANCER CENTER ONLY)
Abs Immature Granulocytes: 0.04 K/uL (ref 0.00–0.07)
Basophils Absolute: 0 K/uL (ref 0.0–0.1)
Basophils Relative: 0 %
Eosinophils Absolute: 0.2 K/uL (ref 0.0–0.5)
Eosinophils Relative: 2 %
HCT: 42.6 % (ref 36.0–46.0)
Hemoglobin: 14.1 g/dL (ref 12.0–15.0)
Immature Granulocytes: 0 %
Lymphocytes Relative: 18 %
Lymphs Abs: 1.7 K/uL (ref 0.7–4.0)
MCH: 30.6 pg (ref 26.0–34.0)
MCHC: 33.1 g/dL (ref 30.0–36.0)
MCV: 92.4 fL (ref 80.0–100.0)
Monocytes Absolute: 0.5 K/uL (ref 0.1–1.0)
Monocytes Relative: 6 %
Neutro Abs: 6.7 K/uL (ref 1.7–7.7)
Neutrophils Relative %: 74 %
Platelet Count: 237 K/uL (ref 150–400)
RBC: 4.61 MIL/uL (ref 3.87–5.11)
RDW: 13.5 % (ref 11.5–15.5)
WBC Count: 9.2 K/uL (ref 4.0–10.5)
nRBC: 0 % (ref 0.0–0.2)

## 2024-01-30 MED ORDER — IOHEXOL 300 MG/ML  SOLN
75.0000 mL | Freq: Once | INTRAMUSCULAR | Status: AC | PRN
  Administered 2024-01-30: 75 mL via INTRAVENOUS

## 2024-02-02 DIAGNOSIS — R8290 Unspecified abnormal findings in urine: Secondary | ICD-10-CM | POA: Diagnosis not present

## 2024-02-02 DIAGNOSIS — R350 Frequency of micturition: Secondary | ICD-10-CM | POA: Diagnosis not present

## 2024-02-02 DIAGNOSIS — I10 Essential (primary) hypertension: Secondary | ICD-10-CM | POA: Diagnosis not present

## 2024-02-02 DIAGNOSIS — F419 Anxiety disorder, unspecified: Secondary | ICD-10-CM | POA: Diagnosis not present

## 2024-02-06 ENCOUNTER — Ambulatory Visit: Payer: BC Managed Care – PPO | Admitting: Internal Medicine

## 2024-02-07 ENCOUNTER — Inpatient Hospital Stay (HOSPITAL_BASED_OUTPATIENT_CLINIC_OR_DEPARTMENT_OTHER): Admitting: Internal Medicine

## 2024-02-07 VITALS — BP 149/90 | HR 70 | Temp 97.2°F | Resp 17 | Wt 149.0 lb

## 2024-02-07 DIAGNOSIS — R918 Other nonspecific abnormal finding of lung field: Secondary | ICD-10-CM | POA: Diagnosis not present

## 2024-02-07 DIAGNOSIS — Z79899 Other long term (current) drug therapy: Secondary | ICD-10-CM | POA: Diagnosis not present

## 2024-02-07 DIAGNOSIS — Z902 Acquired absence of lung [part of]: Secondary | ICD-10-CM | POA: Diagnosis not present

## 2024-02-07 DIAGNOSIS — Z85118 Personal history of other malignant neoplasm of bronchus and lung: Secondary | ICD-10-CM | POA: Diagnosis not present

## 2024-02-07 DIAGNOSIS — C3490 Malignant neoplasm of unspecified part of unspecified bronchus or lung: Secondary | ICD-10-CM | POA: Diagnosis not present

## 2024-02-07 NOTE — Progress Notes (Signed)
 [facility] Telephone:([phone]) [phone]   Fax:(336) [phone]  OFFICE PROGRESS NOTE  Leonel Cole, MD [address] [place] [address]  DIAGNOSIS: Stage IA (T1 a, N0, M0) non-small cell lung cancer, adenocarcinoma presented with right lower lobe lung nodule.  with tumor size of 0.8 cm and no evidence for visceral pleural or lymphovascular invasion and the dissected lymph nodes were negative for malignancy.    PRIOR THERAPY: status post right lower lobe superior segmentectomy under the care of Dr. Kerrin on July 02, 2022.    CURRENT THERAPY: Observation.   INTERVAL HISTORY: Emily Santiago 63 y.o. female returns to the clinic today for 73-month follow-up visit accompanied by her husband.Discussed the use of AI scribe software for clinical note transcription with the patient, who gave verbal consent to proceed.  History of Present Illness Emily Santiago is a 63 year old female with stage one non-small cell lung cancer who presents for evaluation with repeat CT scan of the chest for restaging. She is accompanied by her husband, Oneil.  She underwent a right lower lobe superior segmentectomy in January 2024 and is currently in the observation period.  She is experiencing significant back pain, described as 'killing me,' and associated with pressure. Additionally, she is currently dealing with a kidney infection and is on antibiotics prescribed by her family doctor.  No chest pain or breathing issues. She reports that she was fasting and dieting at the time of her blood work.    MEDICAL HISTORY: Past Medical History:  Diagnosis Date   ADHD (attention deficit hyperactivity disorder)    Allergies    Anxiety    Aortic atherosclerosis (HCC)    Arthritis    WRIST AND BACK   Asthma    Atrophic vaginitis    CKD (chronic kidney disease), stage II    Complication of anesthesia    slow to wake up   COPD (chronic obstructive pulmonary disease)  (HCC)    pt denies this dx   COVID 2022   states it was a bad case, not hospitalized   COVID-19    Depression    Diverticulosis    Dyspnea    Emphysema/COPD (HCC)    Family history of adverse reaction to anesthesia    mother was hard to wake up, acted weird afterwards   Fibromyalgia    Gastric ulcer    GERD (gastroesophageal reflux disease)    History of Barrett's esophagus    History of head and neck cancer    History of kidney stones    Hx of colonic polyps    Hyperlipidemia    Hypertension    Hyponatremia    Insomnia    Lymphoma (HCC)    Lymphoma   Melanosis coli    Neck pain    Osteopenia    Osteoporosis    Plantar fasciitis    Tobacco abuse    Tubular adenoma of colon    Vallecular mass    Varicose veins    Vitamin D deficiency     ALLERGIES:  is allergic to hydrocodone -acetaminophen , penicillins, zestril [lisinopril], chlorthalidone, and latex.  MEDICATIONS:  Current Outpatient Medications  Medication Sig Dispense Refill   amLODipine  (NORVASC ) 5 MG tablet Take 1.5 tablets (7.5 mg total) by mouth daily. (Patient taking differently: Take 7.5 mg by mouth daily. Taking 10mg ) 135 tablet 2   aspirin  EC 81 MG tablet Take 1 tablet (81 mg total) by mouth daily. Swallow  whole.     BREZTRI AEROSPHERE 160-9-4.8 MCG/ACT AERO inhaler Inhale 2 puffs into the lungs 2 (two) times daily.     buPROPion  (WELLBUTRIN  SR) 150 MG 12 hr tablet Take 150 mg by mouth in the morning.     Cholecalciferol (DIALYVITE VITAMIN D 5000) 125 MCG (5000 UT) capsule Take 5,000 Units by mouth at bedtime.     Cyanocobalamin  (VITAMIN B-12 PO) Take 1 tablet by mouth in the morning.     diclofenac (VOLTAREN) 50 MG EC tablet Take 50 mg by mouth in the morning.     esomeprazole  (NEXIUM ) 40 MG capsule Take 40 mg by mouth daily before breakfast.     fluticasone  (FLONASE ) 50 MCG/ACT nasal spray Place 1 spray into both nostrils daily as needed for allergies or rhinitis.     gabapentin  (NEURONTIN ) 300 MG capsule  Take 1 capsule (300 mg total) by mouth 3 (three) times daily. In the morning and at bedtime 90 capsule 1   LORazepam  (ATIVAN ) 0.5 MG tablet Take 0.25 mg by mouth 2 (two) times daily as needed for anxiety.     Multiple Vitamin (MULTIVITAMIN WITH MINERALS) TABS tablet Take 1 tablet by mouth in the morning.     potassium chloride  (KLOR-CON  M) 10 MEQ tablet Take 10 mEq by mouth every other day. In the morning     rosuvastatin  (CRESTOR ) 10 MG tablet Take 10 mg by mouth every Monday, Wednesday, and Friday. In the morning     telmisartan (MICARDIS) 40 MG tablet Take 40 mg by mouth at bedtime.     traZODone  (DESYREL ) 50 MG tablet Take 50 mg by mouth at bedtime.     zolpidem  (AMBIEN ) 10 MG tablet Take 10 mg by mouth at bedtime as needed for sleep.     No current facility-administered medications for this visit.    SURGICAL HISTORY:  Past Surgical History:  Procedure Laterality Date   ABDOMINAL HYSTERECTOMY     APPENDECTOMY     BILATERAL SALPINGOOPHORECTOMY     BIOPSY  02/25/2022   Procedure: BIOPSY;  Surgeon: Legrand Victory LITTIE DOUGLAS, MD;  Location: [facility];  Service: Gastroenterology;;   BRONCHIAL BIOPSY  05/03/2022   Procedure: BRONCHIAL BIOPSIES;  Surgeon: Brenna Adine LITTIE, DO;  Location: [facility];  Service: Pulmonary;;   BRONCHIAL BRUSHINGS  05/03/2022   Procedure: BRONCHIAL BRUSHINGS;  Surgeon: Brenna Adine LITTIE, DO;  Location: [facility];  Service: Pulmonary;;   CHOLECYSTECTOMY     COLONOSCOPY WITH PROPOFOL  N/A 02/25/2022   Procedure: COLONOSCOPY WITH PROPOFOL ;  Surgeon: Legrand Victory LITTIE DOUGLAS, MD;  Location: [facility];  Service: Gastroenterology;  Laterality: N/A;   ESOPHAGEAL MANOMETRY N/A 04/28/2016   Procedure: ESOPHAGEAL MANOMETRY (EM);  Surgeon: Gustav LULLA Mcgee, MD;  Location: [facility];  Service: Endoscopy;  Laterality: N/A;  dr. legrand   ESOPHAGOGASTRODUODENOSCOPY (EGD) WITH PROPOFOL  N/A 02/25/2022   Procedure: ESOPHAGOGASTRODUODENOSCOPY (EGD) WITH PROPOFOL ;  Surgeon: Legrand Victory LITTIE DOUGLAS, MD;  Location: [facility];  Service: Gastroenterology;  Laterality: N/A;   FIDUCIAL MARKER PLACEMENT  07/02/2022   Procedure: FIDUCIAL DYE MARKING;  Surgeon: Gladis Leonor HERO, MD;  Location: [facility];  Service: Pulmonary;;   KNEE ARTHROSCOPY     LYMPH NODE BIOPSY     TONSILLECTOMY     UPPER GASTROINTESTINAL ENDOSCOPY     WRIST SURGERY Right    XI ROBOTIC ASSISTED THORACOSCOPY- SEGMENTECTOMY Right 07/02/2022   Procedure: XI ROBOTIC ASSISTED THORACOSCOPY-RIGHT LOWER LOBE SEGMENTECTOMY;  Surgeon: Kerrin Elspeth BROCKS, MD;  Location: [facility];  Service: Thoracic;  Laterality: Right;    REVIEW OF SYSTEMS:  A comprehensive review of systems was negative except for: Constitutional: positive for fatigue   PHYSICAL EXAMINATION: General appearance: alert, cooperative, and no distress Head: Normocephalic, without obvious abnormality, atraumatic Neck: no adenopathy, no JVD, supple, symmetrical, trachea midline, and thyroid  not enlarged, symmetric, no tenderness/mass/nodules Lymph nodes: Cervical, supraclavicular, and axillary nodes normal. Resp: clear to auscultation bilaterally Back: symmetric, no curvature. ROM normal. No CVA tenderness. Cardio: regular rate and rhythm, S1, S2 normal, no murmur, click, rub or gallop GI: soft, non-tender; bowel sounds normal; no masses,  no organomegaly Extremities: extremities normal, atraumatic, no cyanosis or edema  ECOG PERFORMANCE STATUS: 1 - Symptomatic but completely ambulatory  Blood pressure (!) 149/90, pulse 70, temperature (!) 97.2 F (36.2 C), resp. rate 17, weight 149 lb (67.6 kg), SpO2 99%.  LABORATORY DATA: Lab Results  Component Value Date   WBC 9.2 01/30/2024   HGB 14.1 01/30/2024   HCT 42.6 01/30/2024   MCV 92.4 01/30/2024   PLT 237 01/30/2024      Chemistry      Component Value Date/Time   NA 139 01/30/2024 1058   NA 137 04/22/2017 1407   K 4.8 01/30/2024 1058   K 5.0 04/22/2017 1407   CL 105 01/30/2024 1058    CO2 28 01/30/2024 1058   CO2 24 04/22/2017 1407   BUN 15 01/30/2024 1058   BUN 21.6 04/22/2017 1407   CREATININE 0.60 01/30/2024 1058   CREATININE 0.8 04/22/2017 1407      Component Value Date/Time   CALCIUM  9.0 01/30/2024 1058   CALCIUM  9.5 04/22/2017 1407   ALKPHOS 67 01/30/2024 1058   ALKPHOS 91 04/22/2017 1407   AST 10 (L) 01/30/2024 1058   AST 18 04/22/2017 1407   ALT 12 01/30/2024 1058   ALT 16 04/22/2017 1407   BILITOT 0.4 01/30/2024 1058   BILITOT 0.55 04/22/2017 1407       RADIOGRAPHIC STUDIES: CT Chest W Contrast Result Date: 02/01/2024 CLINICAL DATA:  Non-small-cell lung cancer restaging * Tracking Code: BO * EXAM: CT CHEST WITH CONTRAST TECHNIQUE: Multidetector CT imaging of the chest was performed during intravenous contrast administration. RADIATION DOSE REDUCTION: This exam was performed according to the departmental dose-optimization program which includes automated exposure control, adjustment of the mA and/or kV according to patient size and/or use of iterative reconstruction technique. CONTRAST:  75mL OMNIPAQUE  IOHEXOL  300 MG/ML  SOLN COMPARISON:  08/03/2023 FINDINGS: Cardiovascular: Aortic atherosclerosis. Normal heart size. Left coronary artery calcifications. No pericardial effusion. Mediastinum/Nodes: No enlarged mediastinal, hilar, or axillary lymph nodes. Thyroid  gland, trachea, and esophagus demonstrate no significant findings. Lungs/Pleura: Unchanged postoperative appearance of the chest status post right lower lobe wedge resection. Unchanged subpleural nodule of the medial anterior right middle lobe measuring 0.6 cm (series 5, image 107). Unchanged clustered centrilobular and tree-in-bud nodularity in the peripheral inferior right upper lobe (series 5, image 105). Mild paraseptal emphysema. No pleural effusion or pneumothorax. Upper Abdomen: No acute abnormality. Musculoskeletal: No chest wall abnormality. No acute osseous findings. IMPRESSION: 1. Unchanged  postoperative appearance of the chest status post right lower lobe wedge resection. 2. Unchanged subpleural nodule of the medial anterior right middle lobe measuring 0.6 cm. Attention on follow-up. 3. Unchanged clustered centrilobular and tree-in-bud nodularity in the peripheral inferior right upper lobe, consistent with nonspecific atypical infection. 4. Coronary artery disease. 5. Emphysema. Aortic Atherosclerosis (ICD10-I70.0) and Emphysema (ICD10-J43.9). Electronically Signed   By: Marolyn JONETTA Jaksch M.D.   On: 02/01/2024  19:59    ASSESSMENT AND PLAN: This is a very pleasant 63 years old white female with Stage IA (T1 a, N0, M0) non-small cell lung cancer, adenocarcinoma presented with right lower lobe lung nodule.  with tumor size of 0.8 cm and no evidence for visceral pleural or lymphovascular invasion and the dissected lymph nodes were negative for malignancy.  She is status post right lower lobe superior segmentectomy under the care of Dr. Kerrin on July 02, 2022.  She had repeat CT scan of the chest performed recently.  I personally and independently reviewed the scan and discussed the result with the patient today.  Her scan showed no concerning findings for disease progression. Assessment and Plan Assessment & Plan Stage I non-small cell lung cancer status post right lower lobe superior segmentectomy, currently under surveillance Stage I non-small cell lung cancer, status post right lower lobe superior segmentectomy in January 2024. Currently under surveillance with no evidence of recurrence or metastasis on recent CT scan. She is doing well from an oncological perspective. - Continue surveillance with CT scan in 6 months - Schedule follow-up appointment in 6 months - Perform lab work and scan one week prior to the next appointment  Stable right middle lobe pulmonary nodule under observation 6 mm right middle lobe pulmonary nodule, unchanged in size on recent imaging. Continues to be  monitored as part of surveillance strategy. - Continue observation of right middle lobe pulmonary nodule She was advised to call immediately if she has any concerning symptoms in the interval.  The patient voices understanding of current disease status and treatment options and is in agreement with the current care plan.  All questions were answered. The patient knows to call the clinic with any problems, questions or concerns. We can certainly see the patient much sooner if necessary.  The total time spent in the appointment was 20 minutes.  Disclaimer: This note was dictated with voice recognition software. Similar sounding words can inadvertently be transcribed and may not be corrected upon review.

## 2024-02-17 DIAGNOSIS — E663 Overweight: Secondary | ICD-10-CM | POA: Diagnosis not present

## 2024-03-02 DIAGNOSIS — J441 Chronic obstructive pulmonary disease with (acute) exacerbation: Secondary | ICD-10-CM | POA: Diagnosis not present

## 2024-04-23 ENCOUNTER — Encounter (HOSPITAL_COMMUNITY): Payer: Self-pay

## 2024-04-23 ENCOUNTER — Emergency Department (HOSPITAL_COMMUNITY)

## 2024-04-23 ENCOUNTER — Emergency Department (HOSPITAL_COMMUNITY)
Admission: EM | Admit: 2024-04-23 | Discharge: 2024-04-23 | Disposition: A | Discharge status: Home / Self Care | Source: Ambulatory Visit | Attending: Emergency Medicine | Admitting: Emergency Medicine

## 2024-04-23 ENCOUNTER — Other Ambulatory Visit: Payer: Self-pay

## 2024-04-23 DIAGNOSIS — H6501 Acute serous otitis media, right ear: Secondary | ICD-10-CM | POA: Insufficient documentation

## 2024-04-23 DIAGNOSIS — Z79899 Other long term (current) drug therapy: Secondary | ICD-10-CM | POA: Insufficient documentation

## 2024-04-23 DIAGNOSIS — I10 Essential (primary) hypertension: Secondary | ICD-10-CM | POA: Insufficient documentation

## 2024-04-23 DIAGNOSIS — R42 Dizziness and giddiness: Secondary | ICD-10-CM | POA: Insufficient documentation

## 2024-04-23 DIAGNOSIS — J449 Chronic obstructive pulmonary disease, unspecified: Secondary | ICD-10-CM | POA: Insufficient documentation

## 2024-04-23 DIAGNOSIS — Z91040 Latex allergy status: Secondary | ICD-10-CM | POA: Insufficient documentation

## 2024-04-23 DIAGNOSIS — Z7982 Long term (current) use of aspirin: Secondary | ICD-10-CM | POA: Diagnosis not present

## 2024-04-23 DIAGNOSIS — R0602 Shortness of breath: Secondary | ICD-10-CM | POA: Diagnosis not present

## 2024-04-23 DIAGNOSIS — R0609 Other forms of dyspnea: Secondary | ICD-10-CM | POA: Diagnosis not present

## 2024-04-23 DIAGNOSIS — R9431 Abnormal electrocardiogram [ECG] [EKG]: Secondary | ICD-10-CM | POA: Diagnosis not present

## 2024-04-23 DIAGNOSIS — Z85118 Personal history of other malignant neoplasm of bronchus and lung: Secondary | ICD-10-CM | POA: Diagnosis not present

## 2024-04-23 LAB — BASIC METABOLIC PANEL WITH GFR
Anion gap: 11 (ref 5–15)
BUN: 11 mg/dL (ref 8–23)
CO2: 25 mmol/L (ref 22–32)
Calcium: 9.5 mg/dL (ref 8.9–10.3)
Chloride: 102 mmol/L (ref 98–111)
Creatinine, Ser: 0.54 mg/dL (ref 0.44–1.00)
GFR, Estimated: >60 mL/min (ref >60)
Glucose, Bld: 94 mg/dL (ref 70–99)
Potassium: 3.9 mmol/L (ref 3.5–5.1)
Sodium: 139 mmol/L (ref 135–145)

## 2024-04-23 LAB — CBC
HCT: 44.4 % (ref 36.0–46.0)
Hemoglobin: 14.2 g/dL (ref 12.0–15.0)
MCH: 30.1 pg (ref 26.0–34.0)
MCHC: 32 g/dL (ref 30.0–36.0)
MCV: 94.3 fL (ref 80.0–100.0)
Platelets: 222 K/uL (ref 150–400)
RBC: 4.71 MIL/uL (ref 3.87–5.11)
RDW: 12.2 % (ref 11.5–15.5)
WBC: 7.4 K/uL (ref 4.0–10.5)
nRBC: 0 % (ref 0.0–0.2)

## 2024-04-23 LAB — D-DIMER, QUANTITATIVE: D-Dimer, Quant: <0.27 ug{FEU}/mL (ref 0.00–0.50)

## 2024-04-23 LAB — TROPONIN T, HIGH SENSITIVITY
Troponin T High Sensitivity: <15 ng/L (ref 0–19)
Troponin T High Sensitivity: <15 ng/L (ref 0–19)

## 2024-04-23 LAB — PRO BRAIN NATRIURETIC PEPTIDE: Pro Brain Natriuretic Peptide: <50 pg/mL (ref <300.0)

## 2024-04-23 MED ORDER — MECLIZINE HCL 25 MG PO TABS
25.0000 mg | ORAL_TABLET | Freq: Once | ORAL | Status: AC
  Administered 2024-04-23: 25 mg via ORAL
  Filled 2024-04-23: qty 1

## 2024-04-23 MED ORDER — MECLIZINE HCL 25 MG PO TABS: 25.0000 mg | ORAL_TABLET | Freq: Three times a day (TID) | ORAL | 0 refills | Status: AC | PRN

## 2024-04-23 MED ORDER — CEFUROXIME AXETIL 500 MG PO TABS: 500.0000 mg | ORAL_TABLET | Freq: Two times a day (BID) | ORAL | 0 refills | Status: AC

## 2024-04-23 MED ORDER — ONDANSETRON HCL 4 MG PO TABS: 4.0000 mg | ORAL_TABLET | Freq: Four times a day (QID) | ORAL | 0 refills | Status: AC

## 2024-04-23 MED ORDER — CEFUROXIME AXETIL 500 MG PO TABS
500.0000 mg | ORAL_TABLET | Freq: Once | ORAL | Status: AC
  Administered 2024-04-23: 500 mg via ORAL
  Filled 2024-04-23: qty 1

## 2024-04-23 MED ORDER — ONDANSETRON HCL 4 MG/2ML IJ SOLN
4.0000 mg | Freq: Once | INTRAMUSCULAR | Status: AC
Start: 2024-04-23 — End: 2024-04-23
  Administered 2024-04-23: 4 mg via INTRAVENOUS
  Filled 2024-04-23: qty 2

## 2024-04-23 NOTE — ED Notes (Signed)
 First poc with patient. Pt axox4. GCS 15. Pt denies pain.  Awaiting disposition. Notified Dr. Ellouise, stated via secure chat that dimer pending.  Called lab whom stated  was having QC issues, running now, should be resulted in 5 minutes.  MD and patient made aware.  No respiratory distress noted.

## 2024-04-23 NOTE — ED Provider Notes (Signed)
 [place] EMERGENCY DEPARTMENT AT [facility] Provider Note   CSN: [id number] Arrival date & time: 04/23/24  1400     Patient presents with: Dizziness and Shortness of Breath   Emily Santiago is a 63 y.o. female.   Patient is a 63 year old female with a past medical history of hypertension, prior lung cancer in remission, COPD presenting to the emergency department with dizziness and shortness of breath.  Patient states that last night when she tried to get up she had sudden onset of room spinning dizziness with associated nausea and vomiting.  She states that it got better when she laid down and rested but came back every time she tried to get up.  She states that she vomited twice.  She states that for about the last week she has had increased dyspnea on exertion as well as right ear pain.  She states that she has noticed some drainage from her right ear.  She states that she does have a history of ear infections in the past.  She denies any fever or cough.  She states that she did feel like her legs were swelling.  Denies any history of VTE, any recent hospitalization or surgery, any recent long travel in the car or plane or hormone use.  She states that she went to urgent care who told her her EKG was abnormal and recommended she come to the ED for evaluation.   Dizziness Associated symptoms: shortness of breath   Shortness of Breath      Prior to Admission medications   Medication Sig Start Date End Date Taking? Authorizing Provider  cefUROXime (CEFTIN) 500 MG tablet Take 1 tablet (500 mg total) by mouth 2 (two) times daily with a meal for 5 days. 04/23/24 04/28/24 Yes Ellouise, Asad Keeven K, DO  meclizine (ANTIVERT) 25 MG tablet Take 1 tablet (25 mg total) by mouth 3 (three) times daily as needed for dizziness. 04/23/24  Yes Ellouise, Steffani Dionisio K, DO  ondansetron  (ZOFRAN ) 4 MG tablet Take 1 tablet (4 mg total) by mouth every 6 (six) hours. 04/23/24  Yes Ellouise, Chasen Mendell  K, DO  amLODipine  (NORVASC ) 5 MG tablet Take 1.5 tablets (7.5 mg total) by mouth daily. Patient taking differently: Take 7.5 mg by mouth daily. Taking 10mg  04/01/23   Patwardhan, Newman PARAS, MD  aspirin  EC 81 MG tablet Take 1 tablet (81 mg total) by mouth daily. Swallow whole. 05/05/23   Patwardhan, Newman PARAS, MD  BREZTRI AEROSPHERE 160-9-4.8 MCG/ACT AERO inhaler Inhale 2 puffs into the lungs 2 (two) times daily. 05/02/23   [provider]  buPROPion  (WELLBUTRIN  SR) 150 MG 12 hr tablet Take 150 mg by mouth in the morning. 02/12/22   [provider]  Cholecalciferol (DIALYVITE VITAMIN D 5000) 125 MCG (5000 UT) capsule Take 5,000 Units by mouth at bedtime.    [provider]  Cyanocobalamin  (VITAMIN B-12 PO) Take 1 tablet by mouth in the morning.    [provider]  diclofenac (VOLTAREN) 50 MG EC tablet Take 50 mg by mouth in the morning. 06/20/22   [provider]  esomeprazole  (NEXIUM ) 40 MG capsule Take 40 mg by mouth daily before breakfast. 01/14/22   [provider]  fluticasone  (FLONASE ) 50 MCG/ACT nasal spray Place 1 spray into both nostrils daily as needed for allergies or rhinitis.    [provider]  gabapentin  (NEURONTIN ) 300 MG capsule Take 1 capsule (300 mg total) by mouth 3 (three) times daily. In the morning and  at bedtime 07/20/22   Kerrin Elspeth BROCKS, MD  LORazepam  (ATIVAN ) 0.5 MG tablet Take 0.25 mg by mouth 2 (two) times daily as needed for anxiety.    [provider]  Multiple Vitamin (MULTIVITAMIN WITH MINERALS) TABS tablet Take 1 tablet by mouth in the morning.    [provider]  potassium chloride  (KLOR-CON  M) 10 MEQ tablet Take 10 mEq by mouth every other day. In the morning    [provider]  rosuvastatin  (CRESTOR ) 10 MG tablet Take 10 mg by mouth every Monday, Wednesday, and Friday. In the morning 02/05/22   [provider]  telmisartan (MICARDIS) 40 MG tablet Take 40 mg by mouth at  bedtime.    [provider]  traZODone  (DESYREL ) 50 MG tablet Take 50 mg by mouth at bedtime. 04/30/22   [provider]  zolpidem  (AMBIEN ) 10 MG tablet Take 10 mg by mouth at bedtime as needed for sleep.    [provider]    Allergies: Hydrocodone -acetaminophen , Penicillins, Zestril [lisinopril], Chlorthalidone, and Latex    Review of Systems  Respiratory:  Positive for shortness of breath.   Neurological:  Positive for dizziness.    Updated Vital Signs BP (!) 145/89 (BP Location: Left Arm)   Pulse 79   Temp (!) 97.5 F (36.4 C) (Oral)   Resp 18   Ht 5' 1 (1.549 m)   Wt 67.6 kg   SpO2 97%   BMI 28.16 kg/m   Physical Exam Vitals and nursing note reviewed.  Constitutional:      General: She is not in acute distress.    Appearance: She is well-developed.  HENT:     Head: Normocephalic and atraumatic.     Right Ear: Ear canal and external ear normal. A middle ear effusion is present.     Left Ear: Tympanic membrane, ear canal and external ear normal.     Mouth/Throat:     Mouth: Mucous membranes are moist.     Pharynx: Oropharynx is clear.  Eyes:     Extraocular Movements: Extraocular movements intact.     Pupils: Pupils are equal, round, and reactive to light.     Comments: No nystagmus  Cardiovascular:     Rate and Rhythm: Normal rate and regular rhythm.     Heart sounds: Normal heart sounds.  Pulmonary:     Effort: Pulmonary effort is normal.     Breath sounds: Normal breath sounds.  Abdominal:     Palpations: Abdomen is soft.     Tenderness: There is no abdominal tenderness.  Musculoskeletal:        General: Normal range of motion.     Cervical back: Normal range of motion.     Right lower leg: No edema.     Left lower leg: No edema.  Skin:    General: Skin is warm and dry.  Neurological:     General: No focal deficit present.     Mental Status: She is alert and oriented to person, place, and time.     Cranial Nerves: No cranial  nerve deficit.     Motor: No weakness.     Comments: Sensation intact in all 4 extremities Normal finger-to-nose bilaterally  Psychiatric:        Mood and Affect: Mood normal.        Behavior: Behavior normal.     (all labs ordered are listed, but only abnormal results are displayed) Labs Reviewed  BASIC METABOLIC PANEL WITH GFR  CBC  D-DIMER, QUANTITATIVE  PRO BRAIN NATRIURETIC PEPTIDE  TROPONIN T, HIGH SENSITIVITY  TROPONIN T, HIGH SENSITIVITY    EKG: EKG Interpretation Date/Time:  Monday April 23 2024 14:08:53 EST Ventricular Rate:  71 PR Interval:  163 QRS Duration:  98 QT Interval:  386 QTC Calculation: 420 R Axis:   72  Text Interpretation: Sinus rhythm Baseline wander in lead(s) I III aVL Confirmed by Ula Barter [phone]) on 04/23/2024 2:20:33 PM  Radiology: DG Chest 2 View Result Date: 04/23/2024 CLINICAL DATA:  Dizziness.  Shortness of breath. EXAM: CHEST - 2 VIEW COMPARISON:  08/31/2022 FINDINGS: The heart size and mediastinal contours are within normal limits. Pleural-parenchymal scarring and surgical staples seen in the right lower lobe, without change. The lungs are otherwise clear. Stable wedge compression deformity of vertebral body again seen at the thoracolumbar junction. IMPRESSION: No active cardiopulmonary disease. Electronically Signed   By: Norleen DELENA Kil M.D.   On: 04/23/2024 15:14     Procedures   Medications Ordered in the ED  ondansetron  (ZOFRAN ) injection 4 mg (4 mg Intravenous Given 04/23/24 1755)  meclizine (ANTIVERT) tablet 25 mg (25 mg Oral Given 04/23/24 1755)  cefUROXime (CEFTIN) tablet 500 mg (500 mg Oral Given 04/23/24 1800)    Clinical Course as of 04/23/24 2009  Mon Apr 23, 2024  2000 D-dimer negative making PE unlikely. Reassuring work up patient is stable for discharge home with outpatient follow up. [VK]    Clinical Course User Index [VK] Kingsley, Ketara Cavness K, DO                                 Medical Decision  Making This patient presents to the ED with chief complaint(s) of dizziness, ear pain, shortness of breath with pertinent past medical history of hypertension, COPD, prior lung cancer in remission which further complicates the presenting complaint. The complaint involves an extensive differential diagnosis and also carries with it a high risk of complications and morbidity.    The differential diagnosis includes arrhythmia, anemia, dehydration, electrolyte abnormality, peripheral vertigo, central vertigo less likely as no focal neurologic deficits, ACS, pneumonia, pneumothorax pulmonary edema, pleural effusion, viral syndrome, otitis media or externa, considering PE but low risk by Wells criteria  Additional history obtained: Additional history obtained from N/A Records reviewed outpatient oncology records  ED Course and Reassessment: On patient's arrival she is hemodynamically stable in no acute distress.  Had EKG, labs and chest x-ray initiated in triage.  EKG showed no acute ischemic changes, labs are within normal range.  Chest x-ray shows no acute disease.  Will have D-dimer and BNP to further evaluate for etiology of her shortness of breath.  Suspect peripheral vertigo based on symptoms and evidence of otitis media on exam.  Will treat with Ceftin, meclizine and Zofran  and will be closely reassessed.  Independent labs interpretation:  The following labs were independently interpreted: within normal range  Independent visualization of imaging: - I independently visualized the following imaging with scope of interpretation limited to determining acute life threatening conditions related to emergency care: CXR, which revealed no acute disease  Consultation: - Consulted or discussed management/test interpretation w/ external professional: N/A  Consideration for admission or further workup: Patient has no emergent conditions requiring admission or further work-up at this time and is stable for  discharge home with primary care follow-up  Social Determinants of health: N/A    Amount and/or Complexity of Data Reviewed Labs: ordered.  Radiology: ordered.  Risk Prescription drug management.       Final diagnoses:  Vertigo  Non-recurrent acute serous otitis media of right ear  Dyspnea on exertion    ED Discharge Orders          Ordered    cefUROXime (CEFTIN) 500 MG tablet  2 times daily with meals        04/23/24 2007    ondansetron  (ZOFRAN ) 4 MG tablet  Every 6 hours        04/23/24 2007    meclizine (ANTIVERT) 25 MG tablet  3 times daily PRN        04/23/24 2007               Kingsley, Gurtaj Ruz K, DO 04/23/24 2009

## 2024-04-23 NOTE — ED Notes (Signed)
 Pt ambulatory to the restroom with visitor

## 2024-04-23 NOTE — ED Notes (Signed)
 Sent urine to lab (if needed)

## 2024-04-23 NOTE — Discharge Instructions (Signed)
 You were seen in the emergency department for your dizziness, ear pain and shortness of breath.  Your dizziness appeared consistent with vertigo and it did appear that you have an inner ear infection which likely prompted the vertigo.  I have given you a prescription of antibiotics you should complete this as prescribed.  You can take meclizine as needed for dizziness and Zofran  as needed for nausea.  Your workup showed no other complications with your heart or lungs and you can follow-up with your primary doctor in the next few days to have your symptoms rechecked.  You should return to the emergency department if you having fevers despite the antibiotics, numbness or weakness in your arms or legs, you are unable to walk, you have worsening shortness of breath, you have severe chest pain or if you have any other new or concerning symptoms.

## 2024-04-23 NOTE — ED Triage Notes (Signed)
 Pt reports with dizziness and shob since last night. Pt reports that the room feels like its spinning. Pt states that she was told to come here by UC. Pt also has right ear pain and was told she has fluid on it.

## 2024-04-23 NOTE — ED Notes (Signed)
 Pt axox4. Gcs 15. Pt and spouse of patient verbalize understanding of discharge instructions, follow up and new rx. Pt ambulated out of er with steady gait to transportation home with spouse

## 2024-04-24 DIAGNOSIS — R42 Dizziness and giddiness: Secondary | ICD-10-CM | POA: Diagnosis not present

## 2024-04-27 ENCOUNTER — Telehealth: Payer: Self-pay

## 2024-04-27 NOTE — Telephone Encounter (Signed)
 Patient called and left a voicemail in regards to the Lung Cancer Awareness event.  Informed her of the date and time for Thursday, November 20 th from 530-730.  She voiced understanding.

## 2024-05-02 DIAGNOSIS — H6690 Otitis media, unspecified, unspecified ear: Secondary | ICD-10-CM | POA: Diagnosis not present

## 2024-05-14 ENCOUNTER — Encounter: Payer: Self-pay | Admitting: Pulmonary Disease

## 2024-05-14 ENCOUNTER — Ambulatory Visit: Admitting: Pulmonary Disease

## 2024-05-14 VITALS — BP 138/80 | HR 74 | Temp 98.1°F | Ht 60.0 in | Wt 148.2 lb

## 2024-05-14 DIAGNOSIS — J432 Centrilobular emphysema: Secondary | ICD-10-CM | POA: Diagnosis not present

## 2024-05-14 MED ORDER — BREZTRI AEROSPHERE 160-9-4.8 MCG/ACT IN AERO: 2.0000 | INHALATION_SPRAY | Freq: Two times a day (BID) | RESPIRATORY_TRACT | 12 refills | Status: AC

## 2024-05-14 NOTE — Progress Notes (Signed)
 Patient ID: Emily Santiago, female    DOB: 1961/06/01, 63 y.o.   MRN: [id number]  Chief Complaint  Patient presents with   Shortness of Breath    6 month follow up    Referring provider: Leonel Cole, MD  HPI:   Emily Santiago is a 63 y.o. woman with PMH tobacco abuse, emphysema, HTN whom we are seeing in follow up of cough, DOE, lung nodule with diagnosis of adenocarcinoma following robotic navigational bronchoscopy 05/03/2022 s/p VATS 07/02/2022.  Most recent oncology office note reviewed.  Returns for follow-up.  The past results were Breztri helped much.  She continues Breztri.  She feels her breathing doing fine.  Had been more dyspneic at last visit.  May be seasonal variations.  No real complaints today.  Otherwise doing well.  HPI at initial visit: History obtained per patient and mother at bedside who frequently interjects.  Patient notes cough is actually been present for many years.  She thinks cough is not worsened over the last 6 months or so.  In the past, she attributed to her smoking.  She reportedly has had many episodes of bronchitis. She has been told she has COPD based on her symptoms and smoking. Never had PFTs. Requires prednisone everythime she has bronchitis. At least once a year.  2 courses this calendar year already to date.   Cough is daily. Worse when eating and drinking. Gets choked on water. Endorses sensation of solids getting stuck in throat. Cough worse when lying supine. Cough is non-productive. Endorses occasional heartburn despite PPI and H2 blocker. Endorses refractory reflux. Reports she has a hiatal hernia. Had curse of abx with no improvement. Uses Breo and thinks helps cough mildly and gives a little more breath.  She is an every day smoker. 2 ppd for about 40 years. Interested in quitting but seems more pre-contemplative at the moment.   Prior imaging reviewed and interpreted as: CXR 10/2019 - hyperinflated, no infiltrate; CT chest 03/2018 -  emphysema, clear lungs.   PMH: HTN, GERD, emphysema Surgical history: Endoscopy, hysterectomy, cholecystectomy Family History: CAD, COPD in father, COPD in brother Social History: every day smoker, lives in [place]  Questionaires / Pulmonary Flowsheets:   ACT:      No data to display          MMRC:     No data to display          Epworth:      No data to display          Tests:   FENO:  No results found for: NITRICOXIDE  PFT:    Latest Ref Rng & Units 06/29/2022   10:29 AM 03/26/2020    2:56 PM  PFT Results  FVC-Pre L 2.37  P 2.21   FVC-Predicted Pre % 85  P 74   FVC-Post L 2.54  P 2.49   FVC-Predicted Post % 91  P 84   Pre FEV1/FVC % % 69  P 70   Post FEV1/FCV % % 71  P 72   FEV1-Pre L 1.64  P 1.55   FEV1-Predicted Pre % 77  P 67   FEV1-Post L 1.79  P 1.78   DLCO uncorrected ml/min/mmHg 15.14  P 14.63   DLCO UNC% % 86  P 80   DLCO corrected ml/min/mmHg  14.63   DLCO COR %Predicted %  80   DLVA Predicted % 89  P 81   TLC L 4.53  P 4.73  TLC % Predicted % 101  P 102   RV % Predicted % 114  P 136     P Preliminary result  Personally reviewed and interpreted as spirometry digestive restriction versus air trapping, significant bronchodilator response after all.  Lung volumes within normal limits with the exception of evidence of air trapping.  DLCO within normal limits.  WALK:      No data to display          Imaging: Personally reviewed and as per EMR discussion this note DG Chest 2 View Result Date: 04/23/2024 CLINICAL DATA:  Dizziness.  Shortness of breath. EXAM: CHEST - 2 VIEW COMPARISON:  08/31/2022 FINDINGS: The heart size and mediastinal contours are within normal limits. Pleural-parenchymal scarring and surgical staples seen in the right lower lobe, without change. The lungs are otherwise clear. Stable wedge compression deformity of vertebral body again seen at the thoracolumbar junction. IMPRESSION: No active cardiopulmonary  disease. Electronically Signed   By: Norleen DELENA Kil M.D.   On: 04/23/2024 15:14    Lab Results: Personally reviewed CBC    Component Value Date/Time   WBC 7.4 04/23/2024 1415   RBC 4.71 04/23/2024 1415   HGB 14.2 04/23/2024 1415   HGB 14.1 01/30/2024 1058   HGB 13.4 04/22/2017 1407   HCT 44.4 04/23/2024 1415   HCT 40.5 04/22/2017 1407   PLT 222 04/23/2024 1415   PLT 237 01/30/2024 1058   PLT 219 04/22/2017 1407   MCV 94.3 04/23/2024 1415   MCV 95.1 04/22/2017 1407   MCH 30.1 04/23/2024 1415   MCHC 32.0 04/23/2024 1415   RDW 12.2 04/23/2024 1415   RDW 13.4 04/22/2017 1407   LYMPHSABS 1.7 01/30/2024 1058   LYMPHSABS 1.7 04/22/2017 1407   MONOABS 0.5 01/30/2024 1058   MONOABS 0.4 04/22/2017 1407   EOSABS 0.2 01/30/2024 1058   EOSABS 0.1 04/22/2017 1407   BASOSABS 0.0 01/30/2024 1058   BASOSABS 0.0 04/22/2017 1407    BMET    Component Value Date/Time   NA 139 04/23/2024 1415   NA 137 04/22/2017 1407   K 3.9 04/23/2024 1415   K 5.0 04/22/2017 1407   CL 102 04/23/2024 1415   CO2 25 04/23/2024 1415   CO2 24 04/22/2017 1407   GLUCOSE 94 04/23/2024 1415   GLUCOSE 77 04/22/2017 1407   BUN 11 04/23/2024 1415   BUN 21.6 04/22/2017 1407   CREATININE 0.54 04/23/2024 1415   CREATININE 0.60 01/30/2024 1058   CREATININE 0.8 04/22/2017 1407   CALCIUM  9.5 04/23/2024 1415   CALCIUM  9.5 04/22/2017 1407   GFRNONAA >60 04/23/2024 1415   GFRNONAA >60 01/30/2024 1058   GFRAA >60 12/31/2019 0938    BNP    Component Value Date/Time   BNP 16.5 02/18/2017 1659    ProBNP    Component Value Date/Time   PROBNP <50.0 04/23/2024 1813    Specialty Problems       Pulmonary Problems   Sore throat   Lung nodule   Right lower lobe pulmonary nodule   Lung cancer (HCC)   Dyspnea on exertion    Allergies  Allergen Reactions   Hydrocodone -Acetaminophen  Anaphylaxis    Throat/tongue swells   Penicillins Nausea Only and Rash    Stomach upset Has patient had a PCN reaction  causing immediate rash, facial/tongue/throat swelling, SOB or lightheadedness with hypotension: No Has patient had a PCN reaction causing severe rash involving mucus membranes or skin necrosis: Yes Has patient had a PCN reaction that required hospitalization: NO  Has patient had a PCN reaction occurring within the last 10 years: Yes If all of the above answers are NO, then may proceed with Cephalosporin use.     Zestril [Lisinopril] Cough   Chlorthalidone     Other Reaction(s): hyponatremia   Latex Itching    Immunization History  Administered Date(s) Administered   Influenza Split 02/13/2020   Influenza, Seasonal, Injecte, Preservative Fre 02/23/2023   Influenza,inj,Quad PF,6+ Mos 06/01/2016, 03/15/2018, 04/15/2021   Influenza,inj,quad, With Preservative 05/17/2013   Moderna Sars-Covid-2 Vaccination 01/14/2020, 02/13/2020, 11/10/2020   PNEUMOCOCCAL CONJUGATE-20 05/20/2021   Pfizer Covid-19 Vaccine Bivalent Booster 2yrs & up 05/20/2021   Zoster, Live 10/14/2021    Past Medical History:  Diagnosis Date   ADHD (attention deficit hyperactivity disorder)    Allergies    Anxiety    Aortic atherosclerosis    Arthritis    WRIST AND BACK   Asthma    Atrophic vaginitis    CKD (chronic kidney disease), stage II    Complication of anesthesia    slow to wake up   COPD (chronic obstructive pulmonary disease) (HCC)    pt denies this dx   COVID 2022   states it was a bad case, not hospitalized   COVID-19    Depression    Diverticulosis    Dyspnea    Emphysema/COPD    Family history of adverse reaction to anesthesia    mother was hard to wake up, acted weird afterwards   Fibromyalgia    Gastric ulcer    GERD (gastroesophageal reflux disease)    History of Barrett's esophagus    History of head and neck cancer    History of kidney stones    Hx of colonic polyps    Hyperlipidemia    Hypertension    Hyponatremia    Insomnia    Lymphoma (HCC)    Lymphoma   Melanosis coli     Neck pain    Osteopenia    Osteoporosis    Plantar fasciitis    Tobacco abuse    Tubular adenoma of colon    Vallecular mass    Varicose veins    Vitamin D deficiency     Tobacco History: Social History   Tobacco Use  Smoking Status Former   Current packs/day: 0.00   Types: Cigarettes   Start date: 1983   Quit date: 2022   Years since quitting: 3.9  Smokeless Tobacco Never   Counseling given: Not Answered   Continue to not smoke  Outpatient Encounter Medications as of 05/14/2024  Medication Sig   amLODipine  (NORVASC ) 5 MG tablet Take 1.5 tablets (7.5 mg total) by mouth daily.   aspirin  EC 81 MG tablet Take 1 tablet (81 mg total) by mouth daily. Swallow whole.   buPROPion  (WELLBUTRIN  SR) 150 MG 12 hr tablet Take 150 mg by mouth in the morning.   Cholecalciferol (DIALYVITE VITAMIN D 5000) 125 MCG (5000 UT) capsule Take 5,000 Units by mouth at bedtime.   Cyanocobalamin  (VITAMIN B-12 PO) Take 1 tablet by mouth in the morning.   diclofenac (VOLTAREN) 50 MG EC tablet Take 50 mg by mouth in the morning.   esomeprazole  (NEXIUM ) 40 MG capsule Take 40 mg by mouth daily before breakfast.   fluticasone  (FLONASE ) 50 MCG/ACT nasal spray Place 1 spray into both nostrils daily as needed for allergies or rhinitis.   gabapentin  (NEURONTIN ) 300 MG capsule Take 1 capsule (300 mg total) by mouth 3 (three) times daily. In the morning and  at bedtime   LORazepam  (ATIVAN ) 0.5 MG tablet Take 0.25 mg by mouth 2 (two) times daily as needed for anxiety.   meclizine  (ANTIVERT ) 25 MG tablet Take 1 tablet (25 mg total) by mouth 3 (three) times daily as needed for dizziness.   Multiple Vitamin (MULTIVITAMIN WITH MINERALS) TABS tablet Take 1 tablet by mouth in the morning.   ondansetron  (ZOFRAN ) 4 MG tablet Take 1 tablet (4 mg total) by mouth every 6 (six) hours.   potassium chloride  (KLOR-CON  M) 10 MEQ tablet Take 10 mEq by mouth every other day. In the morning   rosuvastatin  (CRESTOR ) 10 MG tablet Take  10 mg by mouth every Monday, Wednesday, and Friday. In the morning   telmisartan (MICARDIS) 40 MG tablet Take 40 mg by mouth at bedtime.   traZODone  (DESYREL ) 50 MG tablet Take 50 mg by mouth at bedtime.   zolpidem  (AMBIEN ) 10 MG tablet Take 10 mg by mouth at bedtime as needed for sleep.   [DISCONTINUED] BREZTRI  AEROSPHERE 160-9-4.8 MCG/ACT AERO inhaler Inhale 2 puffs into the lungs 2 (two) times daily.   BREZTRI  AEROSPHERE 160-9-4.8 MCG/ACT AERO inhaler Inhale 2 puffs into the lungs 2 (two) times daily.   No facility-administered encounter medications on file as of 05/14/2024.     Review of Systems  Review of Systems  N/a  Physical Exam  BP 138/80   Pulse 74   Temp 98.1 F (36.7 C) (Oral)   Ht 5' (1.524 m)   Wt 148 lb 3.2 oz (67.2 kg)   SpO2 97%   BMI 28.94 kg/m   Wt Readings from Last 5 Encounters:  05/14/24 148 lb 3.2 oz (67.2 kg)  04/23/24 149 lb 0.5 oz (67.6 kg)  02/07/24 149 lb (67.6 kg)  10/13/23 156 lb 12.8 oz (71.1 kg)  08/11/23 152 lb 4.8 oz (69.1 kg)    BMI Readings from Last 5 Encounters:  05/14/24 28.94 kg/m  04/23/24 28.16 kg/m  02/07/24 28.15 kg/m  10/13/23 29.63 kg/m  08/11/23 28.78 kg/m     Physical Exam General: Petite, in chair Eyes: No icterus ENT: Clear voice Respiratory: Clear, normal work of breathing CV: Warm, no edema Abdomen/GI: Nondistended   Assessment & Plan:   Chronic Cough: Now no longer much of a problem.  Related to chronic aspiration, refractory GERD on aspiration, hiatal hernia reflux.  Asthma: Based on bronchodilator response on PFT as well as air trapping.  Mild emphysema also present.  Has not tolerated Breo.  Anoro and Incruse caused headache.  Although headaches persisted after discontinuation.  Eventually escalated to Breztri  with improvement in symptoms.  Breztri  refilled.  Dyspnea on exertion: Relatively new.  Pulmonary workup unrevealing, overall improved with triple inhaled therapy.   Return in about 1  year (around 05/14/2025).   Emily JONELLE Beals, MD 05/14/2024

## 2024-05-14 NOTE — Patient Instructions (Signed)
 I refilled Breztri  No other changes  Glad you are doing well  Return to clinic in 1 year or sooner as needed with Dr. Annella

## 2024-06-11 DIAGNOSIS — F419 Anxiety disorder, unspecified: Secondary | ICD-10-CM | POA: Diagnosis not present

## 2024-06-11 DIAGNOSIS — Z23 Encounter for immunization: Secondary | ICD-10-CM | POA: Diagnosis not present

## 2024-06-11 DIAGNOSIS — R7309 Other abnormal glucose: Secondary | ICD-10-CM | POA: Diagnosis not present

## 2024-06-11 DIAGNOSIS — G2581 Restless legs syndrome: Secondary | ICD-10-CM | POA: Diagnosis not present

## 2024-06-11 DIAGNOSIS — G4700 Insomnia, unspecified: Secondary | ICD-10-CM | POA: Diagnosis not present

## 2024-06-11 DIAGNOSIS — E7800 Pure hypercholesterolemia, unspecified: Secondary | ICD-10-CM | POA: Diagnosis not present

## 2024-06-11 DIAGNOSIS — Z0000 Encounter for general adult medical examination without abnormal findings: Secondary | ICD-10-CM | POA: Diagnosis not present

## 2024-06-11 DIAGNOSIS — I10 Essential (primary) hypertension: Secondary | ICD-10-CM | POA: Diagnosis not present

## 2024-07-11 ENCOUNTER — Telehealth: Payer: Self-pay | Admitting: Medical Oncology

## 2024-07-11 NOTE — Telephone Encounter (Signed)
 Pt reports, I hurt my lung and I need assistance. Pt states that approximately 3 hours prior, her husband was picking her up when she heard a pop on the right anterior chest.   Pt reports her mother told her she saw a knot in the same area. Pt was instructed to contact her PCP for further evaluation. Pts husband verbalized understanding and stated they will call the PCP.  I reminded her to schedule her CT scan .   Next appt 02/24.

## 2024-07-13 ENCOUNTER — Ambulatory Visit
Admission: RE | Admit: 2024-07-13 | Discharge: 2024-07-13 | Disposition: A | Source: Ambulatory Visit | Attending: Internal Medicine

## 2024-07-13 ENCOUNTER — Other Ambulatory Visit: Payer: Self-pay | Admitting: Internal Medicine

## 2024-07-13 DIAGNOSIS — R0789 Other chest pain: Secondary | ICD-10-CM

## 2024-07-31 ENCOUNTER — Inpatient Hospital Stay

## 2024-07-31 ENCOUNTER — Ambulatory Visit (HOSPITAL_COMMUNITY)

## 2024-08-07 ENCOUNTER — Inpatient Hospital Stay: Admitting: Internal Medicine

## 2024-08-28 ENCOUNTER — Other Ambulatory Visit (HOSPITAL_COMMUNITY)
